# Patient Record
Sex: Male | Born: 1950
Health system: Southern US, Community
[De-identification: ages and names within clinical notes are randomized; demographics above are authoritative.]

## PROBLEM LIST (undated history)

## (undated) DIAGNOSIS — Z720 Tobacco use: Secondary | ICD-10-CM

## (undated) DIAGNOSIS — J439 Emphysema, unspecified: Secondary | ICD-10-CM

## (undated) DIAGNOSIS — I2510 Atherosclerotic heart disease of native coronary artery without angina pectoris: Secondary | ICD-10-CM

## (undated) DIAGNOSIS — F1020 Alcohol dependence, uncomplicated: Secondary | ICD-10-CM

## (undated) DIAGNOSIS — J449 Chronic obstructive pulmonary disease, unspecified: Secondary | ICD-10-CM

## (undated) DIAGNOSIS — I10 Essential (primary) hypertension: Secondary | ICD-10-CM

## (undated) DIAGNOSIS — E785 Hyperlipidemia, unspecified: Secondary | ICD-10-CM

## (undated) DIAGNOSIS — F329 Major depressive disorder, single episode, unspecified: Secondary | ICD-10-CM

## (undated) DIAGNOSIS — I219 Acute myocardial infarction, unspecified: Secondary | ICD-10-CM

## (undated) DIAGNOSIS — I639 Cerebral infarction, unspecified: Secondary | ICD-10-CM

## (undated) DIAGNOSIS — B019 Varicella without complication: Secondary | ICD-10-CM

## (undated) DIAGNOSIS — F1010 Alcohol abuse, uncomplicated: Secondary | ICD-10-CM

## (undated) DIAGNOSIS — Z9289 Personal history of other medical treatment: Secondary | ICD-10-CM

## (undated) DIAGNOSIS — I214 Non-ST elevation (NSTEMI) myocardial infarction: Secondary | ICD-10-CM

## (undated) DIAGNOSIS — K760 Fatty (change of) liver, not elsewhere classified: Secondary | ICD-10-CM

## (undated) DIAGNOSIS — F32A Depression, unspecified: Secondary | ICD-10-CM

## (undated) DIAGNOSIS — D696 Thrombocytopenia, unspecified: Secondary | ICD-10-CM

## (undated) DIAGNOSIS — A630 Anogenital (venereal) warts: Secondary | ICD-10-CM

## (undated) DIAGNOSIS — B1920 Unspecified viral hepatitis C without hepatic coma: Secondary | ICD-10-CM

## (undated) HISTORY — DX: Anogenital (venereal) warts: A63.0

## (undated) HISTORY — DX: Thrombocytopenia, unspecified: D69.6

## (undated) HISTORY — DX: Personal history of other medical treatment: Z92.89

## (undated) HISTORY — DX: Non-ST elevation (NSTEMI) myocardial infarction: I21.4

## (undated) HISTORY — DX: Fatty (change of) liver, not elsewhere classified: K76.0

## (undated) HISTORY — DX: Alcohol dependence, uncomplicated: F10.20

## (undated) HISTORY — DX: Essential (primary) hypertension: I10

## (undated) HISTORY — DX: Acute myocardial infarction, unspecified: I21.9

## (undated) HISTORY — DX: Hyperlipidemia, unspecified: E78.5

## (undated) HISTORY — DX: Cerebral infarction, unspecified: I63.9

## (undated) HISTORY — DX: Varicella without complication: B01.9

---
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
## 2010-05-15 ENCOUNTER — Inpatient Hospital Stay (INDEPENDENT_AMBULATORY_CARE_PROVIDER_SITE_OTHER)
Admission: RE | Admit: 2010-05-15 | Discharge: 2010-05-15 | Disposition: A | Discharge status: Home / Self Care | Payer: Commercial Managed Care - PPO | Source: Ambulatory Visit | Attending: Family Medicine | Admitting: Family Medicine

## 2010-05-15 ENCOUNTER — Observation Stay (HOSPITAL_COMMUNITY)
Admission: EM | Admit: 2010-05-15 | Discharge: 2010-05-16 | Disposition: A | Discharge status: Home / Self Care | Payer: Commercial Managed Care - PPO | Attending: Emergency Medicine | Admitting: Emergency Medicine

## 2010-05-15 ENCOUNTER — Emergency Department (HOSPITAL_COMMUNITY): Payer: Commercial Managed Care - PPO

## 2010-05-15 DIAGNOSIS — R0609 Other forms of dyspnea: Secondary | ICD-10-CM | POA: Insufficient documentation

## 2010-05-15 DIAGNOSIS — R5383 Other fatigue: Secondary | ICD-10-CM | POA: Insufficient documentation

## 2010-05-15 DIAGNOSIS — R5381 Other malaise: Secondary | ICD-10-CM | POA: Insufficient documentation

## 2010-05-15 DIAGNOSIS — J449 Chronic obstructive pulmonary disease, unspecified: Secondary | ICD-10-CM | POA: Insufficient documentation

## 2010-05-15 DIAGNOSIS — F329 Major depressive disorder, single episode, unspecified: Secondary | ICD-10-CM | POA: Insufficient documentation

## 2010-05-15 DIAGNOSIS — R079 Chest pain, unspecified: Principal | ICD-10-CM | POA: Insufficient documentation

## 2010-05-15 DIAGNOSIS — F3289 Other specified depressive episodes: Secondary | ICD-10-CM | POA: Insufficient documentation

## 2010-05-15 DIAGNOSIS — F17200 Nicotine dependence, unspecified, uncomplicated: Secondary | ICD-10-CM | POA: Insufficient documentation

## 2010-05-15 DIAGNOSIS — J4489 Other specified chronic obstructive pulmonary disease: Secondary | ICD-10-CM | POA: Insufficient documentation

## 2010-05-15 DIAGNOSIS — R0989 Other specified symptoms and signs involving the circulatory and respiratory systems: Secondary | ICD-10-CM | POA: Insufficient documentation

## 2010-05-15 DIAGNOSIS — N509 Disorder of male genital organs, unspecified: Secondary | ICD-10-CM

## 2010-05-15 LAB — POCT I-STAT 3, VENOUS BLOOD GAS (G3P V)
Acid-Base Excess: 2 mmol/L (ref 0.0–2.0)
Bicarbonate: 28.1 mEq/L — ABNORMAL HIGH (ref 20.0–24.0)
O2 Saturation: 76 %
TCO2: 30 mmol/L (ref 0–100)
pCO2, Ven: 49.5 mmHg (ref 45.0–50.0)
pH, Ven: 7.362 — ABNORMAL HIGH (ref 7.250–7.300)
pO2, Ven: 43 mmHg (ref 30.0–45.0)

## 2010-05-15 LAB — COMPREHENSIVE METABOLIC PANEL
ALT: 98 U/L — ABNORMAL HIGH (ref 0–53)
AST: 90 U/L — ABNORMAL HIGH (ref 0–37)
Albumin: 3.5 g/dL (ref 3.5–5.2)
Alkaline Phosphatase: 92 U/L (ref 39–117)
BUN: 16 mg/dL (ref 6–23)
CO2: 27 mEq/L (ref 19–32)
Calcium: 9.1 mg/dL (ref 8.4–10.5)
Chloride: 106 mEq/L (ref 96–112)
Creatinine, Ser: 0.89 mg/dL (ref 0.4–1.5)
GFR calc Af Amer: >60 mL/min (ref >60)
GFR calc non Af Amer: >60 mL/min (ref >60)
Glucose, Bld: 99 mg/dL (ref 70–99)
Potassium: 4.1 mEq/L (ref 3.5–5.1)
Sodium: 139 mEq/L (ref 135–145)
Total Bilirubin: 0.4 mg/dL (ref 0.3–1.2)
Total Protein: 6.9 g/dL (ref 6.0–8.3)

## 2010-05-15 LAB — TSH: TSH: 0.196 u[IU]/mL — ABNORMAL LOW (ref 0.350–4.500)

## 2010-05-15 LAB — POCT CARDIAC MARKERS
CKMB, poc: 1.9 ng/mL (ref 1.0–8.0)
CKMB, poc: 1.4 ng/mL (ref 1.0–8.0)
CKMB, poc: 1.4 ng/mL (ref 1.0–8.0)
Myoglobin, poc: 96.7 ng/mL (ref 12–200)
Myoglobin, poc: 94 ng/mL (ref 12–200)
Myoglobin, poc: 83.2 ng/mL (ref 12–200)
Troponin i, poc: <0.05 ng/mL (ref 0.00–0.09)
Troponin i, poc: <0.05 ng/mL (ref 0.00–0.09)
Troponin i, poc: <0.05 ng/mL (ref 0.00–0.09)

## 2010-05-15 LAB — CBC
HCT: 45 % (ref 39.0–52.0)
Hemoglobin: 16.2 g/dL (ref 13.0–17.0)
MCH: 35.4 pg — ABNORMAL HIGH (ref 26.0–34.0)
MCHC: 36 g/dL (ref 30.0–36.0)
MCV: 98.5 fL (ref 78.0–100.0)
Platelets: 115 10*3/uL — ABNORMAL LOW (ref 150–400)
RBC: 4.57 MIL/uL (ref 4.22–5.81)
RDW: 13.5 % (ref 11.5–15.5)
WBC: 6.2 10*3/uL (ref 4.0–10.5)

## 2010-05-16 ENCOUNTER — Encounter: Payer: Self-pay | Admitting: Internal Medicine

## 2010-05-16 DIAGNOSIS — R072 Precordial pain: Secondary | ICD-10-CM

## 2010-08-30 ENCOUNTER — Emergency Department (HOSPITAL_COMMUNITY)
Admission: EM | Admit: 2010-08-30 | Discharge: 2010-08-31 | Disposition: A | Discharge status: Home / Self Care | Payer: Commercial Managed Care - PPO | Attending: Emergency Medicine | Admitting: Emergency Medicine

## 2010-08-30 DIAGNOSIS — F329 Major depressive disorder, single episode, unspecified: Secondary | ICD-10-CM | POA: Insufficient documentation

## 2010-08-30 DIAGNOSIS — F1010 Alcohol abuse, uncomplicated: Secondary | ICD-10-CM | POA: Insufficient documentation

## 2010-08-30 DIAGNOSIS — R945 Abnormal results of liver function studies: Secondary | ICD-10-CM | POA: Insufficient documentation

## 2010-08-30 DIAGNOSIS — F3289 Other specified depressive episodes: Secondary | ICD-10-CM | POA: Insufficient documentation

## 2010-08-30 DIAGNOSIS — Z79899 Other long term (current) drug therapy: Secondary | ICD-10-CM | POA: Insufficient documentation

## 2010-08-30 LAB — URINALYSIS, ROUTINE W REFLEX MICROSCOPIC
Bilirubin Urine: NEGATIVE
Glucose, UA: NEGATIVE mg/dL
Hgb urine dipstick: NEGATIVE
Ketones, ur: NEGATIVE mg/dL
Leukocytes, UA: NEGATIVE
Nitrite: NEGATIVE
Protein, ur: NEGATIVE mg/dL
Specific Gravity, Urine: 1.017 (ref 1.005–1.030)
Urobilinogen, UA: 0.2 mg/dL (ref 0.0–1.0)
pH: 5.5 (ref 5.0–8.0)

## 2010-08-30 LAB — CBC
HCT: 43.1 % (ref 39.0–52.0)
Hemoglobin: 15.5 g/dL (ref 13.0–17.0)
MCH: 35.5 pg — ABNORMAL HIGH (ref 26.0–34.0)
MCHC: 36 g/dL (ref 30.0–36.0)
MCV: 98.6 fL (ref 78.0–100.0)
Platelets: 128 10*3/uL — ABNORMAL LOW (ref 150–400)
RBC: 4.37 MIL/uL (ref 4.22–5.81)
RDW: 13.8 % (ref 11.5–15.5)
WBC: 6.5 10*3/uL (ref 4.0–10.5)

## 2010-08-30 LAB — DIFFERENTIAL
Basophils Absolute: 0 10*3/uL (ref 0.0–0.1)
Basophils Relative: 1 % (ref 0–1)
Eosinophils Absolute: 0.2 10*3/uL (ref 0.0–0.7)
Eosinophils Relative: 3 % (ref 0–5)
Lymphocytes Relative: 43 % (ref 12–46)
Lymphs Abs: 2.8 10*3/uL (ref 0.7–4.0)
Monocytes Absolute: 0.4 10*3/uL (ref 0.1–1.0)
Monocytes Relative: 7 % (ref 3–12)
Neutro Abs: 3.1 10*3/uL (ref 1.7–7.7)
Neutrophils Relative %: 47 % (ref 43–77)

## 2010-08-30 LAB — RAPID URINE DRUG SCREEN, HOSP PERFORMED
Amphetamines: NOT DETECTED
Barbiturates: NOT DETECTED
Benzodiazepines: NOT DETECTED
Cocaine: NOT DETECTED
Opiates: NOT DETECTED
Tetrahydrocannabinol: NOT DETECTED

## 2010-08-31 LAB — COMPREHENSIVE METABOLIC PANEL
ALT: 77 U/L — ABNORMAL HIGH (ref 0–53)
AST: 91 U/L — ABNORMAL HIGH (ref 0–37)
Albumin: 3.3 g/dL — ABNORMAL LOW (ref 3.5–5.2)
Alkaline Phosphatase: 142 U/L — ABNORMAL HIGH (ref 39–117)
BUN: 10 mg/dL (ref 6–23)
CO2: 27 mEq/L (ref 19–32)
Calcium: 8.4 mg/dL (ref 8.4–10.5)
Chloride: 105 mEq/L (ref 96–112)
Creatinine, Ser: 0.85 mg/dL (ref 0.50–1.35)
GFR calc Af Amer: >60 mL/min (ref >60)
GFR calc non Af Amer: >60 mL/min (ref >60)
Glucose, Bld: 131 mg/dL — ABNORMAL HIGH (ref 70–99)
Potassium: 4.3 mEq/L (ref 3.5–5.1)
Sodium: 142 mEq/L (ref 135–145)
Total Bilirubin: 0.2 mg/dL — ABNORMAL LOW (ref 0.3–1.2)
Total Protein: 6.9 g/dL (ref 6.0–8.3)

## 2010-08-31 LAB — ETHANOL: Alcohol, Ethyl (B): 184 mg/dL — ABNORMAL HIGH (ref 0–11)

## 2011-07-16 ENCOUNTER — Emergency Department (HOSPITAL_COMMUNITY)
Admission: EM | Admit: 2011-07-16 | Discharge: 2011-07-17 | Disposition: A | Discharge status: Home / Self Care | Payer: Self-pay | Attending: Emergency Medicine | Admitting: Emergency Medicine

## 2011-07-16 ENCOUNTER — Emergency Department (HOSPITAL_COMMUNITY): Payer: Self-pay

## 2011-07-16 ENCOUNTER — Encounter (HOSPITAL_COMMUNITY): Payer: Self-pay | Admitting: Emergency Medicine

## 2011-07-16 DIAGNOSIS — F1910 Other psychoactive substance abuse, uncomplicated: Secondary | ICD-10-CM | POA: Insufficient documentation

## 2011-07-16 DIAGNOSIS — M79609 Pain in unspecified limb: Secondary | ICD-10-CM | POA: Insufficient documentation

## 2011-07-16 DIAGNOSIS — R748 Abnormal levels of other serum enzymes: Secondary | ICD-10-CM

## 2011-07-16 DIAGNOSIS — F329 Major depressive disorder, single episode, unspecified: Secondary | ICD-10-CM | POA: Insufficient documentation

## 2011-07-16 DIAGNOSIS — S42002A Fracture of unspecified part of left clavicle, initial encounter for closed fracture: Secondary | ICD-10-CM

## 2011-07-16 DIAGNOSIS — Z8619 Personal history of other infectious and parasitic diseases: Secondary | ICD-10-CM | POA: Insufficient documentation

## 2011-07-16 DIAGNOSIS — F3289 Other specified depressive episodes: Secondary | ICD-10-CM | POA: Insufficient documentation

## 2011-07-16 DIAGNOSIS — R7401 Elevation of levels of liver transaminase levels: Secondary | ICD-10-CM | POA: Insufficient documentation

## 2011-07-16 DIAGNOSIS — R7402 Elevation of levels of lactic acid dehydrogenase (LDH): Secondary | ICD-10-CM | POA: Insufficient documentation

## 2011-07-16 DIAGNOSIS — IMO0002 Reserved for concepts with insufficient information to code with codable children: Secondary | ICD-10-CM | POA: Insufficient documentation

## 2011-07-16 DIAGNOSIS — S066XAA Traumatic subarachnoid hemorrhage with loss of consciousness status unknown, initial encounter: Secondary | ICD-10-CM | POA: Insufficient documentation

## 2011-07-16 DIAGNOSIS — S42009A Fracture of unspecified part of unspecified clavicle, initial encounter for closed fracture: Secondary | ICD-10-CM | POA: Insufficient documentation

## 2011-07-16 DIAGNOSIS — S0990XA Unspecified injury of head, initial encounter: Secondary | ICD-10-CM

## 2011-07-16 DIAGNOSIS — F1911 Other psychoactive substance abuse, in remission: Secondary | ICD-10-CM

## 2011-07-16 DIAGNOSIS — S066X9A Traumatic subarachnoid hemorrhage with loss of consciousness of unspecified duration, initial encounter: Secondary | ICD-10-CM

## 2011-07-16 DIAGNOSIS — R040 Epistaxis: Secondary | ICD-10-CM | POA: Insufficient documentation

## 2011-07-16 DIAGNOSIS — R911 Solitary pulmonary nodule: Secondary | ICD-10-CM | POA: Insufficient documentation

## 2011-07-16 HISTORY — DX: Major depressive disorder, single episode, unspecified: F32.9

## 2011-07-16 HISTORY — DX: Depression, unspecified: F32.A

## 2011-07-16 HISTORY — DX: Unspecified viral hepatitis C without hepatic coma: B19.20

## 2011-07-16 LAB — URINALYSIS, ROUTINE W REFLEX MICROSCOPIC
Bilirubin Urine: NEGATIVE
Glucose, UA: NEGATIVE mg/dL
Hgb urine dipstick: NEGATIVE
Ketones, ur: NEGATIVE mg/dL
Leukocytes, UA: NEGATIVE
Nitrite: NEGATIVE
Protein, ur: NEGATIVE mg/dL
Specific Gravity, Urine: 1.03 (ref 1.005–1.030)
Urobilinogen, UA: 0.2 mg/dL (ref 0.0–1.0)
pH: 5 (ref 5.0–8.0)

## 2011-07-16 LAB — CBC
HCT: 44.7 % (ref 39.0–52.0)
Hemoglobin: 16.1 g/dL (ref 13.0–17.0)
MCH: 36.2 pg — ABNORMAL HIGH (ref 26.0–34.0)
MCHC: 36 g/dL (ref 30.0–36.0)
MCV: 100.4 fL — ABNORMAL HIGH (ref 78.0–100.0)
Platelets: 105 10*3/uL — ABNORMAL LOW (ref 150–400)
RBC: 4.45 MIL/uL (ref 4.22–5.81)
RDW: 13.4 % (ref 11.5–15.5)
WBC: 6.3 10*3/uL (ref 4.0–10.5)

## 2011-07-16 LAB — COMPREHENSIVE METABOLIC PANEL
ALT: 180 U/L — ABNORMAL HIGH (ref 0–53)
AST: 186 U/L — ABNORMAL HIGH (ref 0–37)
Albumin: 3.2 g/dL — ABNORMAL LOW (ref 3.5–5.2)
Alkaline Phosphatase: 130 U/L — ABNORMAL HIGH (ref 39–117)
BUN: 11 mg/dL (ref 6–23)
CO2: 20 mEq/L (ref 19–32)
Calcium: 8.3 mg/dL — ABNORMAL LOW (ref 8.4–10.5)
Chloride: 106 mEq/L (ref 96–112)
Creatinine, Ser: 0.86 mg/dL (ref 0.50–1.35)
GFR calc Af Amer: >90 mL/min (ref >90)
GFR calc non Af Amer: >90 mL/min (ref >90)
Glucose, Bld: 115 mg/dL — ABNORMAL HIGH (ref 70–99)
Potassium: 4.1 mEq/L (ref 3.5–5.1)
Sodium: 139 mEq/L (ref 135–145)
Total Bilirubin: 0.5 mg/dL (ref 0.3–1.2)
Total Protein: 7.1 g/dL (ref 6.0–8.3)

## 2011-07-16 LAB — DIFFERENTIAL
Basophils Absolute: 0 10*3/uL (ref 0.0–0.1)
Basophils Relative: 1 % (ref 0–1)
Eosinophils Absolute: 0.1 10*3/uL (ref 0.0–0.7)
Eosinophils Relative: 2 % (ref 0–5)
Lymphocytes Relative: 54 % — ABNORMAL HIGH (ref 12–46)
Lymphs Abs: 3.4 10*3/uL (ref 0.7–4.0)
Monocytes Absolute: 0.4 10*3/uL (ref 0.1–1.0)
Monocytes Relative: 6 % (ref 3–12)
Neutro Abs: 2.3 10*3/uL (ref 1.7–7.7)
Neutrophils Relative %: 37 % — ABNORMAL LOW (ref 43–77)

## 2011-07-16 LAB — GLUCOSE, CAPILLARY: Glucose-Capillary: 117 mg/dL — ABNORMAL HIGH (ref 70–99)

## 2011-07-16 MED ORDER — OXYCODONE-ACETAMINOPHEN 5-325 MG PO TABS
2.0000 | ORAL_TABLET | Freq: Once | ORAL | Status: AC
  Administered 2011-07-16: 2 via ORAL
  Filled 2011-07-16: qty 2

## 2011-07-16 MED ORDER — IOHEXOL 300 MG/ML  SOLN
100.0000 mL | Freq: Once | INTRAMUSCULAR | Status: AC | PRN
  Administered 2011-07-16: 100 mL via INTRAVENOUS

## 2011-07-16 MED ORDER — TRAMADOL HCL 50 MG PO TABS: 50.0000 mg | ORAL_TABLET | Freq: Four times a day (QID) | ORAL | Status: DC | PRN

## 2011-07-16 MED ORDER — TRAMADOL HCL 50 MG PO TABS: 50.0000 mg | ORAL_TABLET | Freq: Four times a day (QID) | ORAL | Status: AC | PRN

## 2011-07-16 MED ORDER — TETANUS-DIPHTH-ACELL PERTUSSIS 5-2.5-18.5 LF-MCG/0.5 IM SUSP
0.5000 mL | Freq: Once | INTRAMUSCULAR | Status: AC
  Administered 2011-07-16: 0.5 mL via INTRAMUSCULAR
  Filled 2011-07-16: qty 0.5

## 2011-07-16 NOTE — Progress Notes (Signed)
Orthopedic Tech Progress Note Patient Details:  Hunter Mcmillan 1950/04/14 [id number]  Patient ID: [name], male   DOB: 1951-02-02, 61 y.o.   MRN: [id number] Made trauma visit  Nikki Dom 07/16/2011, 7:10 PM

## 2011-07-16 NOTE — ED Notes (Signed)
PT returned from CT. [employer] at bedside; pt alert and oriented; no signs of distress.

## 2011-07-16 NOTE — ED Notes (Signed)
LAC noted to forehead
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
## 2011-07-16 NOTE — ED Notes (Signed)
Ortho paged. 

## 2011-07-16 NOTE — ED Notes (Signed)
Patient transported to CT 

## 2011-07-16 NOTE — Discharge Instructions (Signed)
You have had a head injury which does not appear to require admission at this time. A concussion is a state of changed mental ability from trauma. SEEK IMMEDIATE MEDICAL ATTENTION IF: There is confusion or drowsiness (although children frequently become drowsy after injury).  You cannot awaken the injured person.  There is nausea (feeling sick to your stomach) or continued, forceful vomiting.  You notice dizziness or unsteadiness which is getting worse, or inability to walk.  You have convulsions or unconsciousness.  (Do not take aspirin as this impairs clotting abilities). Take other pain medications only as directed.  You cannot use arms or legs normally.  There are changes in pupil sizes. (This is the black center in the colored part of the eye)  There is clear or bloody discharge from the nose or ears.  Change in speech, vision, swallowing, or understanding.  Localized weakness, numbness, tingling, or change in bowel or bladder control.

## 2011-07-16 NOTE — ED Notes (Signed)
PT attempted to provide urine sample; pt became verbally inappropriate with staff. Urinal left with pt.

## 2012-01-02 DIAGNOSIS — I2510 Atherosclerotic heart disease of native coronary artery without angina pectoris: Secondary | ICD-10-CM

## 2012-01-02 HISTORY — DX: Atherosclerotic heart disease of native coronary artery without angina pectoris: I25.10

## 2012-01-05 ENCOUNTER — Inpatient Hospital Stay (HOSPITAL_COMMUNITY)
Admission: EM | Admit: 2012-01-05 | Discharge: 2012-01-07 | DRG: 282 | Disposition: A | Discharge status: Home / Self Care | Payer: Commercial Managed Care - PPO | Attending: Cardiology | Admitting: Cardiology

## 2012-01-05 ENCOUNTER — Inpatient Hospital Stay (HOSPITAL_COMMUNITY): Payer: Self-pay

## 2012-01-05 ENCOUNTER — Encounter (HOSPITAL_COMMUNITY): Payer: Self-pay | Admitting: *Deleted

## 2012-01-05 ENCOUNTER — Emergency Department (HOSPITAL_COMMUNITY): Payer: Self-pay

## 2012-01-05 ENCOUNTER — Encounter (HOSPITAL_COMMUNITY)
Admission: EM | Disposition: A | Discharge status: Home / Self Care | Payer: Self-pay | Source: Home / Self Care | Attending: Cardiology

## 2012-01-05 DIAGNOSIS — I252 Old myocardial infarction: Secondary | ICD-10-CM

## 2012-01-05 DIAGNOSIS — F1010 Alcohol abuse, uncomplicated: Secondary | ICD-10-CM | POA: Diagnosis present

## 2012-01-05 DIAGNOSIS — F339 Major depressive disorder, recurrent, unspecified: Secondary | ICD-10-CM

## 2012-01-05 DIAGNOSIS — Z8249 Family history of ischemic heart disease and other diseases of the circulatory system: Secondary | ICD-10-CM

## 2012-01-05 DIAGNOSIS — R079 Chest pain, unspecified: Secondary | ICD-10-CM

## 2012-01-05 DIAGNOSIS — B182 Chronic viral hepatitis C: Secondary | ICD-10-CM

## 2012-01-05 DIAGNOSIS — I2510 Atherosclerotic heart disease of native coronary artery without angina pectoris: Secondary | ICD-10-CM

## 2012-01-05 DIAGNOSIS — F3289 Other specified depressive episodes: Secondary | ICD-10-CM | POA: Diagnosis present

## 2012-01-05 DIAGNOSIS — Z720 Tobacco use: Secondary | ICD-10-CM

## 2012-01-05 DIAGNOSIS — F329 Major depressive disorder, single episode, unspecified: Secondary | ICD-10-CM | POA: Diagnosis present

## 2012-01-05 DIAGNOSIS — D696 Thrombocytopenia, unspecified: Secondary | ICD-10-CM

## 2012-01-05 DIAGNOSIS — I214 Non-ST elevation (NSTEMI) myocardial infarction: Principal | ICD-10-CM

## 2012-01-05 DIAGNOSIS — B1920 Unspecified viral hepatitis C without hepatic coma: Secondary | ICD-10-CM | POA: Diagnosis present

## 2012-01-05 DIAGNOSIS — F17200 Nicotine dependence, unspecified, uncomplicated: Secondary | ICD-10-CM | POA: Diagnosis present

## 2012-01-05 HISTORY — PX: LEFT HEART CATHETERIZATION WITH CORONARY ANGIOGRAM: SHX5451

## 2012-01-05 HISTORY — DX: Alcohol abuse, uncomplicated: F10.10

## 2012-01-05 HISTORY — DX: Atherosclerotic heart disease of native coronary artery without angina pectoris: I25.10

## 2012-01-05 HISTORY — DX: Tobacco use: Z72.0

## 2012-01-05 LAB — URINALYSIS, ROUTINE W REFLEX MICROSCOPIC
Glucose, UA: NEGATIVE mg/dL
Hgb urine dipstick: NEGATIVE
Ketones, ur: 15 mg/dL — AB
Leukocytes, UA: NEGATIVE
Nitrite: NEGATIVE
Protein, ur: NEGATIVE mg/dL
Specific Gravity, Urine: >1.046 — ABNORMAL HIGH (ref 1.005–1.030)
Urobilinogen, UA: 1 mg/dL (ref 0.0–1.0)
pH: 5 (ref 5.0–8.0)

## 2012-01-05 LAB — HEPATIC FUNCTION PANEL
ALT: 157 U/L — ABNORMAL HIGH (ref 0–53)
AST: 157 U/L — ABNORMAL HIGH (ref 0–37)
Albumin: 3.3 g/dL — ABNORMAL LOW (ref 3.5–5.2)
Alkaline Phosphatase: 109 U/L (ref 39–117)
Bilirubin, Direct: 0.2 mg/dL (ref 0.0–0.3)
Indirect Bilirubin: 0.3 mg/dL (ref 0.3–0.9)
Total Bilirubin: 0.5 mg/dL (ref 0.3–1.2)
Total Protein: 7.8 g/dL (ref 6.0–8.3)

## 2012-01-05 LAB — POCT I-STAT TROPONIN I: Troponin i, poc: 0.13 ng/mL (ref 0.00–0.08)

## 2012-01-05 LAB — TROPONIN I
Troponin I: 0.36 ng/mL (ref <0.30)
Troponin I: 0.32 ng/mL (ref <0.30)

## 2012-01-05 LAB — CBC
HCT: 45.8 % (ref 39.0–52.0)
Hemoglobin: 16.8 g/dL (ref 13.0–17.0)
MCH: 36.6 pg — ABNORMAL HIGH (ref 26.0–34.0)
MCHC: 36.7 g/dL — ABNORMAL HIGH (ref 30.0–36.0)
MCV: 99.8 fL (ref 78.0–100.0)
Platelets: 100 10*3/uL — ABNORMAL LOW (ref 150–400)
RBC: 4.59 MIL/uL (ref 4.22–5.81)
RDW: 13.5 % (ref 11.5–15.5)
WBC: 5 10*3/uL (ref 4.0–10.5)

## 2012-01-05 LAB — BASIC METABOLIC PANEL
BUN: 7 mg/dL (ref 6–23)
CO2: 25 mEq/L (ref 19–32)
Calcium: 9.2 mg/dL (ref 8.4–10.5)
Chloride: 102 mEq/L (ref 96–112)
Creatinine, Ser: 0.78 mg/dL (ref 0.50–1.35)
GFR calc Af Amer: >90 mL/min (ref >90)
GFR calc non Af Amer: >90 mL/min (ref >90)
Glucose, Bld: 141 mg/dL — ABNORMAL HIGH (ref 70–99)
Potassium: 4.4 mEq/L (ref 3.5–5.1)
Sodium: 138 mEq/L (ref 135–145)

## 2012-01-05 LAB — RAPID URINE DRUG SCREEN, HOSP PERFORMED
Amphetamines: NOT DETECTED
Barbiturates: NOT DETECTED
Benzodiazepines: NOT DETECTED
Cocaine: NOT DETECTED
Opiates: POSITIVE — AB
Tetrahydrocannabinol: NOT DETECTED

## 2012-01-05 LAB — PROTIME-INR
INR: 1.06 (ref 0.00–1.49)
Prothrombin Time: 13.7 seconds (ref 11.6–15.2)

## 2012-01-05 LAB — ETHANOL: Alcohol, Ethyl (B): 90 mg/dL — ABNORMAL HIGH (ref 0–11)

## 2012-01-05 LAB — GLUCOSE, CAPILLARY: Glucose-Capillary: 123 mg/dL — ABNORMAL HIGH (ref 70–99)

## 2012-01-05 LAB — MRSA PCR SCREENING: MRSA by PCR: NEGATIVE

## 2012-01-05 SURGERY — LEFT HEART CATHETERIZATION WITH CORONARY ANGIOGRAM
Anesthesia: LOCAL

## 2012-01-05 MED ORDER — SODIUM CHLORIDE 0.9 % IJ SOLN
3.0000 mL | Freq: Two times a day (BID) | INTRAMUSCULAR | Status: DC
  Administered 2012-01-06 – 2012-01-07 (×3): 3 mL via INTRAVENOUS

## 2012-01-05 MED ORDER — LORAZEPAM 2 MG/ML IJ SOLN: 1.0000 mg | Freq: Four times a day (QID) | INTRAMUSCULAR | Status: DC | PRN

## 2012-01-05 MED ORDER — ATORVASTATIN CALCIUM 10 MG PO TABS
10.0000 mg | ORAL_TABLET | Freq: Every day | ORAL | Status: DC
  Administered 2012-01-05 – 2012-01-06 (×2): 10 mg via ORAL
  Filled 2012-01-05 (×3): qty 1

## 2012-01-05 MED ORDER — LORAZEPAM 0.5 MG PO TABS: 1.0000 mg | ORAL_TABLET | Freq: Four times a day (QID) | ORAL | Status: DC | PRN

## 2012-01-05 MED ORDER — LIDOCAINE HCL (PF) 1 % IJ SOLN
INTRAMUSCULAR | Status: AC
  Filled 2012-01-05: qty 30

## 2012-01-05 MED ORDER — MORPHINE SULFATE 4 MG/ML IJ SOLN
4.0000 mg | Freq: Once | INTRAMUSCULAR | Status: AC
  Administered 2012-01-05: 4 mg via INTRAVENOUS
  Filled 2012-01-05: qty 1

## 2012-01-05 MED ORDER — ACETAMINOPHEN 325 MG PO TABS: 650.0000 mg | ORAL_TABLET | ORAL | Status: DC | PRN

## 2012-01-05 MED ORDER — SODIUM CHLORIDE 0.9 % IV SOLN: INTRAVENOUS | Status: AC

## 2012-01-05 MED ORDER — SODIUM CHLORIDE 0.9 % IJ SOLN: 3.0000 mL | INTRAMUSCULAR | Status: DC | PRN

## 2012-01-05 MED ORDER — ONDANSETRON HCL 4 MG/2ML IJ SOLN: 4.0000 mg | Freq: Four times a day (QID) | INTRAMUSCULAR | Status: DC | PRN

## 2012-01-05 MED ORDER — ASPIRIN EC 81 MG PO TBEC
81.0000 mg | DELAYED_RELEASE_TABLET | Freq: Every day | ORAL | Status: DC
  Administered 2012-01-06 – 2012-01-07 (×2): 81 mg via ORAL
  Filled 2012-01-05 (×2): qty 1

## 2012-01-05 MED ORDER — VERAPAMIL HCL 2.5 MG/ML IV SOLN
INTRAVENOUS | Status: AC
  Filled 2012-01-05: qty 2

## 2012-01-05 MED ORDER — THIAMINE HCL 100 MG/ML IJ SOLN
100.0000 mg | Freq: Every day | INTRAMUSCULAR | Status: DC
  Filled 2012-01-05 (×3): qty 1

## 2012-01-05 MED ORDER — ONDANSETRON HCL 4 MG/2ML IJ SOLN
4.0000 mg | Freq: Once | INTRAMUSCULAR | Status: AC
  Administered 2012-01-05: 4 mg via INTRAVENOUS
  Filled 2012-01-05: qty 2

## 2012-01-05 MED ORDER — HEPARIN (PORCINE) IN NACL 100-0.45 UNIT/ML-% IJ SOLN
900.0000 [IU]/h | INTRAMUSCULAR | Status: DC
  Filled 2012-01-05: qty 250

## 2012-01-05 MED ORDER — SODIUM CHLORIDE 0.9 % IV SOLN: 250.0000 mL | INTRAVENOUS | Status: DC | PRN

## 2012-01-05 MED ORDER — NITROGLYCERIN 0.2 MG/ML ON CALL CATH LAB
INTRAVENOUS | Status: AC
  Filled 2012-01-05: qty 1

## 2012-01-05 MED ORDER — MIDAZOLAM HCL 2 MG/2ML IJ SOLN
INTRAMUSCULAR | Status: AC
  Filled 2012-01-05: qty 2

## 2012-01-05 MED ORDER — CLOPIDOGREL BISULFATE 75 MG PO TABS
75.0000 mg | ORAL_TABLET | Freq: Every day | ORAL | Status: DC
  Administered 2012-01-06 – 2012-01-07 (×2): 75 mg via ORAL
  Filled 2012-01-05 (×3): qty 1

## 2012-01-05 MED ORDER — SODIUM CHLORIDE 0.9 % IV BOLUS (SEPSIS)
500.0000 mL | Freq: Once | INTRAVENOUS | Status: AC
  Administered 2012-01-05: 500 mL via INTRAVENOUS

## 2012-01-05 MED ORDER — HEPARIN (PORCINE) IN NACL 100-0.45 UNIT/ML-% IJ SOLN
900.0000 [IU]/h | INTRAMUSCULAR | Status: DC
  Administered 2012-01-06: 900 [IU]/h via INTRAVENOUS
  Filled 2012-01-05: qty 250

## 2012-01-05 MED ORDER — FENTANYL CITRATE 0.05 MG/ML IJ SOLN
INTRAMUSCULAR | Status: AC
  Filled 2012-01-05: qty 2

## 2012-01-05 MED ORDER — NITROGLYCERIN 0.4 MG SL SUBL
0.4000 mg | SUBLINGUAL_TABLET | SUBLINGUAL | Status: AC | PRN
  Administered 2012-01-05 (×3): 0.4 mg via SUBLINGUAL
  Filled 2012-01-05 (×2): qty 25

## 2012-01-05 MED ORDER — VITAMIN B-1 100 MG PO TABS
100.0000 mg | ORAL_TABLET | Freq: Every day | ORAL | Status: DC
  Administered 2012-01-05 – 2012-01-07 (×3): 100 mg via ORAL
  Filled 2012-01-05 (×3): qty 1

## 2012-01-05 MED ORDER — ADULT MULTIVITAMIN W/MINERALS CH
1.0000 | ORAL_TABLET | Freq: Every day | ORAL | Status: DC
  Administered 2012-01-05 – 2012-01-07 (×3): 1 via ORAL
  Filled 2012-01-05 (×3): qty 1

## 2012-01-05 MED ORDER — SODIUM CHLORIDE 0.9 % IJ SOLN
3.0000 mL | Freq: Two times a day (BID) | INTRAMUSCULAR | Status: DC
  Administered 2012-01-06: 3 mL via INTRAVENOUS

## 2012-01-05 MED ORDER — CLOPIDOGREL BISULFATE 300 MG PO TABS
ORAL_TABLET | ORAL | Status: AC
  Filled 2012-01-05: qty 1

## 2012-01-05 MED ORDER — CLOPIDOGREL BISULFATE 300 MG PO TABS
300.0000 mg | ORAL_TABLET | Freq: Once | ORAL | Status: AC
  Administered 2012-01-05: 300 mg via ORAL
  Filled 2012-01-05: qty 1

## 2012-01-05 MED ORDER — FOLIC ACID 1 MG PO TABS
1.0000 mg | ORAL_TABLET | Freq: Every day | ORAL | Status: DC
Start: 2012-01-05 — End: 2012-01-07
  Administered 2012-01-05 – 2012-01-07 (×3): 1 mg via ORAL
  Filled 2012-01-05 (×3): qty 1

## 2012-01-05 MED ORDER — ASPIRIN 81 MG PO CHEW
324.0000 mg | CHEWABLE_TABLET | Freq: Once | ORAL | Status: AC
  Administered 2012-01-05: 324 mg via ORAL
  Filled 2012-01-05: qty 1

## 2012-01-05 MED ORDER — ACETAMINOPHEN 325 MG PO TABS
650.0000 mg | ORAL_TABLET | ORAL | Status: DC | PRN
  Administered 2012-01-06: 650 mg via ORAL
  Filled 2012-01-05: qty 2

## 2012-01-05 MED ORDER — HEPARIN SODIUM (PORCINE) 1000 UNIT/ML IJ SOLN
INTRAMUSCULAR | Status: AC
  Filled 2012-01-05: qty 1

## 2012-01-05 MED ORDER — NICOTINE 14 MG/24HR TD PT24
14.0000 mg | MEDICATED_PATCH | Freq: Every day | TRANSDERMAL | Status: DC
  Administered 2012-01-06 – 2012-01-07 (×2): 14 mg via TRANSDERMAL
  Filled 2012-01-05 (×2): qty 1

## 2012-01-05 MED ORDER — NITROGLYCERIN 0.4 MG SL SUBL: 0.4000 mg | SUBLINGUAL_TABLET | SUBLINGUAL | Status: DC | PRN

## 2012-01-05 NOTE — CV Procedure (Addendum)
   Cardiac Catheterization Procedure Note  Name: Hunter Mcmillan MRN: [id number] DOB: 1950-10-03  Procedure: Left Heart Cath, Selective Coronary Angiography, LV angiography  Indication: NSTEMI   Procedural Details: The right wrist was prepped, draped, and anesthetized with 1% lidocaine. Using the modified Seldinger technique, a 5 French sheath was introduced into the right radial artery. 3 mg of verapamil was administered through the sheath, weight-based unfractionated heparin was administered intravenously. Standard Judkins catheters were used for selective coronary angiography and left ventriculography. Catheter exchanges were performed over an exchange length guidewire. There were no immediate procedural complications. A TR band was used for radial hemostasis at the completion of the procedure.  The patient was transferred to the post catheterization recovery area for further monitoring.  Procedural Findings: Hemodynamics: AO 107/60 LV 120/15  Coronary angiography: Coronary dominance: right  Left mainstem: LM is same as LAD given anomalous LCx off the RCA.   Left anterior descending (LAD): 20% ostial stenosis, 30% proximal stenosis, 30% mid LAD stenosis.   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  Small caliber vessel with hazy 95% proximal stenosis.  TIMI 3 flow down vessel.   Right coronary artery (RCA): 30% proximal, 30% mid RCA stenosis.  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  I reviewed the films with Dr. [place].  The patient is no longer having chest pain and the vessel is quite small in caliber.  We planned for medical management.  Will start Plavix and will treat with heparin gtt for 24-48 hours.    [name] 01/05/2012, 4:42 PM

## 2012-01-05 NOTE — Progress Notes (Signed)
Attempted to release 3ccs of air from TR band at 1700 per protocol. Large amount of bleeding noted to site. Pt states he has an interferon issue and does not clot well. Attempted again at 1800 to release air but bleeding noted again. Will continue to monitor

## 2012-01-05 NOTE — ED Notes (Signed)
Pt is here with chest pain that started this 0300 this am reports right shoulder and arm pain.  Pt states pain is severe.  Pt states everything hurts

## 2012-01-05 NOTE — ED Notes (Signed)
Pt to Cath Lab with RN

## 2012-01-05 NOTE — ED Provider Notes (Signed)
History     CSN: [id number]  Arrival date & time 01/05/12  1057   First MD Initiated Contact with Patient 01/05/12 1144      Chief Complaint  Patient presents with  . Chest Pain    (Consider location/radiation/quality/duration/timing/severity/associated sxs/prior treatment) HPI  Hunter Mcmillan is a 61 y.o. male complaining of bilateral anterior chest pain that radiates to right shoulder,  that woke him from sleep at 3:00 AM today. Pain is 8/10 at worst and 3/10 now. Described as an elephant on his chest. Pt took 2 of mothers SL NTG with relief. Pain is associated with nausea and HA. Pain has been constant, non-exertional, non-pleuritic or positional. Denies SOB, vomitting, diaphoresis, cough, fever, cough, back pain, syncope, prior episodes. Denies recent travel, leg swelling, hemoptysis.  Pt has not received any ASA last 24 hours. Patient denies drinking any alcohol today.  RF: Mother had 1st MI at 53, current smoker ~30 pack years Cath: never Last Stress test: Stress Echo on 05/16/2010 -> no abnormalities Cardiologost: None  PCP: None  Past Medical History  Diagnosis Date  . Depression   . Hepatitis C     History reviewed. No pertinent past surgical history.  No family history on file.  History  Substance Use Topics  . Smoking status: Current Every Day Smoker  . Smokeless tobacco: Not on file  . Alcohol Use: Yes     Comment: former      Review of Systems  Constitutional: Negative for fever.  Respiratory: Negative for shortness of breath.   Cardiovascular: Positive for chest pain.  Gastrointestinal: Positive for nausea. Negative for vomiting, abdominal pain and diarrhea.  All other systems reviewed and are negative.    Allergies  Review of patient's allergies indicates no known allergies.  Home Medications   Current Outpatient Rx  Name  Route  Sig  Dispense  Refill  . CITALOPRAM HYDROBROMIDE 20 MG PO TABS   Oral   Take 20 mg by mouth daily at 12  noon.         . ADULT MULTIVITAMIN W/MINERALS CH   Oral   Take 1 tablet by mouth daily.           BP 106/85  Pulse 104  Resp 22  SpO2 99%  Physical Exam  Nursing note and vitals reviewed. Constitutional: He is oriented to person, place, and time. He appears well-developed and well-nourished. No distress.       Patient has slow speech suspect EtOH intoxication  HENT:  Head: Normocephalic and atraumatic.  Mouth/Throat: Oropharynx is clear and moist.  Eyes: Conjunctivae normal and EOM are normal. Pupils are equal, round, and reactive to light.  Cardiovascular: Normal rate, regular rhythm, normal heart sounds and intact distal pulses.   Pulmonary/Chest: Effort normal and breath sounds normal. No stridor. No respiratory distress. He has no wheezes. He has no rales. He exhibits no tenderness.  Abdominal: Soft. Bowel sounds are normal. He exhibits no distension and no mass. There is no tenderness. There is no rebound and no guarding.  Musculoskeletal: Normal range of motion.  Neurological: He is alert and oriented to person, place, and time.  Psychiatric: He has a normal mood and affect.    ED Course  Procedures (including critical care time)  Labs Reviewed  CBC - Abnormal; Notable for the following:    MCH 36.6 (*)     MCHC 36.7 (*)     Platelets 100 (*)  PLATELET COUNT CONFIRMED BY SMEAR   All  other components within normal limits  BASIC METABOLIC PANEL - Abnormal; Notable for the following:    Glucose, Bld 141 (*)     All other components within normal limits  GLUCOSE, CAPILLARY - Abnormal; Notable for the following:    Glucose-Capillary 123 (*)     All other components within normal limits  HEPATIC FUNCTION PANEL - Abnormal; Notable for the following:    Albumin 3.3 (*)     AST 157 (*)     ALT 157 (*)     All other components within normal limits  URINALYSIS, ROUTINE W REFLEX MICROSCOPIC - Abnormal; Notable for the following:    Color, Urine AMBER (*)  BIOCHEMICALS  MAY BE AFFECTED BY COLOR   Specific Gravity, Urine >1.046 (*)  REPEATED TO VERIFY   Bilirubin Urine SMALL (*)     Ketones, ur 15 (*)     All other components within normal limits  URINE RAPID DRUG SCREEN (HOSP PERFORMED) - Abnormal; Notable for the following:    Opiates POSITIVE (*)     All other components within normal limits  ETHANOL - Abnormal; Notable for the following:    Alcohol, Ethyl (B) 90 (*)     All other components within normal limits  POCT I-STAT TROPONIN I - Abnormal; Notable for the following:    Troponin i, poc 0.13 (*)     All other components within normal limits  TROPONIN I   Dg Chest 2 View  01/05/2012  *RADIOLOGY REPORT*  Clinical Data: Chest pain.  CHEST - 2 VIEW  Comparison: One-view chest 07/16/2011.  Findings: The heart size is normal.  Slight rightward curvature of the thoracic spine is noted.  Mild interstitial coarsening is stable.  No focal airspace disease is evident.  No edema or effusion is present.  Degenerative changes are present in the thoracic spine with exaggerated kyphosis.  No focal fracture is evident.  IMPRESSION:  1.  Mild chronic interstitial changes. 2.  No acute abnormality. 3.  Exaggerated thoracic kyphosis.   Original Report Authenticated By: Marin Roberts, M.D.     Date: 01/05/2012  Rate: 89  Rhythm: normal sinus rhythm  QRS Axis: normal  Intervals: normal  ST/T Wave abnormalities: nonspecific ST/T changes  Conduction Disutrbances:none  Narrative Interpretation: mild non-specific ST depression in lateral leads  Old EKG Reviewed: unchanged  1. Chest pain       MDM  EKG is nonischemic with nonspecific ST depression in the lateral leads. Chest x-ray shows no acute abnormalities. Troponin is mildly elevated at 0.13.  Blood work is significant for a thrombocytopenia of 100, this is not unusual based on his prior readings.   12:59 PM patient still having chest pain I will write him for morphine in addition to the  nitroglycerin  2:09 PM patient is chest pain-free at this time. Liver function tests are mildly elevated not out of range for his baseline. Patient has elevated EtOH at 90.   Internal medicine consult by Dr. Kizzie Bane appreciated: He pleases patient may be appropriate for catheterization and has asked me to consult cardiology. As per Dr. Corey Harold he spoken with cardiologist Dr. Shirlee Latch and he will be admitted to the cardiology service.    Wynetta Emery, PA-C 01/05/12 1502

## 2012-01-05 NOTE — H&P (Signed)
History and Physical   Patient ID: Hunter Mcmillan MRN: [id number], DOB/AGE: [birth year]   Admit date: 01/05/2012 Date of Consult: 01/05/2012   Primary Physician: Sheila Oats, MD Primary Cardiologist: New to cardiology   HPI: Hunter Mcmillan is a 62yo male with PMHx s/f depression, EtOH and tobacco abuse, family history of CAD and hep C (s/p interferon/rabdoviron stopped d/t neutropenia) who presents to [facility] ED w/ chest pain.   He reports experiencing constant chest discomfort ~ 3 AM this morning described as "an elephant sitting on my chest" which persisted throughout the morning. He took NTG SL x 2 w/o relief. He reports prior chest pressure last year, and underwent a stress test which was normal. No recurrent incidents.  Reports mother had an MI at 82. + tobacco abuse: 1/2 PPD. EtOH abuse- used to drink 6-8 beers daily, now 2-3 on weekends. Denies EtOH today.   There, EKG revealed subtle inferolateral changes. Initial trop-I mildly elevated. AST/ALT are mildly elevated. The pain persisted initially, but eventually dissipated with multiple NTG and MSO4. U/A unremarkable. UDS + for opiates (received morphine). Ethyl alcohol 90. CBC & BMET unremarkable. CXR reveals mild chronic interstitial changes, no acute abnormality and thoracic kyphosis.   Problem List: Past Medical History  Diagnosis Date  . Depression   . Hepatitis C   . ETOH abuse   . Tobacco abuse     History reviewed. No pertinent past surgical history.   Allergies: No Known Allergies  Home Medications: Prior to Admission medications   Medication Sig Start Date End Date Taking? Authorizing Provider  citalopram (CELEXA) 20 MG tablet Take 20 mg by mouth daily at 12 noon.   Yes Historical Provider, MD  Multiple Vitamin (MULTIVITAMIN WITH MINERALS) TABS Take 1 tablet by mouth daily.   Yes Historical Provider, MD    Inpatient Medications:     . [COMPLETED] aspirin  324 mg Oral Once  . [COMPLETED] lidocaine      .  [COMPLETED]  morphine injection  4 mg Intravenous Once  . [COMPLETED] nitroGLYCERIN      . [COMPLETED] ondansetron (ZOFRAN) IV  4 mg Intravenous Once    (Not in a hospital admission)  Family History  Problem Relation Age of Onset  . Heart attack Mother 57     History   Social History  . Marital Status: Single    Spouse Name: N/A    Number of Children: N/A  . Years of Education: N/A   Occupational History  . Curator     retired   Social History Main Topics  . Smoking status: Current Every Day Smoker -- 0.5 packs/day    Types: Cigarettes  . Smokeless tobacco: Not on file  . Alcohol Use: Yes     Comment: 2-3 beers on weekends, previous 6-8 beers daily  . Drug Use: No  . Sexually Active: Not on file   Other Topics Concern  . Not on file   Social History Narrative  . No narrative on file     Review of Systems: General:  negative for chills, fever, night sweats or weight changes.  Cardiovascular: positive for chest pain, negative for dyspnea on exertion, edema, orthopnea, palpitations, paroxysmal nocturnal dyspnea or shortness of breath Dermatological: negative for rash Respiratory:  negative for cough or wheezing Urologic: negative for hematuria Abdominal:  negative for nausea, vomiting, diarrhea, bright red blood per rectum, melena, or hematemesis Neurologic: negative for visual changes, syncope, or dizziness All other systems reviewed and are otherwise negative except as  noted above.  Physical Exam: Blood pressure 109/72, pulse 66, resp. rate 18, SpO2 96.00%.    General:  Well developed, well nourished, in no acute distress.   Head:  Normocephalic, atraumatic, sclera non-icteric, no xanthomas, nares are without discharge.  Neck:  Negative for carotid bruits. JVD not elevated. Lungs:  Clear bilaterally to auscultation without wheezes, rales, or rhonchi. Breathing is unlabored. Heart:  RRR with S1 S2. No murmurs, rubs, or gallops appreciated. Abdomen:  Soft,  non-tender, non-distended with normoactive bowel sounds. No hepatomegaly. No rebound/guarding. No obvious abdominal masses. Msk:   Strength and tone appears normal for age. Extremities:  No clubbing, cyanosis or edema.  Distal pedal pulses are 2+ and equal bilaterally. Neuro:  Alert and oriented X 3. Moves all extremities spontaneously. Speech is slurred.  Psych:   Responds to questions appropriately with a normal affect.  Labs: Recent Labs  Basename 01/05/12 1102   WBC 5.0   HGB 16.8   HCT 45.8   MCV 99.8   PLT 100*    Lab 01/05/12 1240 01/05/12 1102  NA -- 138  K -- 4.4  CL -- 102  CO2 -- 25  BUN -- 7  CREATININE -- 0.78  CALCIUM -- 9.2  PROT 7.8 --  BILITOT 0.5 --  ALKPHOS 109 --  ALT 157* --  AST 157* --  AMYLASE -- --  LIPASE -- --  GLUCOSE -- 141*   Radiology/Studies: Dg Chest 2 View  01/05/2012  *RADIOLOGY REPORT*  Clinical Data: Chest pain.  CHEST - 2 VIEW  Comparison: One-view chest 07/16/2011.  Findings: The heart size is normal.  Slight rightward curvature of the thoracic spine is noted.  Mild interstitial coarsening is stable.  No focal airspace disease is evident.  No edema or effusion is present.  Degenerative changes are present in the thoracic spine with exaggerated kyphosis.  No focal fracture is evident.  IMPRESSION:  1.  Mild chronic interstitial changes. 2.  No acute abnormality. 3.  Exaggerated thoracic kyphosis.   Original Report Authenticated By: Marin Roberts, M.D.     EKG: NSR, 89 bpm, subtle ST changes II, III, aVF, V4-V6. Inf Qs  ASSESSMENT:   1. NSTEMI/CAD 2. EtOH abuse 3. Tobacco abuse 4. Depression 5. Hepatitis C 6. Thrombocytopenia  DISCUSSION/PLAN:  Patient presents [facility] ED today after an episode of substernal chest pain persisting for several hours. POC trop-I returned positive. He has a family history of CAD and long history of EtOH abuse and tobacco abuse. Other underlying cardiac risk factors unknown currently. Will plan for  cardiac catheterization today. Place in observation. Further risk stratify with lipid panel, A1C. Will check abdominal u/s to r/o cirrhosis, hepatosplenomegaly. Add CIWA protocol and nicotine patch. Stressed EtOH and tobacco cessation. Will check INR given HCV and additional trop-I. Pre-cath orders placed. Start low-dose ASA and low-dose statin with LFT monitoring. Further recs per interventionalist's findings.   Signed, R. Hurman Horn, PA-C 01/05/2012, 3:39 PM   Patient seen with PA, agree with the above note.  61 yo smoker with family history of CAD as well as HCV and ETOH abuse presented with chest pain and found to have NSTEMI.  1. CAD: NSTEMI.  Concerning presentation ("elephant on my chest") with pain waking him from his sleep and lasting several hours.  Now pain-free.  Troponin elevated.  ECG showed some inferior changes: slight Qs, slight ST elevation. Will give ASA in ER.  I will put him on a low dose of statin given  liver disease.  Will need to follow LFTs closely.  He will go to the cath lab today.  Check lipids in am.  2. Hepatitis: Elevated transaminases.  He has a history of ETOH.  Given elevated ETOH level today, I suspect he drinks more than he will admit to.  He also has HCV.  Will get abdominal US to assess for cirrhosis, splenomegaly.  Plts low at 100K, likely reflect ETOH + HCV marrow suppression +/- splenomegaly.  3. ETOH abuse: High ETOH level today.  Only admits to 2-3 beers/day and not every day.  Suspect more.  Needs to be on CIWA protocol.   4. Smoking: Counseled him to quit.  We will place a nicotine patch.   [name] 01/05/2012 4:42 PM

## 2012-01-05 NOTE — Progress Notes (Signed)
ANTICOAGULATION CONSULT NOTE - Initial Consult  Pharmacy Consult for heparin Indication: chest pain/ACS/NSTEMI  No Known Allergies  Patient Measurements: Height: 6' (182.9 cm) Weight: 160 lb (72.576 kg) IBW/kg (Calculated) : 77.6  Heparin Dosing Weight: 72kg  Vital Signs: BP: 109/72 mmHg (11/04 1502) Pulse Rate: 48  (11/04 1544)  Labs:  Basename 01/05/12 1508 01/05/12 1200 01/05/12 1102  HGB -- -- 16.8  HCT -- -- 45.8  PLT -- -- 100*  APTT -- -- --  LABPROT -- 13.7 --  INR -- 1.06 --  HEPARINUNFRC -- -- --  CREATININE -- -- 0.78  CKTOTAL -- -- --  CKMB -- -- --  TROPONINI 0.36* -- --    Estimated Creatinine Clearance: 98.3 ml/min (by C-G formula based on Cr of 0.78).   Medical History: Past Medical History  Diagnosis Date  . Depression   . Hepatitis C   . ETOH abuse   . Tobacco abuse    Assessment: 61 year old male presents to [facility] with chest pain and low positive CE's. Patient was taken to cath and found to have 95% proximal occlusion to left circumflux. Medical management is planned and orders received to start IV heparin with expected length of therapy 24-48h.  Goal of Therapy:  Heparin level 0.3-0.7 units/ml Monitor platelets by anticoagulation protocol: Yes   Plan:  Start heparin infusion at 900 units/hr Check anti-Xa level in 6 hours and daily while on heparin Continue to monitor H&H and platelets  Severiano Gilbert 01/05/2012,5:12 PM

## 2012-01-06 DIAGNOSIS — I214 Non-ST elevation (NSTEMI) myocardial infarction: Secondary | ICD-10-CM

## 2012-01-06 LAB — BASIC METABOLIC PANEL
BUN: 10 mg/dL (ref 6–23)
CO2: 23 mEq/L (ref 19–32)
Calcium: 8.8 mg/dL (ref 8.4–10.5)
Chloride: 105 mEq/L (ref 96–112)
Creatinine, Ser: 0.85 mg/dL (ref 0.50–1.35)
GFR calc Af Amer: >90 mL/min (ref >90)
GFR calc non Af Amer: >90 mL/min (ref >90)
Glucose, Bld: 106 mg/dL — ABNORMAL HIGH (ref 70–99)
Potassium: 3.6 mEq/L (ref 3.5–5.1)
Sodium: 136 mEq/L (ref 135–145)

## 2012-01-06 LAB — CBC
HCT: 41.9 % (ref 39.0–52.0)
Hemoglobin: 14.8 g/dL (ref 13.0–17.0)
MCH: 35.6 pg — ABNORMAL HIGH (ref 26.0–34.0)
MCHC: 35.3 g/dL (ref 30.0–36.0)
MCV: 100.7 fL — ABNORMAL HIGH (ref 78.0–100.0)
Platelets: 85 10*3/uL — ABNORMAL LOW (ref 150–400)
RBC: 4.16 MIL/uL — ABNORMAL LOW (ref 4.22–5.81)
RDW: 14 % (ref 11.5–15.5)
WBC: 4.9 10*3/uL (ref 4.0–10.5)

## 2012-01-06 LAB — LIPID PANEL
Cholesterol: 138 mg/dL (ref 0–200)
HDL: 30 mg/dL — ABNORMAL LOW (ref >39)
LDL Cholesterol: 87 mg/dL (ref 0–99)
Total CHOL/HDL Ratio: 4.6 RATIO
Triglycerides: 104 mg/dL (ref <150)
VLDL: 21 mg/dL (ref 0–40)

## 2012-01-06 LAB — TSH: TSH: 0.217 u[IU]/mL — ABNORMAL LOW (ref 0.350–4.500)

## 2012-01-06 LAB — TROPONIN I
Troponin I: 0.38 ng/mL (ref <0.30)
Troponin I: 0.39 ng/mL (ref <0.30)

## 2012-01-06 LAB — HEMOGLOBIN A1C
Hgb A1c MFr Bld: 5.6 % (ref <5.7)
Mean Plasma Glucose: 114 mg/dL (ref <117)

## 2012-01-06 MED ORDER — HEPARIN SODIUM (PORCINE) 5000 UNIT/ML IJ SOLN
5000.0000 [IU] | Freq: Three times a day (TID) | INTRAMUSCULAR | Status: DC
  Administered 2012-01-06 – 2012-01-07 (×2): 5000 [IU] via SUBCUTANEOUS
  Filled 2012-01-06 (×8): qty 1

## 2012-01-06 MED ORDER — METOPROLOL TARTRATE 12.5 MG HALF TABLET
12.5000 mg | ORAL_TABLET | Freq: Two times a day (BID) | ORAL | Status: DC
  Administered 2012-01-06 – 2012-01-07 (×3): 12.5 mg via ORAL
  Filled 2012-01-06 (×5): qty 1

## 2012-01-06 MED ORDER — LISINOPRIL 5 MG PO TABS
5.0000 mg | ORAL_TABLET | Freq: Every day | ORAL | Status: DC
  Administered 2012-01-06 – 2012-01-07 (×2): 5 mg via ORAL
  Filled 2012-01-06 (×2): qty 1

## 2012-01-06 NOTE — Progress Notes (Signed)
Patient ID: Hunter Mcmillan, male   DOB: [birth year], 61 y.o.   MRN: [id number]    SUBJECTIVE: No further chest pain.  No problems overnight.  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      . [COMPLETED] aspirin  324 mg Oral Once  . aspirin EC  81 mg Oral Daily  . atorvastatin  10 mg Oral q1800  . [COMPLETED] clopidogrel      . [COMPLETED] clopidogrel  300 mg Oral Once  . clopidogrel  75 mg Oral Q breakfast  . [COMPLETED] fentaNYL      . folic acid  1 mg Oral Daily  . [COMPLETED] heparin      . [COMPLETED] lidocaine      . lisinopril  5 mg Oral Daily  . metoprolol tartrate  12.5 mg Oral BID  . [COMPLETED] midazolam      . [COMPLETED]  morphine injection  4 mg Intravenous Once  . multivitamin with minerals  1 tablet Oral Daily  . nicotine  14 mg Transdermal Daily  . [COMPLETED] nitroGLYCERIN      . [COMPLETED] ondansetron (ZOFRAN) IV  4 mg Intravenous Once  . [COMPLETED] sodium chloride  500 mL Intravenous Once  . sodium chloride  3 mL Intravenous Q12H  . sodium chloride  3 mL Intravenous Q12H  . thiamine  100 mg Oral Daily   Or  . thiamine  100 mg Intravenous Daily  . [COMPLETED] verapamil          Filed Vitals:   01/06/12 0355 01/06/12 0400 01/06/12 0500 01/06/12 0600  BP:  121/68 120/61 126/65  Pulse:  64 62 59  Temp: 97.6 F (36.4 C)     TempSrc: Oral     Resp:  15 14 14   Height:      Weight:   166 lb 10.7 oz (75.6 kg)   SpO2:  96% 95% 96%    Intake/Output Summary (Last 24 hours) at 01/06/12 0725 Last data filed at 01/06/12 4098  Gross per 24 hour  Intake    775 ml  Output    350 ml  Net    425 ml    LABS: Basic Metabolic Panel:  Basename 01/06/12 0510 01/05/12 1102  NA 136 138  K 3.6 4.4  CL 105 102  CO2 23 25  GLUCOSE 106* 141*  BUN 10 7  CREATININE 0.85 0.78  CALCIUM 8.8 9.2  MG -- --  PHOS -- --   Liver Function Tests:  Basename 01/05/12 1240  AST 157*  ALT 157*  ALKPHOS 109  BILITOT 0.5    PROT 7.8  ALBUMIN 3.3*   No results found for this basename: LIPASE:2,AMYLASE:2 in the last 72 hours CBC:  Basename 01/06/12 0510 01/05/12 1102  WBC 4.9 5.0  NEUTROABS -- --  HGB 14.8 16.8  HCT 41.9 45.8  MCV 100.7* 99.8  PLT 85* 100*   Cardiac Enzymes:  Basename 01/06/12 0510 01/05/12 2300 01/05/12 1909  CKTOTAL -- -- --  CKMB -- -- --  CKMBINDEX -- -- --  TROPONINI 0.39* 0.38* 0.32*   BNP: No components found with this basename: POCBNP:3 D-Dimer: No results found for this basename: DDIMER:2 in the last 72 hours Hemoglobin A1C:  Basename 01/05/12 1909  HGBA1C 5.6   Fasting Lipid Panel:  Basename 01/06/12 0510  CHOL 138  HDL 30*  LDLCALC 87  TRIG 119  CHOLHDL 4.6  LDLDIRECT --   Thyroid Function Tests:  [facility] 01/05/12 1909  TSH 0.217*  T4TOTAL --  T3FREE --  THYROIDAB --   Anemia Panel: No results found for this basename: VITAMINB12,FOLATE,FERRITIN,TIBC,IRON,RETICCTPCT in the last 72 hours  RADIOLOGY: Dg Chest 2 View  01/05/2012  *RADIOLOGY REPORT*  Clinical Data: Chest pain.  CHEST - 2 VIEW  Comparison: One-view chest 07/16/2011.  Findings: The heart size is normal.  Slight rightward curvature of the thoracic spine is noted.  Mild interstitial coarsening is stable.  No focal airspace disease is evident.  No edema or effusion is present.  Degenerative changes are present in the thoracic spine with exaggerated kyphosis.  No focal fracture is evident.  IMPRESSION:  1.  Mild chronic interstitial changes. 2.  No acute abnormality. 3.  Exaggerated thoracic kyphosis.   Original Report Authenticated By: Marin Roberts, M.D.    US Abdomen Complete  01/05/2012  *RADIOLOGY REPORT*  Clinical Data:  Abdominal pain.  Current history of hepatitis C.  COMPLETE ABDOMINAL ULTRASOUND  Comparison:  CT abdomen and pelvis 07/16/2011.  Findings:  Gallbladder:  No shadowing gallstones or echogenic sludge.  No gallbladder wall thickening or pericholecystic fluid.  Negative  sonographic Murphy's sign according to the ultrasound technologist.  Common bile duct:  Normal in caliber with maximum diameter approximating 4 mm.  Liver:  Diffusely increased and coarsened echotexture without focal parenchymal abnormality.  Patent portal vein with hepatopetal flow.  IVC:  Patent.  Pancreas:  Although the pancreas is difficult to visualize in its entirety, no focal pancreatic abnormality is identified.  Spleen:  Normal size and echotexture without focal parenchymal abnormality.  Right Kidney:  No hydronephrosis.  Well-preserved cortex.  No shadowing calculi.  Normal size and parenchymal echotexture without focal abnormalities.  Approximately 11.1 cm in length.  Left Kidney:  No hydronephrosis.  Well-preserved cortex.  No shadowing calculi.  Normal size and parenchymal echotexture without focal abnormalities.  Approximately 11.2 cm in length.  Abdominal aorta:  Normal in caliber throughout its visualized course in the abdomen with mild to moderate atherosclerosis.  IMPRESSION:  1.  Diffuse hepatic steatosis and/or hepatocellular disease.  No focal hepatic parenchymal abnormality. 2.  Abdominal aortic atherosclerosis without evidence of aneurysm. 3.  Otherwise normal examination.   Original Report Authenticated By: Hulan Saas, M.D.     PHYSICAL EXAM General: NAD Neck: No JVD, no thyromegaly or thyroid nodule.  Lungs: Clear to auscultation bilaterally with normal respiratory effort. CV: Nondisplaced PMI.  Heart regular S1/S2, no S3/S4, no murmur.  No peripheral edema.  No carotid bruit.  Normal pedal pulses.  Abdomen: Soft, nontender, no hepatosplenomegaly, no distention.  Neurologic: Alert and oriented x 3.  Psych: Normal affect. Extremities: No clubbing or cyanosis.   TELEMETRY: Reviewed telemetry pt in NSR  ASSESSMENT AND PLAN:  61 yo with history of HCV, smoking, and ETOH abuse presented with NSTEMI.  1. 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  Too small and diffusely  diseased for effective intervention, so plan medical management.  - Add low dose metoprolol and lisinopril. - Continue ASA 81 and Plavix. - Continue low dose statin (started at low dose due to baseline LFT abnormalities).  - Stop heparin today with platelets down to 85K.  - Cardiac rehab. 2. Smoking: Counseled to quit.  3. ETOH: Counseled to quit. Watch on CIWA protocol.  4. Hepatitis: Combination of HCV and ETOH.  Diffuse hepatic steatosis on abdominal US.  5. If stable and doing well, home tomorrow.  Will send to floor.   [name] 01/06/2012 7:29 AM

## 2012-01-06 NOTE — ED Provider Notes (Signed)
Medical screening examination/treatment/procedure(s) were performed by non-physician practitioner and as supervising physician I was immediately available for consultation/collaboration.  Tobin Chad, MD 01/06/12 [phone]

## 2012-01-06 NOTE — Progress Notes (Signed)
*  PRELIMINARY RESULTS* Echocardiogram 2D Echocardiogram has been performed.  Hunter Mcmillan 01/06/2012, 9:47 AM

## 2012-01-06 NOTE — Progress Notes (Signed)
Patient transferred to 4738 from ICU. Pt alert and oriented. Explained call bell system to patient. Right radial site - level zero. VSS Will continue to monitor as needed to end of shift.

## 2012-01-06 NOTE — Progress Notes (Signed)
CARDIAC REHAB PHASE I   PRE:  Rate/Rhythm: 58 SB    BP: sitting 132/68    SaO2:   MODE:  Ambulation: 400 ft   POST:  Rate/Rhythm: 71 SR    BP: sitting 143/77     SaO2:   Tolerated well with no CP. Slight imbalance, he sts due to being in bed. Began ed including smoking cessation. Sts he wants to try to quit. Gave resources. Will f/u in am for ex gl. [phone]  Harriet Masson CES, ACSM

## 2012-01-07 ENCOUNTER — Encounter (HOSPITAL_COMMUNITY): Payer: Self-pay | Admitting: Physician Assistant

## 2012-01-07 LAB — BASIC METABOLIC PANEL
BUN: 9 mg/dL (ref 6–23)
CO2: 24 mEq/L (ref 19–32)
Calcium: 9.1 mg/dL (ref 8.4–10.5)
Chloride: 101 mEq/L (ref 96–112)
Creatinine, Ser: 0.81 mg/dL (ref 0.50–1.35)
GFR calc Af Amer: >90 mL/min (ref >90)
GFR calc non Af Amer: >90 mL/min (ref >90)
Glucose, Bld: 80 mg/dL (ref 70–99)
Potassium: 4.4 mEq/L (ref 3.5–5.1)
Sodium: 134 mEq/L — ABNORMAL LOW (ref 135–145)

## 2012-01-07 LAB — CBC
HCT: 42.8 % (ref 39.0–52.0)
Hemoglobin: 15.2 g/dL (ref 13.0–17.0)
MCH: 35.3 pg — ABNORMAL HIGH (ref 26.0–34.0)
MCHC: 35.5 g/dL (ref 30.0–36.0)
MCV: 99.5 fL (ref 78.0–100.0)
Platelets: 61 10*3/uL — ABNORMAL LOW (ref 150–400)
RBC: 4.3 MIL/uL (ref 4.22–5.81)
RDW: 13.9 % (ref 11.5–15.5)
WBC: 4.2 10*3/uL (ref 4.0–10.5)

## 2012-01-07 MED ORDER — ATORVASTATIN CALCIUM 10 MG PO TABS: 10.0000 mg | ORAL_TABLET | Freq: Every day | ORAL | Status: DC

## 2012-01-07 MED ORDER — CLOPIDOGREL BISULFATE 75 MG PO TABS: 75.0000 mg | ORAL_TABLET | Freq: Every day | ORAL | Status: DC

## 2012-01-07 MED ORDER — NICOTINE 14 MG/24HR TD PT24: 1.0000 | MEDICATED_PATCH | Freq: Every day | TRANSDERMAL | Status: DC

## 2012-01-07 MED ORDER — NICOTINE 7 MG/24HR TD PT24: 1.0000 | MEDICATED_PATCH | TRANSDERMAL | Status: DC

## 2012-01-07 MED ORDER — ASPIRIN 81 MG PO TBEC: 81.0000 mg | DELAYED_RELEASE_TABLET | Freq: Every day | ORAL | Status: DC

## 2012-01-07 MED ORDER — NITROGLYCERIN 0.4 MG SL SUBL: 0.4000 mg | SUBLINGUAL_TABLET | SUBLINGUAL | Status: DC | PRN

## 2012-01-07 MED ORDER — METOPROLOL TARTRATE 12.5 MG HALF TABLET: 12.5000 mg | ORAL_TABLET | Freq: Two times a day (BID) | ORAL | Status: DC

## 2012-01-07 MED ORDER — LISINOPRIL 5 MG PO TABS: 5.0000 mg | ORAL_TABLET | Freq: Every day | ORAL | Status: DC

## 2012-01-07 NOTE — Progress Notes (Signed)
Ed completed. Interested in [facility] and requests his name be sent to [place]. Gave financial aid application. [phone] Ethelda Chick CES, ACSM

## 2012-01-07 NOTE — Discharge Summary (Signed)
Discharge Summary   Patient ID: Hunter Mcmillan,  MRN: [id number], DOB/AGE: 62-May-1952 62 y.o.  Admit date: 01/05/2012 Discharge date: 01/07/2012  Primary Physician: Sheila Oats, MD Primary Cardiologist: new- seen initially by [name], MD  Discharge Diagnoses Principal Problem:  *NSTEMI (non-ST elevated myocardial infarction) Active Problems:  Alcohol abuse  Tobacco abuse  Hepatitis C  Depression  Thrombocytopenia  Allergies No Known Allergies  Diagnostic Studies/Procedures  PA/LATERAL CHEST X-RAY - 01/05/12  Comparison: One-view chest 07/16/2011.  Findings: The heart size is normal. Slight rightward curvature of  the thoracic spine is noted. Mild interstitial coarsening is  stable. No focal airspace disease is evident. No edema or  effusion is present. Degenerative changes are present in the  thoracic spine with exaggerated kyphosis. No focal fracture is  evident.  IMPRESSION:  1. Mild chronic interstitial changes.  2. No acute abnormality.  3. Exaggerated thoracic kyphosis.  CARDIAC CATHETERIZATION - 01/05/12  Hemodynamics:  AO 107/60  LV 120/15  Coronary angiography:  Coronary dominance: right  Left mainstem: LM is same as LAD given anomalous LCx off the RCA.  Left anterior descending (LAD): 20% ostial stenosis, 30% proximal stenosis, 30% mid LAD stenosis.  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 Small caliber vessel with hazy 95% proximal stenosis. TIMI 3 flow down vessel.  Right coronary artery (RCA): 30% proximal, 30% mid RCA stenosis.  Left ventriculography: Left ventricular systolic function is normal, LVEF is estimated at 60-65%, there is mild mitral regurgitation   ABDOMINAL ULTRASOUND - 01/05/12  COMPLETE ABDOMINAL ULTRASOUND  Comparison: CT abdomen and pelvis 07/16/2011.  Findings:  Gallbladder: No shadowing gallstones or echogenic sludge. No  gallbladder wall thickening or pericholecystic fluid. Negative    sonographic Murphy's sign according to the ultrasound technologist.  Common bile duct: Normal in caliber with maximum diameter  approximating 4 mm.  Liver: Diffusely increased and coarsened echotexture without focal  parenchymal abnormality. Patent portal vein with hepatopetal flow.  IVC: Patent.  Pancreas: Although the pancreas is difficult to visualize in its  entirety, no focal pancreatic abnormality is identified.  Spleen: Normal size and echotexture without focal parenchymal  abnormality.  Right Kidney: No hydronephrosis. Well-preserved cortex. No  shadowing calculi. Normal size and parenchymal echotexture without  focal abnormalities. Approximately 11.1 cm in length.  Left Kidney: No hydronephrosis. Well-preserved cortex. No  shadowing calculi. Normal size and parenchymal echotexture without  focal abnormalities. Approximately 11.2 cm in length.  Abdominal aorta: Normal in caliber throughout its visualized  course in the abdomen with mild to moderate atherosclerosis.  IMPRESSION:  1. Diffuse hepatic steatosis and/or hepatocellular disease. No  focal hepatic parenchymal abnormality.  2. Abdominal aortic atherosclerosis without evidence of aneurysm.  3. Otherwise normal examination.  2D ECHOCARDIOGRAM - 01/06/12  Study Conclusions  - Left ventricle: The cavity size was normal. There was mild focal basal hypertrophy of the septum. Systolic function was vigorous. The estimated ejection fraction was in the range of 65% to 70%. Wall motion was normal; there were no regional wall motion abnormalities. Features are consistent with a pseudonormal left ventricular filling pattern, with concomitant abnormal relaxation and increased filling pressure (grade 2 diastolic dysfunction). - Aortic valve: Mildly calcified annulus. Probably trileaflet; mildly calcified leaflets. There was no stenosis. Trivial regurgitation. - Mitral valve: Calcified annulus. Trivial regurgitation. -  Tricuspid valve: Trivial regurgitation. - Pulmonary arteries: PA peak pressure: 36mm Hg (S). - Pericardium, extracardiac: There was no pericardial effusion.  History of Present Illness  Mr. Hunter Mcmillan is a 61yo  male who was admitted to [facility] on 01/05/12 with the above problem list.   He reported experiencing constant chest discomfort ~ 3 AM the morning of admission described as "an elephant sitting on my chest" which persisted throughout the morning. He took NTG SL x 2 w/o relief. He reports prior chest pressure last year, and underwent a stress test which was normal. No recurrent incidents. Reports mother had an MI at 40. + tobacco abuse: 1/2 PPD. EtOH abuse- used to drink 6-8 beers daily, now 2-3 on weekends. Denied EtOH on interview. He subsequently presented to the ED.   There, EKG revealed subtle inferolateral changes. Initial trop-I mildly elevated. AST/ALT are mildly elevated. The pain persisted initially, but eventually dissipated with multiple NTG and MSO4. U/A unremarkable. UDS + for opiates (received morphine). Ethyl alcohol 90. CBC revealed thrombocytopenia (PLT 100). CMET revealed elevated AST/ALT (180s). CXR reveals mild chronic interstitial changes, no acute abnormality and thoracic kyphosis. Given the quality of his chest pain, mild troponin elevation and likely significant cardiac RFs, the decision was made to pursue cardiac catheterization.   Hospital Course   He was heparinized, informed, consented and prepped for the procedure which is outlined in full above. This was notable for 20% soital, 30% prox and 30% mid LAD stenoses; anomalous small-caliber, hazy LCx w/ 95% prox stenosis (TIMI 3 flow), 30% prox and 30% mid RCA stenoses; LVEF 60-65%. The culprit lesion was determined to be the anomalous LCx, however this was not amenable to PCI. The recommendation was made to medically manage and start Plavix. He tolerated the procedure well w/o complications. He placed on CIWA protocol. He  remained stable the following day without acute events. Serial troponins returned milldy elevated and plateaued. TSH did returned mildly decreased. A1C WNL. Lipid panel revealed LDL 87, HDL 30, TC 138, TG 104. ACEi and BB were added. Low-dose statin was started given underlying chronic hepatitis C. Heparin had been continued for thrombocytopenia post-cath. 2D echo as above revealed LVEF 65-70%, grade 2 dd, PASP 36 mmHg. Abdominal u/s as above revealed diffuse hepatic steatosis +/- hepatocellular disease, no focal hepatic abnlity, abdominal atherosclerosis w/o aneurysm.   He ambulated well with cardiac rehab and remained stable overnight. PLTs returned lower post-cath and continued to decrease (85 initially-> 61 this AM). There was no evidence of EtOH withdrawal this admission. Dr. Shirlee Latch evaluated the patient this morning, and found him stable for discharge with close follow-up. He will be seen </= 7 days given NSTEMI status. He will have labwork done early next week with review and recommendations to be made on follow-up. He will follow-up with cardiac rehab as an outpatient. He will continue the medications below. TD nicotine replacement has been employed for tobacco cessation assistance. This information, including post-cath instructions, has been clearly outlined in the discharge AVS.   Discharge Vitals:  Blood pressure 118/60, pulse 62, temperature 98 F (36.7 C), temperature source Oral, resp. rate 18, height 6' (1.829 m), weight 74.435 kg (164 lb 1.6 oz), SpO2 96.00%.   Labs: Recent Labs  Basename 01/07/12 0720 01/06/12 0510   WBC 4.2 4.9   HGB 15.2 14.8   HCT 42.8 41.9   MCV 99.5 100.7*   PLT 61* 85*   Lab 01/07/12 0720 01/06/12 0510 01/05/12 1240 01/05/12 1102  NA 134* 136 -- 138  K 4.4 3.6 -- 4.4  CL 101 105 -- 102  CO2 24 23 -- 25  BUN 9 10 -- 7  CREATININE 0.81 0.85 -- 0.78  CALCIUM 9.1 8.8 -- 9.2  PROT -- -- 7.8 --  BILITOT -- -- 0.5 --  ALKPHOS -- -- 109 --  ALT -- -- 157*  --  AST -- -- 157* --  AMYLASE -- -- -- --  LIPASE -- -- -- --  GLUCOSE 80 106* -- 141*   Recent Labs  Basename 01/05/12 1909   HGBA1C 5.6   Recent Labs  Basename 01/06/12 0510 01/05/12 2300 01/05/12 1909   CKTOTAL -- -- --   CKMB -- -- --   CKMBINDEX -- -- --   TROPONINI 0.39* 0.38* 0.32*   Recent Labs  Basename 01/06/12 0510   CHOL 138   HDL 30*   LDLCALC 87   TRIG 104   CHOLHDL 4.6   LDLDIRECT --    Basename 01/05/12 1909  TSH 0.217*  T4TOTAL --  T3FREE --  THYROIDAB --    Disposition:  Discharge Orders    Future Appointments: Provider: Department: Dept Phone: Center:   01/12/2012 8:10 AM [facility] Main Office [place]) [phone] [facility]   01/14/2012 11:50 AM Lorin Picket Moishe Spice, PA [facility] Main Office [place]) [phone] [facility]     Future Orders Please Complete By Expires   Amb Referral to Cardiac Rehabilitation        Follow-up Information    Follow up with [facility]. On 01/12/2012. (For labwork. Please arrive between 8:30 AM and 5:00 PM. )    Contact information:   [address] [place] [address]       Follow up with Tereso Newcomer, PA. On 01/14/2012. (At 11:50 AM. You will see Tereso Newcomer, PA-C on a day Dr. Shirlee Latch is in the office. )    Contact information:   [address] [place] [address] [phone]         Discharge Medications:    Medication List     As of 01/07/2012 12:40 PM    START taking these medications         aspirin 81 MG EC tablet   Take 1 tablet (81 mg total) by mouth daily.      atorvastatin 10 MG tablet   Commonly known as: LIPITOR   Take 1 tablet (10 mg total) by mouth daily at 6 PM.      clopidogrel 75 MG tablet   Commonly known as: PLAVIX   Take 1 tablet (75 mg total) by mouth daily with breakfast.      lisinopril 5 MG tablet   Commonly known as: PRINIVIL,ZESTRIL   Take 1 tablet (5 mg total) by mouth daily.       metoprolol tartrate 12.5 mg Tabs   Commonly known as: LOPRESSOR   Take 0.5 tablets (12.5 mg total) by mouth 2 (two) times daily.      * nicotine 14 mg/24hr patch   Commonly known as: NICODERM CQ - dosed in mg/24 hours   Place 1 patch onto the skin daily.      * nicotine 7 mg/24hr patch   Commonly known as: NICODERM CQ - dosed in mg/24 hr   Place 1 patch onto the skin daily.   Start taking on: 01/29/2012      nitroGLYCERIN 0.4 MG SL tablet   Commonly known as: NITROSTAT   Place 1 tablet (0.4 mg total) under the tongue every 5 (five) minutes x 3 doses as needed for chest pain.     * Notice: This list has 2 medication(s) that are the  same as other medications prescribed for you. Read the directions carefully, and ask your doctor or other care provider to review them with you.    CONTINUE taking these medications         citalopram 20 MG tablet   Commonly known as: CELEXA      multivitamin with minerals Tabs          Where to get your medications    These are the prescriptions that you need to pick up. We sent them to a specific pharmacy, so you will need to go there to get them.   [id number] - [place], [place] - [address]    [address] [place] [address]    Phone: [phone]        atorvastatin 10 MG tablet   clopidogrel 75 MG tablet   lisinopril 5 MG tablet   metoprolol tartrate 12.5 mg Tabs   nicotine 14 mg/24hr patch   nicotine 7 mg/24hr patch   nitroGLYCERIN 0.4 MG SL tablet         Information on where to get these meds is not yet available. Ask your nurse or doctor.         aspirin 81 MG EC tablet           Outstanding Labs/Studies: CMET & CBC in 01/12/12  Duration of Discharge Encounter: Greater than 30 minutes including physician time.  Signed, R. Hurman Horn, PA-C 01/07/2012, 12:40 PM

## 2012-01-07 NOTE — Progress Notes (Signed)
Patient ID: Hunter Mcmillan, male   DOB: [birth year], 61 y.o.   MRN: [id number]     SUBJECTIVE: No further chest pain; walked with cardiac rehab.  No problems overnight.  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      Marland Kitchen aspirin EC  81 mg Oral Daily  . atorvastatin  10 mg Oral q1800  . clopidogrel  75 mg Oral Q breakfast  . folic acid  1 mg Oral Daily  . heparin subcutaneous  5,000 Units Subcutaneous Q8H  . lisinopril  5 mg Oral Daily  . metoprolol tartrate  12.5 mg Oral BID  . multivitamin with minerals  1 tablet Oral Daily  . nicotine  14 mg Transdermal Daily  . sodium chloride  3 mL Intravenous Q12H  . thiamine  100 mg Oral Daily   Or  . thiamine  100 mg Intravenous Daily  . [DISCONTINUED] sodium chloride  3 mL Intravenous Q12H      Filed Vitals:   01/06/12 1200 01/06/12 1839 01/06/12 2028 01/07/12 0442  BP: 136/76 128/71 137/78 113/66  Pulse: 55 72 76 65  Temp: 98.5 F (36.9 C) 98.5 F (36.9 C) 98.4 F (36.9 C) 98 F (36.7 C)  TempSrc:   Oral Oral  Resp: 18 16 17 18   Height:      Weight:    164 lb 1.6 oz (74.435 kg)  SpO2:   95% 96%    Intake/Output Summary (Last 24 hours) at 01/07/12 0754 Last data filed at 01/06/12 2238  Gross per 24 hour  Intake    123 ml  Output    300 ml  Net   -177 ml    LABS: Basic Metabolic Panel:  Basename 01/06/12 0510 01/05/12 1102  NA 136 138  K 3.6 4.4  CL 105 102  CO2 23 25  GLUCOSE 106* 141*  BUN 10 7  CREATININE 0.85 0.78  CALCIUM 8.8 9.2  MG -- --  PHOS -- --   Liver Function Tests:  Basename 01/05/12 1240  AST 157*  ALT 157*  ALKPHOS 109  BILITOT 0.5  PROT 7.8  ALBUMIN 3.3*   No results found for this basename: LIPASE:2,AMYLASE:2 in the last 72 hours CBC:  Basename 01/06/12 0510 01/05/12 1102  WBC 4.9 5.0  NEUTROABS -- --  HGB 14.8 16.8  HCT 41.9 45.8  MCV 100.7* 99.8  PLT 85* 100*   Cardiac Enzymes:  Basename 01/06/12 0510 01/05/12 2300 01/05/12  1909  CKTOTAL -- -- --  CKMB -- -- --  CKMBINDEX -- -- --  TROPONINI 0.39* 0.38* 0.32*   BNP: No components found with this basename: POCBNP:3 D-Dimer: No results found for this basename: DDIMER:2 in the last 72 hours Hemoglobin A1C:  Basename 01/05/12 1909  HGBA1C 5.6   Fasting Lipid Panel:  Basename 01/06/12 0510  CHOL 138  HDL 30*  LDLCALC 87  TRIG 562  CHOLHDL 4.6  LDLDIRECT --   Thyroid Function Tests:  Basename 01/05/12 1909  TSH 0.217*  T4TOTAL --  T3FREE --  THYROIDAB --   Anemia Panel: No results found for this basename: VITAMINB12,FOLATE,FERRITIN,TIBC,IRON,RETICCTPCT in the last 72 hours  RADIOLOGY: Dg Chest 2 View  01/05/2012  *RADIOLOGY REPORT*  Clinical Data: Chest pain.  CHEST - 2 VIEW  Comparison: One-view chest 07/16/2011.  Findings: The heart size is normal.  Slight rightward curvature of the thoracic spine is noted.  Mild interstitial coarsening is stable.  No focal airspace disease  is evident.  No edema or effusion is present.  Degenerative changes are present in the thoracic spine with exaggerated kyphosis.  No focal fracture is evident.  IMPRESSION:  1.  Mild chronic interstitial changes. 2.  No acute abnormality. 3.  Exaggerated thoracic kyphosis.   Original Report Authenticated By: Marin Roberts, M.D.    US Abdomen Complete  01/05/2012  *RADIOLOGY REPORT*  Clinical Data:  Abdominal pain.  Current history of hepatitis C.  COMPLETE ABDOMINAL ULTRASOUND  Comparison:  CT abdomen and pelvis 07/16/2011.  Findings:  Gallbladder:  No shadowing gallstones or echogenic sludge.  No gallbladder wall thickening or pericholecystic fluid.  Negative sonographic Murphy's sign according to the ultrasound technologist.  Common bile duct:  Normal in caliber with maximum diameter approximating 4 mm.  Liver:  Diffusely increased and coarsened echotexture without focal parenchymal abnormality.  Patent portal vein with hepatopetal flow.  IVC:  Patent.  Pancreas:   Although the pancreas is difficult to visualize in its entirety, no focal pancreatic abnormality is identified.  Spleen:  Normal size and echotexture without focal parenchymal abnormality.  Right Kidney:  No hydronephrosis.  Well-preserved cortex.  No shadowing calculi.  Normal size and parenchymal echotexture without focal abnormalities.  Approximately 11.1 cm in length.  Left Kidney:  No hydronephrosis.  Well-preserved cortex.  No shadowing calculi.  Normal size and parenchymal echotexture without focal abnormalities.  Approximately 11.2 cm in length.  Abdominal aorta:  Normal in caliber throughout its visualized course in the abdomen with mild to moderate atherosclerosis.  IMPRESSION:  1.  Diffuse hepatic steatosis and/or hepatocellular disease.  No focal hepatic parenchymal abnormality. 2.  Abdominal aortic atherosclerosis without evidence of aneurysm. 3.  Otherwise normal examination.   Original Report Authenticated By: Hulan Saas, M.D.     PHYSICAL EXAM General: NAD Neck: No JVD, no thyromegaly or thyroid nodule.  Lungs: Clear to auscultation bilaterally with normal respiratory effort. CV: Nondisplaced PMI.  Heart regular S1/S2, no S3/S4, no murmur.  No peripheral edema.  No carotid bruit.  Normal pedal pulses.  Abdomen: Soft, nontender, no hepatosplenomegaly, no distention.  Neurologic: Alert and oriented x 3.  Psych: Normal affect. Extremities: No clubbing or cyanosis.   TELEMETRY: Reviewed telemetry pt in NSR  ASSESSMENT AND PLAN:  61 yo with history of HCV, smoking, and ETOH abuse presented with NSTEMI.  1. 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  Too small and diffusely diseased for effective intervention, so plan medical management.  No further chest pain.  - ASA 81, Plavix, metoprolol, lisinopril. - Continue low dose statin (started at low dose due to baseline LFT abnormalities).  2. Smoking: Counseled to quit.  3. ETOH: Counseled to quit. No signs of withdrawal while in  hospital.  4. Hepatitis: Combination of HCV and ETOH.  Diffuse hepatic steatosis on abdominal US.  5. Disposition: Ok for home today.  Will need followup with me in 2 wks.  He will need CMET (for LFTs) and CBC prior to appointment.  Meds for discharge: ASA 81, clopidogrel 75 daily, atorvastatin 10 mg daily, lisinopril 5 mg daily, metoprolol 12.5 mg bid, nicotine patch.    [name] 01/07/2012 7:54 AM

## 2012-01-08 ENCOUNTER — Other Ambulatory Visit: Payer: Self-pay | Admitting: *Deleted

## 2012-01-08 DIAGNOSIS — F1010 Alcohol abuse, uncomplicated: Secondary | ICD-10-CM

## 2012-01-08 DIAGNOSIS — I214 Non-ST elevation (NSTEMI) myocardial infarction: Secondary | ICD-10-CM

## 2012-01-12 ENCOUNTER — Other Ambulatory Visit (INDEPENDENT_AMBULATORY_CARE_PROVIDER_SITE_OTHER): Payer: Self-pay

## 2012-01-12 DIAGNOSIS — F1010 Alcohol abuse, uncomplicated: Secondary | ICD-10-CM

## 2012-01-12 DIAGNOSIS — I214 Non-ST elevation (NSTEMI) myocardial infarction: Secondary | ICD-10-CM

## 2012-01-12 LAB — CBC WITH DIFFERENTIAL/PLATELET
Basophils Absolute: 0 10*3/uL (ref 0.0–0.1)
Basophils Relative: 0.7 % (ref 0.0–3.0)
Eosinophils Absolute: 0.1 10*3/uL (ref 0.0–0.7)
Eosinophils Relative: 2.4 % (ref 0.0–5.0)
HCT: 47.6 % (ref 39.0–52.0)
Hemoglobin: 16 g/dL (ref 13.0–17.0)
Lymphocytes Relative: 36.7 % (ref 12.0–46.0)
Lymphs Abs: 1.8 10*3/uL (ref 0.7–4.0)
MCHC: 33.5 g/dL (ref 30.0–36.0)
MCV: 106.5 fl — ABNORMAL HIGH (ref 78.0–100.0)
Monocytes Absolute: 0.6 10*3/uL (ref 0.1–1.0)
Monocytes Relative: 12.2 % — ABNORMAL HIGH (ref 3.0–12.0)
Neutro Abs: 2.4 10*3/uL (ref 1.4–7.7)
Neutrophils Relative %: 48 % (ref 43.0–77.0)
Platelets: 101 10*3/uL — ABNORMAL LOW (ref 150.0–400.0)
RBC: 4.47 Mil/uL (ref 4.22–5.81)
RDW: 15.4 % — ABNORMAL HIGH (ref 11.5–14.6)
WBC: 4.9 10*3/uL (ref 4.5–10.5)

## 2012-01-12 LAB — COMPREHENSIVE METABOLIC PANEL
ALT: 258 U/L — ABNORMAL HIGH (ref 0–53)
AST: 131 U/L — ABNORMAL HIGH (ref 0–37)
Albumin: 3.5 g/dL (ref 3.5–5.2)
Alkaline Phosphatase: 113 U/L (ref 39–117)
BUN: 12 mg/dL (ref 6–23)
CO2: 29 mEq/L (ref 19–32)
Calcium: 9.3 mg/dL (ref 8.4–10.5)
Chloride: 100 mEq/L (ref 96–112)
Creatinine, Ser: 0.9 mg/dL (ref 0.4–1.5)
GFR: 86.24 mL/min (ref >60.00)
Glucose, Bld: 86 mg/dL (ref 70–99)
Potassium: 3.9 mEq/L (ref 3.5–5.1)
Sodium: 135 mEq/L (ref 135–145)
Total Bilirubin: 1.4 mg/dL — ABNORMAL HIGH (ref 0.3–1.2)
Total Protein: 7.7 g/dL (ref 6.0–8.3)

## 2012-01-13 ENCOUNTER — Telehealth: Payer: Self-pay | Admitting: *Deleted

## 2012-01-13 DIAGNOSIS — B1920 Unspecified viral hepatitis C without hepatic coma: Secondary | ICD-10-CM

## 2012-01-13 NOTE — Telephone Encounter (Signed)
Message copied by Tarri Fuller on Tue Jan 13, 2012  2:27 PM ------      Message from: [place], [place] T      Created: Tue Jan 13, 2012  8:38 AM       Platelets stable.      High MCV likely related to chronic liver disease.      LFTs have increased since d/c from hospital.      STOP Lipitor.      Plan repeat LFTs tomorrow when he sees me in follow up.      Ask who he sees for Hepatitis C and cc labs to him/her.      Tereso Newcomer, PA-C  8:38 AM 01/13/2012

## 2012-01-13 NOTE — Telephone Encounter (Signed)
mptcb to go over lab results. pt does have f/u tomorrow with Bing Neighbors. PAC, if I do not s/w pt today we will d/w pt at vist for lab results

## 2012-01-14 ENCOUNTER — Encounter: Payer: Self-pay | Admitting: Physician Assistant

## 2012-01-14 ENCOUNTER — Ambulatory Visit (INDEPENDENT_AMBULATORY_CARE_PROVIDER_SITE_OTHER): Payer: Self-pay | Admitting: Physician Assistant

## 2012-01-14 VITALS — BP 106/62 | HR 49 | Ht 72.0 in | Wt 165.0 lb

## 2012-01-14 DIAGNOSIS — R001 Bradycardia, unspecified: Secondary | ICD-10-CM

## 2012-01-14 DIAGNOSIS — B1920 Unspecified viral hepatitis C without hepatic coma: Secondary | ICD-10-CM

## 2012-01-14 DIAGNOSIS — I25110 Atherosclerotic heart disease of native coronary artery with unstable angina pectoris: Secondary | ICD-10-CM | POA: Insufficient documentation

## 2012-01-14 DIAGNOSIS — F17200 Nicotine dependence, unspecified, uncomplicated: Secondary | ICD-10-CM

## 2012-01-14 DIAGNOSIS — Z720 Tobacco use: Secondary | ICD-10-CM

## 2012-01-14 DIAGNOSIS — I498 Other specified cardiac arrhythmias: Secondary | ICD-10-CM

## 2012-01-14 DIAGNOSIS — D696 Thrombocytopenia, unspecified: Secondary | ICD-10-CM

## 2012-01-14 DIAGNOSIS — I2510 Atherosclerotic heart disease of native coronary artery without angina pectoris: Secondary | ICD-10-CM

## 2012-01-14 LAB — HEPATIC FUNCTION PANEL
ALT: 171 U/L — ABNORMAL HIGH (ref 0–53)
AST: 95 U/L — ABNORMAL HIGH (ref 0–37)
Albumin: 3.4 g/dL — ABNORMAL LOW (ref 3.5–5.2)
Alkaline Phosphatase: 114 U/L (ref 39–117)
Bilirubin, Direct: 0.4 mg/dL — ABNORMAL HIGH (ref 0.0–0.3)
Total Bilirubin: 1 mg/dL (ref 0.3–1.2)
Total Protein: 7.6 g/dL (ref 6.0–8.3)

## 2012-01-14 NOTE — Patient Instructions (Addendum)
Your physician recommends that you return for lab work in:  LFT TODAY  STOP LIPITOR  FOLLOW UP WITH DR. Shirlee Latch ON 02/16/12 @ 11AM

## 2012-01-14 NOTE — Progress Notes (Signed)
[address], [address] [employer] [place], [place]  [address] Phone: [phone], Fax:  [phone]  Date:  01/14/2012   Name:  Hunter Mcmillan   DOB:  30-May-1950   MRN:  [id number]  PCP:  Sheila Oats, MD  Primary Cardiologist:  Dr. [name]  Primary Electrophysiologist:  None    History of Present Illness: Hunter Mcmillan is a 61 y.o. male who returns for follow up after recent admission to the hospital for NSTEMI.  He has a history of depression, alcohol and tobacco abuse and hepatitis C. He was admitted 11/4-11/6 with a non-STEMI. He presented with significant chest pressure. 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 The patient was pain-free at the time of his catheterization. The CFX was quite small in caliber and it was ultimately decided to plan for medical therapy. Echo demonstrated EF 65-70% with Gr 2 diast dysfn. Patient did have abdominal ultrasound that demonstrated diffuse hepatic steatosis and/or hepatocellular disease. He had elevated LFTs. He was placed on a low-dose statin only due to his history of hepatitis C. Patient had followup labs earlier this week. His LFTs were worse and he was to d/c his statin.  Patient never got the message and is still taking this medication.  He denies chest pain, dyspnea, syncope, orthopnea, PND, edema.  He had nausea for one day after d/c.  No further nausea.  No vomiting, abdominal pain, change in stools, melena, hematochezia.  No feelings of confusion.  He is not drinking ETOH.  He is down to 1-2 cigs per day.  He is trying to find a PCP.    Labs (11/13):    Hgb 16 (MCV 106.5), platelets 101K, K 3.9, creatinine 0.9, AST 131, ALT 157=>258, Tbili 1.4LDL 87, TSH 0.217  Wt Readings from Last 3 Encounters:  01/14/12 165 lb (74.844 kg)  01/07/12 164 lb 1.6 oz (74.435 kg)  01/07/12 164 lb 1.6 oz (74.435 kg)     Past Medical History  Diagnosis Date  .  Depression   . Hepatitis C     a. s/p [name] ~ 2003 in [place], [place] => stopped after 6 mos due to neutropenia - viral load noted to be low at that time;  no follow up since;   b. abdominal u/s 11/13:  diffuse hepatic steatosis and/or hepatocellular disease    . ETOH abuse   . Tobacco abuse   . CAD (coronary artery disease) 11/13    a. 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 Hx of echocardiogram     a. Echo 01/06/12: Mild focal basal hypertrophy the septum, vigorous LV, EF 65-70%, Gr 2 diast dysfn, trivial AI, MAC, trivial MR, trivial TR, PASP 36  . Thrombocytopenia     Current Outpatient Prescriptions  Medication Sig Dispense Refill  . aspirin EC 81 MG EC tablet Take 1 tablet (81 mg total) by mouth daily.      Marland Kitchen atorvastatin (LIPITOR) 10 MG tablet Take 1 tablet (10 mg total) by mouth daily at 6 PM.  30 tablet  3  . citalopram (CELEXA) 20 MG tablet Take 20 mg by mouth daily at 12 noon.      . clopidogrel (PLAVIX) 75 MG tablet Take 1 tablet (75 mg  total) by mouth daily with breakfast.  30 tablet  3  . lisinopril (PRINIVIL,ZESTRIL) 5 MG tablet Take 1 tablet (5 mg total) by mouth daily.  30 tablet  3  . metoprolol tartrate (LOPRESSOR) 12.5 mg TABS Take 0.5 tablets (12.5 mg total) by mouth 2 (two) times daily.  30 tablet  3  . Multiple Vitamin (MULTIVITAMIN WITH MINERALS) TABS Take 1 tablet by mouth daily.      . nicotine (NICODERM CQ - DOSED IN MG/24 HOURS) 14 mg/24hr patch Place 1 patch onto the skin daily.  28 patch  0  . nitroGLYCERIN (NITROSTAT) 0.4 MG SL tablet Place 1 tablet (0.4 mg total) under the tongue every 5 (five) minutes x 3 doses as needed for chest pain.  25 tablet  3    Allergies:   No Known Allergies  Social History:  The patient  reports that he has been smoking  Cigarettes.  He has been smoking about 1-2 cigs per day. He does not have any smokeless tobacco history on file. He reports that he quit drinking alcohol since d/c from the hospital. He has a prior significant hx of ETOH abuse.  He reports that he does not use illicit drugs.   ROS:  Please see the history of present illness.   All other systems reviewed and negative.   PHYSICAL EXAM: VS:  BP 106/62  Pulse 49  Ht 6' (1.829 m)  Wt 165 lb (74.844 kg)  BMI 22.38 kg/m2 Well nourished, well developed, in no acute distress HEENT: normal Neck: no JVD Cardiac:  normal S1, S2; RRR; no murmur Lungs:  clear to auscultation bilaterally, no wheezing, rhonchi or rales Abd: soft, nontender, no hepatomegaly Ext: no edema; right wrist without hematoma or bruit  Skin: warm and dry Neuro:  CNs 2-12 intact, no focal abnormalities noted  EKG:  Sinus bradycardia, HR 49, normal axis, no acute changes      ASSESSMENT AND PLAN:  1. Coronary Artery Disease:   He is doing well after recent non-STEMI. As noted, he had a small circumflex with high-grade lesion that was treated medically. He is not describing any symptoms of angina. His recent hemoglobin and platelet count were stable. Continue aspirin and Plavix. Statin therapy will be discontinued in the setting of transaminitis and chronic liver disease. Plan followup with Dr. Shirlee Latch in 4 weeks.  2. Hepatitis C:   The patient had diffuse hepatic steatosis on abdominal ultrasound. LFTs increased significantly with the addition of Lipitor. Of note, the patient tells me that he took approximately 5 g of acetaminophen on the day he presented to the hospital. He probably took this in a span of about 6-8 hours. He is greater than 7 days out from this episode. No other acute intervention is acquired at this point in time. For now, we will continue to follow his LFTs. He will have a followup hepatic panel obtained today. He is currently without a primary care provider. He  does not follow with infectious disease or gastroenterology. I will check with one of our gastroenterologists to see if they would be able to follow him for his hepatitis C versus referring him to the hepatitis C clinic.  3. Alcohol Abuse:   He is currently refraining from drinking Alcohol.  4. Tobacco Abuse:   He is trying to quit. I reminded him that he should not be smoking with nicotine patch in place.  5. Thrombocytopenia:   This is likely related to sequestration from his liver disease.  Recent platelet count stable. He will require periodic CBCs to follow up on hemoglobin and platelet count in the setting of dual antiplatelet therapy.  6. Bradycardia:   He is asymptomatic from this. Continue current dose of beta blocker.  Luna Glasgow, PA-C  12:40 PM 01/14/2012

## 2012-01-15 ENCOUNTER — Telehealth: Payer: Self-pay | Admitting: *Deleted

## 2012-01-15 ENCOUNTER — Encounter: Payer: Self-pay | Admitting: *Deleted

## 2012-01-15 DIAGNOSIS — K769 Liver disease, unspecified: Secondary | ICD-10-CM

## 2012-01-15 DIAGNOSIS — I2510 Atherosclerotic heart disease of native coronary artery without angina pectoris: Secondary | ICD-10-CM

## 2012-01-15 NOTE — Telephone Encounter (Signed)
Hunter Mcmillan, this man will need an appointment , no rush, OK in December. Chronic liver disease. Thanx ----- Message ----- From: Beatrice Lecher, PA Sent: 01/14/2012 1:29 PM To: Hart Carwin, MD Verlee Monte Will you review my note on this patient and let me know if you think it would be appropriate to refer him to GI for chronic follow up or have him referred to the Hep C clinic? Thank you, Scott   Scheduled patient on 02/13/12 at 3:00PM. Does he need labs prior to OV?

## 2012-01-15 NOTE — Telephone Encounter (Signed)
lmom lft better, repeat lft w/tsh and free t4 to be done on 01/22/12

## 2012-01-15 NOTE — Telephone Encounter (Signed)
Labs in EPIC. Left a message for patient to call me. 

## 2012-01-15 NOTE — Telephone Encounter (Signed)
Message copied by Tarri Fuller on Thu Jan 15, 2012 12:46 PM ------      Message from: [place], [place] T      Created: Wed Jan 14, 2012  5:35 PM       LFTs better.      Repeat Hepatic function panel in 5 days.      Of note TSH low in hospital - arrange TSH and Free T4 with labs next week.      Tereso Newcomer, PA-C  5:35 PM 01/14/2012

## 2012-01-15 NOTE — Telephone Encounter (Signed)
Please obtain Hep C RNA by PCR quantitative, ammonia, Protime, IgG,IgM, LFT's,

## 2012-01-15 NOTE — Telephone Encounter (Signed)
Message copied by Tarri Fuller on Thu Jan 15, 2012 12:06 PM ------      Message from: [place], [place] T      Created: Wed Jan 14, 2012  5:35 PM       LFTs better.      Repeat Hepatic function panel in 5 days.      Of note TSH low in hospital - arrange TSH and Free T4 with labs next week.      Tereso Newcomer, PA-C  5:35 PM 01/14/2012

## 2012-01-16 NOTE — Telephone Encounter (Signed)
Spoke with patient and gave him his appointment date and time. He will have labs done prior to OV.

## 2012-01-22 ENCOUNTER — Other Ambulatory Visit (INDEPENDENT_AMBULATORY_CARE_PROVIDER_SITE_OTHER): Payer: Self-pay

## 2012-01-22 DIAGNOSIS — I2510 Atherosclerotic heart disease of native coronary artery without angina pectoris: Secondary | ICD-10-CM

## 2012-01-22 DIAGNOSIS — B1920 Unspecified viral hepatitis C without hepatic coma: Secondary | ICD-10-CM

## 2012-01-22 DIAGNOSIS — I498 Other specified cardiac arrhythmias: Secondary | ICD-10-CM

## 2012-01-22 LAB — TSH: TSH: 0.14 u[IU]/mL — ABNORMAL LOW (ref 0.35–5.50)

## 2012-01-22 LAB — HEPATIC FUNCTION PANEL
ALT: 110 U/L — ABNORMAL HIGH (ref 0–53)
AST: 132 U/L — ABNORMAL HIGH (ref 0–37)
Albumin: 3.7 g/dL (ref 3.5–5.2)
Alkaline Phosphatase: 101 U/L (ref 39–117)
Bilirubin, Direct: 0.3 mg/dL (ref 0.0–0.3)
Total Bilirubin: 1 mg/dL (ref 0.3–1.2)
Total Protein: 8 g/dL (ref 6.0–8.3)

## 2012-01-22 LAB — T4, FREE: Free T4: 1.27 ng/dL (ref 0.60–1.60)

## 2012-01-23 ENCOUNTER — Telehealth: Payer: Self-pay | Admitting: *Deleted

## 2012-01-23 NOTE — Telephone Encounter (Signed)
lft, T4 ok, TSH low. need to f/u w/PCP. advised to stay off the atorvastatin until seen by Dr. Shirlee Latch or Dr. Juanda Chance.

## 2012-01-23 NOTE — Telephone Encounter (Signed)
Message copied by Tarri Fuller on Fri Jan 23, 2012 12:33 PM ------      Message from: [place], [place] T      Created: Thu Jan 22, 2012  5:13 PM       LFTs stable      Remain off of Atorvastatin until seen by Dr. [name] or Dr. Juanda Chance      TSH low and T4 ok.        -  Send labs to PCP and follow up there for management of thyroid        -  If he does not have a PCP check TSH, Free T4 and Free T3 in 4 weeks        -  If he does not have a PCP, please refer him      Tereso Newcomer, PA-C  5:13 PM 01/22/2012

## 2012-01-23 NOTE — Telephone Encounter (Signed)
no answer I will try back later

## 2012-01-26 NOTE — Telephone Encounter (Signed)
lmom x3 lft, T4 ok and TSH low and to f/u w/PCP, stay off lipitor until seen by DR, Shirlee Latch or Dr. Juanda Chance

## 2012-02-13 ENCOUNTER — Encounter: Payer: Self-pay | Admitting: Internal Medicine

## 2012-02-13 ENCOUNTER — Other Ambulatory Visit (INDEPENDENT_AMBULATORY_CARE_PROVIDER_SITE_OTHER): Payer: Self-pay

## 2012-02-13 ENCOUNTER — Ambulatory Visit (INDEPENDENT_AMBULATORY_CARE_PROVIDER_SITE_OTHER): Payer: Self-pay | Admitting: Internal Medicine

## 2012-02-13 ENCOUNTER — Telehealth: Payer: Self-pay | Admitting: *Deleted

## 2012-02-13 VITALS — BP 132/64 | HR 80 | Ht 71.0 in | Wt 164.0 lb

## 2012-02-13 DIAGNOSIS — R932 Abnormal findings on diagnostic imaging of liver and biliary tract: Secondary | ICD-10-CM

## 2012-02-13 LAB — HEPATIC FUNCTION PANEL
ALT: 130 U/L — ABNORMAL HIGH (ref 0–53)
AST: 120 U/L — ABNORMAL HIGH (ref 0–37)
Albumin: 3.6 g/dL (ref 3.5–5.2)
Alkaline Phosphatase: 133 U/L — ABNORMAL HIGH (ref 39–117)
Bilirubin, Direct: 0.2 mg/dL (ref 0.0–0.3)
Total Bilirubin: 0.7 mg/dL (ref 0.3–1.2)
Total Protein: 7.8 g/dL (ref 6.0–8.3)

## 2012-02-13 LAB — IGA: IgA: 392 mg/dL — ABNORMAL HIGH (ref 68–378)

## 2012-02-13 LAB — AMMONIA: Ammonia: 25 umol/L (ref 11–35)

## 2012-02-13 NOTE — Telephone Encounter (Signed)
02/13/2012 RE: Hunter Mcmillan DOB: 24-Oct-1950 MRN: [id number]  Dear Dr Shirlee Latch,   We have suggested the above patient have an endoscopy in February 2014. Our records show that he is on anticoagulation therapy.   Please give clearance for this patient to undergo endoscopy with propofol sedation following a non-ST elevated myocardial infarction in 01/2012. We plan to have him continue Plavix throughout the procedure.  Please route clearance to Vernia Buff, CMA.   Sincerely,  Vernia Buff

## 2012-02-13 NOTE — Patient Instructions (Addendum)
Your physician has requested that you go to the basement for the following lab work before leaving today: Hepatitis C RNA-PCR Quantitative, Ammonia, IgG, IgA, AFP, Hepatic Function Panel  We have sent the following medications to your pharmacy for you to pick up at your convenience: Prilosec 20 mg daily Folic Acid daily Celexa 20 mg   It has been recommended to you by your physician that you have a(n) endoscopy ON plavix (per Dr Juanda Chance) completed in February. Per your request, we did not schedule the procedure(s) today. Please contact our office at [phone] once you find out about insurance coverage. We will also get clearance from your cardiologist to make sure it is okay for you to go through with procedure.  CC: Dr [name]

## 2012-02-13 NOTE — Progress Notes (Signed)
Hunter Mcmillan 05/06/1950 MRN [id number]  History of Present Illness:  This is a 61 year old white male who referred by Lilian Coma in cardiology for evaluation of abnormal liver function tests. He has a history of ongoing alcohol abuse. He supposedly stopped 6 weeks ago but he admits to drinking  up to 12 beers per weekend. He also has hepatitis C which was treated at [place] in 2004 with ribavirin and interferon. He had only 6 months treatment because of leukopenialow, therefore he could not complete 12 months treatment. His viral load has not been checked since 2004. He has never had GI bleeding, pancreatitis, jaundice,or ascites. Since his MI 6 weeks ago, he has been on Plavix and has reported some easy bruising although his prothrombin time in the hospital was normal. He has thrombocytopenia but a normal sized spleen on upper abdominal ultrasound. Last month, his liver showed hepatocellular disease. He has never had a liver biopsy. He has occasional dysphagia to solids. He has also had heartburn and he has had several episodes of regurgitating food but no hematemesis. His license was suspended indefinitely because of DWI's and he is currently driving a moped. He has been to 2 alcohol rehabilitation centers, each were mandatory for him to obtain his driver's license. Marland Kitchen He lives by himself. Since his hospitalization, he has started a low-fat diet but has been unable to take statins because of his abnormal liver function tests. He has been on Celexa. He had a screening colonoscopy 1 year ago in [place] which was normal.    Past Medical History  Diagnosis Date  . Depression   . Hepatitis C     a. s/p [name] ~ 2003 in [place], [place] => stopped after 6 mos due to neutropenia - viral load noted to be low at that time;  no follow up since;   b. abdominal u/s 11/13:  diffuse hepatic steatosis and/or hepatocellular disease    . ETOH abuse   . Tobacco abuse   . CAD (coronary  artery disease) 11/13    a. 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 Hx of echocardiogram     a. Echo 01/06/12: Mild focal basal hypertrophy the septum, vigorous LV, EF 65-70%, Gr 2 diast dysfn, trivial AI, MAC, trivial MR, trivial TR, PASP 36  . Thrombocytopenia   . Non-STEMI (non-ST elevated myocardial infarction)   . Hepatic steatosis   . Heart attack    History reviewed. No pertinent past surgical history.  reports that he has been smoking Cigarettes.  He has been smoking about .1 packs per day. He has never used smokeless tobacco. He reports that he drinks alcohol. He reports that he does not use illicit drugs. family history includes Heart attack (age of onset:60) in his mother. No Known Allergies      Review of Systems:dysphagia, normal bowl habits  The remainder of the 10 point ROS is negative except as outlined in H&P   Physical Exam: General appearance  Well developed, in no distress.Very pleasant and cooperative  Eyes- non icteric. HEENT nontraumatic, normocephalic. Mouth no lesions, tongue papillated, no cheilosis. Neck supple without adenopathy, thyroid not enlarged, no carotid bruits, no JVD. Lungs Clear to auscultation bilaterally. Cor normal S1, normal S2, regular rhythm, no murmur,  quiet precordium. Abdomen: Soft nontender. Nondistended.  Liver edge at costal margin. No ascites. Spleen not palpable.  Rectal:Not done.  Extremities no pedal edema. Skin no lesions.Early Dupuytren's contractures no AVMs  Neurological alert and oriented x 3.No asterixis  Psychological normal mood and affect.  Assessment and Plan:  Problem #22 61 year old white male 6 weeks post non-ST elevation MI who is currently on Plavix and treated medically. He is unable to take statins because of abnormal  liver function tests. His ejection fraction is 65-70% by a recent echocardiogram.  Problem #2 Chronic liver disease from hepatitis C and likely alcohol. Patient is trying to abstain from alcohol but is still drinking on weekends. We will check his hepatitis C viral load, venous ammonia, alpha-fetoprotein, liver function tests, IgG and IgM. He will need an upper endoscopy when he is 3 months out of his MI. This will have to be done on Plavix. We need to check for esophageal varices to determine the risk of bleeding.He may also need dilation. We will start him on  Prilosec 20 mg daily..   02/13/2012 Lina Sar

## 2012-02-14 LAB — IGG: IgG (Immunoglobin G), Serum: 1660 mg/dL — ABNORMAL HIGH (ref 650–1600)

## 2012-02-14 LAB — AFP TUMOR MARKER: AFP-Tumor Marker: 20.4 ng/mL — ABNORMAL HIGH (ref 0.0–8.0)

## 2012-02-14 MED ORDER — OMEPRAZOLE 20 MG PO CPDR: 20.0000 mg | DELAYED_RELEASE_CAPSULE | Freq: Every day | ORAL | Status: DC

## 2012-02-14 MED ORDER — FOLIC ACID 1 MG PO TABS: 1.0000 mg | ORAL_TABLET | Freq: Every day | ORAL | Status: DC

## 2012-02-14 MED ORDER — CITALOPRAM HYDROBROMIDE 20 MG PO TABS: 20.0000 mg | ORAL_TABLET | Freq: Every day | ORAL | Status: DC

## 2012-02-15 ENCOUNTER — Encounter: Payer: Self-pay | Admitting: Internal Medicine

## 2012-02-15 NOTE — Telephone Encounter (Signed)
It does not appear to me that he is on coumadin.  If he can continue ASA and Plavix, should not be an issue.

## 2012-02-16 ENCOUNTER — Ambulatory Visit (INDEPENDENT_AMBULATORY_CARE_PROVIDER_SITE_OTHER): Payer: Self-pay | Admitting: Cardiology

## 2012-02-16 ENCOUNTER — Encounter: Payer: Self-pay | Admitting: Cardiology

## 2012-02-16 VITALS — BP 126/66 | HR 72 | Ht 71.0 in | Wt 170.0 lb

## 2012-02-16 DIAGNOSIS — I2510 Atherosclerotic heart disease of native coronary artery without angina pectoris: Secondary | ICD-10-CM

## 2012-02-16 DIAGNOSIS — F17200 Nicotine dependence, unspecified, uncomplicated: Secondary | ICD-10-CM

## 2012-02-16 DIAGNOSIS — Z720 Tobacco use: Secondary | ICD-10-CM

## 2012-02-16 DIAGNOSIS — B1920 Unspecified viral hepatitis C without hepatic coma: Secondary | ICD-10-CM

## 2012-02-16 LAB — CBC WITH DIFFERENTIAL/PLATELET
Basophils Absolute: 0 10*3/uL (ref 0.0–0.1)
Basophils Relative: 0.4 % (ref 0.0–3.0)
Eosinophils Absolute: 0.1 10*3/uL (ref 0.0–0.7)
Eosinophils Relative: 1.8 % (ref 0.0–5.0)
HCT: 44.9 % (ref 39.0–52.0)
Hemoglobin: 15.7 g/dL (ref 13.0–17.0)
Lymphocytes Relative: 24.4 % (ref 12.0–46.0)
Lymphs Abs: 1.5 10*3/uL (ref 0.7–4.0)
MCHC: 35 g/dL (ref 30.0–36.0)
MCV: 104.3 fl — ABNORMAL HIGH (ref 78.0–100.0)
Monocytes Absolute: 0.6 10*3/uL (ref 0.1–1.0)
Monocytes Relative: 9.6 % (ref 3.0–12.0)
Neutro Abs: 4 10*3/uL (ref 1.4–7.7)
Neutrophils Relative %: 63.8 % (ref 43.0–77.0)
Platelets: 114 10*3/uL — ABNORMAL LOW (ref 150.0–400.0)
RBC: 4.31 Mil/uL (ref 4.22–5.81)
RDW: 14.7 % — ABNORMAL HIGH (ref 11.5–14.6)
WBC: 6.2 10*3/uL (ref 4.5–10.5)

## 2012-02-16 LAB — T3, FREE: T3, Free: 2.4 pg/mL (ref 2.3–4.2)

## 2012-02-16 MED ORDER — PANTOPRAZOLE SODIUM 40 MG PO TBEC: 40.0000 mg | DELAYED_RELEASE_TABLET | Freq: Every day | ORAL | Status: DC

## 2012-02-16 NOTE — Telephone Encounter (Signed)
Dr Juanda Chance, Dr Shirlee Latch states that an endoscopy in February 2014 should be fine as long as patient can continue ASA and Plavix. Is continuing Plavix and ASA for procedure okay with you?

## 2012-02-16 NOTE — Telephone Encounter (Signed)
Yes, it is fine

## 2012-02-16 NOTE — Progress Notes (Signed)
Patient ID: Hunter Mcmillan, male   DOB: May 27, 1950, 61 y.o.   MRN: [id number] 61 yo with history of ETOH abuse, HVC/ETOH hepatitis, and CAD s/p recent NSTEMI presents for cardiology followup.  Patient was admitted in 11/13 with chest pain and was found to have NSTEMI.  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  This was a small caliber vessel with 95% stenosis that was the likely culprit for NSTEMI. Chest pain had resolved and there was TIMI 3 flow in the vessel.  Therefore, given the small caliber, it was decided to manage him medically.  EF was preserved on echo.   He has done reasonably well since discharge.  He has stopped drinking ETOH.  He is still smoking but has cut back to about 1-2 cigarettes/day.  His LFTs rose after starting atorvastatin so this has been held.  He has been seeing Dr. Juanda Chance for hepatic evaluation and will need an EGD eventually.  He has had no chest pain since discharge. No exertional dyspnea.  He is tolerating Plavix without problems.  He has mild chronic thrombocytopenia.  No evidence for GI bleeding.    Labs (11/13): K 3.9, creatinine 0.9 Labs (12/13): AST 120, ALT 130, TSH 0.14, free T4 normal.   PMH: 1. Depression 2. ETOH and HCV hepatitis: Hepatic steatosis on 11/13 abdominal US.  Elevated LFTs.  3. Smoker 4. HCV: Treated in 2004 with interferon/ribavirin, stopped due to neutropenia.  5. Thrombocytopenia: Chronic, likely due to HCV and ETOH marrow suppression.  6. CAD: NSTEMI 11/13.  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 Medically managed.  Echo (11/13) with EF 65-70%, grade II diastolic dysfunction, PA systolic pressure 36 mmHg.   SH: Smokes 1/2 ppd.  History of ETOH abuse, has quit since discharged from hospital in 11/13.  Single.  Lives in [place].   FH: Mother with MI at 67.   ROS: All systems reviewed and negative except as per HPI.   Current Outpatient Prescriptions  Medication Sig  Dispense Refill  . aspirin EC 81 MG EC tablet Take 1 tablet (81 mg total) by mouth daily.      . citalopram (CELEXA) 20 MG tablet Take 1 tablet (20 mg total) by mouth daily at 12 noon.  30 tablet  1  . clopidogrel (PLAVIX) 75 MG tablet Take 1 tablet (75 mg total) by mouth daily with breakfast.  30 tablet  3  . folic acid (FOLVITE) 1 MG tablet Take 1 tablet (1 mg total) by mouth daily.  100 tablet  0  . lisinopril (PRINIVIL,ZESTRIL) 5 MG tablet Take 1 tablet (5 mg total) by mouth daily.  30 tablet  3  . metoprolol tartrate (LOPRESSOR) 12.5 mg TABS Take 0.5 tablets (12.5 mg total) by mouth 2 (two) times daily.  30 tablet  3  . Multiple Vitamin (MULTIVITAMIN WITH MINERALS) TABS Take 1 tablet by mouth daily.      . nitroGLYCERIN (NITROSTAT) 0.4 MG SL tablet Place 1 tablet (0.4 mg total) under the tongue every 5 (five) minutes x 3 doses as needed for chest pain.  25 tablet  3  . pantoprazole (PROTONIX) 40 MG tablet Take 1 tablet (40 mg total) by mouth daily.  30 tablet  11   BP 126/66  Pulse 72  Ht 5\' 11"  (1.803 m)  Wt 170 lb (77.111 kg)  BMI 23.71 kg/m2 General: NAD Neck: No JVD, no thyromegaly or thyroid nodule.  Lungs:  Clear to auscultation bilaterally with normal respiratory effort. CV: Nondisplaced PMI.  Heart regular S1/S2, no S3/S4, no murmur.  No peripheral edema.  No carotid bruit.  Normal pedal pulses.  Abdomen: Soft, nontender, no hepatosplenomegaly, no distention.  Skin: Intact without lesions or rashes.  Neurologic: Alert and oriented x 3.  Psych: Normal affect. Extremities: No clubbing or cyanosis.  HEENT: Normal.   Assessment/Plan: 1. CAD: s/p NSTEMI, medically managed due to small caliber culprit vessel (anomalous LCx).  EF preserved on echo.  Continue ASA 81, Plavix x 1 year post NSTEMI, lisinopril, metoprolol.  He is off statin for now with rise in transaminases.  I will leave him off statin for now but will re-address with Dr. Juanda Chance starting a low dose statin in the future.  I will also have him stop omeprazole given possible interaction with Plavix.  I will give him a prescription for Protonix instead.  2. Smoking: I strongly encouraged him to quit.  He has cut back.  3. ETOH abuse: He has stopped drinking.  4. Hepatitis: ETOH and HCV related.  Did not have evidence for cirrhosis on abdominal US.  Plan for EGD per Dr. Juanda Chance.  Would wait until 3 months post-MI, would be ok to hold Plavix prior to procedure and restart afterwards (did not have PCI).   Hunter Mcmillan 02/17/2012

## 2012-02-16 NOTE — Patient Instructions (Addendum)
Stop omeprazole.   Start protonix 40mg  daily.  Your physician recommends that you have  lab work today--CBC-d/Free T3.  You have been referred to the [place] office to establish with a doctor there for your primary care.   Your physician wants you to follow-up in: 4 months with Dr Shirlee Latch. (April 2014). You will receive a reminder letter in the mail two months in advance. If you don't receive a letter, please call our office to schedule the follow-up appointment.

## 2012-02-19 ENCOUNTER — Other Ambulatory Visit: Payer: Self-pay | Admitting: *Deleted

## 2012-02-19 DIAGNOSIS — B1920 Unspecified viral hepatitis C without hepatic coma: Secondary | ICD-10-CM

## 2012-02-19 LAB — HCV REAL-TIME, PCR, QUANT
Hepatitis C Quantitation: 7404460 IU/mL
log 10: 6.869 log10 IU/mL

## 2012-02-19 LAB — HEPATITIS C RNA, QUANTITATIVE, PCR: Hepatitis C RNA-PCR: POSITIVE — AB

## 2012-04-09 ENCOUNTER — Encounter: Payer: Self-pay | Admitting: Family Medicine

## 2012-04-09 ENCOUNTER — Ambulatory Visit (INDEPENDENT_AMBULATORY_CARE_PROVIDER_SITE_OTHER): Payer: Self-pay | Admitting: Family Medicine

## 2012-04-09 VITALS — BP 118/68 | HR 66 | Temp 98.4°F | Ht 70.0 in | Wt 167.4 lb

## 2012-04-09 DIAGNOSIS — R7989 Other specified abnormal findings of blood chemistry: Secondary | ICD-10-CM | POA: Insufficient documentation

## 2012-04-09 DIAGNOSIS — F1010 Alcohol abuse, uncomplicated: Secondary | ICD-10-CM

## 2012-04-09 DIAGNOSIS — F329 Major depressive disorder, single episode, unspecified: Secondary | ICD-10-CM

## 2012-04-09 DIAGNOSIS — Z720 Tobacco use: Secondary | ICD-10-CM

## 2012-04-09 DIAGNOSIS — F17200 Nicotine dependence, unspecified, uncomplicated: Secondary | ICD-10-CM

## 2012-04-09 DIAGNOSIS — B1920 Unspecified viral hepatitis C without hepatic coma: Secondary | ICD-10-CM

## 2012-04-09 DIAGNOSIS — I2510 Atherosclerotic heart disease of native coronary artery without angina pectoris: Secondary | ICD-10-CM

## 2012-04-09 DIAGNOSIS — R6889 Other general symptoms and signs: Secondary | ICD-10-CM

## 2012-04-09 NOTE — Patient Instructions (Signed)
Schedule your complete physical in 3-4 months We'll call you with your Hepatitis appt Congrats on quitting drinking!!! You're almost smoke free, you can do it! Call and schedule an appt w/ cardiology Call with any questions or concerns Happy early birthday!!!

## 2012-04-09 NOTE — Assessment & Plan Note (Signed)
New to provider.  Following w/ Cards since MI in November.  Goal is risk reduction w/ BP, lipids, smoking cessation.  Pt reports during OV that he had TIA/CVA in March but this is very confusing.  1st he indicated November 2013, then realized that was wrong and said March 2013.  Pt states he was admitted at [facility].  No admit for March 2013.  There is admission from 2012 but this was also for CP.  No records for stroke.  Goal would still be risk reduction.  Will follow along and assist as able.

## 2012-04-09 NOTE — Assessment & Plan Note (Signed)
New to provider.  Pt had recent free T3/T4 checked- normal.  Pt reports he has hx of fluctuating levels.  Will follow at future visit but no need to repeat labs today.

## 2012-04-09 NOTE — Assessment & Plan Note (Signed)
New to provider, chronic for pt.  Pt has dramatically decreased his smoking.  Applauded his efforts and encouraged him to be smoke free.

## 2012-04-09 NOTE — Progress Notes (Signed)
  Subjective:    Patient ID: Hunter Mcmillan, male    DOB: 08-Jun-1950, 62 y.o.   MRN: [id number]  HPI New to establish.  Previous MD- in [place], last seen 3+ yrs ago.  Cards- Shirlee Latch, GIDickie La  CAD- following w/ Dr Shirlee Latch, currently on ACE, beta blocker, ASA, Plavix.  Was on Lipitor but was told this was 'running the #s in the wrong direction'.  No CP, SOB, HAs, visual changes, edeam.  Depression- chronic problem, currently on Celexa.  Not in therapy.  Feels sxs are currently well controlled.  Lives alone.  Tobacco Use- 'i've almost completely quit'.  Smoking <3 cigs/day.  Alcohol abuse- pt reports this was compounding depression, stopped drinking ~40 days ago.  Currently in AA.  Pt's sponsor currently has dementia so he is looking into a new sponsor.  Hep C- previously had interferon tx but had to stop due to neutropenia.  Has had recent lab work done w/ Dr Dickie La.  Not established w/ Hep C.  Viral load was 'medium high' according to Dr Marian Sorrow result note.  TIA- occurred in March 2013.  No residuals.  Not following w/ neuro.    Review of Systems For ROS see HPI     Objective:   Physical Exam  Vitals reviewed. Constitutional: He is oriented to person, place, and time. He appears well-developed and well-nourished. No distress.       Has a strange, strong odor  HENT:  Head: Normocephalic and atraumatic.  Eyes: Conjunctivae normal and EOM are normal. Pupils are equal, round, and reactive to light.  Neck: Normal range of motion. Neck supple. No thyromegaly present.  Cardiovascular: Normal rate, regular rhythm, normal heart sounds and intact distal pulses.   No murmur heard. Pulmonary/Chest: Effort normal and breath sounds normal. No respiratory distress. He has no wheezes. He has no rales.  Abdominal: Soft. Bowel sounds are normal. He exhibits no distension. There is no tenderness. There is no rebound.       No hepatomegaly  Musculoskeletal: He exhibits no edema.  Lymphadenopathy:   He has no cervical adenopathy.  Neurological: He is alert and oriented to person, place, and time. No cranial nerve deficit.  Skin: Skin is warm and dry.  Psychiatric: His behavior is normal.       Flat affect          Assessment & Plan:

## 2012-04-09 NOTE — Assessment & Plan Note (Signed)
New.  Pt had large viral load when he had labs done at GI.  Will need referral to Hep C clinic for ongoing tx.

## 2012-04-09 NOTE — Assessment & Plan Note (Signed)
New to provider, ongoing for pt.  Pt feels sxs are currently well controlled on Celexa.  Will follow.

## 2012-04-09 NOTE — Assessment & Plan Note (Signed)
New.  Pt reports he has been sober for ~40 days.  In AA.  Currently searching for new sponsor.  Will follow.

## 2012-06-17 ENCOUNTER — Encounter: Payer: Self-pay | Admitting: Family Medicine

## 2012-06-23 ENCOUNTER — Telehealth: Payer: Self-pay | Admitting: Family Medicine

## 2012-06-23 NOTE — Telephone Encounter (Signed)
In reference to [facility] entered on 04/09/12, patient has never scheduled.  Per call from Lareka from [facility], they have attempted to reach patient multiple times, left 3 messages, and patient is not responding to them.  I have also attempted to reach patient by phone & mailed him a letter.  As of today, 06/23/12, patient will not respond.

## 2012-06-23 NOTE — Telephone Encounter (Signed)
Noted  

## 2012-07-19 ENCOUNTER — Encounter: Payer: Self-pay | Admitting: Family Medicine

## 2012-07-19 ENCOUNTER — Ambulatory Visit: Payer: BC Managed Care – PPO

## 2012-07-19 ENCOUNTER — Ambulatory Visit (INDEPENDENT_AMBULATORY_CARE_PROVIDER_SITE_OTHER): Payer: BC Managed Care – PPO | Admitting: Family Medicine

## 2012-07-19 VITALS — BP 110/70 | HR 73 | Temp 97.6°F | Ht 70.0 in | Wt 162.0 lb

## 2012-07-19 DIAGNOSIS — B1920 Unspecified viral hepatitis C without hepatic coma: Secondary | ICD-10-CM

## 2012-07-19 DIAGNOSIS — Z0000 Encounter for general adult medical examination without abnormal findings: Secondary | ICD-10-CM

## 2012-07-19 LAB — LIPID PANEL
Cholesterol: 164 mg/dL (ref 0–200)
HDL: 35.3 mg/dL — ABNORMAL LOW (ref >39.00)
LDL Cholesterol: 102 mg/dL — ABNORMAL HIGH (ref 0–99)
Total CHOL/HDL Ratio: 5
Triglycerides: 135 mg/dL (ref 0.0–149.0)
VLDL: 27 mg/dL (ref 0.0–40.0)

## 2012-07-19 LAB — CBC WITH DIFFERENTIAL/PLATELET
Basophils Absolute: 0 10*3/uL (ref 0.0–0.1)
Basophils Relative: 0.3 % (ref 0.0–3.0)
Eosinophils Absolute: 0.1 10*3/uL (ref 0.0–0.7)
Eosinophils Relative: 2.1 % (ref 0.0–5.0)
HCT: 49.8 % (ref 39.0–52.0)
Hemoglobin: 17.3 g/dL — ABNORMAL HIGH (ref 13.0–17.0)
Lymphocytes Relative: 39 % (ref 12.0–46.0)
Lymphs Abs: 2.2 10*3/uL (ref 0.7–4.0)
MCHC: 34.7 g/dL (ref 30.0–36.0)
MCV: 102 fl — ABNORMAL HIGH (ref 78.0–100.0)
Monocytes Absolute: 0.5 10*3/uL (ref 0.1–1.0)
Monocytes Relative: 8.8 % (ref 3.0–12.0)
Neutro Abs: 2.8 10*3/uL (ref 1.4–7.7)
Neutrophils Relative %: 49.8 % (ref 43.0–77.0)
Platelets: 109 10*3/uL — ABNORMAL LOW (ref 150.0–400.0)
RBC: 4.88 Mil/uL (ref 4.22–5.81)
RDW: 14.7 % — ABNORMAL HIGH (ref 11.5–14.6)
WBC: 5.7 10*3/uL (ref 4.5–10.5)

## 2012-07-19 LAB — HEPATIC FUNCTION PANEL
ALT: 119 U/L — ABNORMAL HIGH (ref 0–53)
AST: 126 U/L — ABNORMAL HIGH (ref 0–37)
Albumin: 3.5 g/dL (ref 3.5–5.2)
Alkaline Phosphatase: 145 U/L — ABNORMAL HIGH (ref 39–117)
Bilirubin, Direct: 0.2 mg/dL (ref 0.0–0.3)
Total Bilirubin: 0.5 mg/dL (ref 0.3–1.2)
Total Protein: 7.6 g/dL (ref 6.0–8.3)

## 2012-07-19 LAB — BASIC METABOLIC PANEL
BUN: 11 mg/dL (ref 6–23)
CO2: 27 mEq/L (ref 19–32)
Calcium: 9.1 mg/dL (ref 8.4–10.5)
Chloride: 103 mEq/L (ref 96–112)
Creatinine, Ser: 0.9 mg/dL (ref 0.4–1.5)
GFR: 88.26 mL/min (ref >60.00)
Glucose, Bld: 79 mg/dL (ref 70–99)
Potassium: 4.2 mEq/L (ref 3.5–5.1)
Sodium: 138 mEq/L (ref 135–145)

## 2012-07-19 LAB — TSH: TSH: 0.26 u[IU]/mL — ABNORMAL LOW (ref 0.35–5.50)

## 2012-07-19 LAB — PSA: PSA: 1.17 ng/mL (ref 0.10–4.00)

## 2012-07-19 NOTE — Progress Notes (Signed)
  Subjective:    Patient ID: Hunter Mcmillan, male    DOB: December 06, 1950, 62 y.o.   MRN: [id number]  HPI CPE- pt has not established w/ Hep C clinic.  Following w/ GI and cards regularly.  Reports feeling well.  No concerns today.   Review of Systems Patient reports no vision/hearing changes, anorexia, fever ,adenopathy, persistant/recurrent hoarseness, swallowing issues, chest pain, palpitations, edema, persistant/recurrent cough, hemoptysis, dyspnea (rest,exertional, paroxysmal nocturnal), gastrointestinal  bleeding (melena, rectal bleeding), abdominal pain, excessive heart burn, GU symptoms (dysuria, hematuria, voiding/incontinence issues) syncope, focal weakness, memory loss, numbness & tingling, skin/hair/nail changes, depression, anxiety, abnormal bruising/bleeding, musculoskeletal symptoms/signs.     Objective:   Physical Exam BP 110/70  Pulse 73  Temp(Src) 97.6 F (36.4 C) (Oral)  Ht 5\' 10"  (1.778 m)  Wt 162 lb (73.483 kg)  BMI 23.24 kg/m2  SpO2 95%  General Appearance:    Alert, cooperative, no distress, appears stated age  Head:    Normocephalic, without obvious abnormality, atraumatic  Eyes:    PERRL, conjunctiva/corneas clear, EOM's intact, fundi    benign, both eyes       Ears:    Normal TM's and external ear canals, both ears  Nose:   Nares normal, septum midline, mucosa normal, no drainage   or sinus tenderness  Throat:   Lips, mucosa, and tongue normal; teeth and gums normal  Neck:   Supple, symmetrical, trachea midline, no adenopathy;       thyroid:  No enlargement/tenderness/nodules  Back:     Symmetric, no curvature, ROM normal, no CVA tenderness  Lungs:     Clear to auscultation bilaterally, respirations unlabored  Chest wall:    No tenderness or deformity  Heart:    Regular rate and rhythm, S1 and S2 normal, no murmur, rub   or gallop  Abdomen:     Soft, non-tender, bowel sounds active all four quadrants,    no masses, no organomegaly  Genitalia:    Normal male  without lesion, discharge or tenderness  Rectal:    Normal tone, normal prostate, no masses or tenderness  Extremities:   Extremities normal, atraumatic, no cyanosis or edema  Pulses:   2+ and symmetric all extremities  Skin:   Skin color, texture, turgor normal, no rashes or lesions  Lymph nodes:   Cervical, supraclavicular, and axillary nodes normal  Neurologic:   CNII-XII intact. Normal strength, sensation and reflexes      throughout          Assessment & Plan:

## 2012-07-19 NOTE — Patient Instructions (Addendum)
We'll notify you of your lab results and make any changes if needed We'll re-refer to the Hep C clinic Call with any questions or concerns Have a great summer!

## 2012-07-20 LAB — T4, FREE: Free T4: 1.01 ng/dL (ref 0.60–1.60)

## 2012-07-20 LAB — T3, FREE: T3, Free: 2.7 pg/mL (ref 2.3–4.2)

## 2012-07-20 NOTE — Assessment & Plan Note (Signed)
Pt's PE WNL.  UTD on health maintenance.  Will re-refer to Hep C clinic now that pt has insurance.  Check labs.  Anticipatory guidance provided.

## 2012-07-22 ENCOUNTER — Encounter: Payer: Self-pay | Admitting: General Practice

## 2012-07-27 ENCOUNTER — Telehealth: Payer: Self-pay | Admitting: *Deleted

## 2012-07-27 DIAGNOSIS — D582 Other hemoglobinopathies: Secondary | ICD-10-CM

## 2012-07-27 NOTE — Telephone Encounter (Signed)
Message copied by Verdie Shire on Tue Jul 27, 2012 10:01 AM ------      Message from: Sheliah Hatch      Created: Mon Jul 19, 2012  4:59 PM       TSH is low- please add free T3/T4 (dx abnormal TSH)      Hemoglobin is elevated- needs to increase his water intake (this can appear high when dehydrated)      Liver functions remain elevated but appear stable            Will need to repeat CBC in 1 month (dx elevated hemoglobin) ------

## 2012-07-27 NOTE — Telephone Encounter (Signed)
Spoke with the pt and informed him of recent lab results and note.  Pt understood and agreed.  Also informed the pt that he will be receiving a letter regarding his T3/T4 results.  Pt was scheduled to have a repeat CBC in 1 mos, and lab ordered and sent.//AB/CMA

## 2012-08-09 ENCOUNTER — Encounter: Payer: Self-pay | Admitting: Family Medicine

## 2012-08-17 ENCOUNTER — Telehealth: Payer: Self-pay | Admitting: Family Medicine

## 2012-08-17 NOTE — Telephone Encounter (Signed)
In reference to 2nd [facility] C [facility] referral entered on 07/20/12, patient will not schedule. Per Hep C Clinic, they have attempted to reach patient by phone multiple times, left 3 messages, and mailed patient a letter.  I have also attempted to reach patient by phone & mailed him a letter. As of today, 08/17/12, patient will not respond.

## 2012-08-23 ENCOUNTER — Telehealth: Payer: Self-pay | Admitting: *Deleted

## 2012-08-23 NOTE — Telephone Encounter (Signed)
Left a message for patient to call me. 

## 2012-08-23 NOTE — Telephone Encounter (Signed)
Message copied by Daphine Deutscher on Mon Aug 23, 2012 11:04 AM ------      Message from: Daphine Deutscher      Created: Thu Feb 19, 2012  9:51 AM       Call and remind patient AFP due on 08/23/12 DB ------

## 2012-08-24 NOTE — Telephone Encounter (Signed)
Left a message for patient to call me. 

## 2012-08-26 ENCOUNTER — Encounter: Payer: Self-pay | Admitting: *Deleted

## 2012-08-26 NOTE — Telephone Encounter (Signed)
Letter mailed to patient.

## 2012-08-27 ENCOUNTER — Other Ambulatory Visit (INDEPENDENT_AMBULATORY_CARE_PROVIDER_SITE_OTHER): Payer: BC Managed Care – PPO

## 2012-08-27 ENCOUNTER — Encounter: Payer: Self-pay | Admitting: *Deleted

## 2012-08-27 DIAGNOSIS — D582 Other hemoglobinopathies: Secondary | ICD-10-CM

## 2012-08-27 DIAGNOSIS — K769 Liver disease, unspecified: Secondary | ICD-10-CM

## 2012-08-27 LAB — CBC WITH DIFFERENTIAL/PLATELET
Basophils Absolute: 0.1 10*3/uL (ref 0.0–0.1)
Basophils Relative: 1.3 % (ref 0.0–3.0)
Eosinophils Absolute: 0.2 10*3/uL (ref 0.0–0.7)
Eosinophils Relative: 3.4 % (ref 0.0–5.0)
HCT: 49.8 % (ref 39.0–52.0)
Hemoglobin: 17.3 g/dL — ABNORMAL HIGH (ref 13.0–17.0)
Lymphocytes Relative: 51.9 % — ABNORMAL HIGH (ref 12.0–46.0)
Lymphs Abs: 2.8 10*3/uL (ref 0.7–4.0)
MCHC: 34.8 g/dL (ref 30.0–36.0)
MCV: 103.5 fl — ABNORMAL HIGH (ref 78.0–100.0)
Monocytes Absolute: 0.5 10*3/uL (ref 0.1–1.0)
Monocytes Relative: 9.3 % (ref 3.0–12.0)
Neutro Abs: 1.9 10*3/uL (ref 1.4–7.7)
Neutrophils Relative %: 34.1 % — ABNORMAL LOW (ref 43.0–77.0)
Platelets: 117 10*3/uL — ABNORMAL LOW (ref 150.0–400.0)
RBC: 4.82 Mil/uL (ref 4.22–5.81)
RDW: 14.6 % (ref 11.5–14.6)
WBC: 5.5 10*3/uL (ref 4.5–10.5)

## 2012-09-13 ENCOUNTER — Telehealth: Payer: Self-pay | Admitting: *Deleted

## 2012-09-13 NOTE — Telephone Encounter (Signed)
Patient is due for AFP to be drawn. Called patient and mailed patient a letter requesting he have labs done. Patient has not had lab work done.

## 2012-09-13 NOTE — Telephone Encounter (Signed)
Message copied by Daphine Deutscher on Mon Sep 13, 2012  8:29 AM ------      Message from: Daphine Deutscher      Created: Thu Aug 26, 2012  9:38 AM       Did patient have labs for DB?Letter mailed. ------

## 2012-09-17 ENCOUNTER — Other Ambulatory Visit (INDEPENDENT_AMBULATORY_CARE_PROVIDER_SITE_OTHER): Payer: BC Managed Care – PPO

## 2012-09-17 DIAGNOSIS — B1920 Unspecified viral hepatitis C without hepatic coma: Secondary | ICD-10-CM

## 2012-09-17 DIAGNOSIS — K769 Liver disease, unspecified: Secondary | ICD-10-CM

## 2012-09-17 LAB — AMMONIA: Ammonia: 17 umol/L (ref 11–35)

## 2012-09-18 LAB — AFP TUMOR MARKER: AFP-Tumor Marker: 17 ng/mL — ABNORMAL HIGH (ref 0.0–8.0)

## 2012-09-18 LAB — IGM: IgM, Serum: 258 mg/dL — ABNORMAL HIGH (ref 41–251)

## 2012-09-18 LAB — IGG: IgG (Immunoglobin G), Serum: 1820 mg/dL — ABNORMAL HIGH (ref 650–1600)

## 2012-09-21 LAB — HEPATITIS C RNA QUANTITATIVE
HCV Quantitative Log: 6.24 {Log} — ABNORMAL HIGH (ref <1.18)
HCV Quantitative: 1724128 IU/mL — ABNORMAL HIGH (ref <15)

## 2012-10-27 ENCOUNTER — Emergency Department (HOSPITAL_COMMUNITY)
Admission: EM | Admit: 2012-10-27 | Discharge: 2012-10-28 | Disposition: A | Discharge status: Home / Self Care | Payer: BC Managed Care – PPO | Attending: Emergency Medicine | Admitting: Emergency Medicine

## 2012-10-27 DIAGNOSIS — I252 Old myocardial infarction: Secondary | ICD-10-CM | POA: Insufficient documentation

## 2012-10-27 DIAGNOSIS — Z8619 Personal history of other infectious and parasitic diseases: Secondary | ICD-10-CM | POA: Insufficient documentation

## 2012-10-27 DIAGNOSIS — Z8659 Personal history of other mental and behavioral disorders: Secondary | ICD-10-CM | POA: Insufficient documentation

## 2012-10-27 DIAGNOSIS — I2510 Atherosclerotic heart disease of native coronary artery without angina pectoris: Secondary | ICD-10-CM | POA: Insufficient documentation

## 2012-10-27 DIAGNOSIS — F17200 Nicotine dependence, unspecified, uncomplicated: Secondary | ICD-10-CM | POA: Insufficient documentation

## 2012-10-27 DIAGNOSIS — Z8719 Personal history of other diseases of the digestive system: Secondary | ICD-10-CM | POA: Insufficient documentation

## 2012-10-27 DIAGNOSIS — Z8674 Personal history of sudden cardiac arrest: Secondary | ICD-10-CM | POA: Insufficient documentation

## 2012-10-27 DIAGNOSIS — Z862 Personal history of diseases of the blood and blood-forming organs and certain disorders involving the immune mechanism: Secondary | ICD-10-CM | POA: Insufficient documentation

## 2012-10-27 DIAGNOSIS — G8929 Other chronic pain: Secondary | ICD-10-CM | POA: Insufficient documentation

## 2012-10-27 DIAGNOSIS — Z79899 Other long term (current) drug therapy: Secondary | ICD-10-CM | POA: Insufficient documentation

## 2012-10-27 DIAGNOSIS — F10929 Alcohol use, unspecified with intoxication, unspecified: Secondary | ICD-10-CM

## 2012-10-27 DIAGNOSIS — Z7982 Long term (current) use of aspirin: Secondary | ICD-10-CM | POA: Insufficient documentation

## 2012-10-27 DIAGNOSIS — F1010 Alcohol abuse, uncomplicated: Secondary | ICD-10-CM | POA: Insufficient documentation

## 2012-10-28 ENCOUNTER — Encounter (HOSPITAL_COMMUNITY): Payer: Self-pay | Admitting: Emergency Medicine

## 2012-10-28 LAB — COMPREHENSIVE METABOLIC PANEL
ALT: 117 U/L — ABNORMAL HIGH (ref 0–53)
AST: 140 U/L — ABNORMAL HIGH (ref 0–37)
Albumin: 3.5 g/dL (ref 3.5–5.2)
Alkaline Phosphatase: 107 U/L (ref 39–117)
BUN: 7 mg/dL (ref 6–23)
CO2: 23 mEq/L (ref 19–32)
Calcium: 9.1 mg/dL (ref 8.4–10.5)
Chloride: 100 mEq/L (ref 96–112)
Creatinine, Ser: 0.93 mg/dL (ref 0.50–1.35)
GFR calc Af Amer: >90 mL/min (ref >90)
GFR calc non Af Amer: 88 mL/min — ABNORMAL LOW (ref >90)
Glucose, Bld: 105 mg/dL — ABNORMAL HIGH (ref 70–99)
Potassium: 3.7 mEq/L (ref 3.5–5.1)
Sodium: 135 mEq/L (ref 135–145)
Total Bilirubin: 0.7 mg/dL (ref 0.3–1.2)
Total Protein: 7.6 g/dL (ref 6.0–8.3)

## 2012-10-28 LAB — CBC
HCT: 43.8 % (ref 39.0–52.0)
Hemoglobin: 15.9 g/dL (ref 13.0–17.0)
MCH: 35.7 pg — ABNORMAL HIGH (ref 26.0–34.0)
MCHC: 36.3 g/dL — ABNORMAL HIGH (ref 30.0–36.0)
MCV: 98.2 fL (ref 78.0–100.0)
Platelets: DECREASED 10*3/uL (ref 150–400)
RBC: 4.46 MIL/uL (ref 4.22–5.81)
RDW: 13.3 % (ref 11.5–15.5)
WBC: 6.5 10*3/uL (ref 4.0–10.5)

## 2012-10-28 LAB — RAPID URINE DRUG SCREEN, HOSP PERFORMED
Amphetamines: NOT DETECTED
Barbiturates: NOT DETECTED
Benzodiazepines: NOT DETECTED
Cocaine: NOT DETECTED
Opiates: NOT DETECTED
Tetrahydrocannabinol: NOT DETECTED

## 2012-10-28 LAB — ETHANOL: Alcohol, Ethyl (B): 242 mg/dL — ABNORMAL HIGH (ref 0–11)

## 2012-10-28 NOTE — ED Notes (Signed)
Pt brought to ED by [employer] after calling them to get help with etoh and crack. Pt states she wants the government to pay for his pain, pt states he is no physical pain at this time. Pt states his brain does not process physical pain. Pt denies SI/HI. Denies hallucinations.

## 2012-10-28 NOTE — ED Notes (Signed)
Post triage pt states he wishes God would strike him with lightning so he could die and would no longer be in pain. Pt states he does want to die but has no desire to kill himself. Pt states he has suffered depression since birth, pt states he was told by the nurses at birth he was bad.

## 2012-10-28 NOTE — ED Provider Notes (Signed)
CSN: [id number]     Arrival date & time 10/27/12  2355 History   First MD Initiated Contact with Patient 10/28/12 0030     Chief Complaint  Patient presents with  . Medical Clearance   (Consider location/radiation/quality/duration/timing/severity/associated sxs/prior Treatment) HPI  Past Medical History  Diagnosis Date  . Depression   . Hepatitis C     a. s/p [name] ~ 2003 in [place], [place] => stopped after 6 mos due to neutropenia - viral load noted to be low at that time;  no follow up since;   b. abdominal u/s 11/13:  diffuse hepatic steatosis and/or hepatocellular disease    . ETOH abuse   . Tobacco abuse   . CAD (coronary artery disease) 11/13    a. 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 Hx of echocardiogram     a. Echo 01/06/12: Mild focal basal hypertrophy the septum, vigorous LV, EF 65-70%, Gr 2 diast dysfn, trivial AI, MAC, trivial MR, trivial TR, PASP 36  . Thrombocytopenia   . Non-STEMI (non-ST elevated myocardial infarction)   . Hepatic steatosis   . Heart attack    Past Surgical History  Procedure Laterality Date  . Eye surgery     Family History  Problem Relation Age of Onset  . Heart attack Mother 6   History  Substance Use Topics  . Smoking status: Light Tobacco Smoker -- 0.10 packs/day    Types: Cigarettes  . Smokeless tobacco: Never Used     Comment: almost quit, very rare, 1-2 daily  . Alcohol Use: Yes     Comment: 2-3 beers on weekends, previous 6-8 beers daily    Review of Systems  Allergies  Review of patient's allergies indicates no known allergies.  Home Medications  No current outpatient prescriptions on file. BP 107/66  Pulse 68  Temp(Src) 98.4 F (36.9 C) (Oral)  Resp 18  Ht 5\' 11"  (1.803 m)  Wt 160 lb (72.576 kg)  BMI 22.33  kg/m2  SpO2 99% Physical Exam  ED Course  Procedures (including critical care time) Labs Review Labs Reviewed  CBC - Abnormal; Notable for the following:    MCH 35.7 (*)    MCHC 36.3 (*)    All other components within normal limits  COMPREHENSIVE METABOLIC PANEL - Abnormal; Notable for the following:    Glucose, Bld 105 (*)    AST 140 (*)    ALT 117 (*)    GFR calc non Af Amer 88 (*)    All other components within normal limits  ETHANOL - Abnormal; Notable for the following:    Alcohol, Ethyl (B) 242 (*)    All other components within normal limits  URINE RAPID DRUG SCREEN (HOSP PERFORMED)   Imaging Review No results found.  MDM   1. Alcohol intoxication    7:41 AM Patient is clinically sober at this time.  He reports no suicidal or homicidal thoughts.  He requests outpatient resources for alcohol abuse. To be discharged home at this time.  No indication for involuntary commitment.  Vital signs are normal.     Lyanne Co, MD 10/28/12 [phone]

## 2012-10-28 NOTE — ED Provider Notes (Signed)
CSN: [id number]     Arrival date & time 10/27/12  2355 History   First MD Initiated Contact with Patient 10/28/12 0030     Chief Complaint  Patient presents with  . Medical Clearance   (Consider location/radiation/quality/duration/timing/severity/associated sxs/prior Treatment) The history is provided by the patient.   patient reports he continues to drink alcohol and smoke crack cocaine due to chronic pain.  He states he uses to control his pain.  He is not overtly requesting help with detox.  Unclear exactly why the patient in the emergency department today.  He has no suicidal or homicidal thoughts.  He denies hallucinations.  It is obvious that he is intoxicated this time and he'll need to sober in the emergency department so that we can obtain more history.  Medical records demonstrate no history of hepatitis C, alcohol abuse by his primary care physician.  He does have a history of depression.  He has a prior history of MI.  No chest pain or shortness of breath at this time.  Past Medical History  Diagnosis Date  . Depression   . Hepatitis C     a. s/p [name] ~ 2003 in [place], [place] => stopped after 6 mos due to neutropenia - viral load noted to be low at that time;  no follow up since;   b. abdominal u/s 11/13:  diffuse hepatic steatosis and/or hepatocellular disease    . ETOH abuse   . Tobacco abuse   . CAD (coronary artery disease) 11/13    a. 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 Hx of echocardiogram     a. Echo 01/06/12: Mild focal basal hypertrophy the septum, vigorous LV, EF 65-70%, Gr 2 diast dysfn, trivial AI, MAC, trivial MR, trivial TR, PASP 36  . Thrombocytopenia   . Non-STEMI (non-ST elevated myocardial infarction)   . Hepatic steatosis   . Heart attack    Past  Surgical History  Procedure Laterality Date  . Eye surgery     Family History  Problem Relation Age of Onset  . Heart attack Mother 80   History  Substance Use Topics  . Smoking status: Light Tobacco Smoker -- 0.10 packs/day    Types: Cigarettes  . Smokeless tobacco: Never Used     Comment: almost quit, very rare, 1-2 daily  . Alcohol Use: Yes     Comment: 2-3 beers on weekends, previous 6-8 beers daily    Review of Systems  All other systems reviewed and are negative.    Allergies  Review of patient's allergies indicates no known allergies.  Home Medications   Current Outpatient Rx  Name  Route  Sig  Dispense  Refill  . aspirin EC 81 MG EC tablet   Oral   Take 1 tablet (81 mg total) by mouth daily.         . nitroGLYCERIN (NITROSTAT) 0.4 MG SL tablet   Sublingual   Place 1 tablet (0.4 mg total) under the tongue every 5 (five) minutes x 3 doses as needed for chest pain.   25 tablet   3   . pantoprazole (PROTONIX) 40 MG tablet   Oral   Take 1 tablet (40 mg total) by mouth daily.   30 tablet   11    BP  132/87  Pulse 83  Temp(Src) 98 F (36.7 C) (Oral)  Resp 18  Ht 5\' 11"  (1.803 m)  Wt 160 lb (72.576 kg)  BMI 22.33 kg/m2  SpO2 98% Physical Exam  Nursing note and vitals reviewed. Constitutional: He is oriented to person, place, and time. He appears well-developed and well-nourished.  HENT:  Head: Normocephalic and atraumatic.  Eyes: EOM are normal.  Neck: Normal range of motion.  Cardiovascular: Normal rate, regular rhythm, normal heart sounds and intact distal pulses.   Pulmonary/Chest: Effort normal and breath sounds normal. No respiratory distress.  Abdominal: Soft. He exhibits no distension. There is no tenderness.  Genitourinary: Rectum normal.  Musculoskeletal: Normal range of motion.  Neurological: He is alert and oriented to person, place, and time.  Skin: Skin is warm and dry.  Psychiatric: He has a normal mood and affect. Judgment normal.     ED Course  Procedures (including critical care time) Labs Review Labs Reviewed  CBC - Abnormal; Notable for the following:    MCH 35.7 (*)    MCHC 36.3 (*)    All other components within normal limits  COMPREHENSIVE METABOLIC PANEL - Abnormal; Notable for the following:    Glucose, Bld 105 (*)    AST 140 (*)    ALT 117 (*)    GFR calc non Af Amer 88 (*)    All other components within normal limits  ETHANOL - Abnormal; Notable for the following:    Alcohol, Ethyl (B) 242 (*)    All other components within normal limits  URINE RAPID DRUG SCREEN (HOSP PERFORMED)   Imaging Review No results found.  MDM   1. Alcohol intoxication    Patient will be a loud to sober in the emergency department.  8:12 AM Discharge home in good condition.  Patient is clinically sober at this time.  Patient states he would like outpatient resources for his alcohol abuse.  He is now on and patient.  Discharge home in good condition.    Lyanne Co, MD 10/28/12 [phone]

## 2012-10-28 NOTE — ED Notes (Signed)
Bed: WA09 Expected date:  Expected time:  Means of arrival:  Comments: Hold for triage 3 

## 2012-11-02 ENCOUNTER — Encounter: Payer: Self-pay | Admitting: Family Medicine

## 2012-11-02 ENCOUNTER — Ambulatory Visit (INDEPENDENT_AMBULATORY_CARE_PROVIDER_SITE_OTHER): Payer: BC Managed Care – PPO | Admitting: Family Medicine

## 2012-11-02 VITALS — BP 140/88 | HR 72 | Temp 98.6°F | Ht 70.0 in | Wt 158.6 lb

## 2012-11-02 DIAGNOSIS — F329 Major depressive disorder, single episode, unspecified: Secondary | ICD-10-CM

## 2012-11-02 DIAGNOSIS — F17200 Nicotine dependence, unspecified, uncomplicated: Secondary | ICD-10-CM

## 2012-11-02 DIAGNOSIS — Z720 Tobacco use: Secondary | ICD-10-CM

## 2012-11-02 DIAGNOSIS — F3289 Other specified depressive episodes: Secondary | ICD-10-CM

## 2012-11-02 DIAGNOSIS — F1010 Alcohol abuse, uncomplicated: Secondary | ICD-10-CM

## 2012-11-02 MED ORDER — CITALOPRAM HYDROBROMIDE 40 MG PO TABS: 40.0000 mg | ORAL_TABLET | Freq: Every day | ORAL | Status: DC

## 2012-11-02 MED ORDER — NICOTINE 7 MG/24HR TD PT24: 1.0000 | MEDICATED_PATCH | TRANSDERMAL | Status: DC

## 2012-11-02 NOTE — Assessment & Plan Note (Signed)
Pt is interested in smoking cessation.  Due to the fact he is down to 5 cigs/day, he needs to start at the lowest dose.  Script given for nicotine patches which is what pt is interested in so he can compare price to OTC.  Applauded his decision to quit.

## 2012-11-02 NOTE — Assessment & Plan Note (Signed)
Deteriorated.  Increase celexa to 40mg  daily as pt doesn't feel sxs are well controlled.  Pt also given names and #s of therapists to work on his depression which is feeding his ETOH abuse.  Will follow closely.

## 2012-11-02 NOTE — Patient Instructions (Addendum)
Follow up in 1 month to recheck mood Start the 40mg  Celexa daily- 2 of what you have at home and 1 of the new script Ask the pharmacy whether it's cheaper to buy the nicotine patch w/ prescription or OTC Call and schedule an appt w/ a therapist to discuss the depression and the drinking Continue going to your meetings You can do this!!

## 2012-11-02 NOTE — Progress Notes (Signed)
  Subjective:    Patient ID: Hunter Mcmillan, male    DOB: 1950/12/07, 62 y.o.   MRN: [id number]  HPI ETOH abuse- pt was in the ER 5 days ago, visibly intoxicated and admittedly smoking crack (although he denies this in office today).  Pt reports he is 'severely depressed'.  Currently taking Celexa 20mg  daily.  Has started going to the occasional AA meeting.  Last drank 5 days ago.  Pt reports 'old friends' are 'bothering him'.  Pt reports his typical drinking habits are more binge drinking than daily.  Tobacco use- pt would like to stop smoking, asking about nicotine patches.  Has 'cut down quite a bit'.  Currently smoking 5/day.   Review of Systems For ROS see HPI     Objective:   Physical Exam  Vitals reviewed. Constitutional: He is oriented to person, place, and time. He appears well-developed and well-nourished. No distress.  Neurological: He is alert and oriented to person, place, and time.  Skin: Skin is warm and dry.  Psychiatric: He has a normal mood and affect. His behavior is normal.  No evidence of intoxication or withdrawal          Assessment & Plan:

## 2012-12-02 ENCOUNTER — Encounter: Payer: Self-pay | Admitting: Family Medicine

## 2012-12-02 ENCOUNTER — Ambulatory Visit (INDEPENDENT_AMBULATORY_CARE_PROVIDER_SITE_OTHER): Payer: BC Managed Care – PPO | Admitting: Family Medicine

## 2012-12-02 VITALS — BP 120/68 | HR 77 | Temp 98.0°F | Resp 16 | Wt 155.4 lb

## 2012-12-02 DIAGNOSIS — F329 Major depressive disorder, single episode, unspecified: Secondary | ICD-10-CM

## 2012-12-02 DIAGNOSIS — R7989 Other specified abnormal findings of blood chemistry: Secondary | ICD-10-CM

## 2012-12-02 DIAGNOSIS — F3289 Other specified depressive episodes: Secondary | ICD-10-CM

## 2012-12-02 DIAGNOSIS — R6889 Other general symptoms and signs: Secondary | ICD-10-CM

## 2012-12-02 DIAGNOSIS — Z23 Encounter for immunization: Secondary | ICD-10-CM

## 2012-12-02 NOTE — Progress Notes (Signed)
  Subjective:    Patient ID: Hunter Mcmillan, male    DOB: Nov 07, 1950, 62 y.o.   MRN: [id number]  HPI Depression- pt reports this has improved since starting Celexa.  'sleeping better and this means I feel better during the the daytime'.  Taking 40mg  daily at lunch.  Alcohol- 'almost none'.  1 beer weekly.  Abnormal TSH- TSH was low in May but T3 and T4 were normal.   Review of Systems For ROS see HPI     Objective:   Physical Exam  Vitals reviewed. Constitutional: He appears well-developed and well-nourished. No distress.  Skin: Skin is warm and dry.  Psychiatric: He has a normal mood and affect. His behavior is normal. Thought content normal.  Mood much improved          Assessment & Plan:

## 2012-12-02 NOTE — Patient Instructions (Addendum)
Schedule your complete physical in May We'll notify you of your thyroid results Keep up the good work!  You look great! Call with any questions or concerns Happy Fall!!!

## 2012-12-02 NOTE — Assessment & Plan Note (Signed)
Improved since changing to Celexa 40mg .  Pt much happier.  Will continue to follow.

## 2012-12-02 NOTE — Assessment & Plan Note (Signed)
Noted on last labs.  Recheck today.  tx prn.

## 2012-12-03 LAB — T3, FREE: T3, Free: 2.7 pg/mL (ref 2.3–4.2)

## 2012-12-03 LAB — TSH: TSH: 0.87 u[IU]/mL (ref 0.35–5.50)

## 2012-12-03 LAB — T4, FREE: Free T4: 1.05 ng/dL (ref 0.60–1.60)

## 2013-01-22 ENCOUNTER — Other Ambulatory Visit (HOSPITAL_COMMUNITY): Payer: Self-pay | Admitting: Physician Assistant

## 2013-03-29 ENCOUNTER — Telehealth: Payer: Self-pay | Admitting: *Deleted

## 2013-03-29 NOTE — Telephone Encounter (Signed)
On Monday, March 28, 2013, Esmond Harps, M.D. [email or web address]> wrote: Tawni Carnes - Happy New Year. I saw this guy from [place] - 95 days sober from [facility] but cirrhosis on Fibroscan. Could you do an EGD on someone like this? I think his PCP is with [facility].  Thanks,  Greig Castilla

## 2013-03-31 NOTE — Telephone Encounter (Signed)
lmom for [facility] with Dr Corky Sing, [phone] to call back. Need records on pt.

## 2013-03-31 NOTE — Telephone Encounter (Signed)
Called Dr Radford Pax ofc and Lurena Joiner will fax ofc notes.

## 2013-03-31 NOTE — Telephone Encounter (Signed)
Message copied by Florene Glen on Thu Mar 31, 2013  8:20 AM ------      Message from: Beverley Fiedler      Created: Tue Mar 29, 2013  3:37 PM       Please call [facility] and request the office note.      He is also on Plavix and thus he needs to be seen, okay for extender, in the office before EGD      Thanks      JMP            ----- Message -----         From: Linna Hoff, RN         Sent: 03/29/2013   3:22 PM           To: Beverley Fiedler, MD            There are no notes in Care Everywhere; did you get any EMAILS with ofc notes?       ------

## 2013-04-14 NOTE — Telephone Encounter (Signed)
lmom for Lurena Joiner to call back.

## 2013-04-15 NOTE — Telephone Encounter (Signed)
Faxed a note requesting notes on pt again. Stacy, ave them coming to you. Thanks.

## 2013-05-11 ENCOUNTER — Encounter: Payer: Self-pay | Admitting: Internal Medicine

## 2013-05-27 ENCOUNTER — Encounter: Payer: Self-pay | Admitting: Gastroenterology

## 2013-05-27 ENCOUNTER — Ambulatory Visit (INDEPENDENT_AMBULATORY_CARE_PROVIDER_SITE_OTHER): Payer: BC Managed Care – PPO | Admitting: Gastroenterology

## 2013-05-27 VITALS — BP 110/70 | HR 76 | Ht 70.0 in | Wt 159.0 lb

## 2013-05-27 DIAGNOSIS — B1920 Unspecified viral hepatitis C without hepatic coma: Secondary | ICD-10-CM

## 2013-05-27 DIAGNOSIS — K7460 Unspecified cirrhosis of liver: Secondary | ICD-10-CM

## 2013-05-27 NOTE — Patient Instructions (Addendum)
You have been scheduled for an endoscopy at [facility] with Dr. Juanda Chance. Please follow instructions given today.  You have been scheduled for an abdominal ultrasound at [facility] (1st floor of hospital) on 05-30-2013 at 730 am. Please arrive 15 minutes prior to your appointment for registration. Make certain not to have anything to eat or drink 6 hours prior to your appointment. Should you need to reschedule your appointment, please contact radiology at [phone]. This test typically takes about 30 minutes to perform.  Please call the office once you find your records from your previous Gastroenterologist or if you can remember the doctor.  We will contact [facility] to try to obtain your pneumonia and flu vaccination record.

## 2013-05-28 ENCOUNTER — Encounter: Payer: Self-pay | Admitting: Gastroenterology

## 2013-05-28 DIAGNOSIS — K7460 Unspecified cirrhosis of liver: Secondary | ICD-10-CM | POA: Insufficient documentation

## 2013-05-28 NOTE — Progress Notes (Signed)
05/28/2013 Hunter Mcmillan [id number] 04-10-1950   History of Present Illness:  Patient is a 63 year old male who was previously seen by Dr. Juanda Chance for evaluation regarding abnormal liver function tests.  He does have Hepatitis C and cirrhosis secondary to that and ETOH.  He is now 5 months sober after being admitted to [facility] and attending AA meetings.  He's had Hepatitis C for quite some time and was diagnosed in 1995.  Underwent treatment in [place], [place] at some point, but apparently treatment had to be discontinued due to neutropenia.  He recently saw Dr. Corky Sing at [facility] to discuss treatment for his Hepatitis C.  Dr. Corky Sing has referred him back here for him to undergo EGD for variceal bleeding.  Patient is unsure how long he will follow with Dr, Corky Sing and would like to try to receive as much of his care locally here in [place] if possible.  He does have immunity to Hep A and Hep B according to Dr. Radford Pax notes and recent labs.  Patient thinks that he was vaccinated with pneumococcal vaccine while he was at [facility] so we will try to obtain that documentation.  Recent labs show a mild thrombocytopenia of 107, AST 99, ALT 79, ALP 118, and total bili 1.0.  INR 1.1.  He had labs performed by Dr. Corky Sing for his Hepatitis C genotype.  He states that he had a colonoscopy in 2013 in [place], but he cannot the name of the physician, etc.  Says that he will look for records at home.    Patient denies any complaints except for some intermittent dysphagia to solids.  Never has dysphagia to liquids.  According to Dr. Regino Schultze note in 2013 he was reporting some issue with swallowing at that point as well.    Current Medications, Allergies, Past Medical History, Past Surgical History, Family History and Social History were reviewed in [employer] record.   Physical Exam: BP 110/70  Pulse 76  Ht 5\' 10"  (1.778 m)  Wt 159 lb (72.122 kg)  BMI 22.81 kg/m2 General: Well  developed male in no acute distress; appears older than stated age Head: Normocephalic and atraumatic Eyes:  Sclerae anicteric, conjunctiva pink  Ears: Normal auditory acuity Lungs: Clear throughout to auscultation Heart: Regular rate and rhythm Abdomen: Soft, non-distended.  Normal bowel sounds.  Non-tender. Musculoskeletal: Symmetrical with no gross deformities  Extremities: No edema  Neurological: Alert oriented x 4, grossly non-focal Psychological:  Alert and cooperative. Normal mood and affect  Assessment and Recommendations: -Cirrhosis secondary to ETOH abuse and Hepatitis C:  Was recently seen by Dr. Corky Sing at [facility] and he requested that patient have EGD for variceal screening.  Will schedule with Dr. Juanda Chance at [facility].  The risks, benefits, and alternatives were discussed with the patient and he consents to proceed.  We will contact [facility] to obtain documentation of his pneumococcal vaccination.  Will schedule for ultrasound for HCC screening.   -Hepatitis C:  Patient will need eventual treatment for this.  Can arrange treatment locally if he does not undergo treatment with Dr. Corky Sing. -ETOH abuse:  5 months sober after being admitted to [facility].  *Patient reports that he had colonoscopy in 2013 in [place].  He will look for records at home so we can at least have the name of the physician to try and obtain his records.   **After the patient left we were able to contact [facility].  They said that  the patient declined the pneumococcal vaccination while he was there.  Patient will need to receive this at his next visit.

## 2013-05-29 NOTE — Progress Notes (Signed)
Reviewed and agree.

## 2013-05-30 ENCOUNTER — Telehealth: Payer: Self-pay | Admitting: Gastroenterology

## 2013-05-30 ENCOUNTER — Ambulatory Visit (HOSPITAL_COMMUNITY)
Admission: RE | Admit: 2013-05-30 | Discharge: 2013-05-30 | Disposition: A | Discharge status: Home / Self Care | Payer: BC Managed Care – PPO | Source: Ambulatory Visit | Attending: Gastroenterology | Admitting: Gastroenterology

## 2013-05-30 DIAGNOSIS — B182 Chronic viral hepatitis C: Secondary | ICD-10-CM | POA: Insufficient documentation

## 2013-05-30 DIAGNOSIS — K7460 Unspecified cirrhosis of liver: Secondary | ICD-10-CM | POA: Insufficient documentation

## 2013-05-31 NOTE — Telephone Encounter (Signed)
I called the patient and asked him to call me back.  I left a question on the message asking him if he could come to our office and ask for me. I need him to sign a release of information so I can fax it to Dr. Janalyn Shy office in [place], [facility].

## 2013-06-01 ENCOUNTER — Encounter: Payer: Self-pay | Admitting: Internal Medicine

## 2013-07-01 ENCOUNTER — Telehealth: Payer: Self-pay | Admitting: *Deleted

## 2013-07-21 ENCOUNTER — Ambulatory Visit: Payer: BC Managed Care – PPO

## 2013-07-21 ENCOUNTER — Ambulatory Visit (INDEPENDENT_AMBULATORY_CARE_PROVIDER_SITE_OTHER): Payer: BC Managed Care – PPO | Admitting: Family Medicine

## 2013-07-21 ENCOUNTER — Encounter: Payer: Self-pay | Admitting: Family Medicine

## 2013-07-21 ENCOUNTER — Other Ambulatory Visit: Payer: Self-pay | Admitting: General Practice

## 2013-07-21 VITALS — BP 120/78 | HR 70 | Temp 98.0°F | Resp 16 | Ht 70.0 in | Wt 144.1 lb

## 2013-07-21 DIAGNOSIS — B1920 Unspecified viral hepatitis C without hepatic coma: Secondary | ICD-10-CM

## 2013-07-21 DIAGNOSIS — R946 Abnormal results of thyroid function studies: Secondary | ICD-10-CM

## 2013-07-21 DIAGNOSIS — N508 Other specified disorders of male genital organs: Secondary | ICD-10-CM

## 2013-07-21 DIAGNOSIS — N5089 Other specified disorders of the male genital organs: Secondary | ICD-10-CM | POA: Insufficient documentation

## 2013-07-21 DIAGNOSIS — Z0000 Encounter for general adult medical examination without abnormal findings: Secondary | ICD-10-CM

## 2013-07-21 LAB — LIPID PANEL
Cholesterol: 129 mg/dL (ref 0–200)
HDL: 44.2 mg/dL (ref >39.00)
LDL Cholesterol: 72 mg/dL (ref 0–99)
Total CHOL/HDL Ratio: 3
Triglycerides: 63 mg/dL (ref 0.0–149.0)
VLDL: 12.6 mg/dL (ref 0.0–40.0)

## 2013-07-21 LAB — BASIC METABOLIC PANEL
BUN: 16 mg/dL (ref 6–23)
CO2: 30 mEq/L (ref 19–32)
Calcium: 9.1 mg/dL (ref 8.4–10.5)
Chloride: 102 mEq/L (ref 96–112)
Creatinine, Ser: 1 mg/dL (ref 0.4–1.5)
GFR: 76.37 mL/min (ref >60.00)
Glucose, Bld: 86 mg/dL (ref 70–99)
Potassium: 3.9 mEq/L (ref 3.5–5.1)
Sodium: 138 mEq/L (ref 135–145)

## 2013-07-21 LAB — TSH: TSH: 0.26 u[IU]/mL — ABNORMAL LOW (ref 0.35–4.50)

## 2013-07-21 LAB — CBC WITH DIFFERENTIAL/PLATELET
Basophils Absolute: 0 10*3/uL (ref 0.0–0.1)
Basophils Relative: 0.7 % (ref 0.0–3.0)
Eosinophils Absolute: 0.1 10*3/uL (ref 0.0–0.7)
Eosinophils Relative: 1.2 % (ref 0.0–5.0)
HCT: 43.3 % (ref 39.0–52.0)
Hemoglobin: 14.8 g/dL (ref 13.0–17.0)
Lymphocytes Relative: 29.7 % (ref 12.0–46.0)
Lymphs Abs: 1.8 10*3/uL (ref 0.7–4.0)
MCHC: 34.2 g/dL (ref 30.0–36.0)
MCV: 102.7 fl — ABNORMAL HIGH (ref 78.0–100.0)
Monocytes Absolute: 0.4 10*3/uL (ref 0.1–1.0)
Monocytes Relative: 6.2 % (ref 3.0–12.0)
Neutro Abs: 3.8 10*3/uL (ref 1.4–7.7)
Neutrophils Relative %: 62.2 % (ref 43.0–77.0)
Platelets: 97 10*3/uL — ABNORMAL LOW (ref 150.0–400.0)
RBC: 4.21 Mil/uL — ABNORMAL LOW (ref 4.22–5.81)
RDW: 15 % (ref 11.5–15.5)
WBC: 6.1 10*3/uL (ref 4.0–10.5)

## 2013-07-21 LAB — HEPATIC FUNCTION PANEL
ALT: 87 U/L — ABNORMAL HIGH (ref 0–53)
AST: 84 U/L — ABNORMAL HIGH (ref 0–37)
Albumin: 3.2 g/dL — ABNORMAL LOW (ref 3.5–5.2)
Alkaline Phosphatase: 114 U/L (ref 39–117)
Bilirubin, Direct: 0.4 mg/dL — ABNORMAL HIGH (ref 0.0–0.3)
Total Bilirubin: 1 mg/dL (ref 0.2–1.2)
Total Protein: 6.6 g/dL (ref 6.0–8.3)

## 2013-07-21 LAB — PSA: PSA: 0.9 ng/mL (ref 0.10–4.00)

## 2013-07-21 MED ORDER — CITALOPRAM HYDROBROMIDE 40 MG PO TABS: 40.0000 mg | ORAL_TABLET | Freq: Every day | ORAL | Status: DC

## 2013-07-21 NOTE — Progress Notes (Signed)
   Subjective:    Patient ID: Hunter Mcmillan, male    DOB: June 05, 1950, 63 y.o.   MRN: [id number]  HPI CPE- UTD on colonoscopy.  Has endoscopy upcoming w/ Dr Juanda Chance.  Has not responded to Hep C referral x2.  Was seeing Dr Corky Sing previously ([facility])- reports he will be seeing someone new in the [facility] system for his 'internal medicine needs' and will only be seeing me for physicals.  Pt reports no concerns today.   Review of Systems Patient reports no vision/hearing changes, anorexia, fever ,adenopathy, persistant/recurrent hoarseness, swallowing issues, chest pain, palpitations, edema, persistant/recurrent cough, hemoptysis, dyspnea (rest,exertional, paroxysmal nocturnal), gastrointestinal  bleeding (melena, rectal bleeding), abdominal pain, excessive heart burn, GU symptoms (dysuria, hematuria, voiding/incontinence issues) syncope, focal weakness, memory loss, numbness & tingling, skin/hair/nail changes, depression, anxiety, abnormal bruising/bleeding, musculoskeletal symptoms/signs.      Objective:   Physical Exam BP 120/78  Pulse 70  Temp(Src) 98 F (36.7 C) (Oral)  Resp 16  Ht 5\' 10"  (1.778 m)  Wt 144 lb 2 oz (65.375 kg)  BMI 20.68 kg/m2  SpO2 97%  General Appearance:    Alert, cooperative, no distress, appears stated age  Head:    Normocephalic, without obvious abnormality, atraumatic  Eyes:    PERRL, conjunctiva/corneas clear, EOM's intact, fundi    benign, both eyes       Ears:    Normal TM's and external ear canals, both ears  Nose:   Nares normal, septum midline, mucosa normal, no drainage   or sinus tenderness  Throat:   Lips, mucosa, and tongue normal; gums normal; poor dentition  Neck:   Supple, symmetrical, trachea midline, no adenopathy;       thyroid:  No enlargement/tenderness/nodules  Back:     Symmetric, no curvature, ROM normal, no CVA tenderness  Lungs:     Clear to auscultation bilaterally, respirations unlabored  Chest wall:    No tenderness or deformity    Heart:    Regular rate and rhythm, S1 and S2 normal, no murmur, rub   or gallop  Abdomen:     Soft, non-tender, bowel sounds active all four quadrants,    no masses, no organomegaly  Genitalia:    Normal male without lesion, discharge or tenderness, R scrotal mass superior and posterior to R testicle  Rectal:    Normal tone, normal prostate, no masses or tenderness  Extremities:   Extremities normal, atraumatic, no cyanosis or edema  Pulses:   2+ and symmetric all extremities  Skin:   Skin color, texture, turgor normal, no rashes or lesions  Lymph nodes:   Cervical, supraclavicular, and axillary nodes normal  Neurologic:   CNII-XII intact. Normal strength, sensation and reflexes      throughout          Assessment & Plan:

## 2013-07-21 NOTE — Progress Notes (Signed)
Pre visit review using our clinic review tool, if applicable. No additional management support is needed unless otherwise documented below in the visit note. 

## 2013-07-21 NOTE — Assessment & Plan Note (Signed)
New.  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  Will get [place] to assess and if needed, refer to urology.

## 2013-07-21 NOTE — Assessment & Plan Note (Signed)
Pt was following w/ Dr Corky Sing at [facility].  Pt has refused to local referrals to Hep C clininc w/ [place] [facility].  Pt reports he will be seeing Dr Juanda Chance and 'another [facility] doctor in that building for my internal medicine needs'.  Unclear as to who that is or what that means for me as his PCP- he reports he will still come for CPEs- but is unable to give additional information and there is no appt scheduled in the [facility] system.

## 2013-07-21 NOTE — Patient Instructions (Signed)
Follow up in 1 year or as needed Please make sure that you have treatment for your hepatitis (either Dr Corky Sing or here in [place]) [facility] notify you of your lab results and make any changes if needed Call with any questions or concerns Happy Memorial Day!

## 2013-07-21 NOTE — Assessment & Plan Note (Signed)
Pt's PE WNL w/ exception of poor dentition.  Check labs.  Again encouraged him to seek tx for Hep C.  Will follow.

## 2013-07-22 ENCOUNTER — Ambulatory Visit (HOSPITAL_BASED_OUTPATIENT_CLINIC_OR_DEPARTMENT_OTHER)
Admission: RE | Admit: 2013-07-22 | Discharge: 2013-07-22 | Disposition: A | Discharge status: Home / Self Care | Payer: BC Managed Care – PPO | Source: Ambulatory Visit | Attending: Family Medicine | Admitting: Family Medicine

## 2013-07-22 ENCOUNTER — Telehealth: Payer: Self-pay | Admitting: Family Medicine

## 2013-07-22 DIAGNOSIS — N508 Other specified disorders of male genital organs: Secondary | ICD-10-CM | POA: Insufficient documentation

## 2013-07-22 DIAGNOSIS — N5089 Other specified disorders of the male genital organs: Secondary | ICD-10-CM

## 2013-07-22 LAB — T4, FREE: Free T4: 0.93 ng/dL (ref 0.60–1.60)

## 2013-07-22 LAB — T3, FREE: T3, Free: 3 pg/mL (ref 2.3–4.2)

## 2013-07-22 NOTE — Telephone Encounter (Signed)
Relevant patient education mailed to patient.  

## 2013-07-26 ENCOUNTER — Telehealth: Payer: Self-pay | Admitting: Internal Medicine

## 2013-07-26 NOTE — Telephone Encounter (Signed)
Please notify Dr Corky Sing, [facility], [place] of pt canceling the EGD which was scheduled for August 03, 2013 for monitoring of esophageal varices. Pt did not reschedule, cites financial issues.

## 2013-07-26 NOTE — Telephone Encounter (Signed)
Spoke with patient and he states he has "run into financial problems" and cannot afford the procedure on 08/03/13. Discussed with patient that [facility] has payment plans but he declines this. Cancelled appointment with Noreene Larsson at [facility] endo.

## 2013-07-27 NOTE — Telephone Encounter (Signed)
Left a message for Leatha Gilding with Dr. Corky Sing at [phone] to call me back.

## 2013-07-27 NOTE — Telephone Encounter (Signed)
Spoke with Leatha Gilding and she will let Dr. Corky Sing know that patient cancelled EGD and would not reschedule due to financial concerns.

## 2013-08-02 ENCOUNTER — Telehealth: Payer: Self-pay | Admitting: *Deleted

## 2013-08-02 NOTE — Telephone Encounter (Signed)
Patient dropped off paperwork for [facility]. Form forwarded to Dr. Beverely Low. JG//CMA

## 2013-08-03 ENCOUNTER — Ambulatory Visit (HOSPITAL_COMMUNITY)
Admission: RE | Admit: 2013-08-03 | Payer: BC Managed Care – PPO | Source: Ambulatory Visit | Admitting: Internal Medicine

## 2013-08-03 ENCOUNTER — Encounter (HOSPITAL_COMMUNITY): Admission: RE | Payer: Self-pay | Source: Ambulatory Visit

## 2013-08-03 SURGERY — EGD (ESOPHAGOGASTRODUODENOSCOPY)
Anesthesia: Moderate Sedation

## 2013-08-08 NOTE — Telephone Encounter (Signed)
Form mailed to [facility] on August 05, 2013. Patient aware. JG//CMA

## 2013-11-17 ENCOUNTER — Encounter (HOSPITAL_COMMUNITY): Payer: Self-pay | Admitting: Emergency Medicine

## 2013-11-17 ENCOUNTER — Emergency Department (HOSPITAL_COMMUNITY)
Admission: EM | Admit: 2013-11-17 | Discharge: 2013-11-18 | Disposition: A | Discharge status: Home / Self Care | Payer: BC Managed Care – PPO | Attending: Emergency Medicine | Admitting: Emergency Medicine

## 2013-11-17 DIAGNOSIS — F329 Major depressive disorder, single episode, unspecified: Secondary | ICD-10-CM | POA: Diagnosis not present

## 2013-11-17 DIAGNOSIS — D696 Thrombocytopenia, unspecified: Secondary | ICD-10-CM | POA: Insufficient documentation

## 2013-11-17 DIAGNOSIS — Z8619 Personal history of other infectious and parasitic diseases: Secondary | ICD-10-CM | POA: Insufficient documentation

## 2013-11-17 DIAGNOSIS — F1010 Alcohol abuse, uncomplicated: Secondary | ICD-10-CM | POA: Diagnosis not present

## 2013-11-17 DIAGNOSIS — Z7982 Long term (current) use of aspirin: Secondary | ICD-10-CM | POA: Diagnosis not present

## 2013-11-17 DIAGNOSIS — I252 Old myocardial infarction: Secondary | ICD-10-CM | POA: Diagnosis not present

## 2013-11-17 DIAGNOSIS — R079 Chest pain, unspecified: Secondary | ICD-10-CM | POA: Diagnosis not present

## 2013-11-17 DIAGNOSIS — F3289 Other specified depressive episodes: Secondary | ICD-10-CM | POA: Insufficient documentation

## 2013-11-17 DIAGNOSIS — Z79899 Other long term (current) drug therapy: Secondary | ICD-10-CM | POA: Insufficient documentation

## 2013-11-17 DIAGNOSIS — F1910 Other psychoactive substance abuse, uncomplicated: Secondary | ICD-10-CM

## 2013-11-17 DIAGNOSIS — F17200 Nicotine dependence, unspecified, uncomplicated: Secondary | ICD-10-CM | POA: Diagnosis not present

## 2013-11-17 DIAGNOSIS — I2510 Atherosclerotic heart disease of native coronary artery without angina pectoris: Secondary | ICD-10-CM | POA: Diagnosis not present

## 2013-11-17 DIAGNOSIS — R002 Palpitations: Secondary | ICD-10-CM | POA: Insufficient documentation

## 2013-11-17 DIAGNOSIS — R638 Other symptoms and signs concerning food and fluid intake: Secondary | ICD-10-CM | POA: Diagnosis not present

## 2013-11-17 DIAGNOSIS — F1410 Cocaine abuse, uncomplicated: Secondary | ICD-10-CM | POA: Insufficient documentation

## 2013-11-17 LAB — COMPREHENSIVE METABOLIC PANEL
ALT: 95 U/L — ABNORMAL HIGH (ref 0–53)
AST: 116 U/L — ABNORMAL HIGH (ref 0–37)
Albumin: 3.4 g/dL — ABNORMAL LOW (ref 3.5–5.2)
Alkaline Phosphatase: 105 U/L (ref 39–117)
Anion gap: 10 (ref 5–15)
BUN: 12 mg/dL (ref 6–23)
CO2: 27 mEq/L (ref 19–32)
Calcium: 9.5 mg/dL (ref 8.4–10.5)
Chloride: 102 mEq/L (ref 96–112)
Creatinine, Ser: 0.75 mg/dL (ref 0.50–1.35)
GFR calc Af Amer: >90 mL/min (ref >90)
GFR calc non Af Amer: >90 mL/min (ref >90)
Glucose, Bld: 91 mg/dL (ref 70–99)
Potassium: 4.5 mEq/L (ref 3.7–5.3)
Sodium: 139 mEq/L (ref 137–147)
Total Bilirubin: 1.3 mg/dL — ABNORMAL HIGH (ref 0.3–1.2)
Total Protein: 7.5 g/dL (ref 6.0–8.3)

## 2013-11-17 LAB — ETHANOL: Alcohol, Ethyl (B): <11 mg/dL (ref 0–11)

## 2013-11-17 LAB — CBC
HCT: 39.9 % (ref 39.0–52.0)
Hemoglobin: 14.2 g/dL (ref 13.0–17.0)
MCH: 34.7 pg — ABNORMAL HIGH (ref 26.0–34.0)
MCHC: 35.6 g/dL (ref 30.0–36.0)
MCV: 97.6 fL (ref 78.0–100.0)
Platelets: 104 10*3/uL — ABNORMAL LOW (ref 150–400)
RBC: 4.09 MIL/uL — ABNORMAL LOW (ref 4.22–5.81)
RDW: 15 % (ref 11.5–15.5)
WBC: 6.3 10*3/uL (ref 4.0–10.5)

## 2013-11-17 LAB — RAPID URINE DRUG SCREEN, HOSP PERFORMED
Amphetamines: NOT DETECTED
Barbiturates: NOT DETECTED
Benzodiazepines: NOT DETECTED
Cocaine: POSITIVE — AB
Opiates: NOT DETECTED
Tetrahydrocannabinol: NOT DETECTED

## 2013-11-17 LAB — I-STAT TROPONIN, ED: Troponin i, poc: 0 ng/mL (ref 0.00–0.08)

## 2013-11-17 MED ORDER — IBUPROFEN 200 MG PO TABS: 600.0000 mg | ORAL_TABLET | Freq: Three times a day (TID) | ORAL | Status: DC | PRN

## 2013-11-17 MED ORDER — ACETAMINOPHEN 325 MG PO TABS: 650.0000 mg | ORAL_TABLET | ORAL | Status: DC | PRN

## 2013-11-17 MED ORDER — LORAZEPAM 1 MG PO TABS
0.0000 mg | ORAL_TABLET | Freq: Four times a day (QID) | ORAL | Status: DC
  Administered 2013-11-18: 2 mg via ORAL
  Filled 2013-11-17: qty 2

## 2013-11-17 MED ORDER — LORAZEPAM 1 MG PO TABS: 0.0000 mg | ORAL_TABLET | Freq: Two times a day (BID) | ORAL | Status: DC

## 2013-11-17 MED ORDER — VITAMIN B-1 100 MG PO TABS
100.0000 mg | ORAL_TABLET | Freq: Every day | ORAL | Status: DC
  Administered 2013-11-18: 100 mg via ORAL
  Filled 2013-11-17: qty 1

## 2013-11-17 MED ORDER — ONDANSETRON HCL 4 MG PO TABS: 4.0000 mg | ORAL_TABLET | Freq: Three times a day (TID) | ORAL | Status: DC | PRN

## 2013-11-17 MED ORDER — THIAMINE HCL 100 MG/ML IJ SOLN: 100.0000 mg | Freq: Every day | INTRAMUSCULAR | Status: DC

## 2013-11-17 MED ORDER — LORAZEPAM 1 MG PO TABS
1.0000 mg | ORAL_TABLET | Freq: Three times a day (TID) | ORAL | Status: DC | PRN
Start: 2013-11-17 — End: 2013-11-18

## 2013-11-17 MED ORDER — ALUM & MAG HYDROXIDE-SIMETH 200-200-20 MG/5ML PO SUSP: 30.0000 mL | ORAL | Status: DC | PRN

## 2013-11-17 NOTE — ED Notes (Signed)
Pt presents with c/o cocaine binge for the last several days as well as some alcohol. Pt reports that his last use of both was around 3pm today. Pt denies any SI/HI. Pt reports that he is feeling some chest tightness and feels like his heart is racing, HR stable, EKG performed in triage. Pt reports he is here for detox.

## 2013-11-17 NOTE — ED Provider Notes (Signed)
CSN: [id number]     Arrival date & time 11/17/13  1704 History   First MD Initiated Contact with Patient 11/17/13 1930     Chief Complaint  Patient presents with  . Detox      (Consider location/radiation/quality/duration/timing/severity/associated sxs/prior Treatment) The history is provided by the patient.   patient presents requesting help with his cocaine abuse. States that he has been on a binge over the last few days. States he thinks he's used around 6 g cocaine. States that he thinks of it may have been cut with Meth. States he felt his heart racing very has some dull chest pain. States he also drinks, but not older. States he has had depression. No suicidal thoughts but he states his pressures been getting worse. He states he had been clean for several months but then relapsed a few months ago. States he does not have a good place to stay right now. States he is hoping to spend the night here. States he may go to another psych hospital later to try and help with his diseases. No fevers. Occasional cough. No chest pain or palpitations at this time.  Past Medical History  Diagnosis Date  . Depression   . Hepatitis C     a. s/p [name] ~ 2003 in [place], [place] => stopped after 6 mos due to neutropenia - viral load noted to be low at that time;  no follow up since;   b. abdominal u/s 11/13:  diffuse hepatic steatosis and/or hepatocellular disease    . ETOH abuse   . Tobacco abuse   . CAD (coronary artery disease) 11/13    a. 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 Hx of echocardiogram     a. Echo 01/06/12: Mild focal basal hypertrophy the septum, vigorous LV, EF 65-70%, Gr 2 diast dysfn, trivial AI, MAC, trivial MR, trivial TR, PASP 36  . Thrombocytopenia   . Non-STEMI (non-ST  elevated myocardial infarction)   . Hepatic steatosis   . Heart attack    Past Surgical History  Procedure Laterality Date  . Eye surgery     Family History  Problem Relation Age of Onset  . Heart attack Mother 63  . Diabetes Mother   . Diabetes Maternal Grandfather   . Heart disease Maternal Grandfather    History  Substance Use Topics  . Smoking status: Light Tobacco Smoker -- 0.10 packs/day    Types: Cigarettes  . Smokeless tobacco: Never Used     Comment: almost quit, very rare, 1-2 daily  . Alcohol Use: No     Comment: 2-3 beers on weekends, previous 6-8 beers daily. Patient stopped drinking 5 months prior 05/27/13    Review of Systems  Constitutional: Positive for appetite change. Negative for activity change.  Eyes: Negative for pain.  Respiratory: Negative for chest tightness and shortness of breath.   Cardiovascular: Positive for chest pain and palpitations. Negative for leg swelling.  Gastrointestinal: Negative for nausea, vomiting, abdominal pain and diarrhea.  Genitourinary: Negative for flank pain.  Musculoskeletal: Negative for back pain and neck stiffness.  Skin: Negative for rash.  Neurological: Negative for weakness, numbness and headaches.  Psychiatric/Behavioral: Positive for dysphoric mood. Negative for suicidal ideas and behavioral problems.      Allergies  Review of  patient's allergies indicates no known allergies.  Home Medications   Prior to Admission medications   Medication Sig Start Date End Date Taking? Authorizing Provider  aspirin EC 81 MG tablet Take 81 mg by mouth daily.    Historical Provider, MD  citalopram (CELEXA) 40 MG tablet Take 1 tablet (40 mg total) by mouth daily. 07/21/13   Sheliah Hatch, MD  FOLIC ACID PO Take 40 mg by mouth daily.    Historical Provider, MD  Multiple Vitamin (MULTIVITAMIN) tablet Take 1 tablet by mouth daily.    Historical Provider, MD  nitroGLYCERIN (NITROSTAT) 0.4 MG SL tablet Place 0.4 mg under the  tongue every 5 (five) minutes as needed for chest pain.    Historical Provider, MD   BP 144/70  Pulse 69  Temp(Src) 98.5 F (36.9 C) (Oral)  Resp 20  SpO2 98% Physical Exam  Nursing note and vitals reviewed. Constitutional: He is oriented to person, place, and time. He appears well-developed and well-nourished.  HENT:  Head: Normocephalic and atraumatic.  Eyes: EOM are normal. Pupils are equal, round, and reactive to light.  Neck: Normal range of motion. Neck supple.  Cardiovascular: Normal rate, regular rhythm and normal heart sounds.   No murmur heard. Pulmonary/Chest: Effort normal.   Mildly harsh breath sounds. No wheezes.  Abdominal: Soft. Bowel sounds are normal. He exhibits no distension and no mass. There is no tenderness. There is no rebound and no guarding.  Musculoskeletal: Normal range of motion. He exhibits no edema.  Neurological: He is alert and oriented to person, place, and time. No cranial nerve deficit.  Skin: Skin is warm and dry.  Psychiatric: He has a normal mood and affect.    ED Course  Procedures (including critical care time) Labs Review Labs Reviewed  CBC  COMPREHENSIVE METABOLIC PANEL  ETHANOL  URINE RAPID DRUG SCREEN (HOSP PERFORMED)  I-STAT TROPOININ, ED    Imaging Review No results found.   EKG Interpretation   Date/Time:  Thursday November 17 2013 17:19:06 EDT Ventricular Rate:  80 PR Interval:  128 QRS Duration: 92 QT Interval:  398 QTC Calculation: 459 R Axis:   72 Text Interpretation:  Normal sinus rhythm Normal ECG No significant change  since last tracing Confirmed by [facility]  MD, Jonny Ruiz ([id number]) on 11/17/2013  5:29:28 PM Also confirmed by Rubin Payor  MD, Harrold Donath [phone])  on 11/17/2013  7:31:22 PM      MDM   Final diagnoses:  None    Patient requesting help substance abuse. Using cocaine primarily but also some alcohol. Also has some depression 2. Also has some social issues appears to medically cleared. To be seen by  TTS.    Juliet Rude. Rubin Payor, MD 11/17/13 [phone]

## 2013-11-18 ENCOUNTER — Encounter (HOSPITAL_COMMUNITY): Payer: Self-pay | Admitting: Psychiatry

## 2013-11-18 DIAGNOSIS — F1410 Cocaine abuse, uncomplicated: Secondary | ICD-10-CM | POA: Diagnosis present

## 2013-11-18 DIAGNOSIS — F1910 Other psychoactive substance abuse, uncomplicated: Secondary | ICD-10-CM

## 2013-11-18 NOTE — ED Notes (Signed)
Patient arrived to unit; no s/s of distress noted. Pt in shower after arrival.

## 2013-11-18 NOTE — [facility] Suicide Risk Assessment (Signed)
Suicide Risk Assessment  Discharge Assessment     Demographic Factors:  Male and Caucasian  Total Time spent with patient: 20 minutes  Psychiatric Specialty Exam:     Blood pressure 130/71, pulse 84, temperature 98.1 F (36.7 C), temperature source Oral, resp. rate 16, SpO2 98.00%.There is no weight on file to calculate BMI.  General Appearance: Casual  Eye Contact::  Good  Speech:  Normal Rate  Volume:  Normal  Mood:  Euthymic  Affect:  Congruent  Thought Process:  Coherent  Orientation:  Full (Time, Place, and Person)  Thought Content:  WDL  Suicidal Thoughts:  No  Homicidal Thoughts:  No  Memory:  Immediate;   Good Recent;   Good Remote;   Good  Judgement:  Fair  Insight:  Fair  Psychomotor Activity:  Normal  Concentration:  Fair  Recall:  Good  Fund of Knowledge:Good  Language: Good  Akathisia:  No  Handed:  Right  AIMS (if indicated):     Assets:  Health and safety inspector Housing Leisure Time Resilience Social Support  Sleep:      Musculoskeletal: Strength & Muscle Tone: within normal limits Gait & Station: normal Patient leans: N/A  Mental Status Per Nursing Assessment::   On Admission:   cocaine abuse  Current Mental Status by Physician: NA  Loss Factors: NA  Historical Factors: NA  Risk Reduction Factors:   Sense of responsibility to family, Living with another person, especially a relative and Positive social support  Continued Clinical Symptoms:  None  Cognitive Features That Contribute To Risk:  None  Suicide Risk:  Minimal: No identifiable suicidal ideation.  Patients presenting with no risk factors but with morbid ruminations; may be classified as minimal risk based on the severity of the depressive symptoms  Discharge Diagnoses:   AXIS I:  Substance Abuse AXIS II:  Deferred AXIS III:   Past Medical History  Diagnosis Date  . Depression   . Hepatitis C     a. s/p [name] ~ 2003 in [place], [place] =>  stopped after 6 mos due to neutropenia - viral load noted to be low at that time;  no follow up since;   b. abdominal u/s 11/13:  diffuse hepatic steatosis and/or hepatocellular disease    . ETOH abuse   . Tobacco abuse   . CAD (coronary artery disease) 11/13    a. 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 Hx of echocardiogram     a. Echo 01/06/12: Mild focal basal hypertrophy the septum, vigorous LV, EF 65-70%, Gr 2 diast dysfn, trivial AI, MAC, trivial MR, trivial TR, PASP 36  . Thrombocytopenia   . Non-STEMI (non-ST elevated myocardial infarction)   . Hepatic steatosis   . Heart attack    AXIS IV:  other psychosocial or environmental problems and problems related to social environment AXIS V:  61-70 mild symptoms  Plan Of Care/Follow-up recommendations:  Activity:  as tolerated Diet:  low-sodium heart healthy diet  Is patient on multiple antipsychotic therapies at discharge:  No   Has Patient had three or more failed trials of antipsychotic monotherapy by history:  No  Recommended Plan for Multiple Antipsychotic Therapies: NA    Cleone Hulick, PMH-NP 11/18/2013, 12:44 PM

## 2013-11-18 NOTE — BH Assessment (Signed)
Per, Rocky Link at [facility] the patient has been declined due to having [employer].  The patient is aware that he will be discharged with outpatient resources.

## 2013-11-18 NOTE — BH Assessment (Addendum)
Writer informed the patient that the ED does not detox from opiates.  Writer consulted with the ER MD Dr. Rhunette Croft and the consult has been cancelled.   Patient denies SI/HI/Psychosis.  Per Rocky Link, [facility] reports has bed opens.  [facility] intake department is not open and a referral cannot be sent.

## 2013-11-18 NOTE — Consult Note (Signed)
[facility] Face-to-Face Psychiatry Consult   Reason for Consult:  Cocaine abuse Referring Physician:  EDP  Thaer Miyoshi is an 63 y.o. male. Total Time spent with patient: 20 minutes  Assessment: AXIS I:  Substance Abuse AXIS II:  Deferred AXIS III:   Past Medical History  Diagnosis Date  . Depression   . Hepatitis C     a. s/p [name] ~ 2003 in [place], [place] => stopped after 6 mos due to neutropenia - viral load noted to be low at that time;  no follow up since;   b. abdominal u/s 11/13:  diffuse hepatic steatosis and/or hepatocellular disease    . ETOH abuse   . Tobacco abuse   . CAD (coronary artery disease) 11/13    a. 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 Hx of echocardiogram     a. Echo 01/06/12: Mild focal basal hypertrophy the septum, vigorous LV, EF 65-70%, Gr 2 diast dysfn, trivial AI, MAC, trivial MR, trivial TR, PASP 36  . Thrombocytopenia   . Non-STEMI (non-ST elevated myocardial infarction)   . Hepatic steatosis   . Heart attack    AXIS IV:  other psychosocial or environmental problems AXIS V:  61-70 mild symptoms  Plan:  No evidence of imminent risk to self or others at present.  Dr. Darleene Cleaver assessed the patient and concurs with the plan.  Subjective:   Augusten Lipkin is a 63 y.o. male patient does not warrant admission.  HPI:  The patient went on a cocaine binge but feels better today because he can walk and "make better decisions."  Denies suicidal/homicidal ideations, hallucinations, and alcohol abuse.  He is stable for discharge. HPI Elements:   Location:  generalized. Quality:  acute. Severity:  mild. Timing:  intermittent. Duration:  evening. Context:  stressors.  Past Psychiatric History: Past Medical History  Diagnosis Date  . Depression    . Hepatitis C     a. s/p [name] ~ 2003 in [place], [place] => stopped after 6 mos due to neutropenia - viral load noted to be low at that time;  no follow up since;   b. abdominal u/s 11/13:  diffuse hepatic steatosis and/or hepatocellular disease    . ETOH abuse   . Tobacco abuse   . CAD (coronary artery disease) 11/13    a. 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 Hx of echocardiogram     a. Echo 01/06/12: Mild focal basal hypertrophy the septum, vigorous LV, EF 65-70%, Gr 2 diast dysfn, trivial AI, MAC, trivial MR, trivial TR, PASP 36  . Thrombocytopenia   . Non-STEMI (non-ST elevated myocardial infarction)   . Hepatic steatosis   . Heart attack     reports that he has been smoking Cigarettes.  He has been smoking about 0.10 packs per day. He has never used smokeless tobacco. He reports that he uses illicit drugs (Cocaine). He reports that he does not drink alcohol. Family History  Problem Relation Age of Onset  . Heart attack Mother 36  . Diabetes Mother   . Diabetes Maternal Grandfather   .  Heart disease Maternal Grandfather            Allergies:  No Known Allergies  ACT Assessment Complete:  Yes:    Educational Status    Risk to Self: Risk to self with the past 6 months Is patient at risk for suicide?: No Substance abuse history and/or treatment for substance abuse?: Yes  Risk to Others:    Abuse:    Prior Inpatient Therapy:    Prior Outpatient Therapy:    Additional Information:                    Objective: Blood pressure 130/71, pulse 84, temperature 98.1 F (36.7 C), temperature source Oral, resp. rate 16, SpO2 98.00%.There is no weight on file to calculate BMI. Results for orders placed during the hospital encounter of 11/17/13 (from the past 72  hour(s))  CBC     Status: Abnormal   Collection Time    11/17/13  8:34 PM      Result Value Ref Range   WBC 6.3  4.0 - 10.5 K/uL   RBC 4.09 (*) 4.22 - 5.81 MIL/uL   Hemoglobin 14.2  13.0 - 17.0 g/dL   HCT 39.9  39.0 - 52.0 %   MCV 97.6  78.0 - 100.0 fL   MCH 34.7 (*) 26.0 - 34.0 pg   MCHC 35.6  30.0 - 36.0 g/dL   RDW 15.0  11.5 - 15.5 %   Platelets 104 (*) 150 - 400 K/uL   Comment: SPECIMEN CHECKED FOR CLOTS     REPEATED TO VERIFY     PLATELET COUNT CONFIRMED BY SMEAR  COMPREHENSIVE METABOLIC PANEL     Status: Abnormal   Collection Time    11/17/13  8:34 PM      Result Value Ref Range   Sodium 139  137 - 147 mEq/L   Potassium 4.5  3.7 - 5.3 mEq/L   Chloride 102  96 - 112 mEq/L   CO2 27  19 - 32 mEq/L   Glucose, Bld 91  70 - 99 mg/dL   BUN 12  6 - 23 mg/dL   Creatinine, Ser 0.75  0.50 - 1.35 mg/dL   Calcium 9.5  8.4 - 10.5 mg/dL   Total Protein 7.5  6.0 - 8.3 g/dL   Albumin 3.4 (*) 3.5 - 5.2 g/dL   AST 116 (*) 0 - 37 U/L   ALT 95 (*) 0 - 53 U/L   Alkaline Phosphatase 105  39 - 117 U/L   Total Bilirubin 1.3 (*) 0.3 - 1.2 mg/dL   GFR calc non Af Amer >90  >90 mL/min   GFR calc Af Amer >90  >90 mL/min   Comment: (NOTE)     The eGFR has been calculated using the CKD EPI equation.     This calculation has not been validated in all clinical situations.     eGFR's persistently <90 mL/min signify possible Chronic Kidney     Disease.   Anion gap 10  5 - 15  ETHANOL     Status: None   Collection Time    11/17/13  8:34 PM      Result Value Ref Range   Alcohol, Ethyl (B) <11  0 - 11 mg/dL   Comment:            LOWEST DETECTABLE LIMIT FOR     SERUM ALCOHOL IS 11 mg/dL     FOR MEDICAL PURPOSES ONLY  URINE RAPID DRUG SCREEN (HOSP  PERFORMED)     Status: Abnormal   Collection Time    11/17/13  8:43 PM      Result Value Ref Range   Opiates NONE DETECTED  NONE DETECTED   Cocaine POSITIVE (*) NONE DETECTED   Benzodiazepines NONE DETECTED  NONE DETECTED   Amphetamines NONE  DETECTED  NONE DETECTED   Tetrahydrocannabinol NONE DETECTED  NONE DETECTED   Barbiturates NONE DETECTED  NONE DETECTED   Comment:            DRUG SCREEN FOR MEDICAL PURPOSES     ONLY.  IF CONFIRMATION IS NEEDED     FOR ANY PURPOSE, NOTIFY LAB     WITHIN 5 DAYS.                LOWEST DETECTABLE LIMITS     FOR URINE DRUG SCREEN     Drug Class       Cutoff (ng/mL)     Amphetamine      1000     Barbiturate      200     Benzodiazepine   035     Tricyclics       465     Opiates          300     Cocaine          300     THC              29  I-STAT TROPOININ, ED     Status: None   Collection Time    11/17/13  8:43 PM      Result Value Ref Range   Troponin i, poc 0.00  0.00 - 0.08 ng/mL   Comment 3            Comment: Due to the release kinetics of cTnI,     a negative result within the first hours     of the onset of symptoms does not rule out     myocardial infarction with certainty.     If myocardial infarction is still suspected,     repeat the test at appropriate intervals.   Labs are reviewed and are pertinent for no medical issues noted.  Current Facility-Administered Medications  Medication Dose Route Frequency Provider Last Rate Last Dose  . acetaminophen (TYLENOL) tablet 650 mg  650 mg Oral Q4H PRN Jasper Riling. Pickering, MD      . alum & mag hydroxide-simeth (MAALOX/MYLANTA) 200-200-20 MG/5ML suspension 30 mL  30 mL Oral PRN Jasper Riling. Pickering, MD      . ibuprofen (ADVIL,MOTRIN) tablet 600 mg  600 mg Oral Q8H PRN Jasper Riling. Pickering, MD      . LORazepam (ATIVAN) tablet 0-4 mg  0-4 mg Oral 4 times per day Jasper Riling. Pickering, MD   2 mg at 11/18/13 0046   Followed by  . [START ON 11/20/2013] LORazepam (ATIVAN) tablet 0-4 mg  0-4 mg Oral Q12H Nathan R. Pickering, MD      . LORazepam (ATIVAN) tablet 1 mg  1 mg Oral Q8H PRN Jasper Riling. Pickering, MD      . ondansetron [facility]) tablet 4 mg  4 mg Oral Q8H PRN Jasper Riling. Pickering, MD      . thiamine (VITAMIN B-1) tablet 100 mg  100  mg Oral Daily Jasper Riling. Pickering, MD   100 mg at 11/18/13 6812   Or  . thiamine (B-1) injection 100 mg  100 mg Intravenous Daily Jasper Riling. Alvino Chapel, MD  Current Outpatient Prescriptions  Medication Sig Dispense Refill  . aspirin EC 81 MG tablet Take 81 mg by mouth daily.      . cetirizine (ZYRTEC) 10 MG tablet Take 10 mg by mouth daily.      . citalopram (CELEXA) 40 MG tablet Take 1 tablet (40 mg total) by mouth daily.  30 tablet  6  . Multiple Vitamin (MULTIVITAMIN) tablet Take 1 tablet by mouth daily.      . nitroGLYCERIN (NITROSTAT) 0.4 MG SL tablet Place 0.4 mg under the tongue every 5 (five) minutes as needed for chest pain.        Psychiatric Specialty Exam:     Blood pressure 130/71, pulse 84, temperature 98.1 F (36.7 C), temperature source Oral, resp. rate 16, SpO2 98.00%.There is no weight on file to calculate BMI.  General Appearance: Casual  Eye Contact::  Good  Speech:  Normal Rate  Volume:  Normal  Mood:  Euthymic  Affect:  Congruent  Thought Process:  Coherent  Orientation:  Full (Time, Place, and Person)  Thought Content:  WDL  Suicidal Thoughts:  No  Homicidal Thoughts:  No  Memory:  Immediate;   Good Recent;   Good Remote;   Good  Judgement:  Fair  Insight:  Fair  Psychomotor Activity:  Normal  Concentration:  Fair  Recall:  Good  Fund of Knowledge:Good  Language: Good  Akathisia:  No  Handed:  Right  AIMS (if indicated):     Assets:  Catering manager Housing Leisure Time Resilience Social Support  Sleep:      Musculoskeletal: Strength & Muscle Tone: within normal limits Gait & Station: normal Patient leans: N/A  Treatment Plan Summary: Discharge home with follow-up resources.  Waylan Boga, [place] 11/18/2013 12:38 PM  Patient seen, evaluated and I agree with notes by Nurse Practitioner. Corena Pilgrim, MD

## 2013-11-18 NOTE — ED Notes (Signed)
Pt reports that he is feeling better this morning "just tired." He denies detox symptoms at this time. Pt denies si and hi.

## 2013-12-07 ENCOUNTER — Telehealth: Payer: Self-pay | Admitting: Family Medicine

## 2013-12-07 MED ORDER — NICOTINE 14 MG/24HR TD PT24: 14.0000 mg | MEDICATED_PATCH | Freq: Every day | TRANSDERMAL | Status: AC

## 2013-12-07 NOTE — Telephone Encounter (Signed)
Ok for refill on 14 mg patches

## 2013-12-07 NOTE — Telephone Encounter (Signed)
Please advise this med was last filled 01/2012. Pt was last seen on 07/21/13

## 2013-12-07 NOTE — Telephone Encounter (Signed)
Caller name: Atharv Relation to pt: self Call back number: [phone] Pharmacy: [facility] [address]  Reason for call:   Patient states that he has talked to Dr. Beverely Low regarding nicotene patches and is requesting a new rx sent to [facility]

## 2013-12-07 NOTE — Telephone Encounter (Signed)
Med filled, spoke with pt per his request sent to [facility] on [address].

## 2013-12-13 ENCOUNTER — Encounter (HOSPITAL_COMMUNITY): Payer: Self-pay | Admitting: Emergency Medicine

## 2013-12-13 ENCOUNTER — Emergency Department (HOSPITAL_COMMUNITY): Payer: BC Managed Care – PPO

## 2013-12-13 ENCOUNTER — Emergency Department (HOSPITAL_COMMUNITY)
Admission: EM | Admit: 2013-12-13 | Discharge: 2013-12-13 | Disposition: A | Discharge status: Home / Self Care | Payer: BC Managed Care – PPO | Attending: Emergency Medicine | Admitting: Emergency Medicine

## 2013-12-13 DIAGNOSIS — Z862 Personal history of diseases of the blood and blood-forming organs and certain disorders involving the immune mechanism: Secondary | ICD-10-CM | POA: Insufficient documentation

## 2013-12-13 DIAGNOSIS — Y9289 Other specified places as the place of occurrence of the external cause: Secondary | ICD-10-CM | POA: Diagnosis not present

## 2013-12-13 DIAGNOSIS — Z79899 Other long term (current) drug therapy: Secondary | ICD-10-CM | POA: Insufficient documentation

## 2013-12-13 DIAGNOSIS — Y9389 Activity, other specified: Secondary | ICD-10-CM | POA: Insufficient documentation

## 2013-12-13 DIAGNOSIS — X58XXXA Exposure to other specified factors, initial encounter: Secondary | ICD-10-CM | POA: Diagnosis not present

## 2013-12-13 DIAGNOSIS — F329 Major depressive disorder, single episode, unspecified: Secondary | ICD-10-CM | POA: Diagnosis not present

## 2013-12-13 DIAGNOSIS — Z8719 Personal history of other diseases of the digestive system: Secondary | ICD-10-CM | POA: Diagnosis not present

## 2013-12-13 DIAGNOSIS — S99911A Unspecified injury of right ankle, initial encounter: Secondary | ICD-10-CM | POA: Diagnosis present

## 2013-12-13 DIAGNOSIS — Z8619 Personal history of other infectious and parasitic diseases: Secondary | ICD-10-CM | POA: Diagnosis not present

## 2013-12-13 DIAGNOSIS — Z7982 Long term (current) use of aspirin: Secondary | ICD-10-CM | POA: Diagnosis not present

## 2013-12-13 DIAGNOSIS — I2510 Atherosclerotic heart disease of native coronary artery without angina pectoris: Secondary | ICD-10-CM | POA: Diagnosis not present

## 2013-12-13 DIAGNOSIS — Z720 Tobacco use: Secondary | ICD-10-CM | POA: Diagnosis not present

## 2013-12-13 DIAGNOSIS — S93401A Sprain of unspecified ligament of right ankle, initial encounter: Secondary | ICD-10-CM | POA: Diagnosis not present

## 2013-12-13 DIAGNOSIS — I252 Old myocardial infarction: Secondary | ICD-10-CM | POA: Diagnosis not present

## 2013-12-13 NOTE — ED Provider Notes (Addendum)
CSN: [id number]     Arrival date & time 12/13/13  1853 History   First MD Initiated Contact with Patient 12/13/13 1917     Chief Complaint  Patient presents with  . Ankle Injury     (Consider location/radiation/quality/duration/timing/severity/associated sxs/prior Treatment) Patient is a 63 y.o. male presenting with lower extremity injury. The history is provided by the patient.  Ankle Injury This is a new problem. The current episode started 3 to 5 hours ago. The problem occurs constantly. The problem has not changed since onset.Associated symptoms comments: Jumping a fence and landed on concrete.  Unable to put weight on the ankle due to pain.  . The symptoms are aggravated by walking and standing. The symptoms are relieved by rest and ice. He has tried rest for the symptoms.    Past Medical History  Diagnosis Date  . Depression   . Hepatitis C     a. s/p [name] ~ 2003 in [place], [place] => stopped after 6 mos due to neutropenia - viral load noted to be low at that time;  no follow up since;   b. abdominal u/s 11/13:  diffuse hepatic steatosis and/or hepatocellular disease    . ETOH abuse   . Tobacco abuse   . CAD (coronary artery disease) 11/13    a. 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 Hx of echocardiogram     a. Echo 01/06/12: Mild focal basal hypertrophy the septum, vigorous LV, EF 65-70%, Gr 2 diast dysfn, trivial AI, MAC, trivial MR, trivial TR, PASP 36  . Thrombocytopenia   . Non-STEMI (non-ST elevated myocardial infarction)   . Hepatic steatosis   . Heart attack    Past Surgical History  Procedure Laterality Date  . Eye surgery     Family History  Problem Relation Age of Onset  . Heart attack Mother 27  . Diabetes Mother   . Diabetes Maternal Grandfather   .  Heart disease Maternal Grandfather    History  Substance Use Topics  . Smoking status: Light Tobacco Smoker -- 0.10 packs/day    Types: Cigarettes  . Smokeless tobacco: Never Used     Comment: almost quit, very rare, 1-2 daily  . Alcohol Use: No     Comment: 2-3 beers on weekends, previous 6-8 beers daily. Patient stopped drinking 5 months prior 05/27/13    Review of Systems  All other systems reviewed and are negative.     Allergies  Review of patient's allergies indicates no known allergies.  Home Medications   Prior to Admission medications   Medication Sig Start Date End Date Taking? Authorizing Provider  aspirin EC 81 MG tablet Take 81 mg by mouth daily.    Historical Provider, MD  cetirizine (ZYRTEC) 10 MG tablet Take 10 mg by mouth daily.    Historical Provider, MD  citalopram (CELEXA) 40 MG tablet Take 1 tablet (40 mg total) by mouth daily. 07/21/13   Sheliah Hatch, MD  Multiple Vitamin (MULTIVITAMIN) tablet Take 1 tablet by mouth daily.    Historical Provider, MD  nicotine (NICODERM CQ - DOSED IN MG/24 HOURS) 14 mg/24hr patch Place 1 patch (14 mg total) onto the skin daily. 12/07/13 12/28/13  Sheliah Hatch, MD  nitroGLYCERIN (NITROSTAT) 0.4 MG SL tablet Place 0.4 mg  under the tongue every 5 (five) minutes as needed for chest pain.    Historical Provider, MD   BP 115/62  Pulse 80  Temp(Src) 98.3 F (36.8 C) (Oral)  SpO2 98% Physical Exam  Nursing note and vitals reviewed. Constitutional: He is oriented to person, place, and time. He appears well-developed and well-nourished. No distress.  HENT:  Head: Normocephalic and atraumatic.  Cardiovascular: Normal rate.   Pulmonary/Chest: Effort normal.  Musculoskeletal:       Right ankle: He exhibits swelling and ecchymosis. Tenderness. Lateral malleolus tenderness found. No medial malleolus, no head of 5th metatarsal and no proximal fibula tenderness found.  Neurological: He is alert and oriented to person, place,  and time.  Skin: Skin is warm and dry.  Psychiatric: He has a normal mood and affect. His behavior is normal.    ED Course  Procedures (including critical care time) Labs Review Labs Reviewed - No data to display  Imaging Review Dg Ankle Complete Right  12/13/2013   CLINICAL DATA:  Right ankle pain/swelling  EXAM: RIGHT ANKLE - COMPLETE 3+ VIEW  COMPARISON:  None.  FINDINGS: No fracture or dislocation is seen.  Well corticated osseous densities inferior to the medial and lateral malleolus are not acute.  The ankle mortise is intact.  The base of the fifth metatarsal is unremarkable.  Lateral soft tissue swelling.  IMPRESSION: No fracture or dislocation is seen.  Lateral soft tissue swelling.   Electronically Signed   By: Charline BillsSriyesh  Krishnan M.D.   On: 12/13/2013 19:35     EKG Interpretation None      MDM   Final diagnoses:  Ankle sprain, right, initial encounter    Patient with an ankle injury after trying to jump a fence. His only complaint is of right ankle pain with swelling and ecchymosis. Plain imaging is negative patient placed in an ankle brace and given crutches and orthopedic followup. There is a report of alcohol use however patient is a chronic alcoholic and currently is alert and oriented    Gwyneth SproutWhitney Maikol Grassia, MD 12/13/13 [id number]  Gwyneth SproutWhitney Christian Treadway, MD 12/13/13 2055

## 2013-12-13 NOTE — Discharge Instructions (Signed)

## 2013-12-13 NOTE — ED Notes (Addendum)
Pt states that he jumped a fence at storage facility and now has swelling and pain in R ankle. Alert and oriented. ETOH.

## 2013-12-13 NOTE — ED Notes (Addendum)
Pt being very rude with staff reporting that nobody has seen him. Explained to pt that ortho tech has been called and they are coming to put on his splint and give him some crutches.

## 2013-12-13 NOTE — ED Notes (Signed)
Bed: WTR7 Expected date:  Expected time:  Means of arrival:  Comments: EMS 

## 2013-12-13 NOTE — ED Provider Notes (Signed)
MSE was initiated and I personally evaluated the patient and placed orders (if any) at  7:42 PM on December 13, 2013.  Hunter Mcmillan is a 63 y.o. male who presents to the Emergency Department complaining of a right ankle injury 1 hour PTA. Patient was jumping over a 6-foot fence and landed on concrete. He denies head injury or LOC. He explains that his pain is only present when putting weight on his ankle; his pain is localized on the outside of his ankle, rated 4/10 at present and 10/10 with weight-bearing. He has not taken any medications to treat his pain; he had two tall beers prior to his injury and is currently intoxicated. Due to the fact that he's had EtOH, and has a distracting injury, and a fall of approx 21ft, I feel further evaluation would be necessary to rule out any head or neck/back injury has occurred, given that according to [place] head CT rules he would warrant a trauma work up. Pt moved at this time.   The patient appears stable so that the remainder of the MSE may be completed by another provider.  Donnita [place], [place] 12/13/13 1944

## 2013-12-20 NOTE — ED Provider Notes (Signed)
Medical screening examination/treatment/procedure(s) were performed by non-physician practitioner and as supervising physician I was immediately available for consultation/collaboration.   EKG Interpretation None        Gwyneth Sprout, MD 12/20/13 2007

## 2014-02-09 ENCOUNTER — Encounter (HOSPITAL_COMMUNITY): Payer: Self-pay | Admitting: Cardiology

## 2014-05-09 ENCOUNTER — Ambulatory Visit (INDEPENDENT_AMBULATORY_CARE_PROVIDER_SITE_OTHER): Payer: 59 | Admitting: Family Medicine

## 2014-05-09 ENCOUNTER — Ambulatory Visit (INDEPENDENT_AMBULATORY_CARE_PROVIDER_SITE_OTHER): Payer: 59

## 2014-05-09 VITALS — BP 150/80 | HR 84 | Temp 98.3°F | Resp 18

## 2014-05-09 DIAGNOSIS — J441 Chronic obstructive pulmonary disease with (acute) exacerbation: Secondary | ICD-10-CM

## 2014-05-09 DIAGNOSIS — R0602 Shortness of breath: Secondary | ICD-10-CM

## 2014-05-09 DIAGNOSIS — F17200 Nicotine dependence, unspecified, uncomplicated: Secondary | ICD-10-CM

## 2014-05-09 DIAGNOSIS — Z720 Tobacco use: Secondary | ICD-10-CM

## 2014-05-09 LAB — POCT SEDIMENTATION RATE: POCT SED RATE: 73 mm/hr — AB (ref 0–22)

## 2014-05-09 LAB — POCT CBC
Granulocyte percent: 57.2 %G (ref 37–80)
HCT, POC: 46.1 % (ref 43.5–53.7)
Hemoglobin: 15.7 g/dL (ref 14.1–18.1)
Lymph, poc: 2.3 (ref 0.6–3.4)
MCH, POC: 35 pg — AB (ref 27–31.2)
MCHC: 34.1 g/dL (ref 31.8–35.4)
MCV: 102.6 fL — AB (ref 80–97)
MID (cbc): 0.6 (ref 0–0.9)
MPV: 7.6 fL (ref 0–99.8)
POC Granulocyte: 3.8 (ref 2–6.9)
POC LYMPH PERCENT: 34.1 %L (ref 10–50)
POC MID %: 8.7 %M (ref 0–12)
Platelet Count, POC: 121 10*3/uL — AB (ref 142–424)
RBC: 4.49 M/uL — AB (ref 4.69–6.13)
RDW, POC: 14.4 %
WBC: 6.6 10*3/uL (ref 4.6–10.2)

## 2014-05-09 MED ORDER — IPRATROPIUM-ALBUTEROL 20-100 MCG/ACT IN AERS: 1.0000 | INHALATION_SPRAY | Freq: Four times a day (QID) | RESPIRATORY_TRACT | Status: DC

## 2014-05-09 MED ORDER — PREDNISONE 20 MG PO TABS: 40.0000 mg | ORAL_TABLET | Freq: Every day | ORAL | Status: DC

## 2014-05-09 MED ORDER — AZITHROMYCIN 250 MG PO TABS: ORAL_TABLET | ORAL | Status: DC

## 2014-05-09 MED ORDER — BENZONATATE 200 MG PO CAPS: 200.0000 mg | ORAL_CAPSULE | Freq: Three times a day (TID) | ORAL | Status: DC | PRN

## 2014-05-09 MED ORDER — ALBUTEROL SULFATE (2.5 MG/3ML) 0.083% IN NEBU: 2.5000 mg | INHALATION_SOLUTION | Freq: Once | RESPIRATORY_TRACT | Status: DC

## 2014-05-09 MED ORDER — IPRATROPIUM BROMIDE 0.02 % IN SOLN: 0.5000 mg | Freq: Once | RESPIRATORY_TRACT | Status: DC

## 2014-05-09 NOTE — Progress Notes (Addendum)
This chart was scribed for Norberto Sorenson, MD by Luisa Dago, ED Scribe. This patient was seen in room 6 and the patient's care was started at 3:50 PM.  Subjective:    Patient ID: Hunter Mcmillan, male    DOB: [birth year], 64 y.o.   MRN: [id number] Chief Complaint  Patient presents with  . Cough    x1 week; has coughed so much he has lost his voice; yellow sputum  . Shortness of Breath    constant; difficult to take a deep breath; denies having any chest pain  . fluid in lungs    states he has fluid in his lungs  . Sore Throat    started off as an bad sore throat; he states now that his throat is numb    HPI Hunter Mcmillan is a 64 y.o. male with PMhx of a stoke that occurred approximately 5 years ago who presents to the office today with multiple complaints. He is complaining of 1 week productive cough with yellow sputum. Pt states that he has been coughing so much that it has caused him to lose his voice. He also endorses associated SOB and sore throat that started prior to the onset of his cough. Pt states that currently his throat is numb and he cant really feel the pain. He reports taking several cold OTC medication and applying Vic's vapor rub.   Pt has a hx of substance abuse, tobacco use, and CAD.   Patient Active Problem List   Diagnosis Date Noted  . Cocaine abuse 11/18/2013  . Scrotal mass 07/21/2013  . Cirrhosis 05/28/2013  . Routine general medical examination at a health care facility 07/19/2012  . Abnormal TSH 04/09/2012  . Coronary atherosclerosis of native coronary artery 01/14/2012  . NSTEMI (non-ST elevated myocardial infarction) 01/05/2012  . Alcohol abuse 01/05/2012  . Tobacco abuse 01/05/2012  . Hepatitis C 01/05/2012  . Depression 01/05/2012  . Thrombocytopenia 01/05/2012   Past Medical History  Diagnosis Date  . Depression   . Hepatitis C     a. s/p [name] ~ 2003 in [place], [place] => stopped after 6 mos due to neutropenia - viral load  noted to be low at that time;  no follow up since;   b. abdominal u/s 11/13:  diffuse hepatic steatosis and/or hepatocellular disease    . ETOH abuse   . Tobacco abuse   . CAD (coronary artery disease) 11/13    a. 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 Hx of echocardiogram     a. Echo 01/06/12: Mild focal basal hypertrophy the septum, vigorous LV, EF 65-70%, Gr 2 diast dysfn, trivial AI, MAC, trivial MR, trivial TR, PASP 36  . Thrombocytopenia   . Non-STEMI (non-ST elevated myocardial infarction)   . Hepatic steatosis   . Heart attack    Past Surgical History  Procedure Laterality Date  . Eye surgery    . Left heart catheterization with coronary angiogram N/A 01/05/2012    Procedure: LEFT HEART CATHETERIZATION WITH CORONARY ANGIOGRAM;  Surgeon: Laurey Morale, MD;  Location: [facility] CATH LAB;  Service: Cardiovascular;  Laterality: N/A;   No Known Allergies Prior to Admission medications   Medication Sig Start Date End Date Taking? Authorizing Provider  aspirin EC 81 MG tablet Take 81 mg  by mouth daily.   Yes Historical Provider, MD  cetirizine (ZYRTEC) 10 MG tablet Take 10 mg by mouth daily.   Yes Historical Provider, MD  citalopram (CELEXA) 40 MG tablet Take 1 tablet (40 mg total) by mouth daily. 07/21/13  Yes Sheliah Hatch, MD  Multiple Vitamin (MULTIVITAMIN) tablet Take 1 tablet by mouth daily.   Yes Historical Provider, MD  nitroGLYCERIN (NITROSTAT) 0.4 MG SL tablet Place 0.4 mg under the tongue every 5 (five) minutes as needed for chest pain.   Yes Historical Provider, MD   History   Social History  . Marital Status: Single    Spouse Name: N/A  . Number of Children: 1  . Years of Education: N/A   Occupational History  . Curator     retired   Social History Main Topics  . Smoking  status: Light Tobacco Smoker -- 0.10 packs/day    Types: Cigarettes  . Smokeless tobacco: Never Used     Comment: almost quit, very rare, 1-2 daily  . Alcohol Use: No     Comment: 2-3 beers on weekends, previous 6-8 beers daily. Patient stopped drinking 5 months prior 05/27/13  . Drug Use: Yes    Special: Cocaine  . Sexual Activity: Not on file   Other Topics Concern  . Not on file   Social History Narrative     Review of Systems  Constitutional: Negative for fever and chills.  HENT: Positive for congestion and sore throat. Negative for ear pain, rhinorrhea, sinus pressure, trouble swallowing and voice change.   Eyes: Negative for discharge and visual disturbance.  Respiratory: Positive for cough and shortness of breath. Negative for wheezing and stridor.   Cardiovascular: Negative for chest pain and leg swelling.  Gastrointestinal: Negative for nausea, vomiting, abdominal pain and diarrhea.  Genitourinary: Negative.  Negative for dysuria.  Musculoskeletal: Negative for back pain and neck pain.  Skin: Negative for rash.  Neurological: Negative for headaches.   BP 150/80 mmHg  Pulse 84  Temp(Src) 98.3 F (36.8 C) (Oral)  Resp 18  SpO2 96%  Objective:   Physical Exam  Constitutional: He appears well-developed and well-nourished. No distress.  HENT:  Head: Normocephalic and atraumatic.  Right Ear: Tympanic membrane, external ear and ear canal normal.  Left Ear: Tympanic membrane, external ear and ear canal normal.  Nose: Rhinorrhea and septal deviation present. No mucosal edema.  Mouth/Throat: Mucous membranes are not pale and dry. Abnormal dentition. Dental caries present. Posterior oropharyngeal erythema present. No oropharyngeal exudate or posterior oropharyngeal edema.  Nasal mucosa dry with erythema and purulent rhniorrhea - abnormality of right nasal septum noted  Eyes: Conjunctivae are normal. Right eye exhibits no discharge. Left eye exhibits no discharge.  Neck:  Neck supple. No thyromegaly present.  Cardiovascular: Normal rate, regular rhythm, S1 normal, S2 normal and normal heart sounds.  Exam reveals no gallop and no friction rub.   No murmur heard. Pulmonary/Chest: Effort normal and breath sounds normal. No respiratory distress.  Lungs clear to auscultation with good breath sounds. Except prolonged expiratory phase with exhalation .  Significant improvement in exam and air movement after duoneb.  Abdominal: Soft. He exhibits no distension. There is no tenderness.  Musculoskeletal: He exhibits no edema or tenderness.  Neurological: He is alert.  Skin: Skin is warm and dry.  Psychiatric: He has a normal mood and affect. His behavior is normal. Thought content normal.  Nursing note and vitals reviewed.  Peak flow reading is 410, and his ideal  peak flow reading is 575.  He had significant improvement after duoneb in office.  Results for orders placed or performed in visit on 05/09/14  POCT CBC  Result Value Ref Range   WBC 6.6 4.6 - 10.2 K/uL   Lymph, poc 2.3 0.6 - 3.4   POC LYMPH PERCENT 34.1 10 - 50 %L   MID (cbc) 0.6 0 - 0.9   POC MID % 8.7 0 - 12 %M   POC Granulocyte 3.8 2 - 6.9   Granulocyte percent 57.2 37 - 80 %G   RBC 4.49 (A) 4.69 - 6.13 M/uL   Hemoglobin 15.7 14.1 - 18.1 g/dL   HCT, POC 37.9 02.4 - 53.7 %   MCV 102.6 (A) 80 - 97 fL   MCH, POC 35.0 (A) 27 - 31.2 pg   MCHC 34.1 31.8 - 35.4 g/dL   RDW, POC 09.7 %   Platelet Count, POC 121 (A) 142 - 424 K/uL   MPV 7.6 0 - 99.8 fL  . [facility] reading (PRIMARY) by  Dr. Clelia Croft. CXR: COPD changes with thickening of interstitium  Dg Chest 2 View  05/09/2014   CLINICAL DATA:  Shortness of breath  EXAM: CHEST  2 VIEW  COMPARISON:  None.  FINDINGS: Cardiac shadow is within normal limits. The lungs are mildly hyperinflated consistent with COPD. No focal infiltrate or sizable effusion is seen. Degenerative changes of the thoracic spine are noted  IMPRESSION: COPD without acute abnormality.    Electronically Signed   By: Alcide Clever M.D.   On: 05/09/2014 16:48    Assessment & Plan:   SOB (shortness of breath) - Plan: POCT CBC, POCT SEDIMENTATION RATE, DG Chest 2 View, albuterol (PROVENTIL) (2.5 MG/3ML) 0.083% nebulizer solution 2.5 mg, ipratropium (ATROVENT) nebulizer solution 0.5 mg  COPD exacerbation  Tobacco use disorder  Meds ordered this encounter  Medications  . albuterol (PROVENTIL) (2.5 MG/3ML) 0.083% nebulizer solution 2.5 mg    Sig:   . ipratropium (ATROVENT) nebulizer solution 0.5 mg    Sig:   . Ipratropium-Albuterol (COMBIVENT) 20-100 MCG/ACT AERS respimat    Sig: Inhale 1 puff into the lungs every 6 (six) hours.    Dispense:  4 g    Refill:  1  . azithromycin (ZITHROMAX) 250 MG tablet    Sig: Take 2 tabs PO x 1 dose, then 1 tab PO QD x 4 days    Dispense:  6 tablet    Refill:  0  . predniSONE (DELTASONE) 20 MG tablet    Sig: Take 2 tablets (40 mg total) by mouth daily with breakfast.    Dispense:  10 tablet    Refill:  0  . benzonatate (TESSALON) 200 MG capsule    Sig: Take 1 capsule (200 mg total) by mouth 3 (three) times daily as needed for cough.    Dispense:  30 capsule    Refill:  0    I personally performed the services described in this documentation, which was scribed in my presence. The recorded information has been reviewed and considered, and addended by me as needed.  Norberto Sorenson, MD MPH

## 2014-05-09 NOTE — Patient Instructions (Signed)

## 2014-05-25 ENCOUNTER — Encounter: Payer: Self-pay | Admitting: Family Medicine

## 2014-06-15 ENCOUNTER — Emergency Department (HOSPITAL_COMMUNITY): Payer: Medicare Other

## 2014-06-15 ENCOUNTER — Encounter (HOSPITAL_COMMUNITY): Payer: Self-pay

## 2014-06-15 ENCOUNTER — Emergency Department (HOSPITAL_COMMUNITY)
Admission: EM | Admit: 2014-06-15 | Discharge: 2014-06-17 | Disposition: A | Discharge status: Home / Self Care | Payer: Medicare Other | Attending: Emergency Medicine | Admitting: Emergency Medicine

## 2014-06-15 DIAGNOSIS — Z79899 Other long term (current) drug therapy: Secondary | ICD-10-CM | POA: Insufficient documentation

## 2014-06-15 DIAGNOSIS — Z862 Personal history of diseases of the blood and blood-forming organs and certain disorders involving the immune mechanism: Secondary | ICD-10-CM | POA: Insufficient documentation

## 2014-06-15 DIAGNOSIS — I252 Old myocardial infarction: Secondary | ICD-10-CM | POA: Diagnosis not present

## 2014-06-15 DIAGNOSIS — F329 Major depressive disorder, single episode, unspecified: Secondary | ICD-10-CM | POA: Diagnosis not present

## 2014-06-15 DIAGNOSIS — F1020 Alcohol dependence, uncomplicated: Secondary | ICD-10-CM | POA: Diagnosis present

## 2014-06-15 DIAGNOSIS — Z7952 Long term (current) use of systemic steroids: Secondary | ICD-10-CM | POA: Diagnosis not present

## 2014-06-15 DIAGNOSIS — Z720 Tobacco use: Secondary | ICD-10-CM | POA: Insufficient documentation

## 2014-06-15 DIAGNOSIS — Z7982 Long term (current) use of aspirin: Secondary | ICD-10-CM | POA: Insufficient documentation

## 2014-06-15 DIAGNOSIS — I2510 Atherosclerotic heart disease of native coronary artery without angina pectoris: Secondary | ICD-10-CM | POA: Insufficient documentation

## 2014-06-15 DIAGNOSIS — Z9889 Other specified postprocedural states: Secondary | ICD-10-CM | POA: Insufficient documentation

## 2014-06-15 DIAGNOSIS — F1010 Alcohol abuse, uncomplicated: Secondary | ICD-10-CM | POA: Diagnosis present

## 2014-06-15 DIAGNOSIS — Z8619 Personal history of other infectious and parasitic diseases: Secondary | ICD-10-CM | POA: Diagnosis not present

## 2014-06-15 DIAGNOSIS — Z8659 Personal history of other mental and behavioral disorders: Secondary | ICD-10-CM

## 2014-06-15 DIAGNOSIS — Z8719 Personal history of other diseases of the digestive system: Secondary | ICD-10-CM | POA: Insufficient documentation

## 2014-06-15 DIAGNOSIS — R9431 Abnormal electrocardiogram [ECG] [EKG]: Secondary | ICD-10-CM | POA: Diagnosis not present

## 2014-06-15 LAB — CBC WITH DIFFERENTIAL/PLATELET
Basophils Absolute: 0 10*3/uL (ref 0.0–0.1)
Basophils Relative: 0 % (ref 0–1)
Eosinophils Absolute: 0.1 10*3/uL (ref 0.0–0.7)
Eosinophils Relative: 2 % (ref 0–5)
HCT: 44.7 % (ref 39.0–52.0)
Hemoglobin: 15.7 g/dL (ref 13.0–17.0)
Lymphocytes Relative: 56 % — ABNORMAL HIGH (ref 12–46)
Lymphs Abs: 3.8 10*3/uL (ref 0.7–4.0)
MCH: 35.4 pg — ABNORMAL HIGH (ref 26.0–34.0)
MCHC: 35.1 g/dL (ref 30.0–36.0)
MCV: 100.7 fL — ABNORMAL HIGH (ref 78.0–100.0)
Monocytes Absolute: 0.6 10*3/uL (ref 0.1–1.0)
Monocytes Relative: 9 % (ref 3–12)
Neutro Abs: 2.1 10*3/uL (ref 1.7–7.7)
Neutrophils Relative %: 32 % — ABNORMAL LOW (ref 43–77)
Platelets: 100 10*3/uL — ABNORMAL LOW (ref 150–400)
RBC: 4.44 MIL/uL (ref 4.22–5.81)
RDW: 14.2 % (ref 11.5–15.5)
WBC: 6.7 10*3/uL (ref 4.0–10.5)

## 2014-06-15 LAB — COMPREHENSIVE METABOLIC PANEL
ALT: 124 U/L — ABNORMAL HIGH (ref 0–53)
AST: 130 U/L — ABNORMAL HIGH (ref 0–37)
Albumin: 3.7 g/dL (ref 3.5–5.2)
Alkaline Phosphatase: 155 U/L — ABNORMAL HIGH (ref 39–117)
Anion gap: 9 (ref 5–15)
BUN: 16 mg/dL (ref 6–23)
CO2: 25 mmol/L (ref 19–32)
Calcium: 8.7 mg/dL (ref 8.4–10.5)
Chloride: 108 mmol/L (ref 96–112)
Creatinine, Ser: 1.43 mg/dL — ABNORMAL HIGH (ref 0.50–1.35)
GFR calc Af Amer: 58 mL/min — ABNORMAL LOW (ref >90)
GFR calc non Af Amer: 50 mL/min — ABNORMAL LOW (ref >90)
Glucose, Bld: 125 mg/dL — ABNORMAL HIGH (ref 70–99)
Potassium: 4 mmol/L (ref 3.5–5.1)
Sodium: 142 mmol/L (ref 135–145)
Total Bilirubin: 0.7 mg/dL (ref 0.3–1.2)
Total Protein: 7.7 g/dL (ref 6.0–8.3)

## 2014-06-15 LAB — ETHANOL: Alcohol, Ethyl (B): 235 mg/dL — ABNORMAL HIGH (ref 0–9)

## 2014-06-15 LAB — RAPID URINE DRUG SCREEN, HOSP PERFORMED
Amphetamines: NOT DETECTED
Barbiturates: NOT DETECTED
Benzodiazepines: NOT DETECTED
Cocaine: NOT DETECTED
Opiates: NOT DETECTED
Tetrahydrocannabinol: NOT DETECTED

## 2014-06-15 MED ORDER — IBUPROFEN 200 MG PO TABS: 600.0000 mg | ORAL_TABLET | Freq: Three times a day (TID) | ORAL | Status: DC | PRN

## 2014-06-15 MED ORDER — LORAZEPAM 2 MG/ML IJ SOLN
2.0000 mg | Freq: Once | INTRAMUSCULAR | Status: AC
  Administered 2014-06-15: 2 mg via INTRAMUSCULAR
  Filled 2014-06-15: qty 1

## 2014-06-15 MED ORDER — LORAZEPAM 1 MG PO TABS
0.0000 mg | ORAL_TABLET | Freq: Four times a day (QID) | ORAL | Status: DC
  Administered 2014-06-17: 1 mg via ORAL
  Filled 2014-06-15: qty 1

## 2014-06-15 MED ORDER — THIAMINE HCL 100 MG/ML IJ SOLN: 100.0000 mg | Freq: Every day | INTRAMUSCULAR | Status: DC

## 2014-06-15 MED ORDER — VITAMIN B-1 100 MG PO TABS
100.0000 mg | ORAL_TABLET | Freq: Every day | ORAL | Status: DC
  Administered 2014-06-16 – 2014-06-17 (×2): 100 mg via ORAL
  Filled 2014-06-15 (×2): qty 1

## 2014-06-15 MED ORDER — ONDANSETRON HCL 4 MG PO TABS: 4.0000 mg | ORAL_TABLET | Freq: Three times a day (TID) | ORAL | Status: DC | PRN

## 2014-06-15 MED ORDER — NICOTINE 21 MG/24HR TD PT24
21.0000 mg | MEDICATED_PATCH | Freq: Every day | TRANSDERMAL | Status: DC
Start: 2014-06-16 — End: 2014-06-17

## 2014-06-15 MED ORDER — LORAZEPAM 1 MG PO TABS: 0.0000 mg | ORAL_TABLET | Freq: Two times a day (BID) | ORAL | Status: DC

## 2014-06-15 MED ORDER — ALUM & MAG HYDROXIDE-SIMETH 200-200-20 MG/5ML PO SUSP: 30.0000 mL | ORAL | Status: DC | PRN

## 2014-06-15 MED ORDER — ZOLPIDEM TARTRATE 5 MG PO TABS: 5.0000 mg | ORAL_TABLET | Freq: Every evening | ORAL | Status: DC | PRN

## 2014-06-15 MED ORDER — ACETAMINOPHEN 325 MG PO TABS: 650.0000 mg | ORAL_TABLET | ORAL | Status: DC | PRN

## 2014-06-15 NOTE — ED Notes (Signed)
Pt states that he's severely depressed and is an alcoholic, he states that he's been this way most of his life and most doctors just want him to take medications. Pt states that he doesn't want to kill himself but doesn't really care if if wakes up or not, he's tired of living in pain.

## 2014-06-15 NOTE — ED Notes (Signed)
Upon arrival to psych ed, pt argumentative  And talking loud to staff.  While TTS attempting assessment with pt, Pt cursing at TTS Terri, calling her a nigger woman.  Unable to complete assessment on pt.  Medication order given by Christen Bame , NP for agitation.  Pt tolerated well.  Will attempt to reassess pt in am, when he is calm and cooperative.  Monitoring pt for safety, Q 15 min checks in effect.

## 2014-06-15 NOTE — ED Provider Notes (Signed)
CSN: [id number]     Arrival date & time 06/15/14  2036 History   First MD Initiated Contact with Patient 06/15/14 2101     Chief Complaint  Patient presents with  . Alcohol Problem     (Consider location/radiation/quality/duration/timing/severity/associated sxs/prior Treatment) HPI Hunter Mcmillan is a 64 year old male with past medical history depression, hepatitis C, EtOH abuse who presents to the ER complaining of worsening of depression, and alcoholism. Patient states his depression has been ongoing, and worsening over the past several years, denies any specific events that have changed his condition recently, he states that his family members and friends have all been stating that he needs to get help. Patient states that he has been having thoughts of suicide for the past 25 years, does not have any specific plan. Patient states these suicidal ideations are that he "just doesn't care anymore".  Past Medical History  Diagnosis Date  . Depression   . Hepatitis C     a. s/p [name] ~ 2003 in [place], [place] => stopped after 6 mos due to neutropenia - viral load noted to be low at that time;  no follow up since;   b. abdominal u/s 11/13:  diffuse hepatic steatosis and/or hepatocellular disease    . ETOH abuse   . Tobacco abuse   . CAD (coronary artery disease) 11/13    a. 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 Hx of echocardiogram     a. Echo 01/06/12: Mild focal basal hypertrophy the septum, vigorous LV, EF 65-70%, Gr 2 diast dysfn, trivial AI, MAC, trivial MR, trivial TR, PASP 36  . Thrombocytopenia   . Non-STEMI (non-ST elevated myocardial infarction)   . Hepatic steatosis   . Heart attack    Past Surgical History  Procedure Laterality Date  . Eye surgery    . Left heart  catheterization with coronary angiogram N/A 01/05/2012    Procedure: LEFT HEART CATHETERIZATION WITH CORONARY ANGIOGRAM;  Surgeon: Laurey Morale, MD;  Location: [facility] CATH LAB;  Service: Cardiovascular;  Laterality: N/A;   Family History  Problem Relation Age of Onset  . Heart attack Mother 30  . Diabetes Mother   . Diabetes Maternal Grandfather   . Heart disease Maternal Grandfather    History  Substance Use Topics  . Smoking status: Light Tobacco Smoker -- 0.10 packs/day    Types: Cigarettes  . Smokeless tobacco: Never Used     Comment: almost quit, very rare, 1-2 daily  . Alcohol Use: Yes     Comment: 2-3 beers on weekends, previous 6-8 beers daily. Patient stopped drinking 5 months prior 05/27/13    Review of Systems  Constitutional: Negative for fever.  HENT: Negative for trouble swallowing.   Eyes: Negative for visual disturbance.  Respiratory: Negative for shortness of breath.   Cardiovascular: Negative for chest pain.  Gastrointestinal: Negative for nausea, vomiting and abdominal pain.  Genitourinary: Negative for dysuria.  Musculoskeletal: Negative for neck pain.  Skin: Negative for rash.  Neurological: Negative for dizziness, weakness and numbness.  Psychiatric/Behavioral: Positive for dysphoric mood.      Allergies  Review of patient's allergies indicates no known allergies.  Home Medications   Prior to Admission medications   Medication Sig Start Date End Date Taking? Authorizing Provider  aspirin EC  81 MG tablet Take 81-162 mg by mouth daily.    Yes Historical Provider, MD  Ipratropium-Albuterol (COMBIVENT) 20-100 MCG/ACT AERS respimat Inhale 1 puff into the lungs every 6 (six) hours. 05/09/14  Yes Sherren Mocha, MD  Multiple Vitamin (MULTIVITAMIN) tablet Take 1 tablet by mouth daily.   Yes Historical Provider, MD  nitroGLYCERIN (NITROSTAT) 0.4 MG SL tablet Place 0.4 mg under the tongue every 5 (five) minutes as needed for chest pain.   Yes Historical Provider, MD   azithromycin (ZITHROMAX) 250 MG tablet Take 2 tabs PO x 1 dose, then 1 tab PO QD x 4 days Patient not taking: Reported on 06/15/2014 05/09/14   Sherren Mocha, MD  benzonatate (TESSALON) 200 MG capsule Take 1 capsule (200 mg total) by mouth 3 (three) times daily as needed for cough. Patient not taking: Reported on 06/15/2014 05/09/14   Sherren Mocha, MD  citalopram (CELEXA) 40 MG tablet Take 1 tablet (40 mg total) by mouth daily. Patient not taking: Reported on 06/15/2014 07/21/13   Sheliah Hatch, MD  predniSONE (DELTASONE) 20 MG tablet Take 2 tablets (40 mg total) by mouth daily with breakfast. Patient not taking: Reported on 06/15/2014 05/09/14   Sherren Mocha, MD   BP 133/88 mmHg  Pulse 107  Temp(Src) 97.8 F (36.6 C) (Oral)  Resp 18  SpO2 96% Physical Exam  Constitutional: He is oriented to person, place, and time. He appears well-developed and well-nourished. No distress.  HENT:  Head: Normocephalic and atraumatic.  Mouth/Throat: Oropharynx is clear and moist. No oropharyngeal exudate.  Eyes: Right eye exhibits no discharge. Left eye exhibits no discharge. No scleral icterus.  Neck: Normal range of motion.  Cardiovascular: Normal rate, regular rhythm and normal heart sounds.   No murmur heard. Pulmonary/Chest: Effort normal and breath sounds normal. No respiratory distress.  Abdominal: Soft. There is no tenderness.  Musculoskeletal: Normal range of motion. He exhibits no edema or tenderness.  Neurological: He is alert and oriented to person, place, and time. He has normal strength. No cranial nerve deficit or sensory deficit. Coordination and gait normal. GCS eye subscore is 4. GCS verbal subscore is 5. GCS motor subscore is 6.  Patient fully alert, answering questions appropriately in full, clear sentences. Cranial nerves II through XII grossly intact. Motor strength 5 out of 5 in all major muscle groups of upper and lower extremities. Distal sensation intact.   Skin: Skin is warm and dry. No  rash noted. He is not diaphoretic.  Psychiatric: He has a normal mood and affect.  Nursing note and vitals reviewed.   ED Course  Procedures (including critical care time) Labs Review Labs Reviewed  CBC WITH DIFFERENTIAL/PLATELET - Abnormal; Notable for the following:    MCV 100.7 (*)    MCH 35.4 (*)    Platelets 100 (*)    Neutrophils Relative % 32 (*)    Lymphocytes Relative 56 (*)    All other components within normal limits  COMPREHENSIVE METABOLIC PANEL - Abnormal; Notable for the following:    Glucose, Bld 125 (*)    Creatinine, Ser 1.43 (*)    AST 130 (*)    ALT 124 (*)    Alkaline Phosphatase 155 (*)    GFR calc non Af Amer 50 (*)    GFR calc Af Amer 58 (*)    All other components within normal limits  ETHANOL - Abnormal; Notable for the following:    Alcohol, Ethyl (B) 235 (*)  All other components within normal limits  URINE RAPID DRUG SCREEN (HOSP PERFORMED)    Imaging Review Dg Chest 2 View  06/15/2014   CLINICAL DATA:  ETOH abuse  EXAM: CHEST  2 VIEW  COMPARISON:  05/09/2014  FINDINGS: Lungs are clear.  No pleural effusion or pneumothorax.  The heart is normal in size.  Degenerative changes of the visualized thoracolumbar spine.  IMPRESSION: No evidence of acute cardiopulmonary disease.   Electronically Signed   By: Charline Bills M.D.   On: 06/15/2014 22:24     EKG Interpretation   Date/Time:  Thursday June 15 2014 22:56:06 EDT Ventricular Rate:  84 PR Interval:  138 QRS Duration: 95 QT Interval:  367 QTC Calculation: 434 R Axis:   53 Text Interpretation:  Sinus rhythm Confirmed by OTTER  MD, OLGA ([id number]) on  06/15/2014 11:15:35 PM      MDM   Final diagnoses:  Alcoholism  History of depression    Pt here expressing thoughts and feelings of depression, stating he doesn't care anymore, however denying any specific suicidal ideation or attempt or plan. Patient states his medication for his depression is not working anymore, and he is seeking  help. Patient noted to have alcohol level 235, however during our conversation patient was lucid, and appropriate with me, indicating that he is clinically sober. Patient was placed on psych hold and consult to TTS. When in TCU, patient began yelling at staff with obscenities, becoming irate. It is clear the patient is in fact not clinically sober, patient will have to be held to sober up and then be assessed by psychiatry for lethality.   Signed,  Ladona Mow, PA-C 1:44 AM   Ladona Mow, PA-C 06/16/14 0144  Tilden Fossa, MD 06/16/14 [phone]

## 2014-06-15 NOTE — BH Assessment (Signed)
Tele Assessment Note   Hunter Mcmillan is a 64 y.o. male who presents to [facility] for psych eval.  Pt is intoxicated and stated to this writer that the police gave him a choice to go to jail or go to the hospital for an evaluation.  Pt told medical staff that he was severely depressed and is an alcoholic.  Pt says he's been that way most of his life and the doctors just want him to take his medications.  Pt denies SI/HI/AVH but repeatedly says "I don't care".  This Clinical research associate attempted interview, but pt was loud, incoherent and belligerent, called this Public relations account executive, nigger, nigger, nigger woman and told this Clinical research associate that to "get the fuck out of my room".  He called this bitch several times and was very loud disturbing the other patients in the hospital.  This has no recommendation at this time.  This Clinical research associate discussed interview with Dr. Madilyn Hook, PA and Zorita Pang, NP who decided to allow pt to remain in the hospital until he is sober and re-assess for lethality.         Axis I: Alcohol Abuse and Substance Induced Mood Disorder Axis II: Deferred Axis III:  Past Medical History  Diagnosis Date  . Depression   . Hepatitis C     a. s/p [name] ~ 2003 in [place], [place] => stopped after 6 mos due to neutropenia - viral load noted to be low at that time;  no follow up since;   b. abdominal u/s 11/13:  diffuse hepatic steatosis and/or hepatocellular disease    . ETOH abuse   . Tobacco abuse   . CAD (coronary artery disease) 11/13    a. 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 Hx of echocardiogram     a. Echo 01/06/12: Mild focal basal hypertrophy the septum, vigorous LV, EF 65-70%, Gr 2 diast dysfn, trivial AI, MAC, trivial MR, trivial TR, PASP 36  . Thrombocytopenia   . Non-STEMI (non-ST elevated  myocardial infarction)   . Hepatic steatosis   . Heart attack    Axis IV: other psychosocial or environmental problems, problems related to social environment and problems with primary support group Axis V: 51-60 moderate symptoms  Past Medical History:  Past Medical History  Diagnosis Date  . Depression   . Hepatitis C     a. s/p [name] ~ 2003 in [place], [place] => stopped after 6 mos due to neutropenia - viral load noted to be low at that time;  no follow up since;   b. abdominal u/s 11/13:  diffuse hepatic steatosis and/or hepatocellular disease    . ETOH abuse   . Tobacco abuse   . CAD (coronary artery disease) 11/13    a. 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 Hx of echocardiogram     a. Echo 01/06/12: Mild focal basal hypertrophy the septum, vigorous LV, EF 65-70%, Gr 2 diast dysfn, trivial AI, MAC, trivial MR, trivial TR, PASP 36  . Thrombocytopenia   . Non-STEMI (non-ST elevated myocardial infarction)   . Hepatic steatosis   .  Heart attack     Past Surgical History  Procedure Laterality Date  . Eye surgery    . Left heart catheterization with coronary angiogram N/A 01/05/2012    Procedure: LEFT HEART CATHETERIZATION WITH CORONARY ANGIOGRAM;  Surgeon: Laurey Morale, MD;  Location: [facility] CATH LAB;  Service: Cardiovascular;  Laterality: N/A;    Family History:  Family History  Problem Relation Age of Onset  . Heart attack Mother 28  . Diabetes Mother   . Diabetes Maternal Grandfather   . Heart disease Maternal Grandfather     Social History:  reports that he has been smoking Cigarettes.  He has been smoking about 0.10 packs per day. He has never used smokeless tobacco. He reports that he drinks alcohol. He reports that he uses illicit drugs (Cocaine).  Additional  Social History:  Alcohol / Drug Use Pain Medications: See MAR  Prescriptions: See MAR  Over the Counter: See MAR  History of alcohol / drug use?: Yes Negative Consequences of Use: Work / Programmer, multimedia, Copywriter, advertising relationships, Surveyor, quantity Withdrawal Symptoms: Other (Comment) (No current w/d sxs ) Substance #1 Name of Substance 1: Alcohol  1 - Age of First Use: Teens  1 - Amount (size/oz): Unk  1 - Frequency: Daily  1 - Duration: On-going  1 - Last Use / Amount: 06/15/14  CIWA: CIWA-Ar BP: 133/88 mmHg Pulse Rate: 107 Nausea and Vomiting: no nausea and no vomiting Tactile Disturbances: mild itching, pins and needles, burning or numbness Tremor: two Auditory Disturbances: not present Paroxysmal Sweats: no sweat visible Visual Disturbances: not present Anxiety: moderately anxious, or guarded, so anxiety is inferred Headache, Fullness in Head: none present Agitation: moderately fidgety and restless Orientation and Clouding of Sensorium: oriented and can do serial additions CIWA-Ar Total: 12 COWS:    PATIENT STRENGTHS: (choose at least two) NA  Allergies: No Known Allergies  Home Medications:  (Not in a hospital admission)  OB/GYN Status:  No LMP for male patient.  General Assessment Data Location of Assessment: [facility] ED Is this a Tele or Face-to-Face Assessment?: Tele Assessment Is this an Initial Assessment or a Re-assessment for this encounter?: Initial Assessment Living Arrangements: Alone Can pt return to current living arrangement?: Yes Admission Status: Voluntary Is patient capable of signing voluntary admission?: Yes Transfer from: Home Referral Source: Self/Family/Friend  Medical Screening Exam [facility] Walk-in ONLY) Medical Exam completed: No Reason for MSE not completed: Other: (None )  [facility] Crisis Care Plan Living Arrangements: Alone Name of Psychiatrist: None  Name of Therapist: None   Education Status Is patient currently in school?: No Current Grade: None   Highest grade of school patient has completed: None  Name of school: None  Contact person: None   Risk to self with the past 6 months Suicidal Ideation: No Suicidal Intent: No Is patient at risk for suicide?: No Suicidal Plan?: No Access to Means: No What has been your use of drugs/alcohol within the last 12 months?: Abusing: alcohol  Previous Attempts/Gestures: No How many times?: 0 Other Self Harm Risks: None  Triggers for Past Attempts: None known Intentional Self Injurious Behavior: None Family Suicide History: No Recent stressful life event(s): Other (Comment) (Chronic SA ) Persecutory voices/beliefs?: No Depression: No Depression Symptoms:  (None reported ) Substance abuse history and/or treatment for substance abuse?: Yes Suicide prevention information given to non-admitted patients: Not applicable  Risk to Others within the past 6 months Homicidal Ideation: No Thoughts of Harm to Others: No Current Homicidal Intent: No Current Homicidal Plan:  No Access to Homicidal Means: No Identified Victim: None  History of harm to others?: No Assessment of Violence: None Noted Violent Behavior Description: None  Does patient have access to weapons?: No Criminal Charges Pending?: No Does patient have a court date: No  Psychosis Hallucinations: None noted Delusions: None noted  Mental Status Report Appearance/Hygiene: Disheveled, In scrubs Eye Contact: Poor Motor Activity: Unremarkable Speech: Incoherent, Loud Level of Consciousness: Alert Mood: Other (Comment) (Appropriate ) Affect: Appropriate to circumstance Anxiety Level: None Thought Processes: Irrelevant Judgement: Impaired Orientation: Person, Place, Time, Situation Obsessive Compulsive Thoughts/Behaviors: None  Cognitive Functioning Concentration: Decreased Memory: Recent Intact, Remote Intact IQ: Average Insight: Poor Impulse Control: Poor Appetite: Good Weight Loss: 0 Weight Gain: 0 Sleep: No  Change Total Hours of Sleep: 7 Vegetative Symptoms: None  ADLScreening [facility] Assessment Services) Patient's cognitive ability adequate to safely complete daily activities?: Yes Patient able to express need for assistance with ADLs?: No Independently performs ADLs?: Yes (appropriate for developmental age)  Prior Inpatient Therapy Prior Inpatient Therapy: No Prior Therapy Dates: None  Prior Therapy Facilty/Provider(s): None  Reason for Treatment: None   Prior Outpatient Therapy Prior Outpatient Therapy: No Prior Therapy Dates: None  Prior Therapy Facilty/Provider(s): None  Reason for Treatment: None   ADL Screening (condition at time of admission) Patient's cognitive ability adequate to safely complete daily activities?: Yes Is the patient deaf or have difficulty hearing?: No Does the patient have difficulty seeing, even when wearing glasses/contacts?: No Does the patient have difficulty concentrating, remembering, or making decisions?: Yes Patient able to express need for assistance with ADLs?: No Does the patient have difficulty dressing or bathing?: No Independently performs ADLs?: Yes (appropriate for developmental age) Does the patient have difficulty walking or climbing stairs?: No Weakness of Legs: None Weakness of Arms/Hands: None  Home Assistive Devices/Equipment Home Assistive Devices/Equipment: None  Therapy Consults (therapy consults require a physician order) PT Evaluation Needed: No OT Evalulation Needed: No SLP Evaluation Needed: No Abuse/Neglect Assessment (Assessment to be complete while patient is alone) Physical Abuse: Denies Verbal Abuse: Denies Sexual Abuse: Denies Exploitation of patient/patient's resources: Denies Self-Neglect: Denies Values / Beliefs Cultural Requests During Hospitalization: None Spiritual Requests During Hospitalization: None Consults Spiritual Care Consult Needed: No Social Work Consult Needed: No Merchant navy officer (For  Healthcare) Does patient have an advance directive?: No Would patient like information on creating an advanced directive?: No - patient declined information    Additional Information 1:1 In Past 12 Months?: No CIRT Risk: No Elopement Risk: No Does patient have medical clearance?: Yes     Disposition:  Disposition Initial Assessment Completed for this Encounter: Yes Disposition of Patient: Inpatient treatment program, Referred to (Per Hulan Fess, NP, re-eval after sober for lethality ) Type of inpatient treatment program: Adult Patient referred to: Other (Comment) (Per Hulan Fess, NP re-eval after sober for lethality )  Murrell Redden 06/15/2014 11:39 PM

## 2014-06-15 NOTE — ED Notes (Signed)
TTS assessment with Terri being attempted at present.

## 2014-06-15 NOTE — Progress Notes (Signed)
CSW met with pt at bedside. There was no family present. CSW was notified by Nurse CM that pt may be homeless.  Patient was tearful at bedside. Patient stated " Im sick; I cant help myself. I need somebody to help me." CSW asked pt if he could elaborate. However, pt informed CSW that he needed a moment alone and that he did not want to talk at this time.  CSW asked pt if she could leave homless and food pantry resources at bedside. The pt accepted. CSW left the resources with pt.  Willette Brace [phone] ED CSW 06/15/2014 10:51 PM

## 2014-06-15 NOTE — Discharge Instructions (Signed)
Alcohol Use Disorder Alcohol use disorder is a mental disorder. It is not a one-time incident of heavy drinking. Alcohol use disorder is the excessive and uncontrollable use of alcohol over time that leads to problems with functioning in one or more areas of daily living. People with this disorder risk harming themselves and others when they drink to excess. Alcohol use disorder also can cause other mental disorders, such as mood and anxiety disorders, and serious physical problems. People with alcohol use disorder often misuse other drugs.  Alcohol use disorder is common and widespread. Some people with this disorder drink alcohol to cope with or escape from negative life events. Others drink to relieve chronic pain or symptoms of mental illness. People with a family history of alcohol use disorder are at higher risk of losing control and using alcohol to excess.  SYMPTOMS  Signs and symptoms of alcohol use disorder may include the following:  1. Consumption ofalcohol inlarger amounts or over a longer period of time than intended. 2. Multiple unsuccessful attempts to cutdown or control alcohol use.  3. A great deal of time spent obtaining alcohol, using alcohol, or recovering from the effects of alcohol (hangover). 4. A strong desire or urge to use alcohol (cravings).  5. Continued use of alcohol despite problems at work, school, or home because of alcohol use.  6. Continued use of alcohol despite problems in relationships because of alcohol use. 7. Continued use of alcohol in situations when it is physically hazardous, such as driving a car. 8. Continued use of alcohol despite awareness of a physical or psychological problem that is likely related to alcohol use. Physical problems related to alcohol use can involve the brain, heart, liver, stomach, and intestines. Psychological problems related to alcohol use include intoxication, depression, anxiety, psychosis, delirium, and dementia.  9. The  need for increased amounts of alcohol to achieve the same desired effect, or a decreased effect from the consumption of the same amount of alcohol (tolerance). 10. Withdrawal symptoms upon reducing or stopping alcohol use, or alcohol use to reduce or avoid withdrawal symptoms. Withdrawal symptoms include: 1. Racing heart. 2. Hand tremor. 3. Difficulty sleeping. 4. Nausea. 5. Vomiting. 6. Hallucinations. 7. Restlessness. 8. Seizures. DIAGNOSIS Alcohol use disorder is diagnosed through an assessment by your health care provider. Your health care provider may start by asking three or four questions to screen for excessive or problematic alcohol use. To confirm a diagnosis of alcohol use disorder, at least two symptoms must be present within a 26-month period. The severity of alcohol use disorder depends on the number of symptoms:  Mild--two or three.  Moderate--four or five.  Severe--six or more. Your health care provider may perform a physical exam or use results from lab tests to see if you have physical problems resulting from alcohol use. Your health care provider may refer you to a mental health professional for evaluation. TREATMENT  Some people with alcohol use disorder are able to reduce their alcohol use to low-risk levels. Some people with alcohol use disorder need to quit drinking alcohol. When necessary, mental health professionals with specialized training in substance use treatment can help. Your health care provider can help you decide how severe your alcohol use disorder is and what type of treatment you need. The following forms of treatment are available:   Detoxification. Detoxification involves the use of prescription medicines to prevent alcohol withdrawal symptoms in the first week after quitting. This is important for people with a history of symptoms  of withdrawal and for heavy drinkers who are likely to have withdrawal symptoms. Alcohol withdrawal can be dangerous and, in  severe cases, cause death. Detoxification is usually provided in a hospital or in-patient substance use treatment facility.  Counseling or talk therapy. Talk therapy is provided by substance use treatment counselors. It addresses the reasons people use alcohol and ways to keep them from drinking again. The goals of talk therapy are to help people with alcohol use disorder find healthy activities and ways to cope with life stress, to identify and avoid triggers for alcohol use, and to handle cravings, which can cause relapse.  Medicines.Different medicines can help treat alcohol use disorder through the following actions:  Decrease alcohol cravings.  Decrease the positive reward response felt from alcohol use.  Produce an uncomfortable physical reaction when alcohol is used (aversion therapy).  Support groups. Support groups are run by people who have quit drinking. They provide emotional support, advice, and guidance. These forms of treatment are often combined. Some people with alcohol use disorder benefit from intensive combination treatment provided by specialized substance use treatment centers. Both inpatient and outpatient treatment programs are available. Document Released: 03/27/2004 Document Revised: 07/04/2013 Document Reviewed: 05/27/2012 [facility] Patient Information 2015 [place], [place]. This information is not intended to replace advice given to you by your health care provider. Make sure you discuss any questions you have with your health care provider.  Depression Depression refers to feeling sad, low, down in the dumps, blue, gloomy, or empty. In general, there are two kinds of depression: 11. Normal sadness or normal grief. This kind of depression is one that we all feel from time to time after upsetting life experiences, such as the loss of a job or the ending of a relationship. This kind of depression is considered normal, is short lived, and resolves within a few days to 2  weeks. Depression experienced after the loss of a loved one (bereavement) often lasts longer than 2 weeks but normally gets better with time. 12. Clinical depression. This kind of depression lasts longer than normal sadness or normal grief or interferes with your ability to function at home, at work, and in school. It also interferes with your personal relationships. It affects almost every aspect of your life. Clinical depression is an illness. Symptoms of depression can also be caused by conditions other than those mentioned above, such as:  Physical illness. Some physical illnesses, including underactive thyroid gland (hypothyroidism), severe anemia, specific types of cancer, diabetes, uncontrolled seizures, heart and lung problems, strokes, and chronic pain are commonly associated with symptoms of depression.  Side effects of some prescription medicine. In some people, certain types of medicine can cause symptoms of depression.  Substance abuse. Abuse of alcohol and illicit drugs can cause symptoms of depression. SYMPTOMS Symptoms of normal sadness and normal grief include the following:  Feeling sad or crying for short periods of time.  Not caring about anything (apathy).  Difficulty sleeping or sleeping too much.  No longer able to enjoy the things you used to enjoy.  Desire to be by oneself all the time (social isolation).  Lack of energy or motivation.  Difficulty concentrating or remembering.  Change in appetite or weight.  Restlessness or agitation. Symptoms of clinical depression include the same symptoms of normal sadness or normal grief and also the following symptoms:  Feeling sad or crying all the time.  Feelings of guilt or worthlessness.  Feelings of hopelessness or helplessness.  Thoughts of suicide or  the desire to harm yourself (suicidal ideation).  Loss of touch with reality (psychotic symptoms). Seeing or hearing things that are not real (hallucinations)  or having false beliefs about your life or the people around you (delusions and paranoia). DIAGNOSIS  The diagnosis of clinical depression is usually based on how bad the symptoms are and how long they have lasted. Your health care provider will also ask you questions about your medical history and substance use to find out if physical illness, use of prescription medicine, or substance abuse is causing your depression. Your health care provider may also order blood tests. TREATMENT  Often, normal sadness and normal grief do not require treatment. However, sometimes antidepressant medicine is given for bereavement to ease the depressive symptoms until they resolve. The treatment for clinical depression depends on how bad the symptoms are but often includes antidepressant medicine, counseling with a mental health professional, or both. Your health care provider will help to determine what treatment is best for you. Depression caused by physical illness usually goes away with appropriate medical treatment of the illness. If prescription medicine is causing depression, talk with your health care provider about stopping the medicine, decreasing the dose, or changing to another medicine. Depression caused by the abuse of alcohol or illicit drugs goes away when you stop using these substances. Some adults need professional help in order to stop drinking or using drugs. SEEK IMMEDIATE MEDICAL CARE IF:  You have thoughts about hurting yourself or others.  You lose touch with reality (have psychotic symptoms).  You are taking medicine for depression and have a serious side effect. FOR MORE INFORMATION  National Alliance on Mental Illness: www.nami.[employer] of Mental Health: http://www.maynard.net/ Document Released: 02/15/2000 Document Revised: 07/04/2013 Document Reviewed: 05/19/2011 [facility] Patient Information 2015 [place], [place]. This information is not intended to replace advice given to  you by your health care provider. Make sure you discuss any questions you have with your health care provider.   Emergency Department Resource Guide 1) Find a Doctor and Pay Out of Pocket Although you won't have to find out who is covered by your insurance plan, it is a good idea to ask around and get recommendations. You will then need to call the office and see if the doctor you have chosen will accept you as a new patient and what types of options they offer for patients who are self-pay. Some doctors offer discounts or will set up payment plans for their patients who do not have insurance, but you will need to ask so you aren't surprised when you get to your appointment.  2) Contact Your Local Health Department Not all health departments have doctors that can see patients for sick visits, but many do, so it is worth a call to see if yours does. If you don't know where your local health department is, you can check in your phone book. The CDC also has a tool to help you locate your state's health department, and many state websites also have listings of all of their local health departments.  3) Find a Walk-in Clinic If your illness is not likely to be very severe or complicated, you may want to try a walk in clinic. These are popping up all over the country in pharmacies, drugstores, and shopping centers. They're usually staffed by nurse practitioners or physician assistants that have been trained to treat common illnesses and complaints. They're usually fairly quick and inexpensive. However, if you have serious medical issues  or chronic medical problems, these are probably not your best option.  No Primary Care Doctor: - Call Health Connect at  [phone] - they can help you locate a primary care doctor that  accepts your insurance, provides certain services, etc. - Physician Referral Service- [phone]  Chronic Pain Problems: Organization         Address  Phone   Notes  [facility]  [phone] Patients need to be referred by their primary care doctor.   Medication Assistance: Organization         Address  Phone   Notes  [facility] Medication [facility] [address] [place], [place] [address] [phone] --Must be a resident of [place] [facility] -- Must have NO insurance coverage whatsoever (no Medicaid/ Medicare, etc.) -- The pt. MUST have a primary care doctor that directs their care regularly and follows them in the community   MedAssist  [phone]   [employer]  [phone]    Agencies that provide inexpensive medical care: Organization         Address  Phone   Notes  [facility]  [phone]   [facility]    [phone]   [facility] [address] [place], [place] [address] [phone]   [facility] [address], [place] [phone]) [phone]   Planned Parenthood    [phone]   [facility]    [phone]   [facility]  [address], [place] Phone:  [phone], Fax:  [phone] Hours of Operation:  9 am - 6 pm, M-F.  Also accepts Medicaid/Medicare and self-pay.  [facility]  [address], [place] Phone: [phone]) [phone], Fax: [phone]) [phone]. Hours of Operation:  8:30 am - 5:30 pm, M-F.  Also accepts Medicaid and self-pay.  [facility] [place] [address], [place] Phone: [phone]   [facility] [address], [place], [place] [phone], Ext. [phone] Mondays & Thursdays: 7-9 AM.  First 15 patients are seen on a first come, first serve basis.    Medicaid-accepting [facility] Providers:  Organization         Address  Phone   Notes  [facility] [address], [place] [phone] Also accepts self-pay patients.  [facility] [address], [place]  [phone]   [facility] [address], [place] [phone]   [facility] [address] [place] [address], [place] [phone]   Renaye RakersVeita Bland [address], [place]   [phone] Only accepts [place] Access [place] patients after they have their name applied to their card.   Self-Pay (no insurance) in [facility] [place]:  Organization         Address  Phone   Notes  Sickle Cell Patients, [facility] [address], [place] [phone]) [phone]   [facility] [address], [place] [phone]   [facility] [place]  [address], [place] [phone]   [facility]/Dr. Osei-Bonsu  [address] [place] [address], [place] or [phone], [place] [phone] Phone number for both [place] and [place] locations is the same.  [facility] [address], [place] [phone]   [facility] [address] [place] or [address] [phone]   [facility] [address], [place] [phone], phone; [phone], fax Sees patients 1st and 3rd Saturday of every month.  Must not qualify for public or private insurance (i.e. Medicaid, Medicare, [place] Health Choice, Veterans' Benefits)  Household income should be no more than 200% of the poverty level The clinic cannot treat you if you are pregnant or think you are pregnant  Sexually transmitted diseases are not treated at the clinic.    Dental Care: Organization         Address  Phone  Notes  [facility] Department of [facility] [address] [place], [place] [phone] Accepts children up to age 69 who are enrolled in [place] or [place] Health Choice; pregnant women with a Medicaid card; and children who have applied for Medicaid  or [place] Health Choice, but were declined, whose parents can pay a reduced fee at time of service.  [facility]  [address], [place] [phone] Accepts children up to age 51 who are enrolled in [place] or [place] Health Choice; pregnant women with a Medicaid card; and children who have applied for Medicaid or [place] Health Choice, but were declined, whose parents can pay a reduced fee at time of service.  [facility]  [address] [place], [place] [phone] Patients are seen by appointment only. Walk-ins are not accepted. [facility] will see patients 60 years of age and older. Monday - Tuesday (8am-5pm) Most Wednesdays (8:30-5pm) $30 per visit, cash only  [facility] Adult [facility] Access [facility]  [address], [facility] [phone] Patients are seen by appointment only. Walk-ins are not accepted. [facility] will see patients 36 years of age and older. One Wednesday Evening (Monthly: Volunteer Based).  $30 per visit, cash only  [employer]  [phone] for adults; Children under age 9, call Graduate Pediatric Dentistry at [phone]. Children aged 19-14, please call [phone] to request a pediatric application.  Dental services are provided in all areas of dental care including fillings, crowns and bridges, complete and partial dentures, implants, gum treatment, root canals, and extractions. Preventive care is also provided. Treatment is provided to both adults and children. Patients are selected via a lottery and there is often a waiting list.   [facility] [address], [place]  [phone] [email or web address]   [facility] [address] [place], [place] [phone], Ext. [phone] Second and Fourth Thursday of each month, opens at 6:30 AM; Clinic ends at 9 AM.  Patients are seen on a first-come first-served basis, and a limited number are seen  during each clinic.   [facility]  [address] [place], [place] [phone]   Eligibility Requirements You must have lived in [place], [place], or [place] counties for at least the last three months.   You cannot be eligible for state or federal sponsored [place], including [employer], [place], or [employer].   You generally cannot be eligible for healthcare insurance through your employer.    How to apply: Eligibility screenings are held every Tuesday and Wednesday afternoon from 1:00 pm until 4:00 pm. You do not need an appointment for the interview!  [facility] [address], [place], [place] [phone]   [facility]  [phone]   [facility]  [phone]   [facility]  [phone]    Behavioral Health Resources in the Community: Intensive Outpatient Programs Organization         Address  Phone  Notes  [facility] [address], [place], [place] [phone]   [facility] [address], [place], [place] [phone]   [facility] [address], [place], [place]  [phone]   [facility] [address],  [place], [place] [phone] or [phone]   Substance Abuse Resources Organization         Address  Phone  Notes  [facility] Drug [facility]  [phone]   [facility]  [phone]   [facility]  [phone]   [facility]  [phone]   Residential & Outpatient Substance Abuse Program  [phone]   Psychological Services Organization         Address  Phone  Notes  [facility]  [phone]- [phone]   [facility]  [phone]- [phone]   [facility] [address], [place] [phone] or [phone]    Mobile Crisis Teams Organization         Address  Phone  Notes  [facility],  Mobile Crisis Care Unit  [phone]   [facility]  [address] [place], [place] [phone]   Doristine LocksSharon DeEsch [address] [place] [phone]    Self-Help/Support Groups Organization         Address  Phone             Notes  [facility] [place] - variety of support groups  [phone] Call for more information  Narcotics Anonymous (NA), [facility] [address], [place]  2 meetings at this location   Statisticianesidential Treatment Programs Organization         Address  Phone  Notes  [facility] [address],    [place]  [phone]   [facility]  [address], [place] [phone], [place], [place] [phone]   [facility] [address], [place] [phone] Admissions: 8am-3pm M-F  [facility] [address]    [place], [place] [phone]   [facility] [address], [place], [place] [phone]   [facility] [address]  [place], [place] [phone]   [facility] - Intensive Outpatient [address], [place], [place] [phone]   [facility] ([facility] [address]  [place], [place] [phone] or [phone]   [facility] ([facility]) [address] [place], [place] [phone] Accepts Medicaid  [facility] [address]  [place] [phone] Substance Abuse/Addiction Treatment   [facility]         Address  Phone  Notes  [facility]  [phone]) [phone]   Angie FavaJulie Brannon, PhD [address], [address] [place], [place]   [phone] or [phone]   [facility]   [address] [place], [place] [phone]) [phone]   [facility] [address], [place], [place] [phone] Insurance/Medicaid/sponsorship through [employer] [facility] [address]  [address]  [place], [place] [phone] [place], [place] [phone]    Dr. Adele Schilder  [phone]   [facility] Dept. 1) [address] [place] 2) [place] 3)  [address], [place] [phone]  [phone]   [place] [phone] or [phone] (After Hours)

## 2014-06-16 DIAGNOSIS — F1020 Alcohol dependence, uncomplicated: Secondary | ICD-10-CM | POA: Insufficient documentation

## 2014-06-16 DIAGNOSIS — Z8659 Personal history of other mental and behavioral disorders: Secondary | ICD-10-CM | POA: Insufficient documentation

## 2014-06-16 MED ORDER — CITALOPRAM HYDROBROMIDE 40 MG PO TABS
40.0000 mg | ORAL_TABLET | Freq: Every day | ORAL | Status: DC
  Administered 2014-06-16 – 2014-06-17 (×2): 40 mg via ORAL
  Filled 2014-06-16 (×2): qty 1

## 2014-06-16 NOTE — Consult Note (Signed)
[facility] Consult   Reason for Consult:  Alcohol use disorder, severe Referring Physician:  EDP Patient Identification: Hunter Mcmillan MRN:  [id number] Principal Diagnosis: Alcohol use disorder, severe, dependence Diagnosis:   Patient Active Problem List   Diagnosis Date Noted  . Alcohol use disorder, severe, dependence [F10.20] 06/16/2014    Priority: High  . Cocaine abuse [F14.10] 11/18/2013  . Scrotal mass [N50.8] 07/21/2013  . Cirrhosis [K74.60] 05/28/2013  . Routine general medical examination at a health care facility [Z00.00] 07/19/2012  . Abnormal TSH [R79.89] 04/09/2012  . Coronary atherosclerosis of native coronary artery [I25.10] 01/14/2012  . NSTEMI (non-ST elevated myocardial infarction) [I21.4] 01/05/2012  . Alcohol abuse [F10.10] 01/05/2012  . Tobacco abuse [Z72.0] 01/05/2012  . Hepatitis C [B19.20] 01/05/2012  . Depression [F32.9] 01/05/2012  . Thrombocytopenia [D69.6] 01/05/2012    Total Time spent with patient: 45 minutes  Subjective:   Hunter Mcmillan is a 64 y.o. male patient admitted with Alcohol use disorder, severe.  HPI: Caucasian male, 64 years old was evaluated for Alcoholism.  Patient reported that he is taking antidepressant but drinks Alcohol daily.  He reported that he has been drinking Alcohol for most of his life.  He reported drinking 2-3 12 OZ beer daily.  His Alcohol level on arrival last night was 235. Patient was intoxicated on arrival and was agitated.  Today patient reported that he drinks a lot of beer to feel good.  Patient stated he has nothing left  And that nobody cares about him.  Patient reports that he uses Alcohol for sleep and to treat his depression because his Celexa 20 mg is not enough.  He reports good sleep and appetite after drinking.  He denies Alcohol withdrawal seizures..  He denies SI/HI/AVH.   We will observe overnight and re-evaluate in am for appropriate disposition.  We have increased his Celexa to 40 mg  po daily.  HPI Elements:   Location:  Alcohol use disorder, severe. Quality:  severe. Severity:  severe. Timing:  Acute. Duration:  Chronic Alcohol use. Context:  Seeking treatment for Alcoholism.  Past Medical History:  Past Medical History  Diagnosis Date  . Depression   . Hepatitis C     a. s/p [name] ~ 2003 in [place], [place] => stopped after 6 mos due to neutropenia - viral load noted to be low at that time;  no follow up since;   b. abdominal u/s 11/13:  diffuse hepatic steatosis and/or hepatocellular disease    . ETOH abuse   . Tobacco abuse   . CAD (coronary artery disease) 11/13    a. 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 Hx of echocardiogram     a. Echo 01/06/12: Mild focal basal hypertrophy the septum, vigorous LV, EF 65-70%, Gr 2 diast dysfn, trivial AI, MAC, trivial MR, trivial TR, PASP 36  . Thrombocytopenia   . Non-STEMI (non-ST elevated myocardial infarction)   . Hepatic steatosis   . Heart attack     Past Surgical History  Procedure Laterality Date  . Eye surgery    . Left heart catheterization with coronary angiogram N/A 01/05/2012    Procedure: LEFT HEART CATHETERIZATION WITH CORONARY ANGIOGRAM;  Surgeon: Larey Dresser, MD;  Location: [facility] CATH LAB;  Service: Cardiovascular;  Laterality: N/A;  Family History:  Family History  Problem Relation Age of Onset  . Heart attack Mother 38  . Diabetes Mother   . Diabetes Maternal Grandfather   . Heart disease Maternal Grandfather    Social History:  History  Alcohol Use  . Yes    Comment: 2-3 beers on weekends, previous 6-8 beers daily. Patient stopped drinking 5 months prior 05/27/13     History  Drug Use  . Yes  . Special: Cocaine    History   Social History  . Marital Status: Single    Spouse  Name: N/A  . Number of Children: 1  . Years of Education: N/A   Occupational History  . Dealer     retired   Social History Main Topics  . Smoking status: Light Tobacco Smoker -- 0.10 packs/day    Types: Cigarettes  . Smokeless tobacco: Never Used     Comment: almost quit, very rare, 1-2 daily  . Alcohol Use: Yes     Comment: 2-3 beers on weekends, previous 6-8 beers daily. Patient stopped drinking 5 months prior 05/27/13  . Drug Use: Yes    Special: Cocaine  . Sexual Activity: Not on file   Other Topics Concern  . None   Social History Narrative   Additional Social History:    Pain Medications: See MAR  Prescriptions: See MAR  Over the Counter: See MAR  History of alcohol / drug use?: Yes Negative Consequences of Use: Work / Youth worker, Charity fundraiser relationships, Museum/gallery curator Withdrawal Symptoms: Other (Comment) (No current w/d sxs ) Name of Substance 1: Alcohol  1 - Age of First Use: Teens  1 - Amount (size/oz): Unk  1 - Frequency: Daily  1 - Duration: On-going  1 - Last Use / Amount: 06/15/14                   Allergies:  No Known Allergies  Labs:  Results for orders placed or performed during the hospital encounter of 06/15/14 (from the past 48 hour(s))  Drug screen panel, emergency     Status: None   Collection Time: 06/15/14 10:10 PM  Result Value Ref Range   Opiates NONE DETECTED NONE DETECTED   Cocaine NONE DETECTED NONE DETECTED   Benzodiazepines NONE DETECTED NONE DETECTED   Amphetamines NONE DETECTED NONE DETECTED   Tetrahydrocannabinol NONE DETECTED NONE DETECTED   Barbiturates NONE DETECTED NONE DETECTED    Comment:        DRUG SCREEN FOR MEDICAL PURPOSES ONLY.  IF CONFIRMATION IS NEEDED FOR ANY PURPOSE, NOTIFY LAB WITHIN 5 DAYS.        LOWEST DETECTABLE LIMITS FOR URINE DRUG SCREEN Drug Class       Cutoff (ng/mL) Amphetamine      1000 Barbiturate      200 Benzodiazepine   026 Tricyclics       378 Opiates          300 Cocaine           300 THC              50   CBC WITH DIFFERENTIAL     Status: Abnormal   Collection Time: 06/15/14 10:45 PM  Result Value Ref Range   WBC 6.7 4.0 - 10.5 K/uL   RBC 4.44 4.22 - 5.81 MIL/uL   Hemoglobin 15.7 13.0 - 17.0 g/dL   HCT 44.7 39.0 - 52.0 %   MCV 100.7 (H) 78.0 - 100.0 fL   MCH 35.4 (H) 26.0 -  34.0 pg   MCHC 35.1 30.0 - 36.0 g/dL   RDW 14.2 11.5 - 15.5 %   Platelets 100 (L) 150 - 400 K/uL    Comment: REPEATED TO VERIFY SPECIMEN CHECKED FOR CLOTS PLATELET COUNT CONFIRMED BY SMEAR    Neutrophils Relative % 32 (L) 43 - 77 %   Neutro Abs 2.1 1.7 - 7.7 K/uL   Lymphocytes Relative 56 (H) 12 - 46 %   Lymphs Abs 3.8 0.7 - 4.0 K/uL   Monocytes Relative 9 3 - 12 %   Monocytes Absolute 0.6 0.1 - 1.0 K/uL   Eosinophils Relative 2 0 - 5 %   Eosinophils Absolute 0.1 0.0 - 0.7 K/uL   Basophils Relative 0 0 - 1 %   Basophils Absolute 0.0 0.0 - 0.1 K/uL  Comprehensive metabolic panel     Status: Abnormal   Collection Time: 06/15/14 10:45 PM  Result Value Ref Range   Sodium 142 135 - 145 mmol/L   Potassium 4.0 3.5 - 5.1 mmol/L   Chloride 108 96 - 112 mmol/L   CO2 25 19 - 32 mmol/L   Glucose, Bld 125 (H) 70 - 99 mg/dL   BUN 16 6 - 23 mg/dL   Creatinine, Ser 1.43 (H) 0.50 - 1.35 mg/dL   Calcium 8.7 8.4 - 10.5 mg/dL   Total Protein 7.7 6.0 - 8.3 g/dL   Albumin 3.7 3.5 - 5.2 g/dL   AST 130 (H) 0 - 37 U/L   ALT 124 (H) 0 - 53 U/L   Alkaline Phosphatase 155 (H) 39 - 117 U/L   Total Bilirubin 0.7 0.3 - 1.2 mg/dL   GFR calc non Af Amer 50 (L) >90 mL/min   GFR calc Af Amer 58 (L) >90 mL/min    Comment: (NOTE) The eGFR has been calculated using the CKD EPI equation. This calculation has not been validated in all clinical situations. eGFR's persistently <90 mL/min signify possible Chronic Kidney Disease.    Anion gap 9 5 - 15  Ethanol     Status: Abnormal   Collection Time: 06/15/14 10:45 PM  Result Value Ref Range   Alcohol, Ethyl (B) 235 (H) 0 - 9 mg/dL    Comment:        LOWEST  DETECTABLE LIMIT FOR SERUM ALCOHOL IS 11 mg/dL FOR MEDICAL PURPOSES ONLY     Vitals: Blood pressure 151/80, pulse 64, temperature 98.1 F (36.7 C), temperature source Oral, resp. rate 16, SpO2 95 %.  Risk to Self: Suicidal Ideation: No Suicidal Intent: No Is patient at risk for suicide?: No Suicidal Plan?: No Access to Means: No What has been your use of drugs/alcohol within the last 12 months?: Abusing: alcohol  How many times?: 0 Other Self Harm Risks: None  Triggers for Past Attempts: None known Intentional Self Injurious Behavior: None Risk to Others: Homicidal Ideation: No Thoughts of Harm to Others: No Current Homicidal Intent: No Current Homicidal Plan: No Access to Homicidal Means: No Identified Victim: None  History of harm to others?: No Assessment of Violence: None Noted Violent Behavior Description: None  Does patient have access to weapons?: No Criminal Charges Pending?: No Does patient have a court date: No Prior Inpatient Therapy: Prior Inpatient Therapy: No Prior Therapy Dates: None  Prior Therapy Facilty/Provider(s): None  Reason for Treatment: None  Prior Outpatient Therapy: Prior Outpatient Therapy: No Prior Therapy Dates: None  Prior Therapy Facilty/Provider(s): None  Reason for Treatment: None   Current Facility-Administered Medications  Medication Dose Route  Frequency Provider Last Rate Last Dose  . acetaminophen (TYLENOL) tablet 650 mg  650 mg Oral Q4H PRN Dahlia Bailiff, PA-C      . albuterol (PROVENTIL) (2.5 MG/3ML) 0.083% nebulizer solution 2.5 mg  2.5 mg Nebulization Once Shawnee Knapp, MD      . alum & mag hydroxide-simeth (MAALOX/MYLANTA) 200-200-20 MG/5ML suspension 30 mL  30 mL Oral PRN Dahlia Bailiff, PA-C      . citalopram (CELEXA) tablet 40 mg  40 mg Oral Daily Danyah Guastella   40 mg at 06/16/14 1424  . ibuprofen (ADVIL,MOTRIN) tablet 600 mg  600 mg Oral Q8H PRN Dahlia Bailiff, PA-C      . ipratropium (ATROVENT) nebulizer solution 0.5 mg  0.5 mg  Nebulization Once Shawnee Knapp, MD      . LORazepam (ATIVAN) tablet 0-4 mg  0-4 mg Oral 4 times per day Dahlia Bailiff, PA-C   0 mg at 06/15/14 2339   Followed by  . [START ON 06/18/2014] LORazepam (ATIVAN) tablet 0-4 mg  0-4 mg Oral Q12H Joe Mintz, PA-C      . nicotine (NICODERM CQ - dosed in mg/24 hours) patch 21 mg  21 mg Transdermal Daily Dahlia Bailiff, PA-C   21 mg at 06/16/14 1009  . ondansetron (ZOFRAN) tablet 4 mg  4 mg Oral Q8H PRN Dahlia Bailiff, PA-C      . thiamine (VITAMIN B-1) tablet 100 mg  100 mg Oral Daily Dahlia Bailiff, PA-C   100 mg at 06/16/14 1132   Or  . thiamine (B-1) injection 100 mg  100 mg Intravenous Daily Dahlia Bailiff, PA-C      . zolpidem (AMBIEN) tablet 5 mg  5 mg Oral QHS PRN Dahlia Bailiff, PA-C       Current Outpatient Prescriptions  Medication Sig Dispense Refill  . aspirin EC 81 MG tablet Take 81-162 mg by mouth daily.     . Ipratropium-Albuterol (COMBIVENT) 20-100 MCG/ACT AERS respimat Inhale 1 puff into the lungs every 6 (six) hours. 4 g 1  . Multiple Vitamin (MULTIVITAMIN) tablet Take 1 tablet by mouth daily.    . nitroGLYCERIN (NITROSTAT) 0.4 MG SL tablet Place 0.4 mg under the tongue every 5 (five) minutes as needed for chest pain.    Marland Kitchen azithromycin (ZITHROMAX) 250 MG tablet Take 2 tabs PO x 1 dose, then 1 tab PO QD x 4 days (Patient not taking: Reported on 06/15/2014) 6 tablet 0  . benzonatate (TESSALON) 200 MG capsule Take 1 capsule (200 mg total) by mouth 3 (three) times daily as needed for cough. (Patient not taking: Reported on 06/15/2014) 30 capsule 0  . citalopram (CELEXA) 40 MG tablet Take 1 tablet (40 mg total) by mouth daily. (Patient not taking: Reported on 06/15/2014) 30 tablet 6  . predniSONE (DELTASONE) 20 MG tablet Take 2 tablets (40 mg total) by mouth daily with breakfast. (Patient not taking: Reported on 06/15/2014) 10 tablet 0    Musculoskeletal: Strength & Muscle Tone: within normal limits Gait & Station: normal Patient leans: N/A  Psychiatric Specialty Exam:      Blood pressure 151/80, pulse 64, temperature 98.1 F (36.7 C), temperature source Oral, resp. rate 16, SpO2 95 %.There is no weight on file to calculate BMI.  General Appearance: Casual and Disheveled  Eye Contact::  Minimal  Speech:  Clear and Coherent and Normal Rate  Volume:  Normal  Mood:  Anxious and Depressed  Affect:  Congruent and Depressed  Thought Process:  Coherent, Goal Directed and  Intact  Orientation:  Full (Time, Place, and Person)  Thought Content:  WDL  Suicidal Thoughts:  No  Homicidal Thoughts:  No  Memory:  Immediate;   Good Recent;   Good Remote;   Good  Judgement:  Impaired  Insight:  Shallow  Psychomotor Activity:  Tremor  Concentration:  Fair  Recall:  Good  Fund of Knowledge:Fair  Language: Good  Akathisia:  NA  Handed:  Right  AIMS (if indicated):     Assets:  Desire for Improvement  ADL's:  Impaired  Cognition: WNL  Sleep:      Medical Decision Making: Established Problem, Worsening (2)  Treatment Plan Summary: Daily contact with patient to assess and evaluate symptoms and progress in treatment, Medication management and Plan Observe overnight  Plan:  Will re-evaluate in am Disposition: Re-evaluate  in am  Delfin Gant   PMHNP-BC 06/16/2014 6:26 PM Patient seen face-to-face for psychiatric evaluation, chart reviewed and case discussed with the physician extender and developed treatment plan. Reviewed the information documented and agree with the treatment plan. Corena Pilgrim, MD

## 2014-06-16 NOTE — ED Notes (Signed)
Pt AAO x 3, no distress noted, watching TV at present, calm & cooperative at present,  Monitoring for safety, Q 15 min checks in effect.

## 2014-06-17 DIAGNOSIS — F1020 Alcohol dependence, uncomplicated: Secondary | ICD-10-CM | POA: Diagnosis not present

## 2014-06-17 MED ORDER — CITALOPRAM HYDROBROMIDE 40 MG PO TABS: 40.0000 mg | ORAL_TABLET | Freq: Every day | ORAL | Status: DC

## 2014-06-17 NOTE — Consult Note (Signed)
[place] Consult   Reason for Consult:  Alcohol use disorder, severe Referring Physician:  EDP Patient Identification: Hunter Mcmillan MRN:  [id number] Principal Diagnosis: Alcohol use disorder, severe, dependence Diagnosis:   Patient Active Problem List   Diagnosis Date Noted  . Alcohol use disorder, severe, dependence [F10.20] 06/16/2014    Priority: High  . Alcoholism [F10.20]   . History of depression [Z86.59]   . Cocaine abuse [F14.10] 11/18/2013  . Scrotal mass [N50.8] 07/21/2013  . Cirrhosis [K74.60] 05/28/2013  . Routine general medical examination at a health care facility [Z00.00] 07/19/2012  . Abnormal TSH [R79.89] 04/09/2012  . Coronary atherosclerosis of native coronary artery [I25.10] 01/14/2012  . NSTEMI (non-ST elevated myocardial infarction) [I21.4] 01/05/2012  . Alcohol abuse [F10.10] 01/05/2012  . Tobacco abuse [Z72.0] 01/05/2012  . Hepatitis C [B19.20] 01/05/2012  . Depression [F32.9] 01/05/2012  . Thrombocytopenia [D69.6] 01/05/2012    Total Time spent with patient: 30 minutes  Subjective:   Hunter Mcmillan is a 64 y.o. male patient admitted with Alcohol use disorder, severe.  HPI: Caucasian male, 64 years old was evaluated for Alcoholism.  Patient reported that he is taking antidepressant but drinks Alcohol daily.  He reported that he has been drinking Alcohol for most of his life.  He reported drinking 2-3 12 OZ beer daily.  His Alcohol level on arrival last night was 235. Patient was intoxicated on arrival and was agitated.  Today patient reported that he drinks a lot of beer to feel good.  Patient stated he has nothing left  And that nobody cares about him.  Patient reports that he uses Alcohol for sleep and to treat his depression because his Celexa 20 mg is not enough.  He reports good sleep and appetite after drinking.  He denies Alcohol withdrawal seizures..  He denies SI/HI/AVH.   We will observe overnight and re-evaluate in am for  appropriate disposition.  We have increased his Celexa to 40 mg po daily.   06/17/14:   Patient was seen in his room today awake, alert and oriented x3.  He is not interested in rehabilitation facility.  He will be referred back to [facility] for his [place] care.   He denies SI/HI/AVH.  Patient stated that he knows that he must stop drinking Alcohol but stated that the environment he is in makes it difficult for him to stop drinking because everybody drinks daily there.  Patient denies withdrawal symptoms.  He is discharaged home.  HPI Elements:   Location:  Alcohol use disorder, severe. Quality:  severe. Severity:  severe. Timing:  Acute. Duration:  Chronic Alcohol use. Context:  Seeking treatment for Alcoholism.  Past Medical History:  Past Medical History  Diagnosis Date  . Depression   . Hepatitis C     a. s/p [name] ~ 2003 in [place], [place] => stopped after 6 mos due to neutropenia - viral load noted to be low at that time;  no follow up since;   b. abdominal u/s 11/13:  diffuse hepatic steatosis and/or hepatocellular disease    . ETOH abuse   . Tobacco abuse   . CAD (coronary artery disease) 11/13    a. 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 Hx of echocardiogram     a. Echo 01/06/12: Mild  focal basal hypertrophy the septum, vigorous LV, EF 65-70%, Gr 2 diast dysfn, trivial AI, MAC, trivial MR, trivial TR, PASP 36  . Thrombocytopenia   . Non-STEMI (non-ST elevated myocardial infarction)   . Hepatic steatosis   . Heart attack     Past Surgical History  Procedure Laterality Date  . Eye surgery    . Left heart catheterization with coronary angiogram N/A 01/05/2012    Procedure: LEFT HEART CATHETERIZATION WITH CORONARY ANGIOGRAM;  Surgeon: Larey Dresser, MD;  Location[facility] CATH LAB;   Service: Cardiovascular;  Laterality: N/A;   Family History:  Family History  Problem Relation Age of Onset  . Heart attack Mother 38  . Diabetes Mother   . Diabetes Maternal Grandfather   . Heart disease Maternal Grandfather    Social History:  History  Alcohol Use  . Yes    Comment: 2-3 beers on weekends, previous 6-8 beers daily. Patient stopped drinking 5 months prior 05/27/13     History  Drug Use  . Yes  . Special: Cocaine    History   Social History  . Marital Status: Single    Spouse Name: N/A  . Number of Children: 1  . Years of Education: N/A   Occupational History  . Dealer     retired   Social History Main Topics  . Smoking status: Light Tobacco Smoker -- 0.10 packs/day    Types: Cigarettes  . Smokeless tobacco: Never Used     Comment: almost quit, very rare, 1-2 daily  . Alcohol Use: Yes     Comment: 2-3 beers on weekends, previous 6-8 beers daily. Patient stopped drinking 5 months prior 05/27/13  . Drug Use: Yes    Special: Cocaine  . Sexual Activity: Not on file   Other Topics Concern  . None   Social History Narrative   Additional Social History:    Pain Medications: See MAR  Prescriptions: See MAR  Over the Counter: See MAR  History of alcohol / drug use?: Yes Negative Consequences of Use: Work / Youth worker, Charity fundraiser relationships, Museum/gallery curator Withdrawal Symptoms: Other (Comment) (No current w/d sxs ) Name of Substance 1: Alcohol  1 - Age of First Use: Teens  1 - Amount (size/oz): Unk  1 - Frequency: Daily  1 - Duration: On-going  1 - Last Use / Amount: 06/15/14                   Allergies:  No Known Allergies  Labs:  Results for orders placed or performed during the hospital encounter of 06/15/14 (from the past 48 hour(s))  Drug screen panel, emergency     Status: None   Collection Time: 06/15/14 10:10 PM  Result Value Ref Range   Opiates NONE DETECTED NONE DETECTED   Cocaine NONE DETECTED NONE DETECTED   Benzodiazepines  NONE DETECTED NONE DETECTED   Amphetamines NONE DETECTED NONE DETECTED   Tetrahydrocannabinol NONE DETECTED NONE DETECTED   Barbiturates NONE DETECTED NONE DETECTED    Comment:        DRUG SCREEN FOR MEDICAL PURPOSES ONLY.  IF CONFIRMATION IS NEEDED FOR ANY PURPOSE, NOTIFY LAB WITHIN 5 DAYS.        LOWEST DETECTABLE LIMITS FOR URINE DRUG SCREEN Drug Class       Cutoff (ng/mL) Amphetamine      1000 Barbiturate      200 Benzodiazepine   329 Tricyclics       924 Opiates  300 Cocaine          300 THC              50   CBC WITH DIFFERENTIAL     Status: Abnormal   Collection Time: 06/15/14 10:45 PM  Result Value Ref Range   WBC 6.7 4.0 - 10.5 K/uL   RBC 4.44 4.22 - 5.81 MIL/uL   Hemoglobin 15.7 13.0 - 17.0 g/dL   HCT 44.7 39.0 - 52.0 %   MCV 100.7 (H) 78.0 - 100.0 fL   MCH 35.4 (H) 26.0 - 34.0 pg   MCHC 35.1 30.0 - 36.0 g/dL   RDW 14.2 11.5 - 15.5 %   Platelets 100 (L) 150 - 400 K/uL    Comment: REPEATED TO VERIFY SPECIMEN CHECKED FOR CLOTS PLATELET COUNT CONFIRMED BY SMEAR    Neutrophils Relative % 32 (L) 43 - 77 %   Neutro Abs 2.1 1.7 - 7.7 K/uL   Lymphocytes Relative 56 (H) 12 - 46 %   Lymphs Abs 3.8 0.7 - 4.0 K/uL   Monocytes Relative 9 3 - 12 %   Monocytes Absolute 0.6 0.1 - 1.0 K/uL   Eosinophils Relative 2 0 - 5 %   Eosinophils Absolute 0.1 0.0 - 0.7 K/uL   Basophils Relative 0 0 - 1 %   Basophils Absolute 0.0 0.0 - 0.1 K/uL  Comprehensive metabolic panel     Status: Abnormal   Collection Time: 06/15/14 10:45 PM  Result Value Ref Range   Sodium 142 135 - 145 mmol/L   Potassium 4.0 3.5 - 5.1 mmol/L   Chloride 108 96 - 112 mmol/L   CO2 25 19 - 32 mmol/L   Glucose, Bld 125 (H) 70 - 99 mg/dL   BUN 16 6 - 23 mg/dL   Creatinine, Ser 1.43 (H) 0.50 - 1.35 mg/dL   Calcium 8.7 8.4 - 10.5 mg/dL   Total Protein 7.7 6.0 - 8.3 g/dL   Albumin 3.7 3.5 - 5.2 g/dL   AST 130 (H) 0 - 37 U/L   ALT 124 (H) 0 - 53 U/L   Alkaline Phosphatase 155 (H) 39 - 117 U/L    Total Bilirubin 0.7 0.3 - 1.2 mg/dL   GFR calc non Af Amer 50 (L) >90 mL/min   GFR calc Af Amer 58 (L) >90 mL/min    Comment: (NOTE) The eGFR has been calculated using the CKD EPI equation. This calculation has not been validated in all clinical situations. eGFR's persistently <90 mL/min signify possible Chronic Kidney Disease.    Anion gap 9 5 - 15  Ethanol     Status: Abnormal   Collection Time: 06/15/14 10:45 PM  Result Value Ref Range   Alcohol, Ethyl (B) 235 (H) 0 - 9 mg/dL    Comment:        LOWEST DETECTABLE LIMIT FOR SERUM ALCOHOL IS 11 mg/dL FOR MEDICAL PURPOSES ONLY     Vitals: Blood pressure 138/68, pulse 59, temperature 98 F (36.7 C), temperature source Oral, resp. rate 16, SpO2 97 %.  Risk to Self: Suicidal Ideation: No Suicidal Intent: No Is patient at risk for suicide?: No Suicidal Plan?: No Access to Means: No What has been your use of drugs/alcohol within the last 12 months?: Abusing: alcohol  How many times?: 0 Other Self Harm Risks: None  Triggers for Past Attempts: None known Intentional Self Injurious Behavior: None Risk to Others: Homicidal Ideation: No Thoughts of Harm to Others: No Current Homicidal Intent: No Current  Homicidal Plan: No Access to Homicidal Means: No Identified Victim: None  History of harm to others?: No Assessment of Violence: None Noted Violent Behavior Description: None  Does patient have access to weapons?: No Criminal Charges Pending?: No Does patient have a court date: No Prior Inpatient Therapy: Prior Inpatient Therapy: No Prior Therapy Dates: None  Prior Therapy Facilty/Provider(s): None  Reason for Treatment: None  Prior Outpatient Therapy: Prior Outpatient Therapy: No Prior Therapy Dates: None  Prior Therapy Facilty/Provider(s): None  Reason for Treatment: None   Current Facility-Administered Medications  Medication Dose Route Frequency Provider Last Rate Last Dose  . acetaminophen (TYLENOL) tablet 650 mg   650 mg Oral Q4H PRN Dahlia Bailiff, PA-C      . albuterol (PROVENTIL) (2.5 MG/3ML) 0.083% nebulizer solution 2.5 mg  2.5 mg Nebulization Once Shawnee Knapp, MD      . alum & mag hydroxide-simeth (MAALOX/MYLANTA) 200-200-20 MG/5ML suspension 30 mL  30 mL Oral PRN Dahlia Bailiff, PA-C      . citalopram (CELEXA) tablet 40 mg  40 mg Oral Daily Mojeed Akintayo   40 mg at 06/17/14 1114  . ibuprofen (ADVIL,MOTRIN) tablet 600 mg  600 mg Oral Q8H PRN Dahlia Bailiff, PA-C      . ipratropium (ATROVENT) nebulizer solution 0.5 mg  0.5 mg Nebulization Once Shawnee Knapp, MD      . LORazepam (ATIVAN) tablet 0-4 mg  0-4 mg Oral 4 times per day Dahlia Bailiff, PA-C   1 mg at 06/17/14 1113   Followed by  . [START ON 06/18/2014] LORazepam (ATIVAN) tablet 0-4 mg  0-4 mg Oral Q12H Joe Mintz, PA-C      . nicotine (NICODERM CQ - dosed in mg/24 hours) patch 21 mg  21 mg Transdermal Daily Dahlia Bailiff, PA-C   Stopped at 06/17/14 1109  . ondansetron (ZOFRAN) tablet 4 mg  4 mg Oral Q8H PRN Dahlia Bailiff, PA-C      . thiamine (VITAMIN B-1) tablet 100 mg  100 mg Oral Daily Dahlia Bailiff, PA-C   100 mg at 06/17/14 1113   Or  . thiamine (B-1) injection 100 mg  100 mg Intravenous Daily Dahlia Bailiff, PA-C      . zolpidem (AMBIEN) tablet 5 mg  5 mg Oral QHS PRN Dahlia Bailiff, PA-C       Current Outpatient Prescriptions  Medication Sig Dispense Refill  . aspirin EC 81 MG tablet Take 81-162 mg by mouth daily.     . Ipratropium-Albuterol (COMBIVENT) 20-100 MCG/ACT AERS respimat Inhale 1 puff into the lungs every 6 (six) hours. 4 g 1  . Multiple Vitamin (MULTIVITAMIN) tablet Take 1 tablet by mouth daily.    . nitroGLYCERIN (NITROSTAT) 0.4 MG SL tablet Place 0.4 mg under the tongue every 5 (five) minutes as needed for chest pain.    Marland Kitchen azithromycin (ZITHROMAX) 250 MG tablet Take 2 tabs PO x 1 dose, then 1 tab PO QD x 4 days (Patient not taking: Reported on 06/15/2014) 6 tablet 0  . benzonatate (TESSALON) 200 MG capsule Take 1 capsule (200 mg total) by mouth 3 (three) times  daily as needed for cough. (Patient not taking: Reported on 06/15/2014) 30 capsule 0  . citalopram (CELEXA) 40 MG tablet Take 1 tablet (40 mg total) by mouth daily. (Patient not taking: Reported on 06/15/2014) 30 tablet 6  . predniSONE (DELTASONE) 20 MG tablet Take 2 tablets (40 mg total) by mouth daily with breakfast. (Patient not taking: Reported on 06/15/2014) 10 tablet 0  Musculoskeletal: Strength & Muscle Tone: within normal limits Gait & Station: normal Patient leans: N/A  Psychiatric Specialty Exam:     Blood pressure 138/68, pulse 59, temperature 98 F (36.7 C), temperature source Oral, resp. rate 16, SpO2 97 %.There is no weight on file to calculate BMI.  General Appearance: Casual and Disheveled  Eye Contact::  Minimal  Speech:  Clear and Coherent and Normal Rate  Volume:  Normal  Mood:  Anxious and Depressed  Affect:  Congruent and Depressed  Thought Process:  Coherent, Goal Directed and Intact  Orientation:  Full (Time, Place, and Person)  Thought Content:  WDL  Suicidal Thoughts:  No  Homicidal Thoughts:  No  Memory:  Immediate;   Good Recent;   Good Remote;   Good  Judgement:  Good  Insight:  Shallow  Psychomotor Activity:  Normal  Concentration:  Fair  Recall:  Good  Fund of Knowledge:Good  Language: Good  Akathisia:  NA  Handed:  Right  AIMS (if indicated):     Assets:  Desire for Improvement  ADL's:  Impaired  Cognition: WNL  Sleep:      Medical Decision Making: Established Problem, Stable/Improving (1)  Treatment Plan Summary: discharge home  Plan:  Discharge home Disposition: dischrge home  Delfin Gant   PMHNP-BC 06/17/2014 2:01 PM  Patient seen and I agree with assessment and plan  Levonne Spiller M.D.

## 2014-06-17 NOTE — [facility] Counselor (Signed)
Gave pt follow up information for [facility] and number to [employer] in case of emergency.   Kateri Plummer, M.S., LPCA, Portal, [facility] Licensed Professional Counselor Associate  Triage Specialist  [facility]  Therapeutic Triage Services Phone: [phone] Fax: [phone]

## 2014-06-19 ENCOUNTER — Telehealth: Payer: Self-pay | Admitting: *Deleted

## 2014-06-19 NOTE — Telephone Encounter (Signed)
Patient in ED and went to Transitional Care Unit for alcoholism and depression.  No follow-up specified, would you like for him to follow-up here?

## 2014-06-19 NOTE — Telephone Encounter (Signed)
Pt is still in transitional care at behavioral health.  No discharge date advised.

## 2014-06-26 ENCOUNTER — Encounter: Payer: Self-pay | Admitting: Family Medicine

## 2014-06-26 ENCOUNTER — Ambulatory Visit (INDEPENDENT_AMBULATORY_CARE_PROVIDER_SITE_OTHER): Payer: Medicare Other | Admitting: Family Medicine

## 2014-06-26 VITALS — BP 140/86 | HR 71 | Temp 98.1°F | Resp 16 | Wt 166.0 lb

## 2014-06-26 DIAGNOSIS — F329 Major depressive disorder, single episode, unspecified: Secondary | ICD-10-CM

## 2014-06-26 DIAGNOSIS — J449 Chronic obstructive pulmonary disease, unspecified: Secondary | ICD-10-CM | POA: Diagnosis not present

## 2014-06-26 DIAGNOSIS — I2510 Atherosclerotic heart disease of native coronary artery without angina pectoris: Secondary | ICD-10-CM | POA: Diagnosis not present

## 2014-06-26 DIAGNOSIS — F32A Depression, unspecified: Secondary | ICD-10-CM

## 2014-06-26 DIAGNOSIS — M533 Sacrococcygeal disorders, not elsewhere classified: Secondary | ICD-10-CM | POA: Diagnosis not present

## 2014-06-26 DIAGNOSIS — F1010 Alcohol abuse, uncomplicated: Secondary | ICD-10-CM | POA: Diagnosis not present

## 2014-06-26 LAB — LIPID PANEL
Cholesterol: 178 mg/dL (ref 0–200)
HDL: 36.4 mg/dL — ABNORMAL LOW (ref >39.00)
LDL Cholesterol: 110 mg/dL — ABNORMAL HIGH (ref 0–99)
NonHDL: 141.6
Total CHOL/HDL Ratio: 5
Triglycerides: 159 mg/dL — ABNORMAL HIGH (ref 0.0–149.0)
VLDL: 31.8 mg/dL (ref 0.0–40.0)

## 2014-06-26 LAB — BASIC METABOLIC PANEL
BUN: 15 mg/dL (ref 6–23)
CO2: 28 mEq/L (ref 19–32)
Calcium: 9.4 mg/dL (ref 8.4–10.5)
Chloride: 101 mEq/L (ref 96–112)
Creatinine, Ser: 0.89 mg/dL (ref 0.40–1.50)
GFR: 91.15 mL/min (ref >60.00)
Glucose, Bld: 121 mg/dL — ABNORMAL HIGH (ref 70–99)
Potassium: 4.1 mEq/L (ref 3.5–5.1)
Sodium: 133 mEq/L — ABNORMAL LOW (ref 135–145)

## 2014-06-26 LAB — CBC WITH DIFFERENTIAL/PLATELET
Basophils Absolute: 0 10*3/uL (ref 0.0–0.1)
Basophils Relative: 0.8 % (ref 0.0–3.0)
Eosinophils Absolute: 0.1 10*3/uL (ref 0.0–0.7)
Eosinophils Relative: 2.7 % (ref 0.0–5.0)
HCT: 46.4 % (ref 39.0–52.0)
Hemoglobin: 16.3 g/dL (ref 13.0–17.0)
Lymphocytes Relative: 36.7 % (ref 12.0–46.0)
Lymphs Abs: 1.8 10*3/uL (ref 0.7–4.0)
MCHC: 35.2 g/dL (ref 30.0–36.0)
MCV: 100.2 fl — ABNORMAL HIGH (ref 78.0–100.0)
Monocytes Absolute: 0.5 10*3/uL (ref 0.1–1.0)
Monocytes Relative: 9.6 % (ref 3.0–12.0)
Neutro Abs: 2.5 10*3/uL (ref 1.4–7.7)
Neutrophils Relative %: 50.2 % (ref 43.0–77.0)
Platelets: 102 10*3/uL — ABNORMAL LOW (ref 150.0–400.0)
RBC: 4.63 Mil/uL (ref 4.22–5.81)
RDW: 15.8 % — ABNORMAL HIGH (ref 11.5–15.5)
WBC: 5 10*3/uL (ref 4.0–10.5)

## 2014-06-26 LAB — HEPATIC FUNCTION PANEL
ALT: 112 U/L — ABNORMAL HIGH (ref 0–53)
AST: 116 U/L — ABNORMAL HIGH (ref 0–37)
Albumin: 3.6 g/dL (ref 3.5–5.2)
Alkaline Phosphatase: 124 U/L — ABNORMAL HIGH (ref 39–117)
Bilirubin, Direct: 0.4 mg/dL — ABNORMAL HIGH (ref 0.0–0.3)
Total Bilirubin: 1.2 mg/dL (ref 0.2–1.2)
Total Protein: 7.7 g/dL (ref 6.0–8.3)

## 2014-06-26 MED ORDER — IPRATROPIUM-ALBUTEROL 20-100 MCG/ACT IN AERS: 1.0000 | INHALATION_SPRAY | Freq: Four times a day (QID) | RESPIRATORY_TRACT | Status: DC

## 2014-06-26 MED ORDER — LISINOPRIL 5 MG PO TABS: 5.0000 mg | ORAL_TABLET | Freq: Every day | ORAL | Status: DC

## 2014-06-26 MED ORDER — BECLOMETHASONE DIPROPIONATE 40 MCG/ACT IN AERS: 1.0000 | INHALATION_SPRAY | Freq: Two times a day (BID) | RESPIRATORY_TRACT | Status: DC

## 2014-06-26 MED ORDER — METOPROLOL TARTRATE 25 MG PO TABS: 25.0000 mg | ORAL_TABLET | Freq: Two times a day (BID) | ORAL | Status: DC

## 2014-06-26 NOTE — Progress Notes (Signed)
   Subjective:    Patient ID: Erby Canseco, male    DOB: [birth year], 64 y.o.   MRN: [id number]  HPI Alcohol abuse- pt went to hospital on 4/14 while heavily intoxicated.  Pt reports that he is still drinking but 'very little'.    Depression- pt went to hospital on 4/14 w/ vague suicidal thoughts.  Has decreased his drinking which tends to worsen his mood.  Has restarted his Citalopram 40mg .  Not currently in counseling, 'but i do have one in mind'.    CAD- pt was previously seeing Dr Shirlee Latch.  Was on Lisinopril, Metoprolol.  Unable to take statin due to abnormal LFTs.  Denies CP.  LBP- R sided, only occurs w/ sitting.  No pain when lying or standing.  Pt reports drinking 'eases the pain'.  Pt reports pain has been present x2 weeks.  Pt denies injury.  Has increased his walking which has improved his pain.  COPD- pt reports that early March he was having 'terrible coughing spells'.  Went to UC on 3/8 and was dx'd w/ COPD exacerbation and started on Combivent Inhaler 1 puff q6.  Pt is only using inhaler as needed- 1 or 2 puffs/day.  Is now down to 1-2 cigarettes daily.  SOB is improving.   Review of Systems For ROS see HPI     Objective:   Physical Exam  Constitutional: He is oriented to person, place, and time. He appears well-developed and well-nourished. No distress.  HENT:  Head: Normocephalic and atraumatic.  Eyes: Conjunctivae and EOM are normal. Pupils are equal, round, and reactive to light.  Neck: Normal range of motion. Neck supple. No thyromegaly present.  Cardiovascular: Normal rate, regular rhythm, normal heart sounds and intact distal pulses.   No murmur heard. Pulmonary/Chest: Effort normal and breath sounds normal. No respiratory distress.  Abdominal: Soft. Bowel sounds are normal. He exhibits no distension.  Musculoskeletal: He exhibits tenderness (TTP over R SI joint.  good back extension and flexion). He exhibits no edema.  Lymphadenopathy:    He has no cervical  adenopathy.  Neurological: He is alert and oriented to person, place, and time. He has normal reflexes. No cranial nerve deficit. Coordination normal.  (-) SLR bilaterally  Skin: Skin is warm and dry.  Psychiatric: He has a normal mood and affect. His behavior is normal.  Vitals reviewed.         Assessment & Plan:

## 2014-06-26 NOTE — Progress Notes (Signed)
Pre visit review using our clinic review tool, if applicable. No additional management support is needed unless otherwise documented below in the visit note. 

## 2014-06-26 NOTE — Patient Instructions (Signed)
Follow up in 1 month to recheck BP We'll notify you of your lab results and make any changes if needed Once I see your kidney function, I can prescribe a medication to help w/ your pain and inflammation (if able) Restart the Lisinopril, Metoprolol daily due to your history of heart attack Start the Qvar- 1 puff twice daily regardless of how you feel Use the Combivent as needed for chest tightness/wheezing Please call and schedule an appt w/ your counselor Call with any questions or concerns Sherri Rad in there!!!

## 2014-06-27 ENCOUNTER — Other Ambulatory Visit: Payer: Self-pay | Admitting: General Practice

## 2014-06-27 ENCOUNTER — Other Ambulatory Visit (INDEPENDENT_AMBULATORY_CARE_PROVIDER_SITE_OTHER): Payer: Medicare Other

## 2014-06-27 DIAGNOSIS — R7309 Other abnormal glucose: Secondary | ICD-10-CM

## 2014-06-27 DIAGNOSIS — R739 Hyperglycemia, unspecified: Secondary | ICD-10-CM

## 2014-06-27 LAB — HEMOGLOBIN A1C: Hgb A1c MFr Bld: 5.8 % (ref 4.6–6.5)

## 2014-06-27 MED ORDER — MELOXICAM 15 MG PO TABS: 15.0000 mg | ORAL_TABLET | Freq: Every day | ORAL | Status: DC

## 2014-06-27 NOTE — Assessment & Plan Note (Signed)
Chronic problem.  Pt continues to drink despite recent behavorial health admission.  Again strongly encouraged him/cautioned him to quit drinking entirely.  Pt doesn't seem willing to do this at this time.  Will continue to follow.

## 2014-06-27 NOTE — Assessment & Plan Note (Signed)
New.  Pt has been using Combivent inhaler multiple times daily and continues to smoke.  Strongly encouraged him to stop smoking.  Due to excessive combivent use, will start daily controller ICS.  Pt instructed on proper use and red flags that should prompt medical attention.

## 2014-06-27 NOTE — Assessment & Plan Note (Signed)
Chronic problem.  Pt has been noncompliant w/ treatment and f/u.  Was previously on ACE and beta blocker- will restart these today.  Stressed need for compliance and routine follow up.  If pt is not able to do this, will need to dismiss due to his ongoing risky behavior.

## 2014-06-27 NOTE — Assessment & Plan Note (Signed)
New.  No narcotics as pt has hx of polysubstance abuse.  Will check renal fxn and if normal will start Mobic.  Pt expressed understanding and is in agreement w/ plan.

## 2014-06-27 NOTE — Assessment & Plan Note (Signed)
Chronic problem.  Pt reports sxs have improved since restarting his celexa.  Is drinking less than previously but continues to drink.  Not currently in counseling but plans to schedule.  Encouraged him to do so.  Will follow.

## 2014-07-26 ENCOUNTER — Ambulatory Visit (INDEPENDENT_AMBULATORY_CARE_PROVIDER_SITE_OTHER): Payer: Medicare Other | Admitting: Family Medicine

## 2014-07-26 ENCOUNTER — Encounter: Payer: Self-pay | Admitting: Family Medicine

## 2014-07-26 VITALS — BP 140/80 | HR 64 | Temp 98.1°F | Resp 16 | Wt 162.4 lb

## 2014-07-26 DIAGNOSIS — F1010 Alcohol abuse, uncomplicated: Secondary | ICD-10-CM | POA: Diagnosis not present

## 2014-07-26 DIAGNOSIS — J438 Other emphysema: Secondary | ICD-10-CM | POA: Diagnosis not present

## 2014-07-26 DIAGNOSIS — I2510 Atherosclerotic heart disease of native coronary artery without angina pectoris: Secondary | ICD-10-CM

## 2014-07-26 LAB — BASIC METABOLIC PANEL
BUN: 16 mg/dL (ref 6–23)
CO2: 30 mEq/L (ref 19–32)
Calcium: 9.4 mg/dL (ref 8.4–10.5)
Chloride: 99 mEq/L (ref 96–112)
Creatinine, Ser: 1.05 mg/dL (ref 0.40–1.50)
GFR: 75.3 mL/min (ref >60.00)
Glucose, Bld: 92 mg/dL (ref 70–99)
Potassium: 4.3 mEq/L (ref 3.5–5.1)
Sodium: 135 mEq/L (ref 135–145)

## 2014-07-26 NOTE — Patient Instructions (Signed)
Follow up as scheduled for your physical exam We'll notify you of your lab results and make any changes if needed Keep up the good work!  I'm so proud of you! You have almost quit smoking- you can do this! Congrats on cutting back on the drinking- this is great! Call with any questions or concerns Have a great summer!

## 2014-07-26 NOTE — Assessment & Plan Note (Signed)
Pt has resumed Metoprolol and Lisinopril w/o difficulty.  Check BMP today to assess renal fxn in setting of ACE.  Will continue to follow.

## 2014-07-26 NOTE — Assessment & Plan Note (Signed)
Much improved since starting the Qvar daily.  Pt is not requiring his Combivent at this time.  Has dramatically decreased his smoking which he notes has also helped.  Applauded his efforts.  Will continue to follow.

## 2014-07-26 NOTE — Progress Notes (Signed)
Pre visit review using our clinic review tool, if applicable. No additional management support is needed unless otherwise documented below in the visit note. 

## 2014-07-26 NOTE — Assessment & Plan Note (Signed)
Pt is almost a completely different person than at last visit.  He is clean, well kept, pleasant, well spoken.  He attributes this to nearly completely stopping drinking.  I applauded his efforts and told him how impressed I was with this change.  Will continue to follow.

## 2014-07-26 NOTE — Progress Notes (Signed)
   Subjective:    Patient ID: Hunter Mcmillan, male    DOB: 06/20/1950, 64 y.o.   MRN: [id number]  HPI CAD- chronic problem, pt is taking both Metoprolol and Lisinopril.  Pt takes both meds at noon.  Home BPs in the afternoon are usually 120s/70s.  No CP, SOB, HAs, visual changes, edema.  COPD- chronic problem, last visit pt was started on Qvar.  Pt has been exercising more regularly.  Pt has not required Combivent.  Pt has cut back to 2 cigarettes daily.  Denies SOB, wheezing.  ETOH abuse- chronic problem, pt reports very little drinking at this point.  'i feel better'.  Review of Systems For ROS see HPI     Objective:   Physical Exam  Constitutional: He is oriented to person, place, and time. He appears well-developed and well-nourished. No distress.  HENT:  Head: Normocephalic and atraumatic.  Eyes: Conjunctivae and EOM are normal. Pupils are equal, round, and reactive to light.  Neck: Normal range of motion. Neck supple. No thyromegaly present.  Cardiovascular: Normal rate, regular rhythm, normal heart sounds and intact distal pulses.   No murmur heard. Pulmonary/Chest: Effort normal and breath sounds normal. No respiratory distress.  Abdominal: Soft. Bowel sounds are normal. He exhibits no distension.  Musculoskeletal: He exhibits no edema.  Lymphadenopathy:    He has no cervical adenopathy.  Neurological: He is alert and oriented to person, place, and time. No cranial nerve deficit.  Skin: Skin is warm and dry.  Psychiatric: He has a normal mood and affect. His behavior is normal.  Vitals reviewed.         Assessment & Plan:

## 2014-09-18 ENCOUNTER — Telehealth: Payer: Self-pay | Admitting: Family Medicine

## 2014-09-18 NOTE — Telephone Encounter (Signed)
pre visit letter mailed 09/14/14 °

## 2014-10-04 ENCOUNTER — Telehealth: Payer: Self-pay

## 2014-10-04 NOTE — Telephone Encounter (Signed)
Pre visit call completed 

## 2014-10-05 ENCOUNTER — Encounter: Payer: Self-pay | Admitting: Family Medicine

## 2014-10-05 ENCOUNTER — Ambulatory Visit (INDEPENDENT_AMBULATORY_CARE_PROVIDER_SITE_OTHER): Payer: Medicare Other | Admitting: Family Medicine

## 2014-10-05 VITALS — BP 128/80 | HR 85 | Temp 98.0°F | Resp 16 | Ht 70.0 in | Wt 158.2 lb

## 2014-10-05 DIAGNOSIS — Z23 Encounter for immunization: Secondary | ICD-10-CM

## 2014-10-05 DIAGNOSIS — F329 Major depressive disorder, single episode, unspecified: Secondary | ICD-10-CM | POA: Diagnosis not present

## 2014-10-05 DIAGNOSIS — I2510 Atherosclerotic heart disease of native coronary artery without angina pectoris: Secondary | ICD-10-CM

## 2014-10-05 DIAGNOSIS — F32A Depression, unspecified: Secondary | ICD-10-CM

## 2014-10-05 DIAGNOSIS — J449 Chronic obstructive pulmonary disease, unspecified: Secondary | ICD-10-CM

## 2014-10-05 DIAGNOSIS — F1010 Alcohol abuse, uncomplicated: Secondary | ICD-10-CM | POA: Diagnosis not present

## 2014-10-05 DIAGNOSIS — Z125 Encounter for screening for malignant neoplasm of prostate: Secondary | ICD-10-CM

## 2014-10-05 DIAGNOSIS — Z0000 Encounter for general adult medical examination without abnormal findings: Secondary | ICD-10-CM | POA: Diagnosis not present

## 2014-10-05 DIAGNOSIS — B1920 Unspecified viral hepatitis C without hepatic coma: Secondary | ICD-10-CM

## 2014-10-05 DIAGNOSIS — L819 Disorder of pigmentation, unspecified: Secondary | ICD-10-CM

## 2014-10-05 LAB — CBC WITH DIFFERENTIAL/PLATELET
Basophils Absolute: 0 10*3/uL (ref 0.0–0.1)
Basophils Relative: 0.5 % (ref 0.0–3.0)
Eosinophils Absolute: 0.1 10*3/uL (ref 0.0–0.7)
Eosinophils Relative: 2.4 % (ref 0.0–5.0)
HCT: 46.4 % (ref 39.0–52.0)
Hemoglobin: 16.1 g/dL (ref 13.0–17.0)
Lymphocytes Relative: 38.7 % (ref 12.0–46.0)
Lymphs Abs: 2 10*3/uL (ref 0.7–4.0)
MCHC: 34.8 g/dL (ref 30.0–36.0)
MCV: 103.8 fl — ABNORMAL HIGH (ref 78.0–100.0)
Monocytes Absolute: 0.5 10*3/uL (ref 0.1–1.0)
Monocytes Relative: 10.6 % (ref 3.0–12.0)
Neutro Abs: 2.4 10*3/uL (ref 1.4–7.7)
Neutrophils Relative %: 47.8 % (ref 43.0–77.0)
Platelets: 93 10*3/uL — ABNORMAL LOW (ref 150.0–400.0)
RBC: 4.47 Mil/uL (ref 4.22–5.81)
RDW: 14.8 % (ref 11.5–15.5)
WBC: 5.1 10*3/uL (ref 4.0–10.5)

## 2014-10-05 LAB — LIPID PANEL
Cholesterol: 162 mg/dL (ref 0–200)
HDL: 30 mg/dL — ABNORMAL LOW (ref >39.00)
LDL Cholesterol: 107 mg/dL — ABNORMAL HIGH (ref 0–99)
NonHDL: 131.64
Total CHOL/HDL Ratio: 5
Triglycerides: 125 mg/dL (ref 0.0–149.0)
VLDL: 25 mg/dL (ref 0.0–40.0)

## 2014-10-05 LAB — BASIC METABOLIC PANEL
BUN: 11 mg/dL (ref 6–23)
CO2: 26 mEq/L (ref 19–32)
Calcium: 9.4 mg/dL (ref 8.4–10.5)
Chloride: 102 mEq/L (ref 96–112)
Creatinine, Ser: 1.1 mg/dL (ref 0.40–1.50)
GFR: 71.32 mL/min (ref >60.00)
Glucose, Bld: 93 mg/dL (ref 70–99)
Potassium: 4 mEq/L (ref 3.5–5.1)
Sodium: 138 mEq/L (ref 135–145)

## 2014-10-05 LAB — HEPATIC FUNCTION PANEL
ALT: 199 U/L — ABNORMAL HIGH (ref 0–53)
AST: 195 U/L — ABNORMAL HIGH (ref 0–37)
Albumin: 4 g/dL (ref 3.5–5.2)
Alkaline Phosphatase: 110 U/L (ref 39–117)
Bilirubin, Direct: 0.7 mg/dL — ABNORMAL HIGH (ref 0.0–0.3)
Total Bilirubin: 1.4 mg/dL — ABNORMAL HIGH (ref 0.2–1.2)
Total Protein: 8.1 g/dL (ref 6.0–8.3)

## 2014-10-05 LAB — PSA: PSA: 1.18 ng/mL (ref 0.10–4.00)

## 2014-10-05 LAB — TSH: TSH: 0.67 u[IU]/mL (ref 0.35–4.50)

## 2014-10-05 NOTE — Progress Notes (Signed)
   Subjective:    Patient ID: Hunter Mcmillan, male    DOB: [birth year], 64 y.o.   MRN: [id number]  HPI Here today for CPE.  Risk Factors: CAD- chronic problem, on beta blocker and ACE. Denies CP, SOB. COPD- chronic problem, on Qvar w/ good control.  Has decreased smoking to 1-2 cigarettes daily.  Pt's goal is to quit. ETOH abuse- chronic problem, pt has started drinking again.  Drank 2 beers on 2 separate occasions in the last week. Physical Activity: walking regularly, goal is 1 mile daily Fall Risk: low risk Depression: chronic problem, on Celexa Hearing: normal to conversational tones and whispered voice at 6 ft ADL's: independent Cognitive: normal linear thought process, memory and attention intact Home Safety: safe at home, lives alone Height, Weight, BMI, Visual Acuity: see vitals, vision corrected to 20/20 w/ glasses Counseling: UTD on colonoscopy (2022).  Due for Prevnar. Care team reviewed and updated w/ pt. Advanced directive/Health Care POA: pt doesn't have either, willing to review information Labs Ordered: See A&P Care Plan: See A&P    Review of Systems Patient reports no vision/hearing changes, anorexia, fever ,adenopathy, persistant/recurrent hoarseness, swallowing issues, chest pain, palpitations, edema, persistant/recurrent cough, hemoptysis, dyspnea (rest,exertional, paroxysmal nocturnal), gastrointestinal  bleeding (melena, rectal bleeding), abdominal pain, excessive heart burn, GU symptoms (dysuria, hematuria, voiding/incontinence issues) syncope, focal weakness, memory loss, numbness & tingling, skin/hair/nail changes, depression, anxiety, abnormal bruising/bleeding, musculoskeletal symptoms/signs.     Objective:   Physical Exam General Appearance:    Alert, cooperative, no distress, appears stated age  Head:    Normocephalic, without obvious abnormality, atraumatic  Eyes:    PERRL, conjunctiva/corneas clear, EOM's intact, fundi    benign, both eyes       Ears:     Normal TM's and external ear canals, both ears  Nose:   Nares normal, septum midline, mucosa normal, no drainage   or sinus tenderness  Throat:   Lips, mucosa, and tongue normal; teeth and gums normal  Neck:   Supple, symmetrical, trachea midline, no adenopathy;       thyroid:  No enlargement/tenderness/nodules  Back:     Symmetric, no curvature, ROM normal, no CVA tenderness  Lungs:     Clear to auscultation bilaterally, respirations unlabored  Chest wall:    No tenderness or deformity  Heart:    Regular rate and rhythm, S1 and S2 normal, no murmur, rub   or gallop  Abdomen:     Soft, non-tender, bowel sounds active all four quadrants,    no masses, no organomegaly  Genitalia:    Pt declined  Rectal:    Extremities:   Extremities normal, atraumatic, no cyanosis or edema  Pulses:   2+ and symmetric all extremities  Skin:   Skin color, texture, turgor normal, no rashes or lesions  Lymph nodes:   Cervical, supraclavicular, and axillary nodes normal  Neurologic:   CNII-XII intact. Normal strength, sensation and reflexes      throughout          Assessment & Plan:

## 2014-10-05 NOTE — Patient Instructions (Signed)
Follow up in 6 months to recheck blood pressure and mood We'll notify you of your lab results and make any changes if needed Review the Advanced Directive forms and complete at your convenience You are up to date on your colonoscopy until 2022 You can quit smoking!!!  You're almost there! Please try and avoid drinking- this can be a slippery slope Call with any questions or concerns Enjoy the rest of your summer!!!

## 2014-10-05 NOTE — Progress Notes (Signed)
Pre visit review using our clinic review tool, if applicable. No additional management support is needed unless otherwise documented below in the visit note. 

## 2014-10-06 ENCOUNTER — Telehealth: Payer: Self-pay | Admitting: Family Medicine

## 2014-10-06 ENCOUNTER — Other Ambulatory Visit: Payer: Self-pay | Admitting: Family Medicine

## 2014-10-06 DIAGNOSIS — B1920 Unspecified viral hepatitis C without hepatic coma: Secondary | ICD-10-CM

## 2014-10-06 NOTE — Telephone Encounter (Signed)
Called patient at [phone] [facility]) *Preferred* and left message to return call.

## 2014-10-06 NOTE — Telephone Encounter (Signed)
Caller name:Lewellyn Robertt Relation to GG:EZMO Call back number:[phone] Pharmacy:  Reason for call: pt returning your call regarding lab results

## 2014-10-06 NOTE — Telephone Encounter (Signed)
Pt notified of results

## 2014-10-06 NOTE — Telephone Encounter (Signed)
Caller name: Declen Homrich  Relationship to patient: Self  Can be reached: [phone] Pharmacy:  Reason for call: Pt returning your call.

## 2014-10-09 NOTE — Telephone Encounter (Signed)
Patient was returning call regarding lab results.  Already notified by Lennox Pippins, CMA.

## 2014-10-19 ENCOUNTER — Telehealth: Payer: Self-pay | Admitting: Lab

## 2014-10-19 NOTE — Telephone Encounter (Signed)
See telephone notes

## 2014-10-23 ENCOUNTER — Other Ambulatory Visit: Payer: Medicare Other

## 2014-10-23 DIAGNOSIS — B182 Chronic viral hepatitis C: Secondary | ICD-10-CM

## 2014-10-23 LAB — IRON: Iron: 180 ug/dL — ABNORMAL HIGH (ref 42–165)

## 2014-10-24 LAB — ANA: Anti Nuclear Antibody(ANA): NEGATIVE

## 2014-10-24 LAB — HEPATITIS C RNA QUANTITATIVE
HCV Quantitative Log: 6.31 {Log} — ABNORMAL HIGH (ref <1.18)
HCV Quantitative: 2031944 IU/mL — ABNORMAL HIGH (ref <15)

## 2014-10-24 LAB — HIV ANTIBODY (ROUTINE TESTING W REFLEX): HIV 1&2 Ab, 4th Generation: NONREACTIVE

## 2014-10-24 LAB — HEPATITIS B SURFACE ANTIBODY,QUALITATIVE: Hep B S Ab: POSITIVE — AB

## 2014-10-24 LAB — PROTIME-INR
INR: 1.19 (ref <1.50)
Prothrombin Time: 15.1 seconds (ref 11.6–15.2)

## 2014-10-24 LAB — HEPATITIS A ANTIBODY, TOTAL: Hep A Total Ab: REACTIVE — AB

## 2014-10-24 LAB — HEPATITIS B CORE ANTIBODY, TOTAL: Hep B Core Total Ab: REACTIVE — AB

## 2014-10-24 LAB — HEPATITIS B SURFACE ANTIGEN: Hepatitis B Surface Ag: NEGATIVE

## 2014-10-25 ENCOUNTER — Encounter: Payer: Self-pay | Admitting: Lab

## 2014-10-25 NOTE — Telephone Encounter (Signed)
Error

## 2014-10-25 NOTE — Telephone Encounter (Signed)
Patient is scheduled to see Dr. Luciana Axe on 11/15/2014 @ 1020am

## 2014-10-27 LAB — HEPATITIS C GENOTYPE

## 2014-10-31 NOTE — Assessment & Plan Note (Signed)
Chronic problem for pt.  He continues to relapse and drink despite knowing what a big problem this is for him.  Again discussed that no ETOH was best for him.  Will continue to follow.

## 2014-10-31 NOTE — Assessment & Plan Note (Addendum)
Chronic problem.  Doing well on Qvar- now walking regularly w/o SOB.  Using rescue inhalers sparingly.  Will continue to follow.

## 2014-10-31 NOTE — Assessment & Plan Note (Signed)
Ongoing issue.  Worsens w/ drinking.  Doing much better on Celexa.  Will continue to follow.

## 2014-10-31 NOTE — Assessment & Plan Note (Signed)
Chronic problem.  On beta blocker, ACE, ASA.  Asymptomatic.  Check lipids to risk stratify.  Goal is BP and lipid control to minimize risk factors.

## 2014-10-31 NOTE — Assessment & Plan Note (Signed)
Pt is not currently in treatment.  Will again refer to Hep C clinic.

## 2014-10-31 NOTE — Assessment & Plan Note (Signed)
Pt's PE WNL.  UTD on colonoscopy.  Pt declined GU exam today.  prevnar given.  Check labs.  Anticipatory guidance provided.

## 2014-10-31 NOTE — Assessment & Plan Note (Signed)
Discussed that this is most consistent w/ venous stasis changes on his lower legs but pt is asking to see derm.  Referral placed

## 2014-11-15 ENCOUNTER — Encounter: Payer: Self-pay | Admitting: Internal Medicine

## 2014-11-15 ENCOUNTER — Ambulatory Visit (INDEPENDENT_AMBULATORY_CARE_PROVIDER_SITE_OTHER): Payer: Medicare Other | Admitting: Internal Medicine

## 2014-11-15 VITALS — BP 155/85 | HR 96 | Temp 97.9°F | Ht 70.0 in | Wt 158.0 lb

## 2014-11-15 DIAGNOSIS — B182 Chronic viral hepatitis C: Secondary | ICD-10-CM | POA: Diagnosis not present

## 2014-11-15 DIAGNOSIS — Z23 Encounter for immunization: Secondary | ICD-10-CM

## 2014-11-15 NOTE — Patient Instructions (Signed)
Date 11/15/2014  Dear Mr. Douse, As discussed in the ID Clinic, your hepatitis C therapy will include the following medications:          Harvoni 90mg /400mg  tablet:           Take 1 tablet by mouth once daily   Please note that ALL MEDICATIONS WILL START ON THE SAME DATE for a total of 12 weeks. ---------------------------------------------------------------- Your HCV Treatment Start Date: TBA   Your HCV genotype:  1a    Liver Fibrosis: TBD    ---------------------------------------------------------------- YOUR PHARMACY CONTACT:   [facility] [address] Phone: [phone] Hours: Monday to Friday 7:30 am to 6:00 pm   Please always contact your pharmacy at least 3-4 business days before you run out of medications to ensure your next month's medication is ready or 1 week prior to running out if you receive it by mail.  Remember, each prescription is for 28 days. ---------------------------------------------------------------- GENERAL NOTES REGARDING YOUR HEPATITIS C MEDICATION:  SOFOSBUVIR/LEDIPASVIR (HARVONI): - Harvoni tablet is taken daily with OR without food. - The tablets are orange. - The tablets should be stored at room temperature.  - Acid reducing agents such as H2 blockers (ie. Pepcid (famotidine), Zantac (ranitidine), Tagamet (cimetidine), Axid (nizatidine) and proton pump inhibitors (ie. Prilosec (omeprazole), Protonix (pantoprazole), Nexium (esomeprazole), or Aciphex (rabeprazole)) can decrease effectiveness of Harvoni. Do not take until you have discussed with a health care provider.    -Antacids that contain magnesium and/or aluminum hydroxide (ie. Milk of Magensia, Rolaids, Gaviscon, Maalox, Mylanta, an dArthritis Pain Formula)can reduce absorption of Harvoni, so take them at least 4 hours before or after Harvoni.  -Calcium carbonate (calcium supplements or antacids such as Tums, Caltrate,  Os-Cal)needs to be taken at least 4 hours hours before or after Harvoni.  -St. John's wort or any products that contain St. John's wort like some herbal supplements  Please inform the office prior to starting any of these medications.  - The common side effects associated with Harvoni include:      1. Fatigue      2. Headache      3. Nausea      4. Diarrhea      5. Insomnia   [facility] is a suite of resources designed to help patients start with HARVONI and move toward treatment completion GETTING STARTED [facility] helps patients access therapy and get off to an efficient start  Benefits investigation and prior authorization support Co-pay and other financial assistance A specialty pharmacy finder CO-PAY COUPON The HARVONI co-pay coupon may help eligible patients lower their out-of-pocket costs. With a co-pay coupon, most eligible patients may pay no more than $5 per co-pay (restrictions apply) www.harvoni.com call [phone])[phone] Not valid for patients enrolled in government healthcare prescription drug programs, such as Medicare Part D and Medicaid. Patients in the coverage gap known as the "donut hole" also are not eligible The HARVONI co-pay coupon program will cover the out-of-pocket costs for HARVONI prescriptions up to a maximum of 25% of the catalog price of a 12-week regimen of HARVONI  Please note that this only lists the most common side effects and is NOT a comprehensive list of the potential side effects of these medications. For more information, please review the drug information sheets that come with your medication package from the pharmacy.  ---------------------------------------------------------------- GENERAL HELPFUL HINTS ON HCV THERAPY: 1. No alcohol. 2. Protect against sun-sensitivity/sunburns (wear sunglasses, hat, long  sleeves, pants and sunscreen). 3. Stay well-hydrated/well-moisturized. 4. Notify the ID Clinic of any changes in your other  over-the-counter/herbal or prescription medications. 5. If you miss a dose of your medication, take the missed dose as soon as you remember. Return to your regular time/dose schedule the next day.  6.  Do not stop taking your medications without first talking with your healthcare provider. 7.  You may take Tylenol (acetaminophen), as long as the dose is less than 2000 mg (OR no more than 4 tablets of the Tylenol Extra Strengths 500mg  tablet) in 24 hours. 8.  You will need to obtain routine labs and/or office visits at [facility] at weeks 4 and 12 as well as 12 and 24 weeks after completion of treatment.   Scharlene Gloss, [place] [facility] [place], [place]  [id number] [phone]
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
## 2014-12-14 ENCOUNTER — Ambulatory Visit (HOSPITAL_COMMUNITY)
Admission: RE | Admit: 2014-12-14 | Discharge: 2014-12-14 | Disposition: A | Discharge status: Home / Self Care | Payer: Medicare Other | Source: Ambulatory Visit | Attending: Internal Medicine | Admitting: Internal Medicine

## 2014-12-14 DIAGNOSIS — B182 Chronic viral hepatitis C: Secondary | ICD-10-CM | POA: Insufficient documentation

## 2014-12-15 ENCOUNTER — Other Ambulatory Visit: Payer: Self-pay | Admitting: Internal Medicine

## 2014-12-15 MED ORDER — SOFOSBUVIR-VELPATASVIR 400-100 MG PO TABS: 1.0000 | ORAL_TABLET | Freq: Every day | ORAL | Status: DC

## 2014-12-27 ENCOUNTER — Telehealth: Payer: Self-pay | Admitting: Pharmacy Technician

## 2015-01-10 ENCOUNTER — Other Ambulatory Visit: Payer: Self-pay | Admitting: *Deleted

## 2015-01-10 DIAGNOSIS — B182 Chronic viral hepatitis C: Secondary | ICD-10-CM

## 2015-01-11 ENCOUNTER — Encounter: Payer: Self-pay | Admitting: Internal Medicine

## 2015-01-11 ENCOUNTER — Ambulatory Visit (INDEPENDENT_AMBULATORY_CARE_PROVIDER_SITE_OTHER): Payer: Medicare Other | Admitting: Internal Medicine

## 2015-01-11 ENCOUNTER — Other Ambulatory Visit: Payer: Self-pay

## 2015-01-11 VITALS — BP 149/90 | HR 80 | Temp 97.4°F | Ht 71.0 in | Wt 148.0 lb

## 2015-01-11 DIAGNOSIS — K7460 Unspecified cirrhosis of liver: Secondary | ICD-10-CM | POA: Diagnosis not present

## 2015-01-11 DIAGNOSIS — B182 Chronic viral hepatitis C: Secondary | ICD-10-CM

## 2015-01-11 DIAGNOSIS — I2510 Atherosclerotic heart disease of native coronary artery without angina pectoris: Secondary | ICD-10-CM | POA: Diagnosis not present

## 2015-01-12 NOTE — Assessment & Plan Note (Signed)
Waiting for approval.  NS5A testing today for possibility of Zepatier.  Will return once he starts.

## 2015-01-12 NOTE — Assessment & Plan Note (Addendum)
Will need screening ultrasound every 6 months.  He will need to see GI for EGD if not already done.  Will check with pt next visit.

## 2015-01-12 NOTE — Progress Notes (Signed)
   Subjective:    Patient ID: Hunter Mcmillan, male    DOB: 09-Jan-1951, 64 y.o.   MRN: [id number]  HPI Here for follow up of HCV.    Has genotype 1a, viral load of 2 million, hepatitis A and B immune.  Elastography with F3/4.  Has had pneumovax.  Due to insurance issues, trying to get Zepatier via [facility].     Review of Systems  Constitutional: Negative for fatigue.  Gastrointestinal: Negative for nausea and diarrhea.  Neurological: Negative for headaches.       Objective:   Physical Exam  Constitutional: He appears well-developed and well-nourished. No distress.  Eyes: No scleral icterus.  Skin: No rash noted.          Assessment & Plan:

## 2015-01-15 LAB — HCV RNA NS5A DRUG RESISTANCE

## 2015-02-21 ENCOUNTER — Emergency Department (HOSPITAL_COMMUNITY)
Admission: EM | Admit: 2015-02-21 | Discharge: 2015-02-21 | Disposition: A | Discharge status: Home / Self Care | Payer: Medicare Other | Attending: Emergency Medicine | Admitting: Emergency Medicine

## 2015-02-21 ENCOUNTER — Emergency Department (HOSPITAL_COMMUNITY): Payer: Medicare Other

## 2015-02-21 ENCOUNTER — Encounter (HOSPITAL_COMMUNITY): Payer: Self-pay | Admitting: Emergency Medicine

## 2015-02-21 DIAGNOSIS — I2510 Atherosclerotic heart disease of native coronary artery without angina pectoris: Secondary | ICD-10-CM | POA: Diagnosis not present

## 2015-02-21 DIAGNOSIS — Z79899 Other long term (current) drug therapy: Secondary | ICD-10-CM | POA: Diagnosis not present

## 2015-02-21 DIAGNOSIS — I252 Old myocardial infarction: Secondary | ICD-10-CM | POA: Insufficient documentation

## 2015-02-21 DIAGNOSIS — Z7982 Long term (current) use of aspirin: Secondary | ICD-10-CM | POA: Insufficient documentation

## 2015-02-21 DIAGNOSIS — Z9889 Other specified postprocedural states: Secondary | ICD-10-CM | POA: Diagnosis not present

## 2015-02-21 DIAGNOSIS — Z7951 Long term (current) use of inhaled steroids: Secondary | ICD-10-CM | POA: Insufficient documentation

## 2015-02-21 DIAGNOSIS — F17210 Nicotine dependence, cigarettes, uncomplicated: Secondary | ICD-10-CM | POA: Diagnosis not present

## 2015-02-21 DIAGNOSIS — Z8719 Personal history of other diseases of the digestive system: Secondary | ICD-10-CM | POA: Insufficient documentation

## 2015-02-21 DIAGNOSIS — R1010 Upper abdominal pain, unspecified: Secondary | ICD-10-CM

## 2015-02-21 DIAGNOSIS — K8020 Calculus of gallbladder without cholecystitis without obstruction: Secondary | ICD-10-CM | POA: Insufficient documentation

## 2015-02-21 DIAGNOSIS — R110 Nausea: Secondary | ICD-10-CM | POA: Insufficient documentation

## 2015-02-21 DIAGNOSIS — R1111 Vomiting without nausea: Secondary | ICD-10-CM | POA: Diagnosis not present

## 2015-02-21 DIAGNOSIS — R1011 Right upper quadrant pain: Secondary | ICD-10-CM | POA: Insufficient documentation

## 2015-02-21 DIAGNOSIS — R160 Hepatomegaly, not elsewhere classified: Secondary | ICD-10-CM | POA: Insufficient documentation

## 2015-02-21 DIAGNOSIS — Z862 Personal history of diseases of the blood and blood-forming organs and certain disorders involving the immune mechanism: Secondary | ICD-10-CM | POA: Diagnosis not present

## 2015-02-21 DIAGNOSIS — Z8619 Personal history of other infectious and parasitic diseases: Secondary | ICD-10-CM | POA: Insufficient documentation

## 2015-02-21 DIAGNOSIS — R1013 Epigastric pain: Secondary | ICD-10-CM | POA: Insufficient documentation

## 2015-02-21 DIAGNOSIS — R109 Unspecified abdominal pain: Secondary | ICD-10-CM | POA: Diagnosis not present

## 2015-02-21 DIAGNOSIS — F329 Major depressive disorder, single episode, unspecified: Secondary | ICD-10-CM | POA: Insufficient documentation

## 2015-02-21 DIAGNOSIS — K8050 Calculus of bile duct without cholangitis or cholecystitis without obstruction: Secondary | ICD-10-CM | POA: Diagnosis not present

## 2015-02-21 DIAGNOSIS — R140 Abdominal distension (gaseous): Secondary | ICD-10-CM | POA: Diagnosis not present

## 2015-02-21 LAB — URINALYSIS, ROUTINE W REFLEX MICROSCOPIC
Glucose, UA: NEGATIVE mg/dL
Hgb urine dipstick: NEGATIVE
Ketones, ur: 15 mg/dL — AB
Nitrite: NEGATIVE
Protein, ur: NEGATIVE mg/dL
Specific Gravity, Urine: 1.024 (ref 1.005–1.030)
pH: 6 (ref 5.0–8.0)

## 2015-02-21 LAB — CBC WITH DIFFERENTIAL/PLATELET
Basophils Absolute: 0 10*3/uL (ref 0.0–0.1)
Basophils Relative: 0 %
Eosinophils Absolute: 0 10*3/uL (ref 0.0–0.7)
Eosinophils Relative: 1 %
HCT: 41.7 % (ref 39.0–52.0)
Hemoglobin: 14.8 g/dL (ref 13.0–17.0)
Lymphocytes Relative: 22 %
Lymphs Abs: 1.2 10*3/uL (ref 0.7–4.0)
MCH: 35.7 pg — ABNORMAL HIGH (ref 26.0–34.0)
MCHC: 35.5 g/dL (ref 30.0–36.0)
MCV: 100.5 fL — ABNORMAL HIGH (ref 78.0–100.0)
Monocytes Absolute: 0.6 10*3/uL (ref 0.1–1.0)
Monocytes Relative: 10 %
Neutro Abs: 3.6 10*3/uL (ref 1.7–7.7)
Neutrophils Relative %: 67 %
Platelets: 85 10*3/uL — ABNORMAL LOW (ref 150–400)
RBC: 4.15 MIL/uL — ABNORMAL LOW (ref 4.22–5.81)
RDW: 14 % (ref 11.5–15.5)
WBC: 5.4 10*3/uL (ref 4.0–10.5)

## 2015-02-21 LAB — COMPREHENSIVE METABOLIC PANEL
ALT: 134 U/L — ABNORMAL HIGH (ref 17–63)
AST: 141 U/L — ABNORMAL HIGH (ref 15–41)
Albumin: 3.5 g/dL (ref 3.5–5.0)
Alkaline Phosphatase: 118 U/L (ref 38–126)
Anion gap: 10 (ref 5–15)
BUN: 18 mg/dL (ref 6–20)
CO2: 28 mmol/L (ref 22–32)
Calcium: 9.8 mg/dL (ref 8.9–10.3)
Chloride: 99 mmol/L — ABNORMAL LOW (ref 101–111)
Creatinine, Ser: 1.07 mg/dL (ref 0.61–1.24)
GFR calc Af Amer: >60 mL/min (ref >60)
GFR calc non Af Amer: >60 mL/min (ref >60)
Glucose, Bld: 137 mg/dL — ABNORMAL HIGH (ref 65–99)
Potassium: 3.6 mmol/L (ref 3.5–5.1)
Sodium: 137 mmol/L (ref 135–145)
Total Bilirubin: 1.5 mg/dL — ABNORMAL HIGH (ref 0.3–1.2)
Total Protein: 7.5 g/dL (ref 6.5–8.1)

## 2015-02-21 LAB — URINE MICROSCOPIC-ADD ON

## 2015-02-21 LAB — LIPASE, BLOOD: Lipase: 36 U/L (ref 11–51)

## 2015-02-21 MED ORDER — SODIUM CHLORIDE 0.9 % IV BOLUS (SEPSIS)
1000.0000 mL | Freq: Once | INTRAVENOUS | Status: AC
  Administered 2015-02-21: 1000 mL via INTRAVENOUS

## 2015-02-21 MED ORDER — OXYCODONE HCL 5 MG PO TABS: 5.0000 mg | ORAL_TABLET | Freq: Four times a day (QID) | ORAL | Status: DC | PRN

## 2015-02-21 MED ORDER — PROMETHAZINE HCL 25 MG PO TABS: 25.0000 mg | ORAL_TABLET | Freq: Three times a day (TID) | ORAL | Status: DC | PRN

## 2015-02-21 MED ORDER — MOXIFLOXACIN HCL 400 MG PO TABS
400.0000 mg | ORAL_TABLET | Freq: Every day | ORAL | Status: DC
Start: 2015-02-21 — End: 2015-10-08

## 2015-02-21 MED ORDER — FENTANYL CITRATE (PF) 100 MCG/2ML IJ SOLN
100.0000 ug | Freq: Once | INTRAMUSCULAR | Status: AC
  Administered 2015-02-21: 100 ug via INTRAVENOUS
  Filled 2015-02-21: qty 2

## 2015-02-21 MED ORDER — IOHEXOL 300 MG/ML  SOLN
25.0000 mL | Freq: Once | INTRAMUSCULAR | Status: AC | PRN
  Administered 2015-02-21: 25 mL via ORAL

## 2015-02-21 MED ORDER — ONDANSETRON HCL 4 MG/2ML IJ SOLN
4.0000 mg | Freq: Once | INTRAMUSCULAR | Status: AC
  Administered 2015-02-21: 4 mg via INTRAVENOUS
  Filled 2015-02-21: qty 2

## 2015-02-21 MED ORDER — IOHEXOL 300 MG/ML  SOLN
100.0000 mL | Freq: Once | INTRAMUSCULAR | Status: AC | PRN
  Administered 2015-02-21: 100 mL via INTRAVENOUS

## 2015-02-21 NOTE — ED Notes (Signed)
Bed: AS50 Expected date:  Expected time:  Means of arrival:  Comments: EMS 64 yo nausea and vomiting after eating pizza

## 2015-02-21 NOTE — ED Notes (Signed)
Patient is aware we need urine 

## 2015-02-21 NOTE — ED Provider Notes (Signed)
CSN: [id number]     Arrival date & time 02/21/15  0047 History   First MD Initiated Contact with Patient 02/21/15 0132     Chief Complaint  Patient presents with  . Abdominal Pain  . Nausea     (Consider location/radiation/quality/duration/timing/severity/associated sxs/prior Treatment) HPI Patient presents to the emergency department with upper abdominal pain that started after eating pizza and having 2 beers.  The patient states that he has nausea but no vomiting.  Patient states he attempted to make himself vomit was unsuccessful.  Patient states he did have a bowel movement which did not seem to relieve his symptoms.  Patient states he has hepatitis C and has chronic liver disease as fever, vomiting, diarrhea, weakness, dizziness, headache, blurred vision, back pain, dysuria, incontinence, chest pain, shortness of breath, rash, lightheadedness, near syncope or syncope.  The patient states that palpation makes the pain worse.  Nothing seems make the pain better.  Patient denies taking any medications prior to arrival Past Medical History  Diagnosis Date  . Depression   . Hepatitis C     a. s/p [name] ~ 2003 in [place], [place] => stopped after 6 mos due to neutropenia - viral load noted to be low at that time;  no follow up since;   b. abdominal u/s 11/13:  diffuse hepatic steatosis and/or hepatocellular disease    . ETOH abuse   . Tobacco abuse   . CAD (coronary artery disease) 11/13    a. 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 Hx of echocardiogram     a. Echo 01/06/12: Mild focal basal hypertrophy the septum, vigorous LV, EF 65-70%, Gr 2 diast dysfn, trivial AI, MAC, trivial MR, trivial TR, PASP 36  . Thrombocytopenia (HCC)   . Non-STEMI (non-ST elevated myocardial infarction)  (HCC)   . Hepatic steatosis   . Heart attack [facility])    Past Surgical History  Procedure Laterality Date  . Eye surgery    . Left heart catheterization with coronary angiogram N/A 01/05/2012    Procedure: LEFT HEART CATHETERIZATION WITH CORONARY ANGIOGRAM;  Surgeon: Laurey Morale, MD;  Location: [facility] C[facility] LAB;  Service: Cardiovascular;  Laterality: N/A;   Family History  Problem Relation Age of Onset  . Heart attack Mother 33  . Diabetes Mother   . Diabetes Maternal Grandfather   . Heart disease Maternal Grandfather    Social History  Substance Use Topics  . Smoking status: Light Tobacco Smoker -- 0.10 packs/day    Types: Cigarettes  . Smokeless tobacco: Never Used     Comment: almost quit, very rare, 1-2 daily  . Alcohol Use: 0.0 oz/week    0 Standard drinks or equivalent per week     Comment: 2-3 beers on weekends, previous 6-8 beers daily. Patient stopped drinking 5 months prior 05/27/13    Review of Systems  All other systems negative except as documented in the HPI. All pertinent positives and negatives as reviewed in the HPI.  Allergies  Review of patient's allergies indicates no known allergies.  Home Medications   Prior to Admission medications   Medication Sig Start Date End Date Taking? Authorizing Provider  aspirin EC 81 MG tablet Take 81-162 mg by mouth daily.     Historical Provider, MD  beclomethasone (QVAR)  40 MCG/ACT inhaler Inhale 1 puff into the lungs 2 (two) times daily. 06/26/14   Sheliah Hatch, MD  citalopram (CELEXA) 40 MG tablet Take 1 tablet (40 mg total) by mouth daily. 06/17/14   Earney Navy, NP  Ipratropium-Albuterol (COMBIVENT) 20-100 MCG/ACT AERS respimat Inhale 1 puff into the lungs every 6 (six) hours. 06/26/14   Sheliah Hatch, MD  lisinopril (PRINIVIL,ZESTRIL) 5 MG tablet Take 1 tablet (5 mg total) by mouth daily. 06/26/14   Sheliah Hatch, MD  metoprolol tartrate (LOPRESSOR) 25 MG tablet Take 1 tablet (25 mg total) by mouth 2  (two) times daily. 06/26/14   Sheliah Hatch, MD  Multiple Vitamin (MULTIVITAMIN) tablet Take 1 tablet by mouth daily.    Historical Provider, MD  nitroGLYCERIN (NITROSTAT) 0.4 MG SL tablet Place 0.4 mg under the tongue every 5 (five) minutes as needed for chest pain.    Historical Provider, MD  Sofosbuvir-Velpatasvir (EPCLUSA) 400-100 MG TABS Take 1 tablet by mouth daily. Patient not taking: Reported on 01/11/2015 12/15/14   Gardiner Barefoot, MD   BP 160/81 mmHg  Pulse 59  Temp(Src) 97.3 F (36.3 C) (Oral)  Resp 16  SpO2 98% Physical Exam  Constitutional: He is oriented to person, place, and time. He appears well-developed and well-nourished. No distress.  HENT:  Head: Normocephalic and atraumatic.  Mouth/Throat: Oropharynx is clear and moist.  Eyes: Pupils are equal, round, and reactive to light.  Neck: Normal range of motion. Neck supple.  Cardiovascular: Normal rate, regular rhythm and normal heart sounds.  Exam reveals no gallop and no friction rub.   No murmur heard. Pulmonary/Chest: Effort normal and breath sounds normal. No respiratory distress. He has no wheezes.  Abdominal: Soft. Normal appearance and bowel sounds are normal. He exhibits no distension. There is hepatomegaly. There is tenderness in the right upper quadrant and epigastric area. There is no rigidity, no rebound, no guarding, no tenderness at McBurney's point and negative Murphy's sign.  Neurological: He is alert and oriented to person, place, and time. He exhibits normal muscle tone. Coordination normal.  Skin: Skin is warm and dry. No rash noted. No erythema.  Psychiatric: He has a normal mood and affect. His behavior is normal.  Nursing note and vitals reviewed.   ED Course  Procedures (including critical care time) Labs Review Labs Reviewed  COMPREHENSIVE METABOLIC PANEL - Abnormal; Notable for the following:    Chloride 99 (*)    Glucose, Bld 137 (*)    AST 141 (*)    ALT 134 (*)    Total Bilirubin  1.5 (*)    All other components within normal limits  CBC WITH DIFFERENTIAL/PLATELET - Abnormal; Notable for the following:    RBC 4.15 (*)    MCV 100.5 (*)    MCH 35.7 (*)    Platelets 85 (*)    All other components within normal limits  URINALYSIS, ROUTINE W REFLEX MICROSCOPIC (NOT AT [facility]) - Abnormal; Notable for the following:    Color, Urine AMBER (*)    APPearance CLOUDY (*)    Bilirubin Urine SMALL (*)    Ketones, ur 15 (*)    Leukocytes, UA SMALL (*)    All other components within normal limits  URINE MICROSCOPIC-ADD ON - Abnormal; Notable for the following:    Squamous Epithelial / LPF 0-5 (*)    Bacteria, UA FEW (*)    Crystals CA OXALATE CRYSTALS (*)    All other components within normal limits  LIPASE, BLOOD    Imaging Review Ct Abdomen Pelvis W Contrast  02/21/2015  CLINICAL DATA:  64 year old male with abdominal pain, nausea and vomiting. History of hepatitis Celsius and hepatic steatosis. EXAM: CT ABDOMEN AND PELVIS WITH CONTRAST TECHNIQUE: Multidetector CT imaging of the abdomen and pelvis was performed using the standard protocol following bolus administration of intravenous contrast. CONTRAST:  OMNIPAQUE IOHEXOL 300 MG/ML  SOLN COMPARISON:  CT dated 07/16/2011 and ultrasound dated 12/14/2014 FINDINGS: The visualized lung bases are clear. Minimal bibasilar dependent atelectatic changes noted. There is coronary vascular calcification. No intra-abdominal free air or free fluid. There is hepatic contour nodularity compatible with underlying cirrhosis. A focal ill-defined area of slight enhancement in the arterial phase in the medial segment of the left lobe of liver adjacent to the gallbladder fossa likely represents vascular shunting. The gallbladder is distended. There is apparent thickening of the proximal wall of the gallbladder with trace pericholecystic fluid. No calcified stone identified. Ultrasound is recommended for better evaluation of the gallbladder. The  pancreas, spleen, adrenal glands, kidneys, visualized ureters, and urinary bladder appear unremarkable. The prostate and seminal vesicles are grossly unremarkable. There is extensive sigmoid diverticulosis with muscular hypertrophy. No definite active inflammation. There is no evidence of bowel obstruction. Normal appendix. There is aortoiliac atherosclerotic disease. No portal venous gas identified. There is no adenopathy. The abdominal wall soft tissues appear unremarkable. There is degenerative changes of the spine. No acute fracture. IMPRESSION: Mildly distended gallbladder with small pericholecystic fluid. Ultrasound is recommended for better evaluation of the gallbladder. Cirrhosis. Focal ill-defined enhancement along the gallbladder fossa in the left lobe of the liver likely represents vascular shunting. MRI may provide better evaluation. Sigmoid diverticulosis. No evidence of bowel obstruction or active inflammation. Normal appendix. Electronically Signed   By: Elgie Collard M.D.   On: 02/21/2015 04:09   I have personally reviewed and evaluated these images and lab results as part of my medical decision-making.   EKG Interpretation None      Patient's CT scan shows mildly distended gallbladder and small amount of pericholecystic fluid.  Ultrasound will be performed to further delineate this issue.  Patient has a distended gallbladder and that could be removed related to his pain.  Patient will be advised follow-up with his GI doctor.  We will order an outpatient HIDA scan.  Also, placed on antibiotics   Charlestine Night, PA-C 02/21/15 1610  Devoria Albe, MD 02/21/15 [phone]

## 2015-02-21 NOTE — ED Notes (Addendum)
Per EMS, patient is from home.  Patient complains of having abdominal pain associated with nausea after eating pizza earlier today.  History of substance abuse and alcohol. Patient states he smoked crack today at 3 pm and 6 pm, he said not sure if this is related. BP:157/83 P:60's R:22  CBG:145

## 2015-02-21 NOTE — Discharge Instructions (Signed)
Return here as needed.  Follow-up with your GI doctor.  Avoid foods with high fat content, such as pizza

## 2015-03-07 ENCOUNTER — Other Ambulatory Visit: Payer: Self-pay | Admitting: Internal Medicine

## 2015-03-07 MED ORDER — LEDIPASVIR-SOFOSBUVIR 90-400 MG PO TABS: 1.0000 | ORAL_TABLET | Freq: Every day | ORAL | Status: DC

## 2015-03-08 ENCOUNTER — Other Ambulatory Visit: Payer: Self-pay | Admitting: Pharmacist Clinician (PhC)/ Clinical Pharmacy Specialist

## 2015-03-08 MED ORDER — ELBASVIR-GRAZOPREVIR 50-100 MG PO TABS: 1.0000 | ORAL_TABLET | Freq: Every day | ORAL | Status: DC

## 2015-03-14 ENCOUNTER — Other Ambulatory Visit: Payer: Self-pay | Admitting: Pharmacist Clinician (PhC)/ Clinical Pharmacy Specialist

## 2015-03-14 MED ORDER — LEDIPASVIR-SOFOSBUVIR 90-400 MG PO TABS: 1.0000 | ORAL_TABLET | Freq: Every day | ORAL | Status: DC

## 2015-03-16 ENCOUNTER — Other Ambulatory Visit: Payer: Self-pay | Admitting: Pharmacist Clinician (PhC)/ Clinical Pharmacy Specialist

## 2015-03-16 MED ORDER — LEDIPASVIR-SOFOSBUVIR 90-400 MG PO TABS: 1.0000 | ORAL_TABLET | Freq: Every day | ORAL | Status: DC

## 2015-03-20 MED FILL — *HARVONI 90-400 MG TABLET: 90-400 | 28 days supply | Qty: 28 | Fill #0

## 2015-03-26 ENCOUNTER — Encounter: Payer: Self-pay | Admitting: Pharmacy Technician

## 2015-04-09 ENCOUNTER — Encounter: Payer: Self-pay | Admitting: Family Medicine

## 2015-04-09 ENCOUNTER — Ambulatory Visit (INDEPENDENT_AMBULATORY_CARE_PROVIDER_SITE_OTHER): Payer: Medicare Other | Admitting: Family Medicine

## 2015-04-09 VITALS — BP 140/80 | HR 70 | Temp 98.0°F | Ht 71.0 in | Wt 148.2 lb

## 2015-04-09 DIAGNOSIS — I10 Essential (primary) hypertension: Secondary | ICD-10-CM | POA: Diagnosis not present

## 2015-04-09 DIAGNOSIS — F329 Major depressive disorder, single episode, unspecified: Secondary | ICD-10-CM | POA: Diagnosis not present

## 2015-04-09 DIAGNOSIS — F32A Depression, unspecified: Secondary | ICD-10-CM

## 2015-04-09 DIAGNOSIS — B182 Chronic viral hepatitis C: Secondary | ICD-10-CM | POA: Diagnosis not present

## 2015-04-09 LAB — BASIC METABOLIC PANEL
BUN: 17 mg/dL (ref 6–23)
CO2: 32 mEq/L (ref 19–32)
Calcium: 9.6 mg/dL (ref 8.4–10.5)
Chloride: 102 mEq/L (ref 96–112)
Creatinine, Ser: 1.02 mg/dL (ref 0.40–1.50)
GFR: 77.69 mL/min (ref >60.00)
Glucose, Bld: 105 mg/dL — ABNORMAL HIGH (ref 70–99)
Potassium: 4.7 mEq/L (ref 3.5–5.1)
Sodium: 137 mEq/L (ref 135–145)

## 2015-04-09 LAB — CBC WITH DIFFERENTIAL/PLATELET
Basophils Absolute: 0 10*3/uL (ref 0.0–0.1)
Basophils Relative: 0.8 % (ref 0.0–3.0)
Eosinophils Absolute: 0.2 10*3/uL (ref 0.0–0.7)
Eosinophils Relative: 3.6 % (ref 0.0–5.0)
HCT: 45.9 % (ref 39.0–52.0)
Hemoglobin: 15.5 g/dL (ref 13.0–17.0)
Lymphocytes Relative: 41 % (ref 12.0–46.0)
Lymphs Abs: 2.1 10*3/uL (ref 0.7–4.0)
MCHC: 33.7 g/dL (ref 30.0–36.0)
MCV: 103.5 fl — ABNORMAL HIGH (ref 78.0–100.0)
Monocytes Absolute: 0.5 10*3/uL (ref 0.1–1.0)
Monocytes Relative: 8.7 % (ref 3.0–12.0)
Neutro Abs: 2.4 10*3/uL (ref 1.4–7.7)
Neutrophils Relative %: 45.9 % (ref 43.0–77.0)
Platelets: 87 10*3/uL — ABNORMAL LOW (ref 150.0–400.0)
RBC: 4.44 Mil/uL (ref 4.22–5.81)
RDW: 15.4 % (ref 11.5–15.5)
WBC: 5.2 10*3/uL (ref 4.0–10.5)

## 2015-04-09 NOTE — Assessment & Plan Note (Signed)
Pt is now seeing Dr Luciana Axe and has started Harvoni.  He is tolerating this w/o difficulty and I applauded his follow through.  Will follow along w/ ID and assist as able.

## 2015-04-09 NOTE — Patient Instructions (Signed)
Schedule your complete physical in 6 months We'll notify you of your lab results and make any changes if needed Keep up the good work on quitting smoking- you're doing it!!! Call with any questions or concerns If you want to join [place] at the new [place] office, any scheduled appointments will automatically transfer and we will see you at [address] [place] [address] [place], [place] [address] [facility]) Happy Valentine's Day!!!

## 2015-04-09 NOTE — Progress Notes (Signed)
   Subjective:    Patient ID: Hunter Mcmillan, male    DOB: [birth year], 65 y.o.   MRN: [id number]  HPI HTN- chronic problem, on Lisinopril, Metoprolol.  No CP, SOB, HAs, visual changes, edema.  Hep C- pt started Harvoni 3 weeks ago.  Notes mild drowsiness.  Has further decreased smoking since starting medication.  Denies abd pain, N/V.  Depression- chronic problem, on Celexa w/ good control of sxs.  Pt continues to be sober.  Denies SI, HI.  Review of Systems For ROS see HPI     Objective:   Physical Exam  Constitutional: He is oriented to person, place, and time. He appears well-developed and well-nourished. No distress.  HENT:  Head: Normocephalic and atraumatic.  Eyes: Conjunctivae and EOM are normal. Pupils are equal, round, and reactive to light.  Neck: Normal range of motion. Neck supple. No thyromegaly present.  Cardiovascular: Normal rate, regular rhythm, normal heart sounds and intact distal pulses.   No murmur heard. Pulmonary/Chest: Effort normal and breath sounds normal. No respiratory distress.  Abdominal: Soft. Bowel sounds are normal. He exhibits no distension.  Musculoskeletal: He exhibits no edema.  Lymphadenopathy:    He has no cervical adenopathy.  Neurological: He is alert and oriented to person, place, and time. No cranial nerve deficit.  Skin: Skin is warm and dry.  Psychiatric: He has a normal mood and affect. His behavior is normal.  Vitals reviewed.         Assessment & Plan:

## 2015-04-09 NOTE — Assessment & Plan Note (Signed)
Chronic problem.  Pt reports sxs are currently well controlled on Celexa.  Is continuing to work on his sobriety.  'My friends tell me they like me more'.  No med changes at this time.  Will continue to follow.

## 2015-04-09 NOTE — Progress Notes (Signed)
Pre visit review using our clinic review tool, if applicable. No additional management support is needed unless otherwise documented below in the visit note. 

## 2015-04-09 NOTE — Assessment & Plan Note (Signed)
Chronic problem.  Adequate control.  Asymptomatic.  Check labs.  No anticipated med changes.  Will follow. 

## 2015-04-10 ENCOUNTER — Encounter: Payer: Self-pay | Admitting: General Practice

## 2015-04-11 ENCOUNTER — Ambulatory Visit: Payer: Medicare Other | Admitting: Pharmacist Clinician (PhC)/ Clinical Pharmacy Specialist

## 2015-04-11 DIAGNOSIS — B182 Chronic viral hepatitis C: Secondary | ICD-10-CM

## 2015-04-11 NOTE — Progress Notes (Signed)
HPI: Hunter Mcmillan is a 65 y.o. male who is here for the pharmacy f/u of his hep C.   Lab Results  Component Value Date   HCVGENOTYPE 1a 10/23/2014    Allergies: No Known Allergies  Vitals:    Past Medical History: Past Medical History  Diagnosis Date  . Depression   . Hepatitis C     a. s/p [name] ~ 2003 in [place], [place] => stopped after 6 mos due to neutropenia - viral load noted to be low at that time;  no follow up since;   b. abdominal u/s 11/13:  diffuse hepatic steatosis and/or hepatocellular disease    . ETOH abuse   . Tobacco abuse   . CAD (coronary artery disease) 11/13    a. 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 Hx of echocardiogram     a. Echo 01/06/12: Mild focal basal hypertrophy the septum, vigorous LV, EF 65-70%, Gr 2 diast dysfn, trivial AI, MAC, trivial MR, trivial TR, PASP 36  . Thrombocytopenia (HCC)   . Non-STEMI (non-ST elevated myocardial infarction) (HCC)   . Hepatic steatosis   . Heart attack [facility])     Social History: Social History   Social History  . Marital Status: Single    Spouse Name: N/A  . Number of Children: 1  . Years of Education: N/A   Occupational History  . Curator     retired   Social History Main Topics  . Smoking status: Light Tobacco Smoker -- 0.10 packs/day    Types: Cigarettes  . Smokeless tobacco: Never Used     Comment: almost quit, very rare, 1-2 daily  . Alcohol Use: 0.0 oz/week    0 Standard drinks or equivalent per week     Comment: 2-3 beers on weekends, previous 6-8 beers daily. Patient stopped drinking 5 months prior 05/27/13  . Drug Use: Yes    Special: Cocaine     Comment: history of cocaine last use 2014  . Sexual Activity:    Partners: Female     Comment: pt. declined condoms   Other  Topics Concern  . Not on file   Social History Narrative    Labs: HEP B S AB (no units)  Date Value  10/23/2014 POS*   HEPATITIS B SURFACE AG (no units)  Date Value  10/23/2014 NEGATIVE    Lab Results  Component Value Date   HCVGENOTYPE 1a 10/23/2014    Hepatitis C RNA quantitative Latest Ref Rng 10/23/2014 09/17/2012  HCV Quantitative <15 IU/mL 3244010(U) 1724128(H)  HCV Quantitative Log <1.18 log 10 6.31(H) 6.24(H)    AST (U/L)  Date Value  02/21/2015 141*  10/05/2014 195*  06/26/2014 116*   ALT (U/L)  Date Value  02/21/2015 134*  10/05/2014 199*  06/26/2014 112*   INR (no units)  Date Value  10/23/2014 1.19  01/05/2012 1.06    CrCl: Estimated Creatinine Clearance: 68.6 mL/min (by C-G formula based on Cr of 1.02).  Fibrosis Score: F3/4 as assessed by ARFI  Child-Pugh Score: Class A  Previous Treatment Regimen: Peg/riba  Assessment: Faysal presented today for his 3 wks f/u of starting hep C treatment. He started on 1/20 and has not missed any doses. He did not have coverage at first but got coverage at the beginning  of this year. We tried to get Zepatier since it can be used for 12 wks but it was not part of his plan.  We are going to use Harvoni x 6 mo instead due to the anemia issue when he was on PEG/Riba. He has some occasional headaches with Harvoni but not severe. He takes is with dinner everyday along with his other meds. Told him to separate out the MVI to morning to prevent any interactions with the Harvoni. He is ok with doing that. He is going to come back here in 1 week for labs and see Dr. Luciana Axe in April.   Recommendations:  Cont Harvoni x 6 months Hep C VL and CBC in 1 week F/u Dr. Luciana Axe in April  Suresh Audi [place], [place].D., BCPS, AAHIVP Clinical Infectious Disease Pharmacist [facility] 04/11/2015, 10:15 AM

## 2015-04-11 NOTE — Patient Instructions (Addendum)
Continue to take Harvoni for a total of 6 months. Come back for labs next week and Dr.Comer in April

## 2015-04-16 MED FILL — *HARVONI 90-400 MG TABLET: 90-400 | 28 days supply | Qty: 28 | Fill #1

## 2015-04-17 NOTE — Telephone Encounter (Signed)
He has gotten med.Marland KitchenMarland Kitchenpurchased insurance.

## 2015-04-18 ENCOUNTER — Other Ambulatory Visit: Payer: Self-pay

## 2015-04-20 ENCOUNTER — Other Ambulatory Visit: Payer: Medicare Other

## 2015-04-20 DIAGNOSIS — B182 Chronic viral hepatitis C: Secondary | ICD-10-CM

## 2015-04-20 LAB — CBC
HCT: 44.1 % (ref 39.0–52.0)
Hemoglobin: 15.1 g/dL (ref 13.0–17.0)
MCH: 35.2 pg — ABNORMAL HIGH (ref 26.0–34.0)
MCHC: 34.2 g/dL (ref 30.0–36.0)
MCV: 102.8 fL — ABNORMAL HIGH (ref 78.0–100.0)
MPV: 10 fL (ref 8.6–12.4)
Platelets: 101 10*3/uL — ABNORMAL LOW (ref 150–400)
RBC: 4.29 MIL/uL (ref 4.22–5.81)
RDW: 14.1 % (ref 11.5–15.5)
WBC: 7 10*3/uL (ref 4.0–10.5)

## 2015-04-23 LAB — HEPATITIS C RNA QUANTITATIVE: HCV Quantitative: NOT DETECTED IU/mL (ref <15)

## 2015-05-14 MED FILL — *HARVONI 90-400 MG TABLET: 90-400 | 28 days supply | Qty: 28 | Fill #2

## 2015-05-18 ENCOUNTER — Other Ambulatory Visit: Payer: Self-pay | Admitting: Family Medicine

## 2015-05-18 IMAGING — CR DG CHEST 2V
2 series · 2 of 2 positions shown · non-contrast
Comparison: 05/09/2014

CLINICAL DATA: ETOH abuse

EXAM:
CHEST  2 VIEW

[w chest pa]
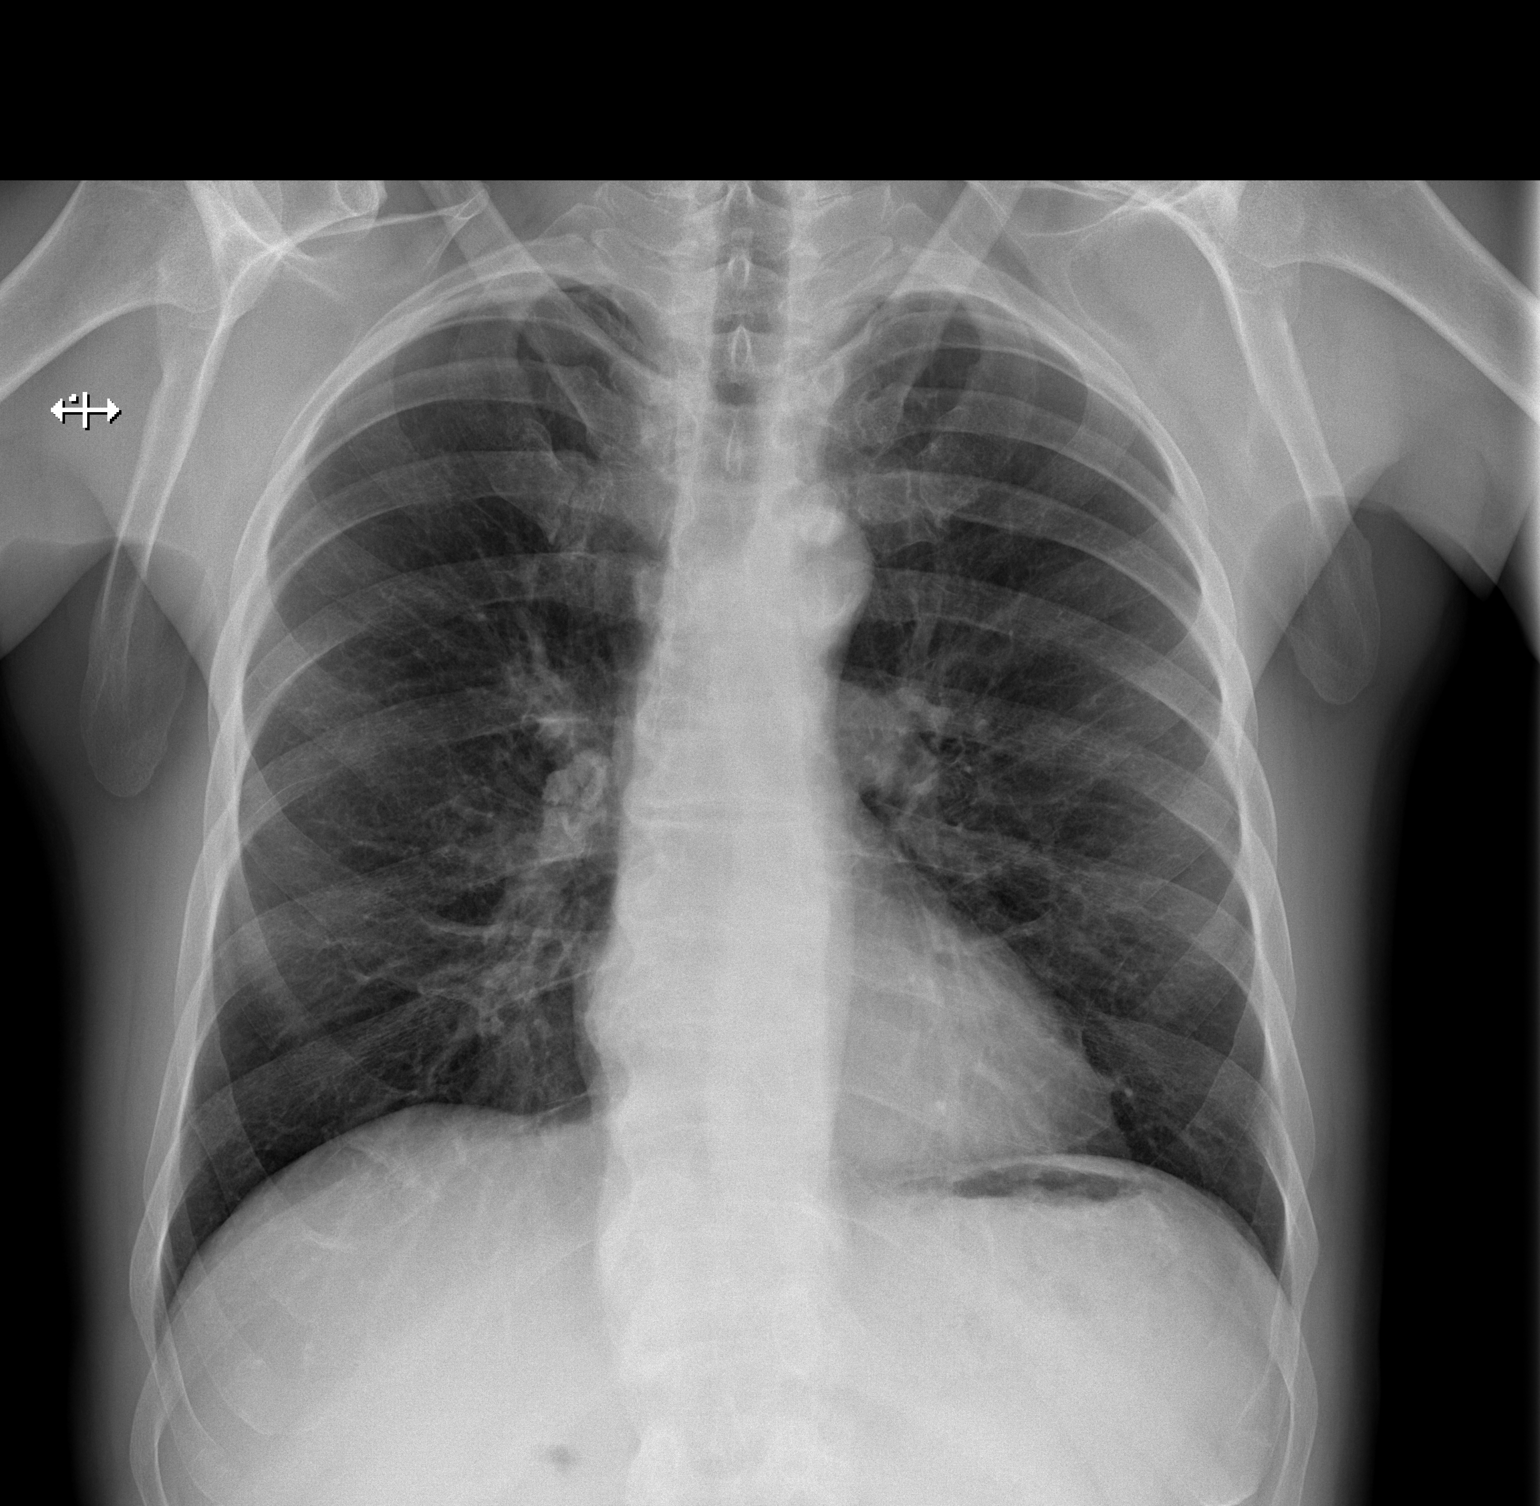

[w chest lat]
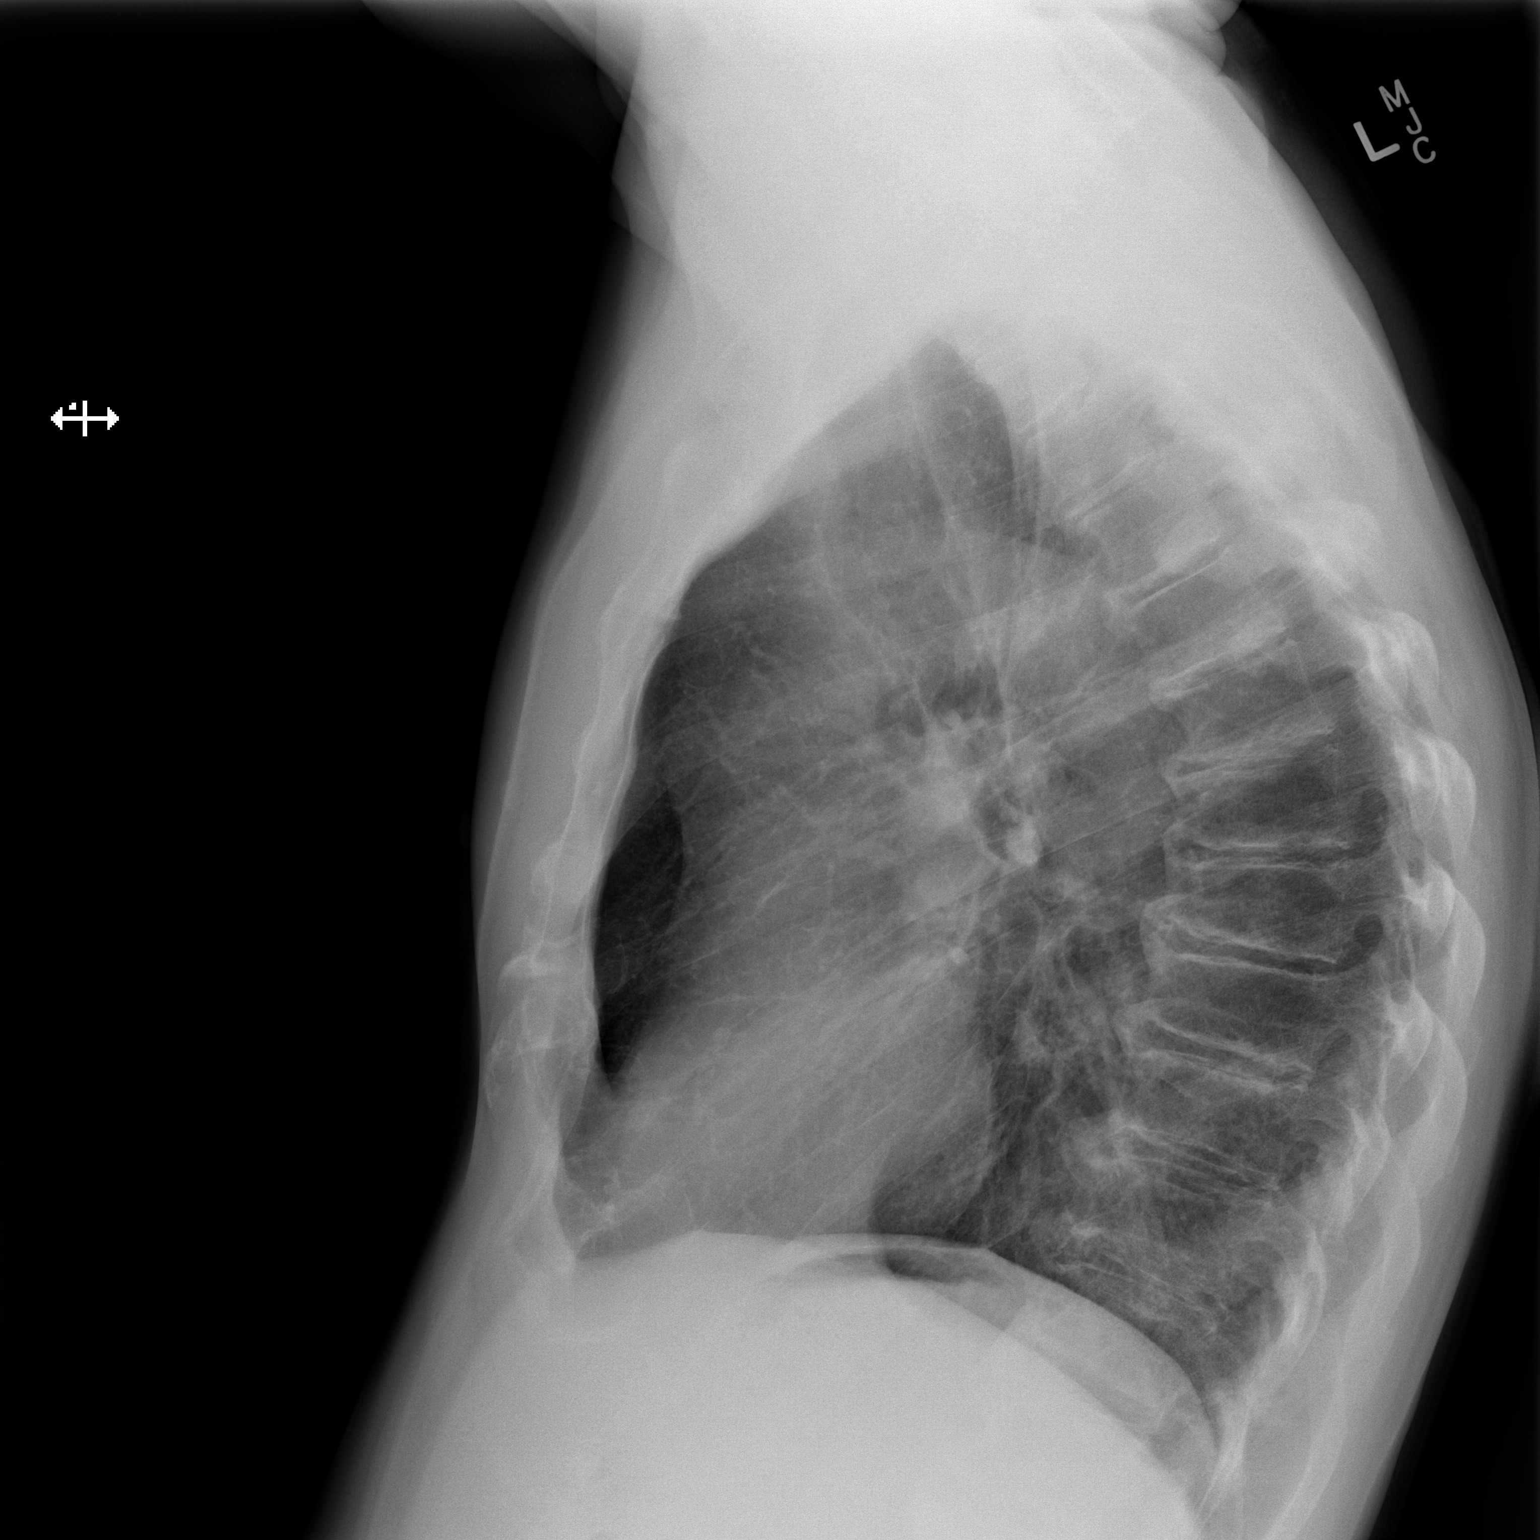

[2 of 2 positions shown; findings below may reference images not displayed]

FINDINGS: Lungs are clear.  No pleural effusion or pneumothorax.

The heart is normal in size.

Degenerative changes of the visualized thoracolumbar spine.
IMPRESSION: No evidence of acute cardiopulmonary disease.

## 2015-05-18 NOTE — Telephone Encounter (Signed)
Medication filled to pharmacy as requested.   

## 2015-06-21 ENCOUNTER — Ambulatory Visit: Payer: Self-pay | Admitting: Internal Medicine

## 2015-06-22 MED FILL — *HARVONI 90-400 MG TABLET: 90-400 | 28 days supply | Qty: 28 | Fill #3

## 2015-07-02 DIAGNOSIS — R509 Fever, unspecified: Secondary | ICD-10-CM | POA: Diagnosis not present

## 2015-07-02 DIAGNOSIS — Z720 Tobacco use: Secondary | ICD-10-CM | POA: Diagnosis not present

## 2015-07-02 DIAGNOSIS — R05 Cough: Secondary | ICD-10-CM | POA: Diagnosis not present

## 2015-07-02 DIAGNOSIS — J40 Bronchitis, not specified as acute or chronic: Secondary | ICD-10-CM | POA: Diagnosis not present

## 2015-07-02 DIAGNOSIS — J3489 Other specified disorders of nose and nasal sinuses: Secondary | ICD-10-CM | POA: Diagnosis not present

## 2015-07-19 MED FILL — *HARVONI 90-400 MG TABLET: 90-400 | 28 days supply | Qty: 28 | Fill #4

## 2015-07-23 ENCOUNTER — Encounter: Payer: Self-pay | Admitting: Internal Medicine

## 2015-07-23 ENCOUNTER — Ambulatory Visit (INDEPENDENT_AMBULATORY_CARE_PROVIDER_SITE_OTHER): Payer: Medicare Other | Admitting: Internal Medicine

## 2015-07-23 VITALS — BP 139/79 | HR 72 | Temp 97.3°F | Wt 138.0 lb

## 2015-07-23 DIAGNOSIS — B182 Chronic viral hepatitis C: Secondary | ICD-10-CM | POA: Diagnosis not present

## 2015-07-23 DIAGNOSIS — K7460 Unspecified cirrhosis of liver: Secondary | ICD-10-CM

## 2015-07-23 DIAGNOSIS — F1020 Alcohol dependence, uncomplicated: Secondary | ICD-10-CM | POA: Diagnosis not present

## 2015-07-23 NOTE — Assessment & Plan Note (Signed)
Continuing on treatment.  Will check virus again today and rtc 3 months for end of treatment lab.

## 2015-07-23 NOTE — Assessment & Plan Note (Signed)
Continues to be alcohol free 

## 2015-07-23 NOTE — Assessment & Plan Note (Addendum)
Will refer to GI for EGD.  Will do HCC screening now and every 6 months. Child-Pugh A.

## 2015-07-23 NOTE — Progress Notes (Signed)
   Subjective:    Patient ID: Hunter Mcmillan, male    DOB: 1951/01/23, 65 y.o.   MRN: [id number]  HPI Here for follow up of HCV.  He has genotype 1a and was previously treated for 6 month with Peg/riba but stopped due to signfiicant neutropenia.  His virus relapsed and was started on 6 months of Harvoni without ribavirin.  He also has cirrhosis noted on elastography of F3/4 and gross cirrhosis noted on CT and ultrasound.  He continues to be alcohol free and feels well.  Continuing to cut his smoking down.     Review of Systems  Constitutional: Positive for fatigue.  Gastrointestinal: Negative for diarrhea.  Skin: Negative for rash.  Neurological: Negative for dizziness and headaches.       Objective:   Physical Exam  Constitutional: He appears well-developed and well-nourished. No distress.  Eyes: No scleral icterus.  Cardiovascular: Normal rate, regular rhythm and normal heart sounds.   No murmur heard. Pulmonary/Chest: Effort normal and breath sounds normal.  Lymphadenopathy:    He has no cervical adenopathy.  Skin: No rash noted.   Social History   Social History  . Marital Status: Single    Spouse Name: N/A  . Number of Children: 1  . Years of Education: N/A   Occupational History  . Curator     retired   Social History Main Topics  . Smoking status: Light Tobacco Smoker -- 0.10 packs/day    Types: Cigarettes  . Smokeless tobacco: Never Used     Comment: almost quit, very rare, 1-2 daily  . Alcohol Use: 0.0 oz/week    0 Standard drinks or equivalent per week     Comment: 2-3 beers on weekends, previous 6-8 beers daily. Patient stopped drinking 5 months prior 05/27/13  . Drug Use: Yes    Special: Cocaine     Comment: history of cocaine last use 2014  . Sexual Activity:    Partners: Female     Comment: pt. declined condoms   Other Topics Concern  . Not on file   Social History Narrative    No alcohol now over 1 year.       Assessment & Plan:

## 2015-07-25 LAB — HEPATITIS C RNA QUANTITATIVE: HCV Quantitative: NOT DETECTED IU/mL (ref <15)

## 2015-08-15 ENCOUNTER — Telehealth: Payer: Self-pay | Admitting: Pharmacy Technician

## 2015-08-15 NOTE — Telephone Encounter (Signed)
Left a voicemail and email for Hunter Mcmillan, and spoke directly with his father, Hunter Mcmillan.  There are the last 2 months (of a 6 month regimen) of Hep C medication at [facility], ready for pick up.

## 2015-09-24 ENCOUNTER — Encounter: Payer: Self-pay | Admitting: Internal Medicine

## 2015-10-08 ENCOUNTER — Ambulatory Visit (INDEPENDENT_AMBULATORY_CARE_PROVIDER_SITE_OTHER): Payer: Medicare Other | Admitting: Family Medicine

## 2015-10-08 ENCOUNTER — Encounter: Payer: Self-pay | Admitting: General Practice

## 2015-10-08 ENCOUNTER — Encounter: Payer: Self-pay | Admitting: Family Medicine

## 2015-10-08 VITALS — BP 140/85 | HR 54 | Temp 98.1°F | Resp 16 | Ht 71.0 in | Wt 146.1 lb

## 2015-10-08 DIAGNOSIS — B182 Chronic viral hepatitis C: Secondary | ICD-10-CM

## 2015-10-08 DIAGNOSIS — J449 Chronic obstructive pulmonary disease, unspecified: Secondary | ICD-10-CM | POA: Diagnosis not present

## 2015-10-08 DIAGNOSIS — K7460 Unspecified cirrhosis of liver: Secondary | ICD-10-CM | POA: Diagnosis not present

## 2015-10-08 DIAGNOSIS — N509 Disorder of male genital organs, unspecified: Secondary | ICD-10-CM

## 2015-10-08 DIAGNOSIS — I10 Essential (primary) hypertension: Secondary | ICD-10-CM | POA: Diagnosis not present

## 2015-10-08 DIAGNOSIS — Z0000 Encounter for general adult medical examination without abnormal findings: Secondary | ICD-10-CM | POA: Diagnosis not present

## 2015-10-08 DIAGNOSIS — Z125 Encounter for screening for malignant neoplasm of prostate: Secondary | ICD-10-CM | POA: Diagnosis not present

## 2015-10-08 DIAGNOSIS — D696 Thrombocytopenia, unspecified: Secondary | ICD-10-CM | POA: Diagnosis not present

## 2015-10-08 DIAGNOSIS — N5089 Other specified disorders of the male genital organs: Secondary | ICD-10-CM

## 2015-10-08 LAB — BASIC METABOLIC PANEL
BUN: 14 mg/dL (ref 6–23)
CO2: 32 mEq/L (ref 19–32)
Calcium: 9.8 mg/dL (ref 8.4–10.5)
Chloride: 103 mEq/L (ref 96–112)
Creatinine, Ser: 1.03 mg/dL (ref 0.40–1.50)
GFR: 76.7 mL/min (ref >60.00)
Glucose, Bld: 89 mg/dL (ref 70–99)
Potassium: 4.5 mEq/L (ref 3.5–5.1)
Sodium: 139 mEq/L (ref 135–145)

## 2015-10-08 LAB — CBC WITH DIFFERENTIAL/PLATELET
Basophils Absolute: 0 10*3/uL (ref 0.0–0.1)
Basophils Relative: 0.7 % (ref 0.0–3.0)
Eosinophils Absolute: 0.2 10*3/uL (ref 0.0–0.7)
Eosinophils Relative: 3 % (ref 0.0–5.0)
HCT: 46 % (ref 39.0–52.0)
Hemoglobin: 15.5 g/dL (ref 13.0–17.0)
Lymphocytes Relative: 37.7 % (ref 12.0–46.0)
Lymphs Abs: 2.4 10*3/uL (ref 0.7–4.0)
MCHC: 33.6 g/dL (ref 30.0–36.0)
MCV: 101.9 fl — ABNORMAL HIGH (ref 78.0–100.0)
Monocytes Absolute: 0.5 10*3/uL (ref 0.1–1.0)
Monocytes Relative: 7.4 % (ref 3.0–12.0)
Neutro Abs: 3.3 10*3/uL (ref 1.4–7.7)
Neutrophils Relative %: 51.2 % (ref 43.0–77.0)
Platelets: 138 10*3/uL — ABNORMAL LOW (ref 150.0–400.0)
RBC: 4.52 Mil/uL (ref 4.22–5.81)
RDW: 14.7 % (ref 11.5–15.5)
WBC: 6.4 10*3/uL (ref 4.0–10.5)

## 2015-10-08 LAB — HEPATIC FUNCTION PANEL
ALT: 11 U/L (ref 0–53)
AST: 18 U/L (ref 0–37)
Albumin: 4.1 g/dL (ref 3.5–5.2)
Alkaline Phosphatase: 143 U/L — ABNORMAL HIGH (ref 39–117)
Bilirubin, Direct: 0.1 mg/dL (ref 0.0–0.3)
Total Bilirubin: 0.3 mg/dL (ref 0.2–1.2)
Total Protein: 7.7 g/dL (ref 6.0–8.3)

## 2015-10-08 LAB — LIPID PANEL
Cholesterol: 160 mg/dL (ref 0–200)
HDL: 28.9 mg/dL — ABNORMAL LOW (ref >39.00)
LDL Cholesterol: 112 mg/dL — ABNORMAL HIGH (ref 0–99)
NonHDL: 130.65
Total CHOL/HDL Ratio: 6
Triglycerides: 94 mg/dL (ref 0.0–149.0)
VLDL: 18.8 mg/dL (ref 0.0–40.0)

## 2015-10-08 LAB — PSA, MEDICARE: PSA: 0.78 ng/ml (ref 0.10–4.00)

## 2015-10-08 LAB — TSH: TSH: 0.37 u[IU]/mL (ref 0.35–4.50)

## 2015-10-08 MED ORDER — METOPROLOL TARTRATE 25 MG PO TABS: 25.0000 mg | ORAL_TABLET | Freq: Two times a day (BID) | ORAL | 1 refills | Status: DC

## 2015-10-08 MED ORDER — LISINOPRIL 5 MG PO TABS: 5.0000 mg | ORAL_TABLET | Freq: Every day | ORAL | 1 refills | Status: DC

## 2015-10-08 NOTE — Assessment & Plan Note (Signed)
Chronic problem.  Pt's breathing is better since he stopped smoking.  Only using Qvar prn.  No changes at this time.

## 2015-10-08 NOTE — Assessment & Plan Note (Signed)
Pt's PE unchanged from previous.  He does have R sided scrotal/testicular mass that has not been evaluated recently.  Get [place].  UTD on colonoscopy, immunizations.  Check labs.  Anticipatory guidance provided.

## 2015-10-08 NOTE — Assessment & Plan Note (Signed)
Chronic problem due to chronic alcohol use.  No abnormal bleeding or bruising.  Check labs.

## 2015-10-08 NOTE — Progress Notes (Signed)
Pre visit review using our clinic review tool, if applicable. No additional management support is needed unless otherwise documented below in the visit note. 

## 2015-10-08 NOTE — Patient Instructions (Signed)
Follow up in 4-6 weeks to recheck BP We'll notify you of your lab results and make any changes if needed Restart the Lisinopril and Metoprolol for both blood pressure and the history of the heart attack Start daily claritin or zyrtec for the runny nose You are up to date on colonoscopy until 2022 You are up to date on immunizations- yay! Call with any questions or concerns Enjoy the rest of your summer!!!

## 2015-10-08 NOTE — Assessment & Plan Note (Signed)
Chronic problem.  Currently asymptomatic.  Stressed need to abstain from all alcohol.  Will follow.

## 2015-10-08 NOTE — Assessment & Plan Note (Signed)
Pt has completed tx w/ Dr Luciana Axe.  He is now Hep C free.

## 2015-10-08 NOTE — Assessment & Plan Note (Signed)
Deteriorated.  BP is again elevated since pt stopped ACE and beta blocker.  Pt agreeable to restart once the importance was again reviewed w/ him.  Will follow.

## 2015-10-08 NOTE — Progress Notes (Signed)
   Subjective:    Patient ID: Hunter Mcmillan, male    DOB: [birth year], 65 y.o.   MRN: [id number]  HPI Here today for CPE.  Risk Factors: HTN- chronic problem, pt stopped both Lisinopril and Metoprolol b/c 'i didn't feel the need for it'.  Pt has gained 8 lbs since last visit. COPD- pt quit smoking!  Using Qvar as needed rather than daily. Hep C- chronic problem, cured s/p tx w/ Dr Luciana Axe.  Pt continues to drink small amounts of ETOH Thrombocytopenia- chronic problem, due for repeat labs. Physical Activity: walking daily, 20-30 minutes/day Fall Risk: low Depression: denies active sxs, doing well on Celexa Hearing: normal to conversational tones, mildly decreased to whispered voice ADL's: independent Cognitive: normal linear thought process, memory and attention intact Home Safety: safe at home Height, Weight, BMI, Visual Acuity: see vitals, vision corrected to 20/20 w/ glasses Counseling: UTD on colonoscopy- due 2022.  UTD on immunizations Care team reviewed and updated Labs Ordered: See A&P Care Plan: See A&P    Review of Systems Patient reports no vision/hearing changes, anorexia, fever ,adenopathy, persistant/recurrent hoarseness, swallowing issues, chest pain, palpitations, edema, persistant/recurrent cough, hemoptysis, dyspnea (rest,exertional, paroxysmal nocturnal), gastrointestinal  bleeding (melena, rectal bleeding), abdominal pain, excessive heart burn, GU symptoms (dysuria, hematuria, voiding/incontinence issues) syncope, focal weakness, memory loss, numbness & tingling, skin/hair/nail changes, depression, anxiety, abnormal bruising/bleeding, musculoskeletal symptoms/signs.     Objective:   Physical Exam BP 140/85   Pulse (!) 54   Temp 98.1 F (36.7 C) (Oral)   Resp 16   Ht 5\' 11"  (1.803 m)   Wt 146 lb 2 oz (66.3 kg)   SpO2 95%   BMI 20.38 kg/m   General Appearance:    Alert, cooperative, no distress, appears stated age  Head:    Normocephalic, without obvious  abnormality, atraumatic  Eyes:    PERRL, conjunctiva/corneas clear, EOM's intact, fundi    benign, both eyes       Ears:    Normal TM's and external ear canals, both ears  Nose:   Nares normal, septum midline, mucosa normal, no drainage   or sinus tenderness  Throat:   Lips, mucosa, and tongue normal; gums normal, poor dentition  Neck:   Supple, symmetrical, trachea midline, no adenopathy;       thyroid:  No enlargement/tenderness/nodules  Back:     Symmetric, no curvature, ROM normal, no CVA tenderness  Lungs:     Clear to auscultation bilaterally, respirations unlabored  Chest wall:    No tenderness or deformity  Heart:    Regular rate and rhythm, S1 and S2 normal, no murmur, rub   or gallop  Abdomen:     Soft, non-tender, bowel sounds active all four quadrants,    no masses, no organomegaly  Genitalia:    Normal male without lesion, discharge or tenderness, R sided scrotal/testicular mass  Rectal:    Normal tone, prostate mildly enlarged but no masses or tenderness  Extremities:   Extremities normal, atraumatic, no cyanosis or edema  Pulses:   2+ and symmetric all extremities  Skin:   Skin color, texture, turgor normal, no rashes or lesions  Lymph nodes:   Cervical, supraclavicular, and axillary nodes normal  Neurologic:   CNII-XII intact. Normal strength, sensation and reflexes      throughout          Assessment & Plan:

## 2015-10-09 ENCOUNTER — Other Ambulatory Visit (INDEPENDENT_AMBULATORY_CARE_PROVIDER_SITE_OTHER): Payer: Medicare Other

## 2015-10-09 DIAGNOSIS — R748 Abnormal levels of other serum enzymes: Secondary | ICD-10-CM

## 2015-10-09 LAB — GAMMA GT: GGT: 21 U/L (ref 7–51)

## 2015-10-11 ENCOUNTER — Other Ambulatory Visit: Payer: Self-pay | Admitting: Family Medicine

## 2015-10-11 DIAGNOSIS — N5089 Other specified disorders of the male genital organs: Secondary | ICD-10-CM

## 2015-10-11 NOTE — Progress Notes (Signed)
New orders placed

## 2015-10-16 ENCOUNTER — Telehealth: Payer: Self-pay | Admitting: *Deleted

## 2015-10-16 NOTE — Telephone Encounter (Signed)
Called the patient to advise him of his appt set for the [place] at [facility] Radiology 10/26/15 at 1130 arrive at 11/15 and nothing to eat for 6 hours prior to the test. Also to arrive at the [facility] entrance admitting to register. Not able to leave a message as his voice mail was full

## 2015-10-23 ENCOUNTER — Ambulatory Visit: Payer: Self-pay | Admitting: Internal Medicine

## 2015-10-26 ENCOUNTER — Ambulatory Visit (HOSPITAL_COMMUNITY): Payer: Medicare Other

## 2015-11-12 ENCOUNTER — Ambulatory Visit (HOSPITAL_COMMUNITY): Payer: Medicare Other

## 2015-11-20 ENCOUNTER — Ambulatory Visit (HOSPITAL_BASED_OUTPATIENT_CLINIC_OR_DEPARTMENT_OTHER)
Admission: RE | Admit: 2015-11-20 | Discharge: 2015-11-20 | Disposition: A | Discharge status: Home / Self Care | Payer: Medicare Other | Source: Ambulatory Visit | Attending: Family Medicine | Admitting: Family Medicine

## 2015-11-20 DIAGNOSIS — N503 Cyst of epididymis: Secondary | ICD-10-CM | POA: Diagnosis not present

## 2015-11-20 DIAGNOSIS — N509 Disorder of male genital organs, unspecified: Secondary | ICD-10-CM | POA: Diagnosis present

## 2015-11-20 DIAGNOSIS — N5089 Other specified disorders of the male genital organs: Secondary | ICD-10-CM

## 2015-11-21 NOTE — Progress Notes (Signed)
Called pt and lmovm to return call.

## 2015-11-22 ENCOUNTER — Encounter: Payer: Self-pay | Admitting: General Practice

## 2016-01-24 IMAGING — CT CT ABD-PELV W/ CM
2 of 5 series · 15 of 46 positions shown, 17 images · IV contrast (100 ML OMNI 300)
Comparison: CT dated 07/16/2011 and ultrasound dated 12/14/2014

CLINICAL DATA: 64-year-old male with abdominal pain, nausea and
vomiting. History of hepatitis Celsius and hepatic steatosis.

EXAM:
CT ABDOMEN AND PELVIS WITH CONTRAST
TECHNIQUE: Multidetector CT imaging of the abdomen and pelvis was performed
using the standard protocol following bolus administration of
intravenous contrast.
CONTRAST:  100mL OMNIPAQUE IOHEXOL 300 MG/ML  SOLN

[Series 2: abd/pel with · axial · 0.67mm/px · z∈[-106,+254]mm · 12 of 82 slices shown, 14 images]
[im 5/82  soft-tissue]
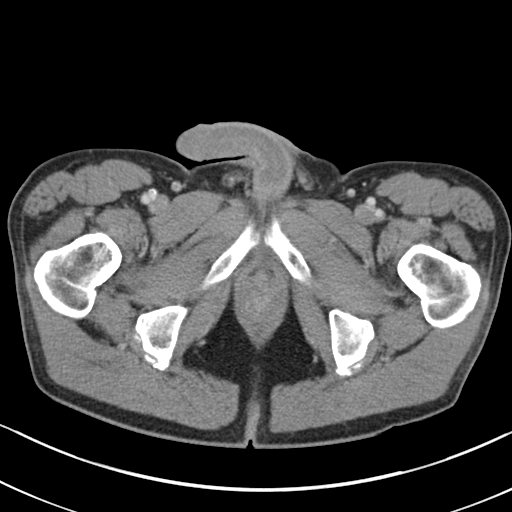
[im 5/82  bone]
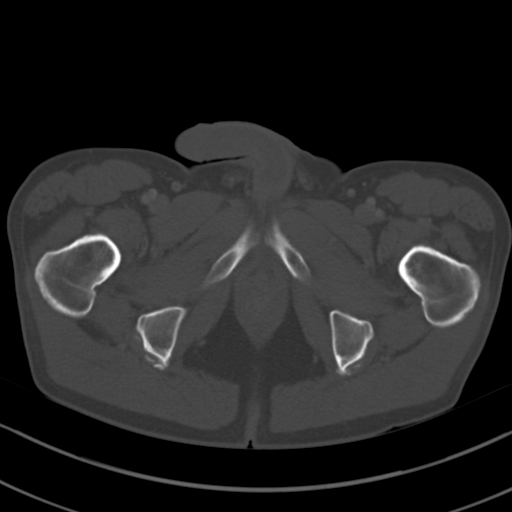
[im 13/82  soft-tissue]
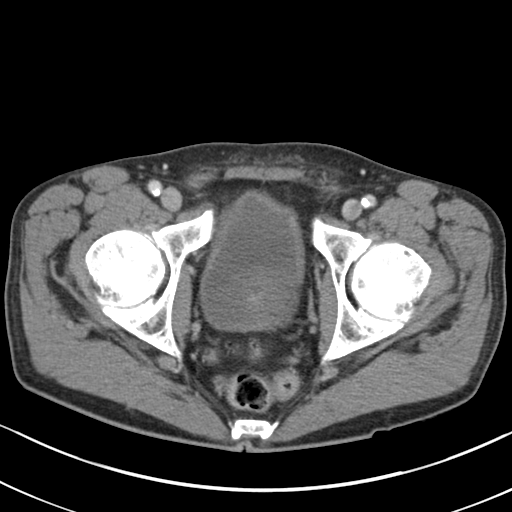
[im 17/82  soft-tissue]
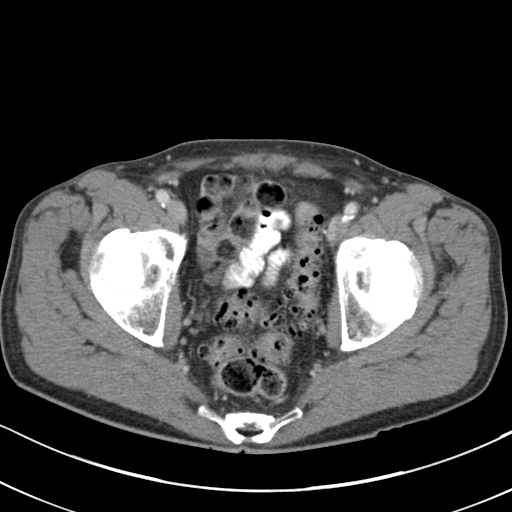
[im 25/82  soft-tissue]
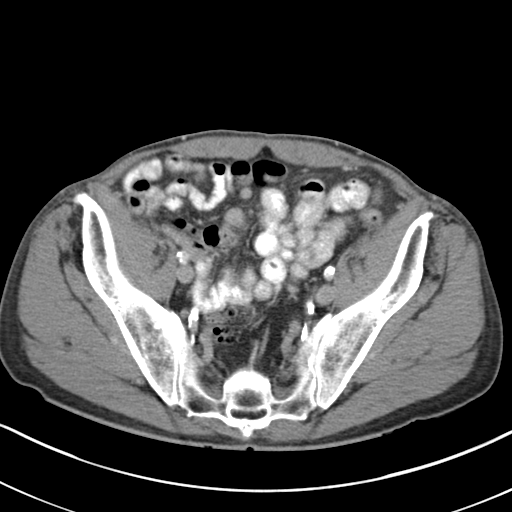
[im 33/82  soft-tissue]
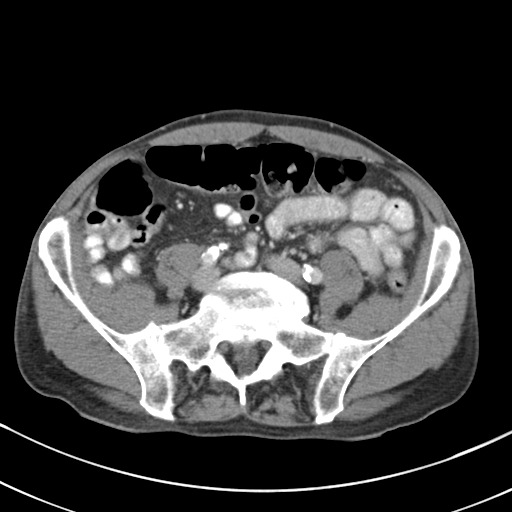
[im 37/82  soft-tissue]
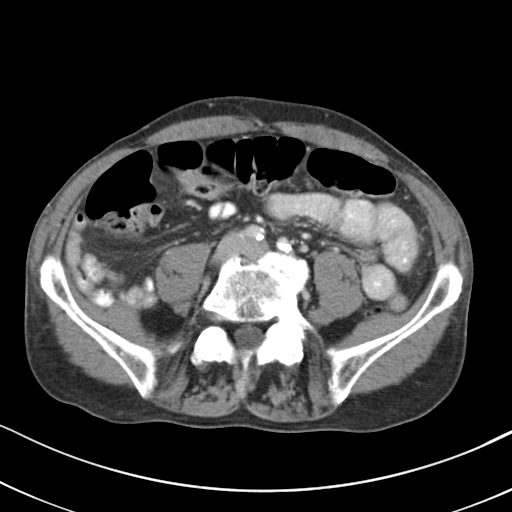
[im 45/82  soft-tissue]
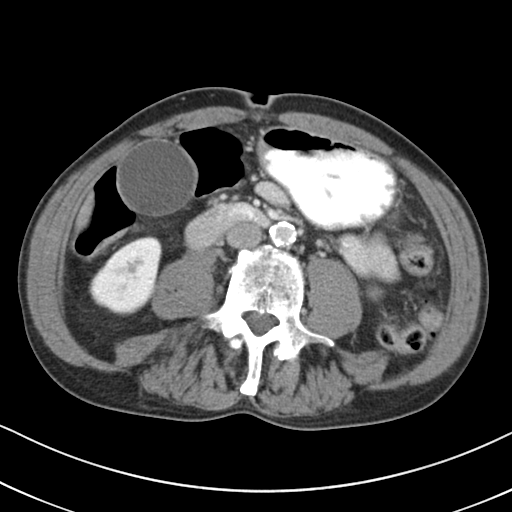
[im 49/82  soft-tissue]
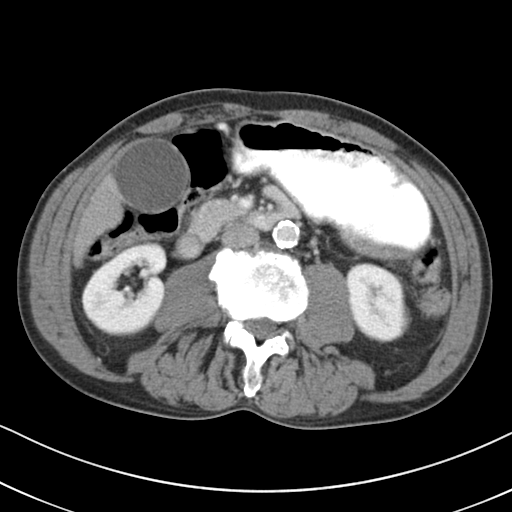
[im 57/82  soft-tissue]
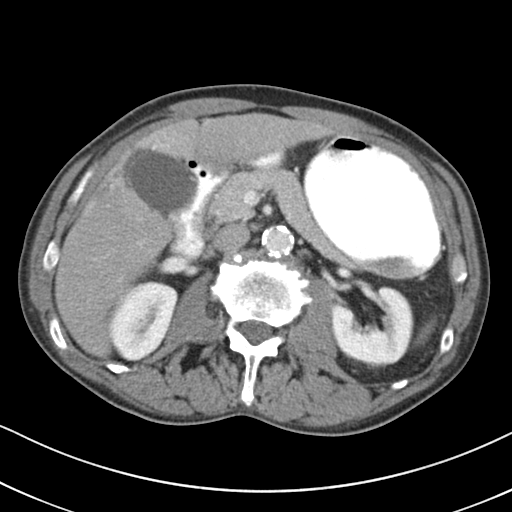
[im 57/82  bone]
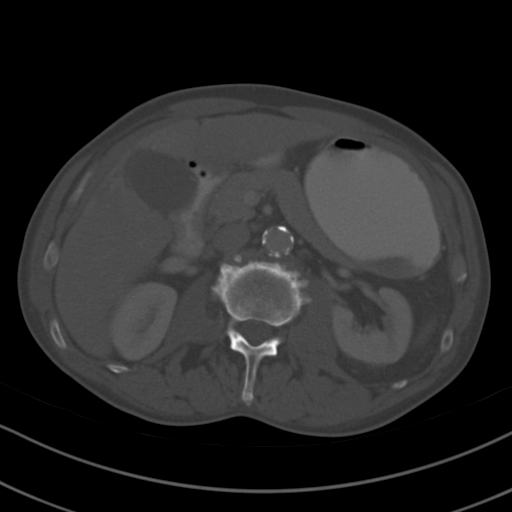
[im 65/82  soft-tissue]
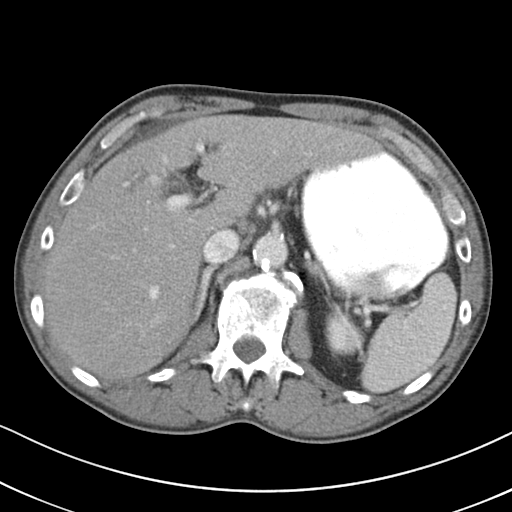
[im 69/82  soft-tissue]
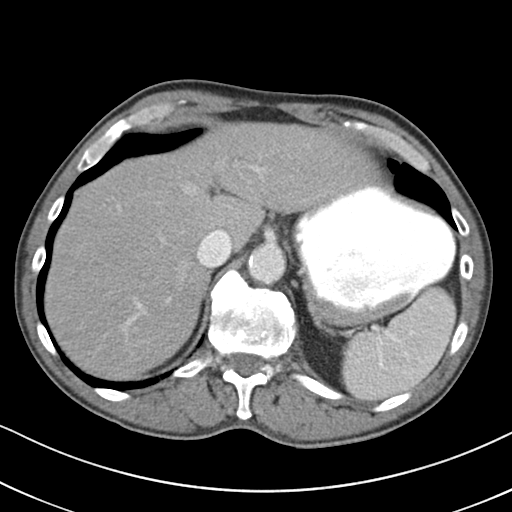
[im 77/82  soft-tissue]
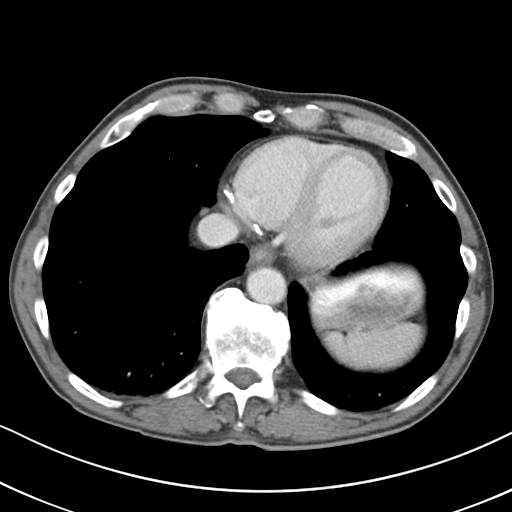

[Series 4: coronal a/|p · coronal · 0.60mm/px · 3 of 80 slices shown]
[im 27/80  soft-tissue]
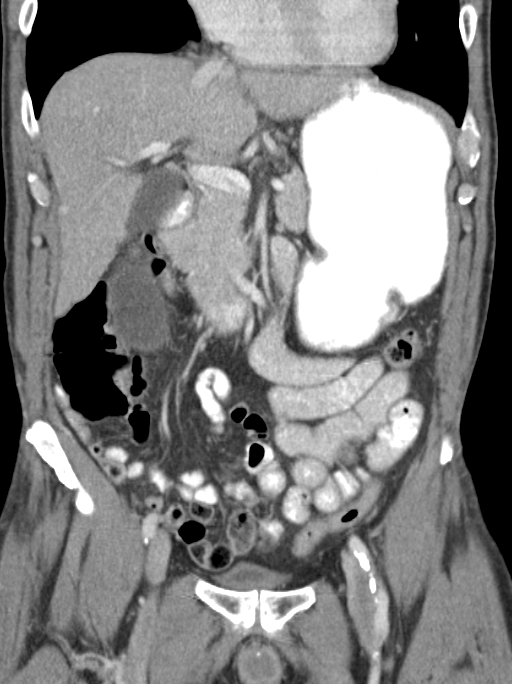
[im 36/80  soft-tissue]
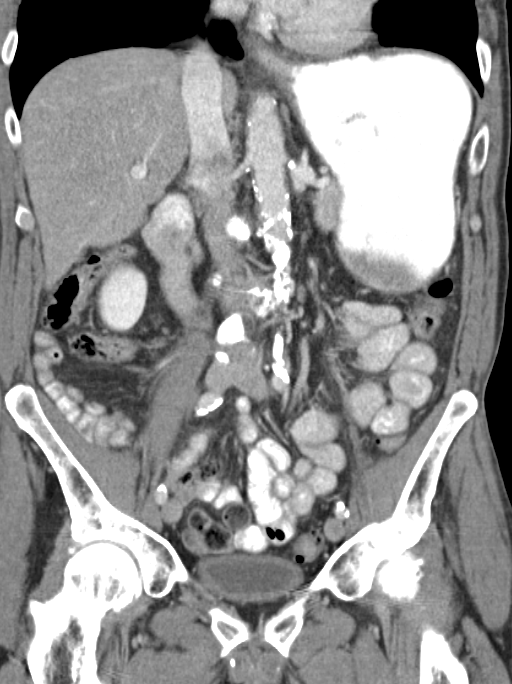
[im 44/80  soft-tissue]
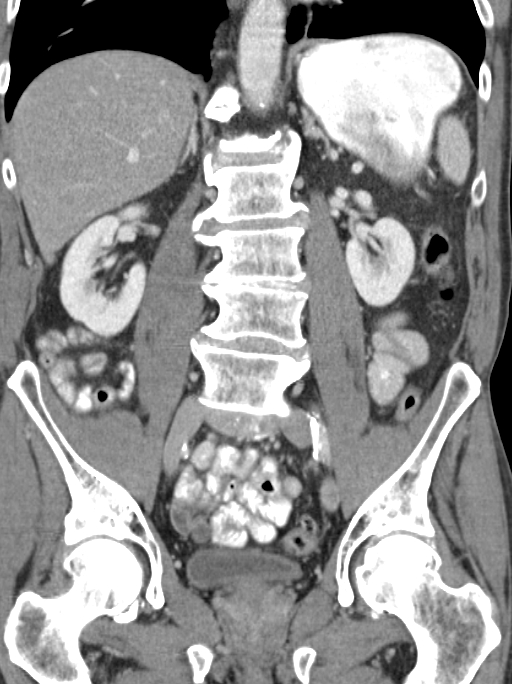

[15 of 46 positions shown; findings below may reference images not displayed]

FINDINGS: The visualized lung bases are clear. Minimal bibasilar dependent
atelectatic changes noted. There is coronary vascular calcification.
No intra-abdominal free air or free fluid.

There is hepatic contour nodularity compatible with underlying
cirrhosis. A focal ill-defined area of slight enhancement in the
arterial phase in the medial segment of the left lobe of liver
adjacent to the gallbladder fossa likely represents vascular
shunting. The gallbladder is distended. There is apparent thickening
of the proximal wall of the gallbladder with trace pericholecystic
fluid. No calcified stone identified. Ultrasound is recommended for
better evaluation of the gallbladder. The pancreas, spleen, adrenal
glands, kidneys, visualized ureters, and urinary bladder appear
unremarkable. The prostate and seminal vesicles are grossly
unremarkable.

There is extensive sigmoid diverticulosis with muscular hypertrophy.
No definite active inflammation. There is no evidence of bowel
obstruction. Normal appendix.

There is aortoiliac atherosclerotic disease. No portal venous gas
identified. There is no adenopathy. The abdominal wall soft tissues
appear unremarkable. There is degenerative changes of the spine. No
acute fracture.
IMPRESSION: Mildly distended gallbladder with small pericholecystic fluid.
Ultrasound is recommended for better evaluation of the gallbladder.

Cirrhosis. Focal ill-defined enhancement along the gallbladder fossa
in the left lobe of the liver likely represents vascular shunting.
MRI may provide better evaluation.

Sigmoid diverticulosis. No evidence of bowel obstruction or active
inflammation. Normal appendix.

## 2016-11-22 DIAGNOSIS — I700 Atherosclerosis of aorta: Secondary | ICD-10-CM | POA: Diagnosis not present

## 2016-11-22 DIAGNOSIS — R001 Bradycardia, unspecified: Secondary | ICD-10-CM | POA: Diagnosis not present

## 2016-11-22 DIAGNOSIS — R071 Chest pain on breathing: Secondary | ICD-10-CM | POA: Diagnosis not present

## 2016-11-22 DIAGNOSIS — Z5321 Procedure and treatment not carried out due to patient leaving prior to being seen by health care provider: Secondary | ICD-10-CM | POA: Diagnosis not present

## 2016-11-27 DIAGNOSIS — R079 Chest pain, unspecified: Secondary | ICD-10-CM | POA: Diagnosis not present

## 2016-11-27 DIAGNOSIS — J449 Chronic obstructive pulmonary disease, unspecified: Secondary | ICD-10-CM | POA: Diagnosis not present

## 2016-11-27 DIAGNOSIS — R7989 Other specified abnormal findings of blood chemistry: Secondary | ICD-10-CM | POA: Diagnosis not present

## 2016-11-27 DIAGNOSIS — R0600 Dyspnea, unspecified: Secondary | ICD-10-CM | POA: Diagnosis not present

## 2016-11-28 DIAGNOSIS — J449 Chronic obstructive pulmonary disease, unspecified: Secondary | ICD-10-CM | POA: Diagnosis not present

## 2016-11-28 DIAGNOSIS — B182 Chronic viral hepatitis C: Secondary | ICD-10-CM | POA: Diagnosis not present

## 2016-11-28 DIAGNOSIS — Z7982 Long term (current) use of aspirin: Secondary | ICD-10-CM | POA: Diagnosis not present

## 2016-11-28 DIAGNOSIS — I10 Essential (primary) hypertension: Secondary | ICD-10-CM | POA: Diagnosis not present

## 2016-11-28 DIAGNOSIS — F17210 Nicotine dependence, cigarettes, uncomplicated: Secondary | ICD-10-CM | POA: Diagnosis not present

## 2016-11-28 DIAGNOSIS — R079 Chest pain, unspecified: Secondary | ICD-10-CM | POA: Diagnosis not present

## 2016-11-28 DIAGNOSIS — R748 Abnormal levels of other serum enzymes: Secondary | ICD-10-CM | POA: Diagnosis not present

## 2016-11-28 DIAGNOSIS — I700 Atherosclerosis of aorta: Secondary | ICD-10-CM | POA: Diagnosis not present

## 2016-11-28 DIAGNOSIS — I252 Old myocardial infarction: Secondary | ICD-10-CM | POA: Diagnosis not present

## 2016-11-28 DIAGNOSIS — I358 Other nonrheumatic aortic valve disorders: Secondary | ICD-10-CM | POA: Diagnosis not present

## 2016-11-28 DIAGNOSIS — I25118 Atherosclerotic heart disease of native coronary artery with other forms of angina pectoris: Secondary | ICD-10-CM | POA: Diagnosis not present

## 2016-11-30 DIAGNOSIS — R001 Bradycardia, unspecified: Secondary | ICD-10-CM | POA: Diagnosis not present

## 2016-12-01 DIAGNOSIS — R9431 Abnormal electrocardiogram [ECG] [EKG]: Secondary | ICD-10-CM | POA: Diagnosis not present

## 2016-12-01 DIAGNOSIS — R001 Bradycardia, unspecified: Secondary | ICD-10-CM | POA: Diagnosis not present

## 2016-12-09 ENCOUNTER — Encounter: Payer: Self-pay | Admitting: Physician Assistant

## 2016-12-09 ENCOUNTER — Ambulatory Visit (INDEPENDENT_AMBULATORY_CARE_PROVIDER_SITE_OTHER): Payer: Medicare Other | Admitting: Physician Assistant

## 2016-12-09 VITALS — BP 132/78 | HR 55 | Temp 98.6°F | Resp 14 | Ht 71.0 in | Wt 162.0 lb

## 2016-12-09 DIAGNOSIS — I10 Essential (primary) hypertension: Secondary | ICD-10-CM | POA: Diagnosis not present

## 2016-12-09 DIAGNOSIS — K7460 Unspecified cirrhosis of liver: Secondary | ICD-10-CM | POA: Diagnosis not present

## 2016-12-09 DIAGNOSIS — F1020 Alcohol dependence, uncomplicated: Secondary | ICD-10-CM

## 2016-12-09 DIAGNOSIS — F329 Major depressive disorder, single episode, unspecified: Secondary | ICD-10-CM | POA: Diagnosis not present

## 2016-12-09 DIAGNOSIS — D696 Thrombocytopenia, unspecified: Secondary | ICD-10-CM

## 2016-12-09 DIAGNOSIS — R0789 Other chest pain: Secondary | ICD-10-CM

## 2016-12-09 DIAGNOSIS — M62838 Other muscle spasm: Secondary | ICD-10-CM

## 2016-12-09 DIAGNOSIS — F32A Depression, unspecified: Secondary | ICD-10-CM

## 2016-12-09 MED ORDER — CYCLOBENZAPRINE HCL 10 MG PO TABS: 10.0000 mg | ORAL_TABLET | Freq: Every day | ORAL | 0 refills | Status: DC

## 2016-12-09 MED ORDER — CITALOPRAM HYDROBROMIDE 40 MG PO TABS: 40.0000 mg | ORAL_TABLET | Freq: Every day | ORAL | 0 refills | Status: DC

## 2016-12-09 NOTE — Progress Notes (Signed)
Pre visit review using our clinic review tool, if applicable. No additional management support is needed unless otherwise documented below in the visit note. 

## 2016-12-09 NOTE — Patient Instructions (Addendum)
Please continue medications as directed.  Restart the Citalopram as directed -- Take 1/2 tablet daily for 2 weeks before increasing to 1 tablet daily.   Follow-up with Dr. Beverely Low in 4 weeks.  You will need repeat cholesterol and liver assessment at that time.  Please use the Flexeril as directed for trapezius spasm.  Call if not improving.

## 2016-12-09 NOTE — Progress Notes (Addendum)
Patient presents to clinic today for hospital follow-up.  Records reviewed in [place]. Patient presented to [facility] ER on 11/22/16 with chest pain. Initial ER workup revealed elevated Troponin with negative EKG. Was to be admitted for further assessment and management but patient left AMA. Patient presented to ER again 1 week later with continued chest pain. Was then admitted for assessment and management.  Workup in hospital included telemetry, CTA (negative for PE) and unremarkable labs. CT incidentally noted dilation of ascending aorta at 4.2 cm requiring repeat imaging yearly. Pain felt to be musculoskeletal and was treated as such. Patient discharged on 11/29/16 with PCP and Cardiology follow-up. Since hospitalization, patient denies any recurrence of chest pain. Has been taking BP medications as directed and tolerating well. Is taking statin given in hospital. Patient denies chest pain, palpitations, lightheadedness, dizziness, vision changes or frequent headaches. Has been out of citalopram for a few months. Is noting increased anxiety and depressed mood. Patient has noted some shoulder tension and pain bilaterally. Denies trauma or injury. Has not tried anything for symptoms.  Past Medical History:  Diagnosis Date  . CAD (coronary artery disease) 11/13   a. 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 Depression   . ETOH abuse   . Heart attack ([place])   . Hepatic steatosis   . Hepatitis C    a. s/p [name] ~ 2003 in [place], [place] => stopped after 6 mos due to neutropenia - viral load noted to be low at that time;  no follow up since;   b. abdominal u/s 11/13:  diffuse hepatic steatosis and/or hepatocellular disease    . Hx of echocardiogram    a. Echo 01/06/12: Mild focal basal  hypertrophy the septum, vigorous LV, EF 65-70%, Gr 2 diast dysfn, trivial AI, MAC, trivial MR, trivial TR, PASP 36  . Non-STEMI (non-ST elevated myocardial infarction) ([place])   . Thrombocytopenia ([place])   . Tobacco abuse     Current Outpatient Prescriptions on File Prior to Visit  Medication Sig Dispense Refill  . aspirin EC 81 MG tablet Take 81-162 mg by mouth daily.     . Multiple Vitamin (MULTIVITAMIN) tablet Take 1 tablet by mouth daily.    . nitroGLYCERIN (NITROSTAT) 0.4 MG SL tablet Place 0.4 mg under the tongue every 5 (five) minutes as needed for chest pain.    . beclomethasone (QVAR) 40 MCG/ACT inhaler Inhale 1 puff into the lungs 2 (two) times daily. 1 Inhaler 12   No current facility-administered medications on file prior to visit.     No Known Allergies  Family History  Problem Relation Age of Onset  . Heart attack Mother 24  . Diabetes Mother   . Diabetes Maternal Grandfather   . Heart disease Maternal Grandfather     Social History   Social History  . Marital status: Single    Spouse name: N/A  . Number of children: 1  . Years of education: N/A   Occupational History  . 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 Smoking status: Light Tobacco Smoker    Packs/day: 0.10    Types: Cigarettes  . Smokeless tobacco: Never Used     Comment: almost quit, very rare, 1-2 daily  .  Alcohol use 0.0 oz/week     Comment: 2-3 beers on weekends, previous 6-8 beers daily. Patient stopped drinking 5 months prior 05/27/13  . Drug use: Yes    Types: Cocaine     Comment: history of cocaine last use 2014  . Sexual activity: Yes    Partners: Female     Comment: pt. declined condoms   Other Topics Concern  . None   Social History Narrative  . None    Review of Systems - See HPI.  All other ROS are negative.  BP 132/78   Pulse (!) 55   Temp 98.6 F (37 C) (Oral)   Resp 14   Ht _0  (1.803 m)   Wt 162 lb (73.5 kg)   SpO2 98%   BMI  22.59 kg/m   Physical Exam  Constitutional: He is well-developed, well-nourished, and in no distress.  HENT:  Head: Normocephalic and atraumatic.  Eyes: Conjunctivae are normal.  Neck: Neck supple. Muscular tenderness present. No spinous process tenderness present.  Cardiovascular: Normal rate, regular rhythm, normal heart sounds and intact distal pulses.   Pulmonary/Chest: Effort normal and breath sounds normal. No respiratory distress. He has no wheezes. He has no rales. He exhibits no tenderness.  Skin: Skin is warm and dry. No rash noted.  Vitals reviewed.  Assessment/Plan: 1. Hepatic cirrhosis, unspecified hepatic cirrhosis type [facility]) Patient endorses seeing Hepatology earlier this year, completing 24-monthcourse of [place]. Liver enzymes during hospitalization are within normal limits. Follow-up with specialist as scheduled.  2. [place] Stable on blood work during hospitalization.    3. [place] Long-standing history. Patient endorses completely stopping since visit to the ER. Denies cravings. Is also working on smoking cessation -- down to 3 cigarettes per day.   4. Essential hypertension BP stable today. Discussed compliance with medications. Working on smoking cessation. Has completely stopped consumption of alcohol. Follow-up scheduled. Needs repeat CMP and Lipids at follow-up.  5. Depression, unspecified depression type Restart Citalopram at 1/2 tablet daily for 2 weeks. Then increase back to prior dose of 1 tablet daily. Follow-up scheduled with PCP.  6. Trapezius muscle spasm Start Flexeril. Supportive measures and OTC medications reviewed.  7. Atypical chest pain Negative cardiac workup. Resolved. Patient is taking BP medications and cholesterol medication as directed. Compliance previously a major issue. Discussed importance of taking medications as directed. Patient scheduled to follow-up with PCP. Cardiology follow-up scheduled.   MLeeanne Rio PA-C

## 2017-01-06 ENCOUNTER — Ambulatory Visit (INDEPENDENT_AMBULATORY_CARE_PROVIDER_SITE_OTHER): Payer: Medicare Other | Admitting: Family Medicine

## 2017-01-06 ENCOUNTER — Encounter: Payer: Self-pay | Admitting: Family Medicine

## 2017-01-06 VITALS — BP 120/81 | HR 80 | Temp 98.1°F | Resp 17 | Ht 71.0 in | Wt 159.1 lb

## 2017-01-06 DIAGNOSIS — F339 Major depressive disorder, recurrent, unspecified: Secondary | ICD-10-CM

## 2017-01-06 DIAGNOSIS — I714 Abdominal aortic aneurysm, without rupture, unspecified: Secondary | ICD-10-CM | POA: Insufficient documentation

## 2017-01-06 DIAGNOSIS — I10 Essential (primary) hypertension: Secondary | ICD-10-CM | POA: Diagnosis not present

## 2017-01-06 DIAGNOSIS — F1410 Cocaine abuse, uncomplicated: Secondary | ICD-10-CM | POA: Diagnosis not present

## 2017-01-06 DIAGNOSIS — F1020 Alcohol dependence, uncomplicated: Secondary | ICD-10-CM

## 2017-01-06 LAB — CBC WITH DIFFERENTIAL/PLATELET
Basophils Absolute: 0 10*3/uL (ref 0.0–0.1)
Basophils Relative: 0.8 % (ref 0.0–3.0)
Eosinophils Absolute: 0.2 10*3/uL (ref 0.0–0.7)
Eosinophils Relative: 2.7 % (ref 0.0–5.0)
HCT: 44.3 % (ref 39.0–52.0)
Hemoglobin: 15.3 g/dL (ref 13.0–17.0)
Lymphocytes Relative: 38.5 % (ref 12.0–46.0)
Lymphs Abs: 2.3 10*3/uL (ref 0.7–4.0)
MCHC: 34.4 g/dL (ref 30.0–36.0)
MCV: 100.5 fl — ABNORMAL HIGH (ref 78.0–100.0)
Monocytes Absolute: 0.4 10*3/uL (ref 0.1–1.0)
Monocytes Relative: 7.4 % (ref 3.0–12.0)
Neutro Abs: 3 10*3/uL (ref 1.4–7.7)
Neutrophils Relative %: 50.6 % (ref 43.0–77.0)
Platelets: 148 10*3/uL — ABNORMAL LOW (ref 150.0–400.0)
RBC: 4.41 Mil/uL (ref 4.22–5.81)
RDW: 13.6 % (ref 11.5–15.5)
WBC: 5.9 10*3/uL (ref 4.0–10.5)

## 2017-01-06 LAB — HEPATIC FUNCTION PANEL
ALT: 17 U/L (ref 0–53)
AST: 24 U/L (ref 0–37)
Albumin: 4.3 g/dL (ref 3.5–5.2)
Alkaline Phosphatase: 79 U/L (ref 39–117)
Bilirubin, Direct: 0.1 mg/dL (ref 0.0–0.3)
Total Bilirubin: 0.5 mg/dL (ref 0.2–1.2)
Total Protein: 7.5 g/dL (ref 6.0–8.3)

## 2017-01-06 LAB — LIPID PANEL
Cholesterol: 102 mg/dL (ref 0–200)
HDL: 33.5 mg/dL — ABNORMAL LOW (ref >39.00)
LDL Cholesterol: 52 mg/dL (ref 0–99)
NonHDL: 68.06
Total CHOL/HDL Ratio: 3
Triglycerides: 79 mg/dL (ref 0.0–149.0)
VLDL: 15.8 mg/dL (ref 0.0–40.0)

## 2017-01-06 LAB — BASIC METABOLIC PANEL
BUN: 12 mg/dL (ref 6–23)
CO2: 29 mEq/L (ref 19–32)
Calcium: 9.9 mg/dL (ref 8.4–10.5)
Chloride: 101 mEq/L (ref 96–112)
Creatinine, Ser: 1.01 mg/dL (ref 0.40–1.50)
GFR: 78.16 mL/min (ref >60.00)
Glucose, Bld: 90 mg/dL (ref 70–99)
Potassium: 4.8 mEq/L (ref 3.5–5.1)
Sodium: 137 mEq/L (ref 135–145)

## 2017-01-06 LAB — TSH: TSH: 0.2 u[IU]/mL — ABNORMAL LOW (ref 0.35–4.50)

## 2017-01-06 MED ORDER — ASPIRIN EC 81 MG PO TBEC: 81.0000 mg | DELAYED_RELEASE_TABLET | Freq: Every day | ORAL | 1 refills | Status: DC

## 2017-01-06 MED ORDER — ATORVASTATIN CALCIUM 40 MG PO TABS: 40.0000 mg | ORAL_TABLET | Freq: Every day | ORAL | 1 refills | Status: DC

## 2017-01-06 MED ORDER — NITROGLYCERIN 0.4 MG SL SUBL
0.4000 mg | SUBLINGUAL_TABLET | SUBLINGUAL | 1 refills | Status: DC | PRN
Start: 2017-01-06 — End: 2018-12-22

## 2017-01-06 MED ORDER — BECLOMETHASONE DIPROP HFA 40 MCG/ACT IN AERB: 1.0000 | INHALATION_SPRAY | Freq: Two times a day (BID) | RESPIRATORY_TRACT | 3 refills | Status: DC

## 2017-01-06 MED ORDER — LISINOPRIL 2.5 MG PO TABS: 2.5000 mg | ORAL_TABLET | Freq: Every day | ORAL | 1 refills | Status: DC

## 2017-01-06 MED ORDER — CITALOPRAM HYDROBROMIDE 40 MG PO TABS: 40.0000 mg | ORAL_TABLET | Freq: Every day | ORAL | 0 refills | Status: DC

## 2017-01-06 NOTE — Assessment & Plan Note (Signed)
New.  Noted on imaging done during pt's recent hospitalization.  On ASA and statin.  Will refer to vascular to follow along and monitor the situation.  Stressed importance of regular followup- at this point yearly and possibly more frequently as time goes on.  Will continue to follow.

## 2017-01-06 NOTE — Progress Notes (Signed)
   Subjective:    Patient ID: Hunter Mcmillan, male    DOB: May 21, 1950, 66 y.o.   MRN: [id number]  HPI HTN- chronic problem, on Lisinopril 2.5 mg daily w/ good control.  No CP since going to the ER in October.  No SOB, HAs, visual changes, edema.  Depression- chronic problem, pt reports mood is better since starting the Citalopram.    AAA- while hospitalized was noted to have 4.2cm abdominal aortic aneurysm.  Pt has not seen vascular.  Taking ASA daily.  Polysubstance abuse- pt has 'completely quit drinking alcohol and smoking crack cocaine and is down to 2 cigarettes daily' with the intention of quitting all together- 'by Christmas'.  Pt will celebrate 6 months alcohol free next week and 'about the same' for crack cocaine.  Review of Systems For ROS see HPI     Objective:   Physical Exam  Constitutional: He is oriented to person, place, and time. He appears well-developed and well-nourished. No distress.  HENT:  Head: Normocephalic and atraumatic.  Eyes: Conjunctivae and EOM are normal. Pupils are equal, round, and reactive to light.  Neck: Normal range of motion. Neck supple. No thyromegaly present.  Cardiovascular: Normal rate, regular rhythm, normal heart sounds and intact distal pulses.  No murmur heard. Pulmonary/Chest: Effort normal and breath sounds normal. No respiratory distress.  Abdominal: Soft. Bowel sounds are normal. He exhibits no distension.  Musculoskeletal: He exhibits no edema.  Lymphadenopathy:    He has no cervical adenopathy.  Neurological: He is alert and oriented to person, place, and time. No cranial nerve deficit.  Skin: Skin is warm and dry.  Psychiatric: He has a normal mood and affect. His behavior is normal.          Assessment & Plan:

## 2017-01-06 NOTE — Assessment & Plan Note (Signed)
Chronic problem.  Pt reports he is close to 6 months sober.  Applauded his efforts.  Will watch for relapse as unfortunately pt has walked this road before.  Will follow.

## 2017-01-06 NOTE — Patient Instructions (Signed)
Schedule your Medicare Wellness Visit in 3-4 months and a follow up with me around the same time We'll notify you of your lab results and make any changes if needed Continue to work on quitting smoking- you can do it!!! I'm proud of your and your sobriety!  Keep up the good work!!! Call with any questions or concerns Happy Fall!!!

## 2017-01-06 NOTE — Assessment & Plan Note (Signed)
Chronic problem.  Well controlled today.  Asymptomatic.  Check labs.  No anticipated med changes. 

## 2017-01-06 NOTE — Assessment & Plan Note (Signed)
Chronic problem.  Pt reports mood has improved since restarting Celexa.  Feels sxs are currently well controlled.  Will continue to follow.

## 2017-01-06 NOTE — Assessment & Plan Note (Signed)
Pt is close to celebrating 6 months of sobriety.  Applauded his efforts.  Pt has been able to quit for short periods of time in the past but has always relapsed.  Hopefully his efforts will stick this time.  Will continue to follow.

## 2017-01-07 ENCOUNTER — Telehealth: Payer: Self-pay | Admitting: Family Medicine

## 2017-01-07 ENCOUNTER — Other Ambulatory Visit: Payer: Self-pay | Admitting: Family Medicine

## 2017-01-07 DIAGNOSIS — E0590 Thyrotoxicosis, unspecified without thyrotoxic crisis or storm: Secondary | ICD-10-CM

## 2017-01-07 DIAGNOSIS — R7989 Other specified abnormal findings of blood chemistry: Secondary | ICD-10-CM

## 2017-01-07 NOTE — Progress Notes (Signed)
Called pt and lmovm to return call.

## 2017-01-07 NOTE — Telephone Encounter (Signed)
Pt advised of his results. Pt would like to know the status of his referral to vascular. I advised that this could take a few days but pt wanted me to route a note.

## 2017-01-07 NOTE — Telephone Encounter (Signed)
Patient requesting call back with lab results.

## 2017-01-13 ENCOUNTER — Telehealth: Payer: Self-pay | Admitting: General Practice

## 2017-01-13 NOTE — Telephone Encounter (Signed)
Received a fax from Patient pharmacy that advised that QVAR redihaler is not covered by insurance. Notes included advise that insurance would prefer provider send in arnuity, flovent, etc.   Please advise?

## 2017-01-15 MED ORDER — FLUTICASONE PROPIONATE HFA 110 MCG/ACT IN AERO: 2.0000 | INHALATION_SPRAY | Freq: Two times a day (BID) | RESPIRATORY_TRACT | 12 refills | Status: DC

## 2017-01-15 NOTE — Telephone Encounter (Signed)
Ok for [employer] HFA 2 puffs BID

## 2017-01-15 NOTE — Telephone Encounter (Signed)
New medication filled to pharmacy. Will call and inform pt.

## 2017-01-15 NOTE — Telephone Encounter (Signed)
Called and left a detailed message to advise pt of PCP recommendations.  

## 2017-01-15 NOTE — Addendum Note (Signed)
Addended by: Geannie Risen on: 01/15/2017 10:36 AM   Modules accepted: Orders

## 2017-01-28 ENCOUNTER — Encounter (HOSPITAL_COMMUNITY): Payer: Self-pay | Admitting: Emergency Medicine

## 2017-01-28 ENCOUNTER — Emergency Department (HOSPITAL_COMMUNITY): Payer: Medicare Other

## 2017-01-28 ENCOUNTER — Emergency Department (HOSPITAL_COMMUNITY)
Admission: EM | Admit: 2017-01-28 | Discharge: 2017-01-28 | Disposition: A | Discharge status: Home / Self Care | Payer: Medicare Other | Attending: Emergency Medicine | Admitting: Emergency Medicine

## 2017-01-28 DIAGNOSIS — X503XXA Overexertion from repetitive movements, initial encounter: Secondary | ICD-10-CM | POA: Diagnosis not present

## 2017-01-28 DIAGNOSIS — I2510 Atherosclerotic heart disease of native coronary artery without angina pectoris: Secondary | ICD-10-CM | POA: Insufficient documentation

## 2017-01-28 DIAGNOSIS — S9032XA Contusion of left foot, initial encounter: Secondary | ICD-10-CM | POA: Diagnosis not present

## 2017-01-28 DIAGNOSIS — I252 Old myocardial infarction: Secondary | ICD-10-CM | POA: Diagnosis not present

## 2017-01-28 DIAGNOSIS — Z7982 Long term (current) use of aspirin: Secondary | ICD-10-CM | POA: Insufficient documentation

## 2017-01-28 DIAGNOSIS — S99929A Unspecified injury of unspecified foot, initial encounter: Secondary | ICD-10-CM | POA: Diagnosis present

## 2017-01-28 DIAGNOSIS — Z79899 Other long term (current) drug therapy: Secondary | ICD-10-CM | POA: Insufficient documentation

## 2017-01-28 DIAGNOSIS — J449 Chronic obstructive pulmonary disease, unspecified: Secondary | ICD-10-CM | POA: Insufficient documentation

## 2017-01-28 DIAGNOSIS — Y999 Unspecified external cause status: Secondary | ICD-10-CM | POA: Diagnosis not present

## 2017-01-28 DIAGNOSIS — F17210 Nicotine dependence, cigarettes, uncomplicated: Secondary | ICD-10-CM | POA: Diagnosis not present

## 2017-01-28 DIAGNOSIS — R52 Pain, unspecified: Secondary | ICD-10-CM | POA: Diagnosis not present

## 2017-01-28 DIAGNOSIS — I10 Essential (primary) hypertension: Secondary | ICD-10-CM | POA: Diagnosis not present

## 2017-01-28 DIAGNOSIS — Y929 Unspecified place or not applicable: Secondary | ICD-10-CM | POA: Insufficient documentation

## 2017-01-28 DIAGNOSIS — S9030XA Contusion of unspecified foot, initial encounter: Secondary | ICD-10-CM

## 2017-01-28 DIAGNOSIS — M79672 Pain in left foot: Secondary | ICD-10-CM | POA: Diagnosis not present

## 2017-01-28 DIAGNOSIS — M79671 Pain in right foot: Secondary | ICD-10-CM | POA: Diagnosis not present

## 2017-01-28 DIAGNOSIS — S9031XA Contusion of right foot, initial encounter: Secondary | ICD-10-CM | POA: Insufficient documentation

## 2017-01-28 DIAGNOSIS — Y9301 Activity, walking, marching and hiking: Secondary | ICD-10-CM | POA: Diagnosis not present

## 2017-01-28 MED ORDER — IBUPROFEN 600 MG PO TABS: 600.0000 mg | ORAL_TABLET | Freq: Four times a day (QID) | ORAL | 0 refills | Status: DC | PRN

## 2017-01-28 MED ORDER — IBUPROFEN 800 MG PO TABS
800.0000 mg | ORAL_TABLET | Freq: Once | ORAL | Status: AC
  Administered 2017-01-28: 800 mg via ORAL
  Filled 2017-01-28: qty 1

## 2017-01-28 NOTE — Discharge Instructions (Signed)
Rest feet, elevate for the next 2 days.

## 2017-01-28 NOTE — ED Triage Notes (Signed)
Pt c/o bilateral foot pain after walking 5 miles today. Pt states L worse than R and states pain is throbbing on anterior portion of foot. Denies injury.

## 2017-01-30 NOTE — ED Provider Notes (Signed)
[facility] DEPT Provider Note   CSN: [id number] Arrival date & time: 01/28/17  1237     History   Chief Complaint Chief Complaint  Patient presents with  . Foot Pain    bilateral feet    HPI Thierry Dobosz is a 66 y.o. male.  The history is provided by the patient. No language interpreter was used.  Foot Pain  This is a new problem. The current episode started 3 to 5 hours ago. The problem occurs constantly. The problem has been gradually worsening. Nothing aggravates the symptoms. Nothing relieves the symptoms. He has tried nothing for the symptoms. The treatment provided no relief.   Pt walked 6 miles and complains that his feet hurt.  Past Medical History:  Diagnosis Date  . CAD (coronary artery disease) 11/13   a. 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 Depression   . ETOH abuse   . Heart attack ([place])   . Hepatic steatosis   . Hepatitis C    a. s/p [name] ~ 2003 in [place], [place] => stopped after 6 mos due to neutropenia - viral load noted to be low at that time;  no follow up since;   b. abdominal u/s 11/13:  diffuse hepatic steatosis and/or hepatocellular disease    . Hx of echocardiogram    a. Echo 01/06/12: Mild focal basal hypertrophy the septum, vigorous LV, EF 65-70%, Gr 2 diast dysfn, trivial AI, MAC, trivial MR, trivial TR, PASP 36  . Non-STEMI (non-ST elevated myocardial infarction) ([place])   . Thrombocytopenia ([place])   . Tobacco abuse     Patient Active Problem List   Diagnosis Date Noted  . Abdominal aortic aneurysm (AAA) 3.0 cm to 5.5 cm in diameter in male ([place]) 01/06/2017  . HTN (hypertension) 04/09/2015  . Hyperpigmentation of skin 10/05/2014  . COPD (chronic obstructive pulmonary disease) ([place]) 06/26/2014  .  Sacroiliac joint dysfunction of right side 06/26/2014  . Alcoholism ([place])   . Cocaine abuse ([place]) 11/18/2013  . Scrotal mass 07/21/2013  . Cirrhosis ([place]) 05/28/2013  . Routine general medical examination at a health care facility 07/19/2012  . Abnormal TSH 04/09/2012  . Coronary atherosclerosis of native coronary artery 01/14/2012  . Hx of non-ST elevation myocardial infarction (NSTEMI) 01/05/2012  . Tobacco abuse 01/05/2012  . Chronic hepatitis C without hepatic coma ([place]) 01/05/2012  . Depression, recurrent ([place]) 01/05/2012  . Thrombocytopenia ([place]) 01/05/2012    Past Surgical History:  Procedure Laterality Date  . EYE SURGERY    . LEFT HEART CATHETERIZATION WITH CORONARY ANGIOGRAM N/A 01/05/2012   Procedure: LEFT HEART CATHETERIZATION WITH CORONARY ANGIOGRAM;  Surgeon: Larey Dresser, MD;  Location: [facility] CATH LAB;  Service: Cardiovascular;  Laterality: N/A;       Home Medications    Prior to Admission medications   Medication Sig Start Date End Date Taking? Authorizing Provider  aspirin EC 81 MG tablet Take 1-2 tablets (81-162 mg total) daily by mouth. 01/06/17   Midge Minium, MD  atorvastatin (LIPITOR) 40 MG tablet Take 1 tablet (40 mg total) daily by mouth. 01/06/17   Midge Minium, MD  beclomethasone (QVAR REDIHALER) 40 MCG/ACT inhaler Inhale 1 puff 2 (two) times daily into the lungs. 01/06/17   Midge Minium, MD  beclomethasone (QVAR) 40 MCG/ACT inhaler Inhale 1 puff into the lungs 2 (two) times daily. 06/26/14   Midge Minium, MD  citalopram (CELEXA) 40 MG tablet Take 1 tablet (40 mg total) daily by mouth. 01/06/17   Midge Minium, MD  cyclobenzaprine (FLEXERIL) 10 MG tablet Take 1 tablet (10 mg total) by mouth at bedtime. 12/09/16   Brunetta Jeans, PA-C  fluticasone (FLOVENT HFA) 110 MCG/ACT inhaler Inhale 2 puffs 2 (two) times daily into the lungs. 01/15/17   Midge Minium, MD  ibuprofen (ADVIL,MOTRIN) 600 MG tablet Take 1 tablet (600 mg  total) by mouth every 6 (six) hours as needed. 01/28/17   Fransico Meadow, PA-C  lisinopril (PRINIVIL,ZESTRIL) 2.5 MG tablet Take 1 tablet (2.5 mg total) daily by mouth. 01/06/17   Midge Minium, MD  Multiple Vitamin (MULTIVITAMIN) tablet Take 1 tablet by mouth daily.    [provider]  nitroGLYCERIN (NITROSTAT) 0.4 MG SL tablet Place 1 tablet (0.4 mg total) every 5 (five) minutes as needed under the tongue for chest pain. 01/06/17   Midge Minium, MD    Family History Family History  Problem Relation Age of Onset  . Heart attack Mother 41  . Diabetes Mother   . Diabetes Maternal Grandfather   . Heart disease Maternal Grandfather     Social History Social History   Tobacco Use  . Smoking status: Light Tobacco Smoker    Packs/day: 0.10    Types: Cigarettes  . Smokeless tobacco: Never Used  . Tobacco comment: almost quit, very rare, 1-2 daily  Substance Use Topics  . Alcohol use: Yes    Alcohol/week: 0.0 oz    Comment: 2-3 beers on weekends, previous 6-8 beers daily. Patient stopped drinking 5 months prior 05/27/13  . Drug use: Yes    Types: Cocaine    Comment: history of cocaine last use 2014     Allergies   Patient has no known allergies.   Review of Systems Review of Systems  All other systems reviewed and are negative.    Physical Exam Updated Vital Signs BP (!) 158/68 (BP Location: Right Arm)   Pulse 75   Temp 98.1 F (36.7 C) (Oral)   Resp 18   SpO2 100%   Physical Exam  Constitutional: He is oriented to person, place, and time. He appears well-developed and well-nourished.  HENT:  Head: Normocephalic.  Eyes: EOM are normal.  Neck: Normal range of motion.  Pulmonary/Chest: Effort normal.  Abdominal: He exhibits no distension.  Musculoskeletal: Normal range of motion.  bilat feet no sign of infection,  nv and ns intact  Neurological: He is alert and oriented to person, place, and time.  Psychiatric: He has a normal mood and affect.   Nursing note and vitals reviewed.    ED Treatments / Results  Labs (all labs ordered are listed, but only abnormal results are displayed) Labs Reviewed - No data to display  EKG  EKG Interpretation None       Radiology Dg Foot Complete Left  Result Date: 01/28/2017 CLINICAL DATA:  Pain to foot arches both sides x 1 day, no injury. EXAM: LEFT FOOT - COMPLETE 3+ VIEW COMPARISON:  None. FINDINGS: There is mild degenerative change at the interphalangeal joint of great toe. No acute fracture or subluxation. No radiopaque foreign body or soft tissue gas. No plantar or Achilles calcaneal spurs. IMPRESSION: No evidence for acute  abnormality. Electronically Signed   By: Nolon Nations M.D.  On: 01/28/2017 13:33   Dg Foot Complete Right  Result Date: 01/28/2017 CLINICAL DATA:  Pain to foot arches both sides x 1 day, no injury. EXAM: RIGHT FOOT COMPLETE - 3+ VIEW COMPARISON:  12/13/2013 ankle FINDINGS: There is no evidence of fracture or dislocation. There is no evidence of arthropathy or other focal bone abnormality. Soft tissues are unremarkable. IMPRESSION: Negative. Electronically Signed   By: Nolon Nations M.D.   On: 01/28/2017 13:35    Procedures Procedures (including critical care time)  Medications Ordered in ED Medications  ibuprofen (ADVIL,MOTRIN) tablet 800 mg (800 mg Oral Given 01/28/17 1421)     Initial Impression / Assessment and Plan / ED Course  I have reviewed the triage vital signs and the nursing notes.  Pertinent labs & imaging results that were available during my care of the patient were reviewed by me and considered in my medical decision making (see chart for details).       Final Clinical Impressions(s) / ED Diagnoses   Final diagnoses:  Contusion of foot, unspecified laterality, initial encounter    ED Discharge Orders        Ordered    ibuprofen (ADVIL,MOTRIN) 600 MG tablet  Every 6 hours PRN     01/28/17 1348    An After Visit Summary  was printed and given to the patient.    Fransico Meadow, [place] 01/30/17 0981    Carmin Muskrat, MD 01/30/17 [phone]

## 2017-02-02 ENCOUNTER — Other Ambulatory Visit: Payer: Self-pay

## 2017-02-02 DIAGNOSIS — I714 Abdominal aortic aneurysm, without rupture, unspecified: Secondary | ICD-10-CM

## 2017-02-05 ENCOUNTER — Other Ambulatory Visit (INDEPENDENT_AMBULATORY_CARE_PROVIDER_SITE_OTHER): Payer: Medicare Other

## 2017-02-05 ENCOUNTER — Other Ambulatory Visit: Payer: Self-pay | Admitting: *Deleted

## 2017-02-05 DIAGNOSIS — E0590 Thyrotoxicosis, unspecified without thyrotoxic crisis or storm: Secondary | ICD-10-CM | POA: Diagnosis not present

## 2017-02-05 LAB — TSH: TSH: 0.37 u[IU]/mL (ref 0.35–4.50)

## 2017-02-05 MED ORDER — FLUTICASONE PROPIONATE HFA 110 MCG/ACT IN AERO: 2.0000 | INHALATION_SPRAY | Freq: Two times a day (BID) | RESPIRATORY_TRACT | 3 refills | Status: DC

## 2017-02-06 ENCOUNTER — Encounter: Payer: Self-pay | Admitting: General Practice

## 2017-02-19 ENCOUNTER — Encounter: Payer: Medicare Other | Admitting: Vascular Surgery

## 2017-02-19 ENCOUNTER — Ambulatory Visit (HOSPITAL_COMMUNITY): Admission: RE | Admit: 2017-02-19 | Payer: Medicare Other | Source: Ambulatory Visit

## 2017-03-03 DIAGNOSIS — I214 Non-ST elevation (NSTEMI) myocardial infarction: Secondary | ICD-10-CM

## 2017-03-03 DIAGNOSIS — I639 Cerebral infarction, unspecified: Secondary | ICD-10-CM

## 2017-03-03 HISTORY — DX: Non-ST elevation (NSTEMI) myocardial infarction: I21.4

## 2017-03-03 HISTORY — DX: Cerebral infarction, unspecified: I63.9

## 2017-05-19 ENCOUNTER — Telehealth: Payer: Self-pay | Admitting: Family Medicine

## 2017-05-19 NOTE — Telephone Encounter (Signed)
Routing to providers to see if it is okay for him to switch from [place] to [facility]

## 2017-05-19 NOTE — Telephone Encounter (Signed)
That is fine- just please advise him that it may take a few months for him to se me as a new pt

## 2017-05-19 NOTE — Telephone Encounter (Signed)
Copied from CRM [id number]. Topic: Quick Communication - See Telephone Encounter >> May 19, 2017 11:39 AM Herby Abraham C wrote: CRM for notification. See Telephone encounter for: pt would like to switch from [place] to [facility]. Pt says that he now lives closer to the [facility] office.   CB: [phone]     05/19/17.

## 2017-05-20 NOTE — Telephone Encounter (Signed)
LVM on cell phone for pt to schedule a transfer pt appt (30 min ) with Dr Patsy Lager.

## 2017-05-20 NOTE — Telephone Encounter (Signed)
Ok to switch. Thank you.

## 2017-05-21 ENCOUNTER — Ambulatory Visit: Payer: Self-pay | Admitting: Family Medicine

## 2017-05-21 ENCOUNTER — Ambulatory Visit: Payer: Self-pay

## 2017-05-24 NOTE — Progress Notes (Addendum)
[facility] at [facility] [address], [place], [place] [address] [phone]  Date:  05/25/2017   Name:  Hunter Mcmillan   DOB:  04/21/1950   MRN:  [id number]  PCP:  [name], MD    Chief Complaint: Transfer care (tranferring from Dr. Birdie Riddle, wants blood work done, alcoholic-hep c, does not currently drink since 01/2017)   History of Present Illness:  Hunter Mcmillan is a 67 y.o. very pleasant male patient who presents with the following:  Former pt of Dr. Birdie Riddle who has moved to the [place] location Here today to establish care with me and discuss his routine health concerns  From his last visit with Dr. Birdie Riddle in November: HTN- chronic problem, on Lisinopril 2.5 mg daily w/ good control.  No CP since going to the ER in October.  No SOB, HAs, visual changes, edema. Depression- chronic problem, pt reports mood is better since starting the Citalopram.   AAA- while hospitalized was noted to have 4.2cm abdominal aortic aneurysm.  Pt has not seen vascular.  Taking ASA daily. Polysubstance abuse- pt has 'completely quit drinking alcohol and smoking crack cocaine and is down to 2 cigarettes daily' with the intention of quitting all together- 'by Christmas'.  Pt will celebrate 6 months alcohol free next week and 'about the same' for crack cocaine.  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  He tries to exercise daily He is a recovering alcoholic, clean from drugs and alcohol since this past December He still does smoke a couple of cigarettes a day  He is trying to become more healthy He does have hep C, but treated with interferon back in 2000- they had to stop due to his wbc count going low In 2017 he was treated with Harvoni and was cured per his knowledge He would ike to check his hep C today just to make sure all is well and that he is cured  He is still  taking citalopram and his mood has been good- he notes that his mood is much better now that he is not drinking   His breathing is generally ok If he goes up stairs he may get a bit SOB from his COPD, but no change here Rarely has cough or wheeze  BP Readings from Last 3 Encounters:  05/25/17 (!) 141/83  01/28/17 (!) 158/68  01/06/17 120/81  his pneumonia, tdap and flu shots are UTD  He did have a colonoscopy about 5 years ago, looked ok per his report   Patient Active Problem List   Diagnosis Date Noted  . Abdominal aortic aneurysm (AAA) 3.0 cm to 5.5 cm in diameter in male ([place]) 01/06/2017  . HTN (hypertension) 04/09/2015  . Hyperpigmentation of skin 10/05/2014  . COPD (chronic obstructive pulmonary disease) ([place]) 06/26/2014  . Sacroiliac joint dysfunction of right side 06/26/2014  . Alcoholism ([place])   . Cocaine abuse ([place]) 11/18/2013  . Scrotal mass 07/21/2013  . Cirrhosis ([place]) 05/28/2013  . Routine general medical examination at a health care facility 07/19/2012  . Abnormal TSH 04/09/2012  . Coronary atherosclerosis of native coronary artery 01/14/2012  . Hx of non-ST elevation myocardial infarction (NSTEMI) 01/05/2012  . Tobacco abuse 01/05/2012  . Chronic hepatitis C without hepatic coma ([place]) 01/05/2012  . Depression, recurrent ([place]) 01/05/2012  . Thrombocytopenia ([place]) 01/05/2012    Past  Medical History:  Diagnosis Date  . Alcoholism ([place])   . CAD (coronary artery disease) 11/13   a. 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 Chicken pox   . Depression   . ETOH abuse   . Genital warts   . Heart attack ([place])   . Hepatic steatosis   . Hepatitis C    a. s/p [name] ~ 2003 in [place], [place] => stopped after 6 mos due to neutropenia - viral load noted to be low at  that time;  no follow up since;   b. abdominal u/s 11/13:  diffuse hepatic steatosis and/or hepatocellular disease    . Hx of echocardiogram    a. Echo 01/06/12: Mild focal basal hypertrophy the septum, vigorous LV, EF 65-70%, Gr 2 diast dysfn, trivial AI, MAC, trivial MR, trivial TR, PASP 36  . Hyperlipidemia   . Hypertension   . Non-STEMI (non-ST elevated myocardial infarction) ([place])   . Stroke [facility])    "minor" pt reported  . Thrombocytopenia ([place])   . Tobacco abuse     Past Surgical History:  Procedure Laterality Date  . EYE SURGERY  1954   corrective surgery for crossed eyes  . EYE SURGERY  1959  . LEFT HEART CATHETERIZATION WITH CORONARY ANGIOGRAM N/A 01/05/2012   Procedure: LEFT HEART CATHETERIZATION WITH CORONARY ANGIOGRAM;  Surgeon: Larey Dresser, MD;  Location: [facility] CATH LAB;  Service: Cardiovascular;  Laterality: N/A;    Social History   Tobacco Use  . Smoking status: Light Tobacco Smoker    Packs/day: 0.10    Types: Cigarettes  . Smokeless tobacco: Never Used  . Tobacco comment: almost quit, very rare, 1-2 daily  Substance Use Topics  . Alcohol use: Not Currently    Alcohol/week: 0.0 oz    Comment: stopped as of 01/2017  . Drug use: Yes    Types: Cocaine    Comment: history of cocaine last use 2014    Family History  Problem Relation Age of Onset  . Heart attack Mother 63  . Diabetes Mother   . Heart disease Mother   . Hyperlipidemia Father   . Hypertension Father   . Diabetes Maternal Grandfather   . Heart disease Maternal Grandfather   . Early death Maternal Grandfather   . Heart attack Maternal Grandfather   . Hearing loss Sister   . Hyperlipidemia Sister   . Depression Brother   . Diabetes Brother   . Hyperlipidemia Brother   . Early death Maternal Grandmother   . Heart disease Maternal Grandmother   . Heart attack Maternal Grandmother   . Hearing loss Sister     No Known Allergies  Medication list has been reviewed and updated.  Current  Outpatient Medications on File Prior to Visit  Medication Sig Dispense Refill  . albuterol (PROVENTIL HFA;VENTOLIN HFA) 108 (90 Base) MCG/ACT inhaler Inhale into the lungs every 6 (six) hours as needed for wheezing or shortness of breath.    [place] Kitchen aspirin EC 81 MG tablet Take 1-2 tablets (81-162 mg total) daily by mouth. 90 tablet 1  . atorvastatin (LIPITOR) 40 MG tablet Take 1 tablet (40 mg total) daily by mouth. 90 tablet 1  . citalopram (CELEXA) 40 MG tablet Take 1 tablet (40 mg total) daily by mouth. 90 tablet  0  . lisinopril (PRINIVIL,ZESTRIL) 2.5 MG tablet Take 1 tablet (2.5 mg total) daily by mouth. 90 tablet 1  . Multiple Vitamin (MULTIVITAMIN) tablet Take 1 tablet by mouth daily.    . nitroGLYCERIN (NITROSTAT) 0.4 MG SL tablet Place 1 tablet (0.4 mg total) every 5 (five) minutes as needed under the tongue for chest pain. 10 tablet 1  . beclomethasone (QVAR REDIHALER) 40 MCG/ACT inhaler Inhale 1 puff 2 (two) times daily into the lungs. (Patient not taking: Reported on 05/25/2017) 1 Inhaler 3  . beclomethasone (QVAR) 40 MCG/ACT inhaler Inhale 1 puff into the lungs 2 (two) times daily. (Patient not taking: Reported on 05/25/2017) 1 Inhaler 12  . fluticasone (FLOVENT HFA) 110 MCG/ACT inhaler Inhale 2 puffs into the lungs 2 (two) times daily. (Patient not taking: Reported on 05/25/2017) 3 Inhaler 3  . ibuprofen (ADVIL,MOTRIN) 600 MG tablet Take 1 tablet (600 mg total) by mouth every 6 (six) hours as needed. 30 tablet 0   No current facility-administered medications on file prior to visit.     Review of Systems:  As per HPI- otherwise negative. No belly pain No janudice No CP  Physical Examination: Vitals:   05/25/17 1204  BP: (!) 141/83  Pulse: 71  Resp: 18  SpO2: 97%   Vitals:   05/25/17 1204  Weight: 156 lb 6.4 oz (70.9 kg)  Height: _0  (1.803 m)   Body mass index is 21.81 kg/m. Ideal Body Weight: Weight in (lb) to have BMI = 25: 178.9  GEN: WDWN, NAD, Non-toxic, A & O x 3,  thin build, looks well  HEENT: Atraumatic, Normocephalic. Neck supple. No masses, No LAD.  Bilateral TM wnl, oropharynx normal.  PEERL,EOMI.   Ears and Nose: No external deformity. CV: RRR, No M/G/R. No JVD. No thrill. No extra heart sounds. PULM: CTA B, no wheezes, crackles, rhonchi. No retractions. No resp. distress. No accessory muscle use. ABD: S, NT, ND. No rebound. No HSM. EXTR: No c/c/e NEURO Normal gait.  PSYCH: Normally interactive. Conversant. Not depressed or anxious appearing.  Calm demeanor.    Assessment and Plan: Essential hypertension - Plan: CBC, Comprehensive metabolic panel  Thrombocytopenia (HCC)  Prostate cancer screening - Plan: PSA, Medicare  Chronic obstructive pulmonary disease, unspecified COPD type ([place])  History of hepatitis C - Plan: Hepatitis B surface antibody, Hepatitis C RNA quantitative, Comprehensive metabolic panel  Establishing care with me today, although he is not new to [employer] as above Will plan further follow- up pending labs. Praised his sobriety   Signed Lamar Blinks, MD  Received his labs 3/26- still waiting on hep C RNA level, then will send letter.  3/28- all labs in, letter to pt   Lab Results  Component Value Date   PSA 0.31 05/25/2017   PSA 0.78 10/08/2015   PSA 1.18 10/05/2014    Results for orders placed or performed in visit on 05/25/17  CBC  Result Value Ref Range   WBC 5.7 4.0 - 10.5 K/uL   RBC 4.76 4.22 - 5.81 Mil/uL   Platelets 151.0 150.0 - 400.0 K/uL   Hemoglobin 16.3 13.0 - 17.0 g/dL   HCT 47.5 39.0 - 52.0 %   MCV 99.8 78.0 - 100.0 fl   MCHC 34.4 30.0 - 36.0 g/dL   RDW 13.3 11.5 - 15.5 %  Hepatitis B surface antibody  Result Value Ref Range   Hepatitis B-Post 68 > OR = 10 mIU/mL  Hepatitis C RNA quantitative  Result Value Ref  Range   HCV RNA, PCR, QN <15 NOT DETECTED NOT DETECT IU/mL   HCV Quantitative Log <1.18 NOT DETECTED NOT DETECT Log IU/mL  Comprehensive metabolic panel  Result Value  Ref Range   Sodium 139 135 - 145 mEq/L   Potassium 4.9 3.5 - 5.1 mEq/L   Chloride 101 96 - 112 mEq/L   CO2 31 19 - 32 mEq/L   Glucose, Bld 104 (H) 70 - 99 mg/dL   BUN 21 6 - 23 mg/dL   Creatinine, Ser 1.23 0.40 - 1.50 mg/dL   Total Bilirubin 0.6 0.2 - 1.2 mg/dL   Alkaline Phosphatase 68 39 - 117 U/L   AST 17 0 - 37 U/L   ALT 13 0 - 53 U/L   Total Protein 7.9 6.0 - 8.3 g/dL   Albumin 4.1 3.5 - 5.2 g/dL   Calcium 10.1 8.4 - 10.5 mg/dL   GFR 62.19 >60.00 mL/min  PSA, Medicare  Result Value Ref Range   PSA 0.31 0.10 - 4.00 ng/ml

## 2017-05-25 ENCOUNTER — Encounter: Payer: Self-pay | Admitting: Family Medicine

## 2017-05-25 ENCOUNTER — Ambulatory Visit (INDEPENDENT_AMBULATORY_CARE_PROVIDER_SITE_OTHER): Payer: Medicare Other | Admitting: Family Medicine

## 2017-05-25 VITALS — BP 141/83 | HR 71 | Resp 18 | Ht 71.0 in | Wt 156.4 lb

## 2017-05-25 DIAGNOSIS — I10 Essential (primary) hypertension: Secondary | ICD-10-CM

## 2017-05-25 DIAGNOSIS — Z8619 Personal history of other infectious and parasitic diseases: Secondary | ICD-10-CM | POA: Diagnosis not present

## 2017-05-25 DIAGNOSIS — Z125 Encounter for screening for malignant neoplasm of prostate: Secondary | ICD-10-CM

## 2017-05-25 DIAGNOSIS — D696 Thrombocytopenia, unspecified: Secondary | ICD-10-CM

## 2017-05-25 DIAGNOSIS — J449 Chronic obstructive pulmonary disease, unspecified: Secondary | ICD-10-CM

## 2017-05-25 LAB — COMPREHENSIVE METABOLIC PANEL
ALT: 13 U/L (ref 0–53)
AST: 17 U/L (ref 0–37)
Albumin: 4.1 g/dL (ref 3.5–5.2)
Alkaline Phosphatase: 68 U/L (ref 39–117)
BUN: 21 mg/dL (ref 6–23)
CO2: 31 mEq/L (ref 19–32)
Calcium: 10.1 mg/dL (ref 8.4–10.5)
Chloride: 101 mEq/L (ref 96–112)
Creatinine, Ser: 1.23 mg/dL (ref 0.40–1.50)
GFR: 62.19 mL/min (ref >60.00)
Glucose, Bld: 104 mg/dL — ABNORMAL HIGH (ref 70–99)
Potassium: 4.9 mEq/L (ref 3.5–5.1)
Sodium: 139 mEq/L (ref 135–145)
Total Bilirubin: 0.6 mg/dL (ref 0.2–1.2)
Total Protein: 7.9 g/dL (ref 6.0–8.3)

## 2017-05-25 LAB — CBC
HCT: 47.5 % (ref 39.0–52.0)
Hemoglobin: 16.3 g/dL (ref 13.0–17.0)
MCHC: 34.4 g/dL (ref 30.0–36.0)
MCV: 99.8 fl (ref 78.0–100.0)
Platelets: 151 10*3/uL (ref 150.0–400.0)
RBC: 4.76 Mil/uL (ref 4.22–5.81)
RDW: 13.3 % (ref 11.5–15.5)
WBC: 5.7 10*3/uL (ref 4.0–10.5)

## 2017-05-25 LAB — PSA, MEDICARE: PSA: 0.31 ng/ml (ref 0.10–4.00)

## 2017-05-25 NOTE — Patient Instructions (Signed)
It was very nice to meet you today- take care and I will be in touch with your labs Continue to work on quitting smoking entirely, and congrats on your sobriety!    Let's plan to visit in about 6 months

## 2017-05-28 LAB — HEPATITIS C RNA QUANTITATIVE
HCV Quantitative Log: <1.18 Log IU/mL
HCV RNA, PCR, QN: <15 IU/mL

## 2017-05-28 LAB — HEPATITIS B SURFACE ANTIBODY, QUANTITATIVE: Hepatitis B-Post: 68 m[IU]/mL (ref >=10)

## 2017-07-16 ENCOUNTER — Encounter (HOSPITAL_COMMUNITY): Payer: Self-pay | Admitting: Emergency Medicine

## 2017-07-16 ENCOUNTER — Emergency Department (HOSPITAL_COMMUNITY)
Admission: EM | Admit: 2017-07-16 | Discharge: 2017-07-17 | Disposition: A | Discharge status: Home / Self Care | Payer: Medicare Other | Attending: Emergency Medicine | Admitting: Emergency Medicine

## 2017-07-16 DIAGNOSIS — Z7982 Long term (current) use of aspirin: Secondary | ICD-10-CM | POA: Diagnosis not present

## 2017-07-16 DIAGNOSIS — I2510 Atherosclerotic heart disease of native coronary artery without angina pectoris: Secondary | ICD-10-CM | POA: Insufficient documentation

## 2017-07-16 DIAGNOSIS — J449 Chronic obstructive pulmonary disease, unspecified: Secondary | ICD-10-CM | POA: Insufficient documentation

## 2017-07-16 DIAGNOSIS — F1910 Other psychoactive substance abuse, uncomplicated: Secondary | ICD-10-CM | POA: Diagnosis not present

## 2017-07-16 DIAGNOSIS — F329 Major depressive disorder, single episode, unspecified: Secondary | ICD-10-CM | POA: Diagnosis present

## 2017-07-16 DIAGNOSIS — F332 Major depressive disorder, recurrent severe without psychotic features: Secondary | ICD-10-CM | POA: Diagnosis not present

## 2017-07-16 DIAGNOSIS — F1420 Cocaine dependence, uncomplicated: Secondary | ICD-10-CM | POA: Diagnosis not present

## 2017-07-16 DIAGNOSIS — F1020 Alcohol dependence, uncomplicated: Secondary | ICD-10-CM | POA: Diagnosis not present

## 2017-07-16 DIAGNOSIS — I252 Old myocardial infarction: Secondary | ICD-10-CM | POA: Diagnosis not present

## 2017-07-16 DIAGNOSIS — R45851 Suicidal ideations: Secondary | ICD-10-CM

## 2017-07-16 DIAGNOSIS — Z79899 Other long term (current) drug therapy: Secondary | ICD-10-CM | POA: Diagnosis not present

## 2017-07-16 DIAGNOSIS — I10 Essential (primary) hypertension: Secondary | ICD-10-CM | POA: Diagnosis not present

## 2017-07-16 DIAGNOSIS — F17210 Nicotine dependence, cigarettes, uncomplicated: Secondary | ICD-10-CM | POA: Insufficient documentation

## 2017-07-16 LAB — COMPREHENSIVE METABOLIC PANEL
ALT: 18 U/L (ref 17–63)
AST: 42 U/L — ABNORMAL HIGH (ref 15–41)
Albumin: 4.4 g/dL (ref 3.5–5.0)
Alkaline Phosphatase: 100 U/L (ref 38–126)
Anion gap: 10 (ref 5–15)
BUN: 8 mg/dL (ref 6–20)
CO2: 29 mmol/L (ref 22–32)
Calcium: 9.7 mg/dL (ref 8.9–10.3)
Chloride: 101 mmol/L (ref 101–111)
Creatinine, Ser: 1.01 mg/dL (ref 0.61–1.24)
GFR calc Af Amer: >60 mL/min (ref >60)
GFR calc non Af Amer: >60 mL/min (ref >60)
Glucose, Bld: 87 mg/dL (ref 65–99)
Potassium: 4.1 mmol/L (ref 3.5–5.1)
Sodium: 140 mmol/L (ref 135–145)
Total Bilirubin: 1 mg/dL (ref 0.3–1.2)
Total Protein: 8.5 g/dL — ABNORMAL HIGH (ref 6.5–8.1)

## 2017-07-16 LAB — RAPID URINE DRUG SCREEN, HOSP PERFORMED
Amphetamines: NOT DETECTED
Barbiturates: NOT DETECTED
Benzodiazepines: NOT DETECTED
Cocaine: POSITIVE — AB
Opiates: NOT DETECTED
Tetrahydrocannabinol: NOT DETECTED

## 2017-07-16 LAB — SALICYLATE LEVEL: Salicylate Lvl: <7 mg/dL (ref 2.8–30.0)

## 2017-07-16 LAB — ETHANOL: Alcohol, Ethyl (B): 15 mg/dL — ABNORMAL HIGH (ref <10)

## 2017-07-16 LAB — CBC
HCT: 47.8 % (ref 39.0–52.0)
Hemoglobin: 16.2 g/dL (ref 13.0–17.0)
MCH: 33.8 pg (ref 26.0–34.0)
MCHC: 33.9 g/dL (ref 30.0–36.0)
MCV: 99.6 fL (ref 78.0–100.0)
Platelets: 143 10*3/uL — ABNORMAL LOW (ref 150–400)
RBC: 4.8 MIL/uL (ref 4.22–5.81)
RDW: 14.7 % (ref 11.5–15.5)
WBC: 7.5 10*3/uL (ref 4.0–10.5)

## 2017-07-16 LAB — ACETAMINOPHEN LEVEL: Acetaminophen (Tylenol), Serum: <10 ug/mL — ABNORMAL LOW (ref 10–30)

## 2017-07-16 LAB — LIPASE, BLOOD: Lipase: 29 U/L (ref 11–51)

## 2017-07-16 MED ORDER — LORAZEPAM 1 MG PO TABS: 0.0000 mg | ORAL_TABLET | Freq: Four times a day (QID) | ORAL | Status: DC

## 2017-07-16 MED ORDER — THIAMINE HCL 100 MG/ML IJ SOLN: 100.0000 mg | Freq: Every day | INTRAMUSCULAR | Status: DC

## 2017-07-16 MED ORDER — VITAMIN B-1 100 MG PO TABS
100.0000 mg | ORAL_TABLET | Freq: Every day | ORAL | Status: DC
  Administered 2017-07-16 – 2017-07-17 (×2): 100 mg via ORAL
  Filled 2017-07-16 (×2): qty 1

## 2017-07-16 MED ORDER — LORAZEPAM 2 MG/ML IJ SOLN: 0.0000 mg | Freq: Four times a day (QID) | INTRAMUSCULAR | Status: DC

## 2017-07-16 MED ORDER — ACETAMINOPHEN 325 MG PO TABS
650.0000 mg | ORAL_TABLET | ORAL | Status: DC | PRN
  Administered 2017-07-17: 650 mg via ORAL
  Filled 2017-07-16: qty 2

## 2017-07-16 MED ORDER — LORAZEPAM 1 MG PO TABS: 0.0000 mg | ORAL_TABLET | Freq: Two times a day (BID) | ORAL | Status: DC

## 2017-07-16 MED ORDER — ONDANSETRON HCL 4 MG PO TABS: 4.0000 mg | ORAL_TABLET | Freq: Three times a day (TID) | ORAL | Status: DC | PRN

## 2017-07-16 MED ORDER — LORAZEPAM 2 MG/ML IJ SOLN: 0.0000 mg | Freq: Two times a day (BID) | INTRAMUSCULAR | Status: DC

## 2017-07-16 MED ORDER — ALUM & MAG HYDROXIDE-SIMETH 200-200-20 MG/5ML PO SUSP: 30.0000 mL | Freq: Four times a day (QID) | ORAL | Status: DC | PRN

## 2017-07-16 NOTE — BH Assessment (Addendum)
Tele Assessment Note   Patient Name: Hunter Mcmillan MRN: [id number] Referring Physician: MD Rex Kras  Location of Patient: [place]  Location of Provider: [place] Department  [name] is an 67 y.o. male.  The pt came in due to depression, alcohol, crack cocaine and suicidal thoughts.  The pt denies having a plan of killling himself.  The last time he had a plan was 6 months ago, when he was on the top of a parking deck and then came down.  He described his biggest stressor as his alcohol use.  According to the pt, when he drinks alcohol he becomes depressed.  He reports he drinks about 4 12 oz beers a day.  He recently did a crack cocaine binge where he was using about 6-7 grams a day.  He is currently homeless and lives in a tent.  He was previously living in a half way house, but was evicted from the half way house about 6 weeks ago.  He denied having family support in the area.  He has a sister who lives in [place] and children who live in [place].  The pt has not been inpatient for psychiatric reasons.  He has been to [facility] in the past for detox.  He was last there in 2017.  The longest period of sobriety for the pt has been 6 months.  The pt denied seizures or hallucinations while detoxing.  He is currently court ordered to go to [facility] counseling due to alcohol use.  He has received DUIs in the past.  He currently has a court date 5/30 for trespassing.  The pt stated he is sleeping well, but drinks until he goes to sleep.  He also reports he has a good appetite.  He stated he has crying spells, is more irritable, feels hopeless, has little interest in doing things and feels bad about himself.  The pt denies HI and psychosis.  Diagnosis:F33.2 Major depressive disorder, Recurrent episode, Severe   F14.20 Cocaine use disorder,  F10.20 Alcohol use disorder, Severe   Past Medical History:  Past Medical History:  Diagnosis Date  . Alcoholism ([place])   . CAD  (coronary artery disease) 11/13   a. 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 Chicken pox   . Depression   . ETOH abuse   . Genital warts   . Heart attack ([place])   . Hepatic steatosis   . Hepatitis C    a. s/p [name] ~ 2003 in [place], [place] => stopped after 6 mos due to neutropenia - viral load noted to be low at that time;  no follow up since;   b. abdominal u/s 11/13:  diffuse hepatic steatosis and/or hepatocellular disease    . Hx of echocardiogram    a. Echo 01/06/12: Mild focal basal hypertrophy the septum, vigorous LV, EF 65-70%, Gr 2 diast dysfn, trivial AI, MAC, trivial MR, trivial TR, PASP 36  . Hyperlipidemia   . Hypertension   . Non-STEMI (non-ST elevated myocardial infarction) ([employer])   . Stroke [facility])    "minor" pt reported  . Thrombocytopenia ([place])   . Tobacco abuse     Past Surgical History:  Procedure Laterality Date  . EYE SURGERY  1954   corrective surgery for crossed eyes  .  EYE SURGERY  1959  . LEFT HEART CATHETERIZATION WITH CORONARY ANGIOGRAM N/A 01/05/2012   Procedure: LEFT HEART CATHETERIZATION WITH CORONARY ANGIOGRAM;  Surgeon: Larey Dresser, MD;  Location: [facility] CATH LAB;  Service: Cardiovascular;  Laterality: N/A;    Family History:  Family History  Problem Relation Age of Onset  . Heart attack Mother 71  . Diabetes Mother   . Heart disease Mother   . Hyperlipidemia Father   . Hypertension Father   . Diabetes Maternal Grandfather   . Heart disease Maternal Grandfather   . Early death Maternal Grandfather   . Heart attack Maternal Grandfather   . Hearing loss Sister   . Hyperlipidemia Sister   . Depression Brother   . Diabetes Brother   . Hyperlipidemia Brother   . Early death Maternal Grandmother   . Heart disease Maternal Grandmother   .  Heart attack Maternal Grandmother   . Hearing loss Sister     Social History:  reports that he has been smoking cigarettes.  He has been smoking about 0.10 packs per day. He has never used smokeless tobacco. He reports that he drank alcohol. He reports that he has current or past drug history. Drug: Cocaine.  Additional Social History:  Alcohol / Drug Use Pain Medications: See MAR Prescriptions: See MAR Over the Counter: See MAR History of alcohol / drug use?: Yes Longest period of sobriety (when/how long): 6 months Negative Consequences of Use: Legal, Financial Substance #1 Name of Substance 1: alcohol 1 - Age of First Use: 45 first time age 61 daily 1 - Amount (size/oz): 4 12 oz cans 1 - Frequency: daily 1 - Duration: 45 years 1 - Last Use / Amount: 07/16/17 Substance #2 Name of Substance 2: crack 2 - Age of First Use: 20 2 - Amount (size/oz): 6-7 grams 2 - Frequency: varies 2 - Duration: 45 years 2 - Last Use / Amount: 07/16/2017  CIWA: CIWA-Ar BP: (!) 158/76 Pulse Rate: 68 Nausea and Vomiting: no nausea and no vomiting Tactile Disturbances: mild itching, pins and needles, burning or numbness Tremor: no tremor Auditory Disturbances: not present Paroxysmal Sweats: barely perceptible sweating, palms moist Visual Disturbances: not present Anxiety: mildly anxious Headache, Fullness in Head: none present Agitation: normal activity Orientation and Clouding of Sensorium: oriented and can do serial additions CIWA-Ar Total: 4 COWS:    Allergies: No Known Allergies  Home Medications:  (Not in a hospital admission)  OB/GYN Status:  No LMP for male patient.  General Assessment Data Location of Assessment: [facility] ED TTS Assessment: In system Is this a Tele or Face-to-Face Assessment?: Tele Assessment Is this an Initial Assessment or a Re-assessment for this encounter?: Initial Assessment Marital status: Single Maiden name: NA Is patient pregnant?: Other  (Comment)(male) Living Arrangements: Alone, Other (Comment)(homeless) Can pt return to current living arrangement?: Yes Admission Status: Voluntary Is patient capable of signing voluntary admission?: Yes Referral Source: Self/Family/Friend Insurance type: Medicare     Crisis Care Plan Living Arrangements: Alone, Other (Comment)(homeless) Legal Guardian: Other:(Self) Name of Psychiatrist: none Name of Therapist: [facility]  Education Status Is patient currently in school?: No Is the patient employed, unemployed or receiving disability?: Unemployed  Risk to self with the past 6 months Suicidal Ideation: Yes-Currently Present Has patient been a risk to self within the past 6 months prior to admission? : Yes Suicidal Intent: No-Not Currently/Within Last 6 Months Has patient had any suicidal intent within the past 6 months prior to admission? : Yes Is patient  at risk for suicide?: Yes Suicidal Plan?: No-Not Currently/Within Last 6 Months Has patient had any suicidal plan within the past 6 months prior to admission? : Yes Access to Means: Yes Specify Access to Suicidal Means: can get to a high building What has been your use of drugs/alcohol within the last 12 months?: daily alcohol use, a recent crack binge Previous Attempts/Gestures: Yes How many times?: 2 Other Self Harm Risks: none Triggers for Past Attempts: Other (Comment)(alcohol use) Intentional Self Injurious Behavior: None Family Suicide History: No Recent stressful life event(s): Other (Comment)(legal issues realted to alcohol and homeless) Persecutory voices/beliefs?: No Depression: Yes Depression Symptoms: Insomnia, Tearfulness, Guilt, Feeling worthless/self pity, Loss of interest in usual pleasures, Feeling angry/irritable Substance abuse history and/or treatment for substance abuse?: Yes Suicide prevention information given to non-admitted patients: Not applicable  Risk to Others within the past 6 months Homicidal  Ideation: No Does patient have any lifetime risk of violence toward others beyond the six months prior to admission? : No Thoughts of Harm to Others: No Current Homicidal Intent: No Current Homicidal Plan: No Access to Homicidal Means: No Identified Victim: none History of harm to others?: No Assessment of Violence: None Noted Violent Behavior Description: none Does patient have access to weapons?: No Criminal Charges Pending?: Yes Describe Pending Criminal Charges: trespassing charge Does patient have a court date: Yes Court Date: 07/30/17 Is patient on probation?: Yes  Psychosis Hallucinations: None noted Delusions: None noted  Mental Status Report Appearance/Hygiene: In scrubs, Unremarkable Eye Contact: Good Motor Activity: Unremarkable Speech: Logical/coherent Level of Consciousness: Alert Mood: Depressed Affect: Depressed Anxiety Level: None Thought Processes: Coherent, Relevant Judgement: Impaired Orientation: Person, Place, Time, Situation, Appropriate for developmental age Obsessive Compulsive Thoughts/Behaviors: None  Cognitive Functioning Concentration: Normal Memory: Recent Intact, Remote Intact Is patient IDD: No Is patient DD?: No Insight: Poor Impulse Control: Poor Appetite: Good Have you had any weight changes? : No Change Sleep: No Change Total Hours of Sleep: 6(pt stated he doesn't know because he drinks self to sleep) Vegetative Symptoms: None  ADLScreening [facility] Assessment Services) Patient's cognitive ability adequate to safely complete daily activities?: Yes Patient able to express need for assistance with ADLs?: Yes Independently performs ADLs?: Yes (appropriate for developmental age)  Prior Inpatient Therapy Prior Inpatient Therapy: Yes Prior Therapy Dates: 2017 Prior Therapy Facilty/Provider(s): [facility] Reason for Treatment: alcohol use  Prior Outpatient Therapy Prior Outpatient Therapy: Yes Prior Therapy Dates: current Prior Therapy  Facilty/Provider(s): [facility] Reason for Treatment: alcohol use Does patient have an ACCT team?: No Does patient have Intensive In-House Services?  : No Does patient have [facility] services? : No Does patient have P4CC services?: No  ADL Screening (condition at time of admission) Patient's cognitive ability adequate to safely complete daily activities?: Yes Patient able to express need for assistance with ADLs?: Yes Independently performs ADLs?: Yes (appropriate for developmental age)       Abuse/Neglect Assessment (Assessment to be complete while patient is alone) Abuse/Neglect Assessment Can Be Completed: Yes Physical Abuse: Denies Verbal Abuse: Denies Sexual Abuse: Denies Exploitation of patient/patient's resources: Denies Self-Neglect: Denies Values / Beliefs Cultural Requests During Hospitalization: None Spiritual Requests During Hospitalization: None Consults Spiritual Care Consult Needed: No Social Work Consult Needed: No            Disposition:  Disposition Initial Assessment Completed for this Encounter: Yes   NP Lindon Romp recommends the pt be observed overnight for safety and stabilization.  RN Aaron Edelman and MD Little was made aware of  the recommendation.  This service was provided via telemedicine using a 2-way, interactive audio and video technology.  Names of all persons participating in this telemedicine service and their role in this encounter. Name: Kennie Snedden Role: Pt  Name: Virgina Organ Role: TTS  Name:  Role:   Name:  Role:     Enzo Montgomery 07/16/2017 10:53 PM

## 2017-07-16 NOTE — ED Notes (Signed)
PT BELONGINGS IN LOCKER 1 AND NEED TO BE INVENTORIED.

## 2017-07-16 NOTE — ED Triage Notes (Signed)
Pt reports that he is currently SI without a plan. Reports that he is severely depressed and recently went on a 2 day crack binge and drinks about 2 beers per day.

## 2017-07-16 NOTE — ED Notes (Signed)
Spoke with Reggie from staffing to report need for sitter.

## 2017-07-16 NOTE — ED Provider Notes (Signed)
[place] EMERGENCY DEPARTMENT Provider Note   CSN: [id number] Arrival date & time: 07/16/17  1821     History   Chief Complaint Chief Complaint  Patient presents with  . Suicidal    HPI Hunter Mcmillan is a 67 y.o. male.  67yo M w/ PMH below including CAD, substance abuse, stroke, hep C, depression who p/w suicidal ideation.  The patient reports a history of depression.  He has been on medications for depression previously but 6 weeks ago he was evicted from his halfway house and has had difficulty getting his medications from the house since that time.  He has been without his medications for the past 6 weeks.  He reports worsening depression since then.  Recently he went on a 2-day binge of drinking alcohol and doing crack cocaine.  Last drink was this evening.  He reports feeling suicidal with thoughts of jumping off of a roof.  He denies any previous suicide attempt.  He denies any auditory or visual hallucinations.  No history of alcohol withdrawal seizures.  He denies any recent fevers or illness.  The history is provided by the patient.    Past Medical History:  Diagnosis Date  . Alcoholism ([place])   . CAD (coronary artery disease) 11/13   a. 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 Chicken pox   . Depression   . ETOH abuse   . Genital warts   . Heart attack ([place])   . Hepatic steatosis   . Hepatitis C    a. s/p [name] ~ 2003 in [place], [place] => stopped after 6 mos due to neutropenia - viral load noted to be low at that time;  no follow up since;   b. abdominal u/s 11/13:  diffuse hepatic steatosis and/or hepatocellular disease    . Hx of echocardiogram    a. Echo 01/06/12: Mild focal basal hypertrophy the septum, vigorous LV, EF 65-70%, Gr 2  diast dysfn, trivial AI, MAC, trivial MR, trivial TR, PASP 36  . Hyperlipidemia   . Hypertension   . Non-STEMI (non-ST elevated myocardial infarction) ([place])   . Stroke [facility])    "minor" pt reported  . Thrombocytopenia ([place])   . Tobacco abuse     Patient Active Problem List   Diagnosis Date Noted  . Abdominal aortic aneurysm (AAA) 3.0 cm to 5.5 cm in diameter in male ([place]) 01/06/2017  . HTN (hypertension) 04/09/2015  . Hyperpigmentation of skin 10/05/2014  . COPD (chronic obstructive pulmonary disease) ([place]) 06/26/2014  . Sacroiliac joint dysfunction of right side 06/26/2014  . Alcoholism ([place])   . Cocaine abuse ([place]) 11/18/2013  . Scrotal mass 07/21/2013  . Cirrhosis ([place]) 05/28/2013  . Routine general medical examination at a health care facility 07/19/2012  . Abnormal TSH 04/09/2012  . Coronary atherosclerosis of native coronary artery 01/14/2012  . Hx of non-ST elevation myocardial infarction (NSTEMI) 01/05/2012  . Tobacco abuse 01/05/2012  . Chronic hepatitis C without hepatic coma ([place]) 01/05/2012  . Depression, recurrent ([place]) 01/05/2012  . Thrombocytopenia ([place]) 01/05/2012    Past Surgical History:  Procedure Laterality Date  . EYE SURGERY  1954   corrective surgery for crossed eyes  . EYE SURGERY  1959  . LEFT HEART CATHETERIZATION WITH CORONARY ANGIOGRAM N/A 01/05/2012  Procedure: LEFT HEART CATHETERIZATION WITH CORONARY ANGIOGRAM;  Surgeon: Larey Dresser, MD;  Location: [facility] CATH LAB;  Service: Cardiovascular;  Laterality: N/A;        Home Medications    Prior to Admission medications   Medication Sig Start Date End Date Taking? Authorizing Provider  albuterol (PROVENTIL HFA;VENTOLIN HFA) 108 (90 Base) MCG/ACT inhaler Inhale into the lungs every 6 (six) hours as needed for wheezing or shortness of breath.    [provider]  aspirin EC 81 MG tablet Take 1-2 tablets (81-162 mg total) daily by mouth. 01/06/17   Midge Minium, MD  atorvastatin  (LIPITOR) 40 MG tablet Take 1 tablet (40 mg total) daily by mouth. 01/06/17   Midge Minium, MD  beclomethasone (QVAR REDIHALER) 40 MCG/ACT inhaler Inhale 1 puff 2 (two) times daily into the lungs. Patient not taking: Reported on 05/25/2017 01/06/17   Midge Minium, MD  beclomethasone (QVAR) 40 MCG/ACT inhaler Inhale 1 puff into the lungs 2 (two) times daily. Patient not taking: Reported on 05/25/2017 06/26/14   Midge Minium, MD  citalopram (CELEXA) 40 MG tablet Take 1 tablet (40 mg total) daily by mouth. 01/06/17   Midge Minium, MD  fluticasone (FLOVENT HFA) 110 MCG/ACT inhaler Inhale 2 puffs into the lungs 2 (two) times daily. Patient not taking: Reported on 05/25/2017 02/05/17   Midge Minium, MD  ibuprofen (ADVIL,MOTRIN) 600 MG tablet Take 1 tablet (600 mg total) by mouth every 6 (six) hours as needed. 01/28/17   Fransico Meadow, PA-C  lisinopril (PRINIVIL,ZESTRIL) 2.5 MG tablet Take 1 tablet (2.5 mg total) daily by mouth. 01/06/17   Midge Minium, MD  Multiple Vitamin (MULTIVITAMIN) tablet Take 1 tablet by mouth daily.    [provider]  nitroGLYCERIN (NITROSTAT) 0.4 MG SL tablet Place 1 tablet (0.4 mg total) every 5 (five) minutes as needed under the tongue for chest pain. 01/06/17   Midge Minium, MD    Family History Family History  Problem Relation Age of Onset  . Heart attack Mother 71  . Diabetes Mother   . Heart disease Mother   . Hyperlipidemia Father   . Hypertension Father   . Diabetes Maternal Grandfather   . Heart disease Maternal Grandfather   . Early death Maternal Grandfather   . Heart attack Maternal Grandfather   . Hearing loss Sister   . Hyperlipidemia Sister   . Depression Brother   . Diabetes Brother   . Hyperlipidemia Brother   . Early death Maternal Grandmother   . Heart disease Maternal Grandmother   . Heart attack Maternal Grandmother   . Hearing loss Sister     Social History Social History   Tobacco Use   . Smoking status: Light Tobacco Smoker    Packs/day: 0.10    Types: Cigarettes  . Smokeless tobacco: Never Used  . Tobacco comment: almost quit, very rare, 1-2 daily  Substance Use Topics  . Alcohol use: Not Currently    Alcohol/week: 0.0 oz    Comment: stopped as of 01/2017  . Drug use: Yes    Types: Cocaine    Comment: history of cocaine last use 2014     Allergies   Patient has no known allergies.   Review of Systems Review of Systems All other systems reviewed and are negative except that which was mentioned in HPI   Physical Exam Updated Vital Signs BP 140/65 (BP Location: Right Arm)   Pulse 76   Temp  98.3 F (36.8 C) (Oral)   Resp 18   SpO2 95%   Physical Exam  Constitutional: He is oriented to person, place, and time. He appears well-developed. No distress.  Disheveled male, comfortable  HENT:  Head: Normocephalic and atraumatic.  Poor dentition  Eyes: Conjunctivae are normal.  Neck: Neck supple.  Cardiovascular: Normal rate, regular rhythm and normal heart sounds.  No murmur heard. Pulmonary/Chest: Effort normal and breath sounds normal.  Abdominal: Soft. Bowel sounds are normal. He exhibits no distension. There is no tenderness.  Musculoskeletal: He exhibits no edema.  Neurological: He is alert and oriented to person, place, and time.  Fluent speech  Skin: Skin is warm and dry.  Psychiatric:  Calm, good eye contact, at times tearful  Nursing note and vitals reviewed.    ED Treatments / Results  Labs (all labs ordered are listed, but only abnormal results are displayed) Labs Reviewed  COMPREHENSIVE METABOLIC PANEL - Abnormal; Notable for the following components:      Result Value   Total Protein 8.5 (*)    AST 42 (*)    All other components within normal limits  ETHANOL - Abnormal; Notable for the following components:   Alcohol, Ethyl (B) 15 (*)    All other components within normal limits  ACETAMINOPHEN LEVEL - Abnormal; Notable for the  following components:   Acetaminophen (Tylenol), Serum <10 (*)    All other components within normal limits  CBC - Abnormal; Notable for the following components:   Platelets 143 (*)    All other components within normal limits  SALICYLATE LEVEL  LIPASE, BLOOD  RAPID URINE DRUG SCREEN, HOSP PERFORMED    EKG None  Radiology No results found.  Procedures Procedures (including critical care time)  Medications Ordered in ED Medications  LORazepam (ATIVAN) injection 0-4 mg (has no administration in time range)    Or  LORazepam (ATIVAN) tablet 0-4 mg (has no administration in time range)  LORazepam (ATIVAN) injection 0-4 mg (has no administration in time range)    Or  LORazepam (ATIVAN) tablet 0-4 mg (has no administration in time range)  thiamine (VITAMIN B-1) tablet 100 mg (has no administration in time range)    Or  thiamine (B-1) injection 100 mg (has no administration in time range)  acetaminophen (TYLENOL) tablet 650 mg (has no administration in time range)  ondansetron (ZOFRAN) tablet 4 mg (has no administration in time range)  alum & mag hydroxide-simeth (MAALOX/MYLANTA) 200-200-20 MG/5ML suspension 30 mL (has no administration in time range)     Initial Impression / Assessment and Plan / ED Course  I have reviewed the triage vital signs and the nursing notes.  Pertinent labs that were available during my care of the patient were reviewed by me and considered in my medical decision making (see chart for details).     Pt calm and appropriate on exam, reassuring VS and screening lab work. He does admit to SI and worsening drug use 2/2 depression, therefore contacted TTS for evaluation.  His disposition will be determined by psychiatry team recommendations.  Final Clinical Impressions(s) / ED Diagnoses   Final diagnoses:  None    ED Discharge Orders    None       , Wenda Overland, MD 07/16/17 2150

## 2017-07-17 ENCOUNTER — Other Ambulatory Visit: Payer: Self-pay

## 2017-07-17 NOTE — ED Notes (Signed)
Unable to locate psych packet with appropriate paperwork - nurse who cared for pt prior to myself states no packet/paperwork was given to her at shift change this am

## 2017-07-17 NOTE — Progress Notes (Signed)
Patient assessed by Physician Nancy Nordmann, NP.  Patient does not meet inpatient criteria.  Pt referrals for SA and MH treatments faxed per Physician Extender's note.  [email or web address]  Psych ED notified.  Timmothy Euler. Kaylyn Lim, MSW, LCSWA Disposition Clinical Social Work [phone] (cell) [phone] (office)

## 2017-07-17 NOTE — ED Notes (Signed)
TTS in process 

## 2017-07-17 NOTE — ED Notes (Signed)
Spoke with Carney Bern from [facility] - states pt does not meet inpt requirement - recommends d/c to home - will give pt info on MH and substance abuse programs

## 2017-07-17 NOTE — ED Notes (Signed)
Pt had TTS session

## 2017-07-17 NOTE — ED Notes (Signed)
Pt received lunch tray and is cooperative  at this time.

## 2017-07-17 NOTE — ED Notes (Signed)
Pt moved to Pod F Room 8 - care assumed at this time; resting quietly on hospital bed with sitter at bedside - calm, cooperative, easily directed

## 2017-07-17 NOTE — ED Notes (Signed)
ED MD made aware of MH's recommendation for d/c

## 2017-07-17 NOTE — Progress Notes (Signed)
Patient is seen by me via tele-psych and I have consulted with Dr. Lucianne Muss.  Patient does not meet inpatient treatment criteria and is cleared psychiatrically.  Patient denies any SI/HI/AVH and contracts for safety.  Patient does request some assistance with follow-up treatment at possibly [facility] or [facility] residential.  Social work will be providing patient with information for follow-up as a walk-in at [facility] today.

## 2017-07-30 ENCOUNTER — Encounter (HOSPITAL_COMMUNITY): Payer: Self-pay

## 2017-07-30 ENCOUNTER — Emergency Department (HOSPITAL_COMMUNITY): Payer: Medicare Other

## 2017-07-30 DIAGNOSIS — J441 Chronic obstructive pulmonary disease with (acute) exacerbation: Secondary | ICD-10-CM | POA: Diagnosis not present

## 2017-07-30 DIAGNOSIS — R0789 Other chest pain: Secondary | ICD-10-CM | POA: Diagnosis not present

## 2017-07-30 DIAGNOSIS — R0602 Shortness of breath: Secondary | ICD-10-CM | POA: Diagnosis not present

## 2017-07-30 DIAGNOSIS — Z7982 Long term (current) use of aspirin: Secondary | ICD-10-CM | POA: Insufficient documentation

## 2017-07-30 DIAGNOSIS — F17210 Nicotine dependence, cigarettes, uncomplicated: Secondary | ICD-10-CM | POA: Insufficient documentation

## 2017-07-30 DIAGNOSIS — Z8673 Personal history of transient ischemic attack (TIA), and cerebral infarction without residual deficits: Secondary | ICD-10-CM | POA: Diagnosis not present

## 2017-07-30 DIAGNOSIS — I10 Essential (primary) hypertension: Secondary | ICD-10-CM | POA: Diagnosis not present

## 2017-07-30 DIAGNOSIS — Z79899 Other long term (current) drug therapy: Secondary | ICD-10-CM | POA: Diagnosis not present

## 2017-07-30 DIAGNOSIS — I2510 Atherosclerotic heart disease of native coronary artery without angina pectoris: Secondary | ICD-10-CM | POA: Diagnosis not present

## 2017-07-30 DIAGNOSIS — R071 Chest pain on breathing: Secondary | ICD-10-CM | POA: Diagnosis not present

## 2017-07-30 LAB — BASIC METABOLIC PANEL
Anion gap: 8 (ref 5–15)
BUN: 15 mg/dL (ref 6–20)
CO2: 28 mmol/L (ref 22–32)
Calcium: 9.6 mg/dL (ref 8.9–10.3)
Chloride: 106 mmol/L (ref 101–111)
Creatinine, Ser: 1.2 mg/dL (ref 0.61–1.24)
GFR calc Af Amer: >60 mL/min (ref >60)
GFR calc non Af Amer: >60 mL/min (ref >60)
Glucose, Bld: 104 mg/dL — ABNORMAL HIGH (ref 65–99)
Potassium: 4.3 mmol/L (ref 3.5–5.1)
Sodium: 142 mmol/L (ref 135–145)

## 2017-07-30 LAB — CBC
HCT: 44.5 % (ref 39.0–52.0)
Hemoglobin: 15.5 g/dL (ref 13.0–17.0)
MCH: 34.9 pg — ABNORMAL HIGH (ref 26.0–34.0)
MCHC: 34.8 g/dL (ref 30.0–36.0)
MCV: 100.2 fL — ABNORMAL HIGH (ref 78.0–100.0)
Platelets: 144 10*3/uL — ABNORMAL LOW (ref 150–400)
RBC: 4.44 MIL/uL (ref 4.22–5.81)
RDW: 14.6 % (ref 11.5–15.5)
WBC: 5.5 10*3/uL (ref 4.0–10.5)

## 2017-07-30 LAB — I-STAT TROPONIN, ED: Troponin i, poc: 0.01 ng/mL (ref 0.00–0.08)

## 2017-07-30 MED ORDER — ALBUTEROL SULFATE (2.5 MG/3ML) 0.083% IN NEBU
5.0000 mg | INHALATION_SOLUTION | Freq: Once | RESPIRATORY_TRACT | Status: DC
Start: 2017-07-30 — End: 2017-07-31

## 2017-07-30 NOTE — ED Triage Notes (Signed)
Pt reports SOB since this afternoon. He reports "pain in his lungs" with deep breathing. He also complains of LUQ pain. Denies N/V/D. Denies precipitating factors.

## 2017-07-31 ENCOUNTER — Emergency Department (HOSPITAL_COMMUNITY)
Admission: EM | Admit: 2017-07-31 | Discharge: 2017-07-31 | Disposition: A | Discharge status: Home / Self Care | Payer: Medicare Other | Attending: Emergency Medicine | Admitting: Emergency Medicine

## 2017-07-31 DIAGNOSIS — J441 Chronic obstructive pulmonary disease with (acute) exacerbation: Secondary | ICD-10-CM

## 2017-07-31 DIAGNOSIS — R0789 Other chest pain: Secondary | ICD-10-CM

## 2017-07-31 MED ORDER — IPRATROPIUM-ALBUTEROL 0.5-2.5 (3) MG/3ML IN SOLN
3.0000 mL | RESPIRATORY_TRACT | Status: DC
  Administered 2017-07-31: 3 mL via RESPIRATORY_TRACT
  Filled 2017-07-31: qty 3

## 2017-07-31 MED ORDER — DEXAMETHASONE SODIUM PHOSPHATE 10 MG/ML IJ SOLN
10.0000 mg | Freq: Once | INTRAMUSCULAR | Status: AC
  Administered 2017-07-31: 10 mg via INTRAMUSCULAR
  Filled 2017-07-31: qty 1

## 2017-07-31 NOTE — ED Provider Notes (Signed)
[facility] DEPT Provider Note: Georgena Spurling, MD, FACEP  CSN: [id number] MRN: [id number] ARRIVAL: 07/30/17 at 2115 ROOM: [place]  07/31/17 4:30 AM Hunter Mcmillan is a 67 y.o. male with a history of COPD.  He has an inhaler but has not been using it.  He got overheated yesterday afternoon when he was out in the sun.  He became short of breath with pain in his left lower chest wall, worse with deep breathing.  The pain was not severe.  He was given an albuterol neb treatment about 10:30 PM yesterday evening with significant improvement in his shortness of breath.  He feels much better now and is having less pain in his chest wall.  He denies nausea, vomiting or diarrhea.  Past Medical History:  Diagnosis Date  . Alcoholism ([place])   . CAD (coronary artery disease) 11/13   a. 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 Chicken pox   . Depression   . ETOH abuse   . Genital warts   . Heart attack ([place])   . Hepatic steatosis   . Hepatitis C    a. s/p [name] ~ 2003 in [place], [place] => stopped after 6 mos due to neutropenia - viral load noted to be low at that time;  no follow up since;   b. abdominal u/s 11/13:  diffuse hepatic steatosis and/or hepatocellular disease    . Hx of echocardiogram    a. Echo 01/06/12: Mild focal basal hypertrophy the septum, vigorous LV, EF 65-70%, Gr 2 diast dysfn, trivial AI, MAC, trivial MR, trivial TR, PASP 36  . Hyperlipidemia   . Hypertension   . Non-STEMI (non-ST elevated myocardial infarction) ([place])   . Stroke [facility])    "minor" pt reported  . Thrombocytopenia ([place])   . Tobacco abuse     Past Surgical History:  Procedure Laterality Date  . EYE SURGERY  1954   corrective  surgery for crossed eyes  . EYE SURGERY  1959  . LEFT HEART CATHETERIZATION WITH CORONARY ANGIOGRAM N/A 01/05/2012   Procedure: LEFT HEART CATHETERIZATION WITH CORONARY ANGIOGRAM;  Surgeon: Larey Dresser, MD;  Location: [facility] CATH LAB;  Service: Cardiovascular;  Laterality: N/A;    Family History  Problem Relation Age of Onset  . Heart attack Mother 79  . Diabetes Mother   . Heart disease Mother   . Hyperlipidemia Father   . Hypertension Father   . Diabetes Maternal Grandfather   . Heart disease Maternal Grandfather   . Early death Maternal Grandfather   . Heart attack Maternal Grandfather   . Hearing loss Sister   . Hyperlipidemia Sister   . Depression Brother   . Diabetes Brother   . Hyperlipidemia Brother   . Early death Maternal Grandmother   . Heart disease Maternal Grandmother   . Heart attack Maternal Grandmother   . Hearing loss Sister     Social History   Tobacco Use  . Smoking status: Light Tobacco Smoker    Packs/day: 0.10    Types: Cigarettes  . Smokeless tobacco: Never Used  . Tobacco comment: almost quit, very rare, 1-2 daily  Substance Use Topics  . Alcohol use: Not Currently    Alcohol/week:  0.0 oz    Comment: stopped as of 01/2017  . Drug use: Yes    Types: Cocaine    Comment: history of cocaine last use 2014    Prior to Admission medications   Medication Sig Start Date End Date Taking? Authorizing Provider  aspirin EC 81 MG tablet Take 1-2 tablets (81-162 mg total) daily by mouth. Patient not taking: Reported on 07/16/2017 01/06/17   Midge Minium, MD  atorvastatin (LIPITOR) 40 MG tablet Take 1 tablet (40 mg total) daily by mouth. Patient not taking: Reported on 07/16/2017 01/06/17   Midge Minium, MD  beclomethasone (QVAR REDIHALER) 40 MCG/ACT inhaler Inhale 1 puff 2 (two) times daily into the lungs. Patient not taking: Reported on 05/25/2017 01/06/17   Midge Minium, MD  beclomethasone (QVAR) 40 MCG/ACT inhaler Inhale 1 puff into the  lungs 2 (two) times daily. Patient not taking: Reported on 05/25/2017 06/26/14   Midge Minium, MD  citalopram (CELEXA) 40 MG tablet Take 1 tablet (40 mg total) daily by mouth. Patient not taking: Reported on 07/16/2017 01/06/17   Midge Minium, MD  fluticasone (FLOVENT HFA) 110 MCG/ACT inhaler Inhale 2 puffs into the lungs 2 (two) times daily. Patient not taking: Reported on 05/25/2017 02/05/17   Midge Minium, MD  ibuprofen (ADVIL,MOTRIN) 600 MG tablet Take 1 tablet (600 mg total) by mouth every 6 (six) hours as needed. Patient not taking: Reported on 07/16/2017 01/28/17   Fransico Meadow, PA-C  lisinopril (PRINIVIL,ZESTRIL) 2.5 MG tablet Take 1 tablet (2.5 mg total) daily by mouth. Patient not taking: Reported on 07/16/2017 01/06/17   Midge Minium, MD  nitroGLYCERIN (NITROSTAT) 0.4 MG SL tablet Place 1 tablet (0.4 mg total) every 5 (five) minutes as needed under the tongue for chest pain. 01/06/17   Midge Minium, MD    Allergies Patient has no known allergies.   REVIEW OF SYSTEMS  Negative except as noted here or in the History of Present Illness.   PHYSICAL EXAMINATION  Initial Vital Signs Blood pressure (!) 151/76, pulse 63, temperature (!) 97.4 F (36.3 C), temperature source Oral, resp. rate (!) 23, height _0  (1.803 m), weight 72.6 kg (160 lb), SpO2 99 %.  Examination General: Well-developed, well-nourished male in no acute distress; appearance consistent with age of record HENT: normocephalic; atraumatic Eyes: pupils equal, round and reactive to light; extraocular muscles intact Neck: supple Heart: regular rate and rhythm Lungs: Minimal expiratory wheezing with good air movement Chest: Mild left lower anterior rib tenderness Abdomen: soft; nondistended; nontender; bowel sounds present Extremities: No deformity; full range of motion; pulses normal Neurologic: Awake, alert and oriented; motor function intact in all extremities and symmetric; no  facial droop Skin: Warm and dry Psychiatric: Normal mood and affect   RESULTS  Summary of this visit's results, reviewed by myself:   EKG Interpretation  Date/Time:  Thursday Jul 30 2017 21:29:52 EDT Ventricular Rate:  82 PR Interval:  134 QRS Duration: 90 QT Interval:  376 QTC Calculation: 439 R Axis:   43 Text Interpretation:  Normal sinus rhythm Normal ECG No significant change was found Confirmed by Adelie Croswell [phone]) on 07/31/2017 4:31:13 AM      Laboratory Studies: Results for orders placed or performed during the hospital encounter of 07/31/17 (from the past 24 hour(s))  Basic metabolic panel     Status: Abnormal   Collection Time: 07/30/17 10:46 PM  Result Value Ref Range   Sodium 142 135 - 145 mmol/L  Potassium 4.3 3.5 - 5.1 mmol/L   Chloride 106 101 - 111 mmol/L   CO2 28 22 - 32 mmol/L   Glucose, Bld 104 (H) 65 - 99 mg/dL   BUN 15 6 - 20 mg/dL   Creatinine, Ser 1.20 0.61 - 1.24 mg/dL   Calcium 9.6 8.9 - 10.3 mg/dL   GFR calc non Af Amer >60 >60 mL/min   GFR calc Af Amer >60 >60 mL/min   Anion gap 8 5 - 15  CBC     Status: Abnormal   Collection Time: 07/30/17 10:46 PM  Result Value Ref Range   WBC 5.5 4.0 - 10.5 K/uL   RBC 4.44 4.22 - 5.81 MIL/uL   Hemoglobin 15.5 13.0 - 17.0 g/dL   HCT 44.5 39.0 - 52.0 %   MCV 100.2 (H) 78.0 - 100.0 fL   MCH 34.9 (H) 26.0 - 34.0 pg   MCHC 34.8 30.0 - 36.0 g/dL   RDW 14.6 11.5 - 15.5 %   Platelets 144 (L) 150 - 400 K/uL  I-stat troponin, ED     Status: None   Collection Time: 07/30/17 10:52 PM  Result Value Ref Range   Troponin i, poc 0.01 0.00 - 0.08 ng/mL   Comment 3           Imaging Studies: Dg Chest 2 View  Result Date: 07/30/2017 CLINICAL DATA:  Shortness of breath. EXAM: CHEST - 2 VIEW COMPARISON:  November 27, 2016 FINDINGS: The heart size and mediastinal contours are within normal limits. Both lungs are clear. The visualized skeletal structures are unremarkable. IMPRESSION: No active cardiopulmonary  disease. Electronically Signed   By: Dorise Bullion III M.D   On: 07/30/2017 22:44    ED COURSE and MDM  Nursing notes and initial vitals signs, including pulse oximetry, reviewed.  Vitals:   07/30/17 2130 07/31/17 0343 07/31/17 0352  BP: 137/76 (!) 156/83 (!) 151/76  Pulse: 80 60 63  Resp: (!) 24 20 (!) 23  Temp: 98.4 F (36.9 C) (!) 97.4 F (36.3 C)   TempSrc: Oral Oral   SpO2: 95% 97% 99%  Weight: 72.6 kg (160 lb)    Height: _0  (1.803 m)     The patient was advised to use his inhaler as needed.  We will give him a dose of steroids prior to discharge.  PROCEDURES    ED DIAGNOSES     ICD-10-CM   1. COPD exacerbation ([place]) J44.1   2. Chest wall pain R07.89        Shanon Rosser, MD 07/31/17 [phone]

## 2018-10-01 ENCOUNTER — Other Ambulatory Visit: Payer: Self-pay

## 2018-10-12 ENCOUNTER — Encounter (HOSPITAL_COMMUNITY): Payer: Self-pay | Admitting: Family Medicine

## 2018-10-12 ENCOUNTER — Other Ambulatory Visit: Payer: Self-pay

## 2018-10-12 ENCOUNTER — Emergency Department (HOSPITAL_COMMUNITY): Payer: Medicare Other

## 2018-10-12 ENCOUNTER — Emergency Department (HOSPITAL_COMMUNITY)
Admission: EM | Admit: 2018-10-12 | Discharge: 2018-10-12 | Disposition: A | Discharge status: Home / Self Care | Payer: Medicare Other | Attending: Emergency Medicine | Admitting: Emergency Medicine

## 2018-10-12 DIAGNOSIS — I2510 Atherosclerotic heart disease of native coronary artery without angina pectoris: Secondary | ICD-10-CM | POA: Insufficient documentation

## 2018-10-12 DIAGNOSIS — R52 Pain, unspecified: Secondary | ICD-10-CM | POA: Diagnosis not present

## 2018-10-12 DIAGNOSIS — J449 Chronic obstructive pulmonary disease, unspecified: Secondary | ICD-10-CM | POA: Diagnosis not present

## 2018-10-12 DIAGNOSIS — F1410 Cocaine abuse, uncomplicated: Secondary | ICD-10-CM | POA: Diagnosis not present

## 2018-10-12 DIAGNOSIS — R1011 Right upper quadrant pain: Secondary | ICD-10-CM | POA: Diagnosis not present

## 2018-10-12 DIAGNOSIS — R001 Bradycardia, unspecified: Secondary | ICD-10-CM | POA: Diagnosis not present

## 2018-10-12 DIAGNOSIS — R1084 Generalized abdominal pain: Secondary | ICD-10-CM | POA: Diagnosis not present

## 2018-10-12 DIAGNOSIS — K8020 Calculus of gallbladder without cholecystitis without obstruction: Secondary | ICD-10-CM | POA: Diagnosis not present

## 2018-10-12 DIAGNOSIS — F17210 Nicotine dependence, cigarettes, uncomplicated: Secondary | ICD-10-CM | POA: Insufficient documentation

## 2018-10-12 DIAGNOSIS — Z8673 Personal history of transient ischemic attack (TIA), and cerebral infarction without residual deficits: Secondary | ICD-10-CM | POA: Diagnosis not present

## 2018-10-12 DIAGNOSIS — I252 Old myocardial infarction: Secondary | ICD-10-CM | POA: Insufficient documentation

## 2018-10-12 DIAGNOSIS — K8050 Calculus of bile duct without cholangitis or cholecystitis without obstruction: Secondary | ICD-10-CM | POA: Diagnosis not present

## 2018-10-12 DIAGNOSIS — I10 Essential (primary) hypertension: Secondary | ICD-10-CM | POA: Diagnosis not present

## 2018-10-12 DIAGNOSIS — R112 Nausea with vomiting, unspecified: Secondary | ICD-10-CM | POA: Insufficient documentation

## 2018-10-12 DIAGNOSIS — R609 Edema, unspecified: Secondary | ICD-10-CM | POA: Diagnosis not present

## 2018-10-12 LAB — CBC
HCT: 44.2 % (ref 39.0–52.0)
Hemoglobin: 15.1 g/dL (ref 13.0–17.0)
MCH: 34.1 pg — ABNORMAL HIGH (ref 26.0–34.0)
MCHC: 34.2 g/dL (ref 30.0–36.0)
MCV: 99.8 fL (ref 80.0–100.0)
Platelets: 140 10*3/uL — ABNORMAL LOW (ref 150–400)
RBC: 4.43 MIL/uL (ref 4.22–5.81)
RDW: 14 % (ref 11.5–15.5)
WBC: 9.5 10*3/uL (ref 4.0–10.5)
nRBC: 0 % (ref 0.0–0.2)

## 2018-10-12 LAB — URINALYSIS, ROUTINE W REFLEX MICROSCOPIC
Bilirubin Urine: NEGATIVE
Glucose, UA: NEGATIVE mg/dL
Hgb urine dipstick: NEGATIVE
Ketones, ur: 5 mg/dL — AB
Nitrite: NEGATIVE
Protein, ur: NEGATIVE mg/dL
Specific Gravity, Urine: 1.026 (ref 1.005–1.030)
pH: 6 (ref 5.0–8.0)

## 2018-10-12 LAB — COMPREHENSIVE METABOLIC PANEL
ALT: 33 U/L (ref 0–44)
AST: 63 U/L — ABNORMAL HIGH (ref 15–41)
Albumin: 4.3 g/dL (ref 3.5–5.0)
Alkaline Phosphatase: 97 U/L (ref 38–126)
Anion gap: 11 (ref 5–15)
BUN: 24 mg/dL — ABNORMAL HIGH (ref 8–23)
CO2: 26 mmol/L (ref 22–32)
Calcium: 9.2 mg/dL (ref 8.9–10.3)
Chloride: 98 mmol/L (ref 98–111)
Creatinine, Ser: 1.43 mg/dL — ABNORMAL HIGH (ref 0.61–1.24)
GFR calc Af Amer: 58 mL/min — ABNORMAL LOW (ref >60)
GFR calc non Af Amer: 50 mL/min — ABNORMAL LOW (ref >60)
Glucose, Bld: 156 mg/dL — ABNORMAL HIGH (ref 70–99)
Potassium: 3.7 mmol/L (ref 3.5–5.1)
Sodium: 135 mmol/L (ref 135–145)
Total Bilirubin: 1.1 mg/dL (ref 0.3–1.2)
Total Protein: 7.9 g/dL (ref 6.5–8.1)

## 2018-10-12 LAB — LIPASE, BLOOD: Lipase: 27 U/L (ref 11–51)

## 2018-10-12 MED ORDER — FAMOTIDINE IN NACL 20-0.9 MG/50ML-% IV SOLN
20.0000 mg | Freq: Once | INTRAVENOUS | Status: AC
  Administered 2018-10-12: 20 mg via INTRAVENOUS
  Filled 2018-10-12: qty 50

## 2018-10-12 MED ORDER — HYDROCODONE-ACETAMINOPHEN 5-325 MG PO TABS: 1.0000 | ORAL_TABLET | Freq: Four times a day (QID) | ORAL | 0 refills | Status: DC | PRN

## 2018-10-12 MED ORDER — METOCLOPRAMIDE HCL 5 MG/ML IJ SOLN
10.0000 mg | Freq: Once | INTRAMUSCULAR | Status: AC
  Administered 2018-10-12: 10 mg via INTRAVENOUS
  Filled 2018-10-12: qty 2

## 2018-10-12 MED ORDER — ONDANSETRON HCL 4 MG PO TABS: 4.0000 mg | ORAL_TABLET | Freq: Three times a day (TID) | ORAL | 0 refills | Status: DC | PRN

## 2018-10-12 MED ORDER — SODIUM CHLORIDE 0.9 % IV BOLUS
1000.0000 mL | Freq: Once | INTRAVENOUS | Status: AC
  Administered 2018-10-12: 1000 mL via INTRAVENOUS

## 2018-10-12 MED ORDER — FENTANYL CITRATE (PF) 100 MCG/2ML IJ SOLN
50.0000 ug | Freq: Once | INTRAMUSCULAR | Status: AC
  Administered 2018-10-12: 50 ug via INTRAVENOUS
  Filled 2018-10-12: qty 2

## 2018-10-12 MED ORDER — SODIUM CHLORIDE 0.9% FLUSH
3.0000 mL | Freq: Once | INTRAVENOUS | Status: AC
  Administered 2018-10-12: 3 mL via INTRAVENOUS

## 2018-10-12 NOTE — ED Provider Notes (Signed)
  Physical Exam  BP 127/73   Pulse 60   Temp 98.7 F (37.1 C) (Oral)   Resp 16   Ht 5\' 11"  (1.803 m)   Wt 72.6 kg   SpO2 98%   BMI 22.32 kg/m   Physical Exam   Gen: Appears nontoxic Abd: Soft nontender  ED Course/Procedures     Procedures  MDM   Pt signed out to me by Ermalinda Memos, PA-C. Please see previous notes for further history.   In brief, pt presenting for evaluation of worsened abd pain after eating fried chicken last night. Work up consistent with biliary colic. No cholecystitis on [place]. Plan for symptom control with medication and f/u with surgery as needed.   On reassessment after pain control, pt is well-appearing.  Abdomen is nontender.  Tolerating p.o. without difficulty.  Discussed findings and plan.  Discussed symptomatic treatment at home, follow-up with general surgery as needed.  At this time, patient appears safe for discharge.  Return precautions given.  Patient states he understands and agrees to plan.       Franchot Heidelberg, PA-C 10/12/18 4503    Milton Ferguson, MD 10/13/18 [phone]

## 2018-10-12 NOTE — ED Notes (Signed)
Ultrasound at bedside

## 2018-10-12 NOTE — ED Notes (Signed)
Pt given water and saltine crackers.  

## 2018-10-12 NOTE — ED Provider Notes (Signed)
[facility] DEPT Provider Note   CSN: [id number] Arrival date & time: 10/12/18  0144    History   Chief Complaint Chief Complaint  Patient presents with   Abdominal Pain    HPI Hunter Mcmillan is a 68 y.o. male.     68 year old male with a history of CAD, and STEMI, dyslipidemia, hypertension, alcohol abuse presents to the emergency department for evaluation of right upper quadrant abdominal pain.  He describes a pressure-like pain which has been constant since 2200 tonight.  He had [place] fried chicken for dinner.  States that his pain feels similar to when he had biliary colic in the past.  Endorsing associated nausea with at least 4 episodes of nonbloody emesis.  He has had normal bowel movements and no urinary symptoms, fever.  No history of abdominal surgeries.  Denies taking any medications prior to arrival.  The history is provided by the patient. No language interpreter was used.  Abdominal Pain   Past Medical History:  Diagnosis Date   Alcoholism ([place])    CAD (coronary artery disease) 11/13   a. 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 s/p [name] ~ 2003 in [place], [place] => stopped after 6 mos due to neutropenia - viral load noted to be low at that time;  no follow up since;   b. abdominal u/s 11/13:  diffuse hepatic steatosis and/or hepatocellular disease     Hx of echocardiogram    a. Echo 01/06/12: Mild focal basal hypertrophy the septum, vigorous LV, EF 65-70%, Gr 2 diast dysfn, trivial AI, MAC, trivial MR, trivial TR, PASP 36   Hyperlipidemia    Hypertension    Non-STEMI (non-ST elevated  myocardial infarction) ([place])    Stroke ([place])    "minor" pt reported   Thrombocytopenia ([place])    Tobacco abuse     Patient Active Problem List   Diagnosis Date Noted   Abdominal aortic aneurysm (AAA) 3.0 cm to 5.5 cm in diameter in male ([place]) 01/06/2017   HTN (hypertension) 04/09/2015   Hyperpigmentation of skin 10/05/2014   COPD (chronic obstructive pulmonary disease) ([place]) 06/26/2014   Sacroiliac joint dysfunction of right side 06/26/2014   Alcoholism ([place])    Cocaine abuse ([place]) 11/18/2013   Scrotal mass 07/21/2013   Cirrhosis ([place]) 05/28/2013   Routine general medical examination at a health care facility 07/19/2012   Abnormal TSH 04/09/2012   Coronary atherosclerosis of native coronary artery 01/14/2012   Hx of non-ST elevation myocardial infarction (NSTEMI) 01/05/2012   Tobacco abuse 01/05/2012   Chronic hepatitis C without hepatic coma ([place]) 01/05/2012   Depression, recurrent ([place]) 01/05/2012   Thrombocytopenia ([place]) 01/05/2012    Past Surgical History:  Procedure Laterality Date   [place]   corrective surgery for crossed eyes   EYE SURGERY  1959   LEFT HEART CATHETERIZATION WITH CORONARY ANGIOGRAM N/A 01/05/2012   Procedure: LEFT HEART CATHETERIZATION WITH CORONARY ANGIOGRAM;  Surgeon: Larey Dresser, MD;  Location[facility] C[facility] LAB;  Service: Cardiovascular;  Laterality: N/A;  Home Medications    Prior to Admission medications   Medication Sig Start Date End Date Taking? Authorizing Provider  nitroGLYCERIN (NITROSTAT) 0.4 MG SL tablet Place 1 tablet (0.4 mg total) every 5 (five) minutes as needed under the tongue for chest pain. 01/06/17   Midge Minium, MD    Family History Family History  Problem Relation Age of Onset   Heart attack Mother 89   Diabetes Mother    Heart disease Mother    Hyperlipidemia Father    Hypertension Father    Diabetes Maternal Grandfather    Heart disease Maternal Grandfather     Early death Maternal Grandfather    Heart attack Maternal Grandfather    Hearing loss Sister    Hyperlipidemia Sister    Depression Brother    Diabetes Brother    Hyperlipidemia Brother    Early death Maternal Grandmother    Heart disease Maternal Grandmother    Heart attack Maternal Grandmother    Hearing loss Sister     Social History Social History   Tobacco Use   Smoking status: Light Tobacco Smoker    Packs/day: 0.10    Types: Cigarettes   Smokeless tobacco: Never Used   Tobacco comment: almost quit, very rare, 1-2 daily  Substance Use Topics   Alcohol use: Not Currently    Alcohol/week: 0.0 standard drinks    Comment: Last drink: 10 days ago    Drug use: Yes    Types: Cocaine    Comment: history of cocaine last use 2014     Allergies   Patient has no known allergies.   Review of Systems Review of Systems  Gastrointestinal: Positive for abdominal pain.  Ten systems reviewed and are negative for acute change, except as noted in the HPI.    Physical Exam Updated Vital Signs BP 127/73    Pulse 60    Temp 98.7 F (37.1 C) (Oral)    Resp 16    Ht _0  (1.803 m)    Wt 72.6 kg    SpO2 98%    BMI 22.32 kg/m   Physical Exam Vitals signs and nursing note reviewed.  Constitutional:      General: He is not in acute distress.    Appearance: He is well-developed. He is not diaphoretic.     Comments: Nontoxic-appearing and in no distress  HENT:     Head: Normocephalic and atraumatic.  Eyes:     General: No scleral icterus.    Conjunctiva/sclera: Conjunctivae normal.  Neck:     Musculoskeletal: Normal range of motion.  Cardiovascular:     Rate and Rhythm: Normal rate and regular rhythm.     Pulses: Normal pulses.  Pulmonary:     Effort: Pulmonary effort is normal. No respiratory distress.     Comments: Respirations even and unlabored Abdominal:     Comments: Abdomen soft, nondistended.  There is focal right upper quadrant tenderness.  Negative  Murphy sign.  No palpable masses or peritoneal signs.  Musculoskeletal: Normal range of motion.  Skin:    General: Skin is warm and dry.     Coloration: Skin is not pale.     Findings: No erythema or rash.  Neurological:     Mental Status: He is alert and oriented to person, place, and time.     Coordination: Coordination normal.     Comments: Ambulatory with steady gait  Psychiatric:        Behavior: Behavior normal.  ED Treatments / Results  Labs (all labs ordered are listed, but only abnormal results are displayed) Labs Reviewed  COMPREHENSIVE METABOLIC PANEL - Abnormal; Notable for the following components:      Result Value   Glucose, Bld 156 (*)    BUN 24 (*)    Creatinine, Ser 1.43 (*)    AST 63 (*)    GFR calc non Af Amer 50 (*)    GFR calc Af Amer 58 (*)    All other components within normal limits  CBC - Abnormal; Notable for the following components:   MCH 34.1 (*)    Platelets 140 (*)    All other components within normal limits  LIPASE, BLOOD  URINALYSIS, ROUTINE W REFLEX MICROSCOPIC    EKG None  Radiology US Abdomen Limited  Result Date: 10/12/2018 CLINICAL DATA:  Right upper quadrant pain with nausea and vomiting EXAM: ULTRASOUND ABDOMEN LIMITED RIGHT UPPER QUADRANT COMPARISON:  02/21/2015 FINDINGS: Gallbladder: Full gallbladder with small layering calculi towards the fundus. No wall thickening or pericholecystic edema. Reported negative sonographic Murphy sign. Common bile duct: Diameter: 4 mm.  Where visualized, no filling defect. Liver: No focal lesion identified. Within normal limits in parenchymal echogenicity. The fissures appear large and there is possible surface lobulation. Portal vein is patent on color Doppler imaging with normal direction of blood flow towards the liver. Other: None. IMPRESSION: Cholelithiasis without wall thickening or tenderness to suggest acute cholecystitis. Possible cirrhosis. Electronically Signed   By: Monte Fantasia  M.D.   On: 10/12/2018 06:51    Procedures Procedures (including critical care time)  Medications Ordered in ED Medications  sodium chloride flush (NS) 0.9 % injection 3 mL (has no administration in time range)  metoCLOPramide (REGLAN) injection 10 mg (has no administration in time range)  fentaNYL (SUBLIMAZE) injection 50 mcg (has no administration in time range)  sodium chloride 0.9 % bolus 1,000 mL (has no administration in time range)  famotidine (PEPCID) IVPB 20 mg premix (has no administration in time range)     Initial Impression / Assessment and Plan / ED Course  I have reviewed the triage vital signs and the nursing notes.  Pertinent labs & imaging results that were available during my care of the patient were reviewed by me and considered in my medical decision making (see chart for details).        Patient presenting for right upper quadrant abdominal pain which began after eating [place] fried chicken.  He had 4 episodes of vomiting prior to arrival.  Noted to have focal right upper quadrant tenderness on exam without peritoneal signs.  Laboratory evaluation has been reassuring without leukocytosis or significant LFT elevation.  He has no fevers.  His ultrasound shows stable cholelithiasis without gallbladder wall thickening or pericholecystic fluid.  Negative Murphy sign.  Patient pending medication administration for symptom control.  Will require repeat assessment as well as fluid challenge.  If he is feeling better, believe patient to be stable for outpatient follow-up and surgical referral.  Patient signed out to [place], PA-C at change of shift to will reassess and disposition appropriately.   Final Clinical Impressions(s) / ED Diagnoses   Final diagnoses:  Right upper quadrant abdominal pain  Biliary colic    ED Discharge Orders    None       Antonietta Breach, PA-C 10/12/18 0656    Fatima Blank, MD 10/12/18 [phone]

## 2018-10-12 NOTE — ED Triage Notes (Signed)
Patient is from home and transported via [facility]. Patient is complaining of right upper quad pain that started around 22:00 last night. He denies a fever but has had vomiting due to the pain. He is concerned this could be related to his eating a greasy, fatty meal earlier.

## 2018-10-12 NOTE — Discharge Instructions (Addendum)
Be careful with your food choices.  There is information about this in the paperwork. Use Tylenol and ibuprofen as needed for mild to moderate pain. Use Zofran as needed for nausea or vomiting. Use Norco as needed for severe breakthrough pain.  Have caution, this is a narcotic.  Do not drive or operate heavy machinery or take this medicine. Follow-up with general surgery as needed for continued or repeated pain. Return to the emergency room if you develop high fevers, persistent vomiting, severe worsening pain not improved with medication, or any new, worsening, concerning symptoms.

## 2018-12-13 ENCOUNTER — Emergency Department (HOSPITAL_COMMUNITY): Payer: Medicare Other

## 2018-12-13 ENCOUNTER — Encounter (HOSPITAL_COMMUNITY): Payer: Self-pay

## 2018-12-13 ENCOUNTER — Other Ambulatory Visit: Payer: Self-pay

## 2018-12-13 ENCOUNTER — Emergency Department (HOSPITAL_COMMUNITY)
Admission: EM | Admit: 2018-12-13 | Discharge: 2018-12-13 | Disposition: A | Discharge status: Home / Self Care | Payer: Medicare Other | Attending: Emergency Medicine | Admitting: Emergency Medicine

## 2018-12-13 DIAGNOSIS — M5116 Intervertebral disc disorders with radiculopathy, lumbar region: Secondary | ICD-10-CM | POA: Diagnosis not present

## 2018-12-13 DIAGNOSIS — R1032 Left lower quadrant pain: Secondary | ICD-10-CM | POA: Diagnosis not present

## 2018-12-13 DIAGNOSIS — I10 Essential (primary) hypertension: Secondary | ICD-10-CM | POA: Insufficient documentation

## 2018-12-13 DIAGNOSIS — Z20828 Contact with and (suspected) exposure to other viral communicable diseases: Secondary | ICD-10-CM | POA: Diagnosis not present

## 2018-12-13 DIAGNOSIS — F17210 Nicotine dependence, cigarettes, uncomplicated: Secondary | ICD-10-CM | POA: Diagnosis not present

## 2018-12-13 DIAGNOSIS — M25552 Pain in left hip: Secondary | ICD-10-CM | POA: Diagnosis not present

## 2018-12-13 DIAGNOSIS — I700 Atherosclerosis of aorta: Secondary | ICD-10-CM | POA: Diagnosis not present

## 2018-12-13 DIAGNOSIS — R0689 Other abnormalities of breathing: Secondary | ICD-10-CM | POA: Diagnosis not present

## 2018-12-13 DIAGNOSIS — Z79899 Other long term (current) drug therapy: Secondary | ICD-10-CM | POA: Insufficient documentation

## 2018-12-13 DIAGNOSIS — J449 Chronic obstructive pulmonary disease, unspecified: Secondary | ICD-10-CM | POA: Diagnosis not present

## 2018-12-13 DIAGNOSIS — M47817 Spondylosis without myelopathy or radiculopathy, lumbosacral region: Secondary | ICD-10-CM | POA: Diagnosis not present

## 2018-12-13 DIAGNOSIS — M5442 Lumbago with sciatica, left side: Secondary | ICD-10-CM | POA: Diagnosis not present

## 2018-12-13 DIAGNOSIS — R52 Pain, unspecified: Secondary | ICD-10-CM

## 2018-12-13 DIAGNOSIS — I2510 Atherosclerotic heart disease of native coronary artery without angina pectoris: Secondary | ICD-10-CM | POA: Insufficient documentation

## 2018-12-13 DIAGNOSIS — Z03818 Encounter for observation for suspected exposure to other biological agents ruled out: Secondary | ICD-10-CM | POA: Diagnosis not present

## 2018-12-13 DIAGNOSIS — M5126 Other intervertebral disc displacement, lumbar region: Secondary | ICD-10-CM | POA: Diagnosis not present

## 2018-12-13 DIAGNOSIS — S3991XA Unspecified injury of abdomen, initial encounter: Secondary | ICD-10-CM | POA: Diagnosis not present

## 2018-12-13 LAB — BASIC METABOLIC PANEL WITH GFR
Anion gap: 9 (ref 5–15)
BUN: 24 mg/dL — ABNORMAL HIGH (ref 8–23)
CO2: 25 mmol/L (ref 22–32)
Calcium: 9.3 mg/dL (ref 8.9–10.3)
Chloride: 103 mmol/L (ref 98–111)
Creatinine, Ser: 1.12 mg/dL (ref 0.61–1.24)
GFR calc Af Amer: >60 mL/min
GFR calc non Af Amer: >60 mL/min
Glucose, Bld: 94 mg/dL (ref 70–99)
Potassium: 4 mmol/L (ref 3.5–5.1)
Sodium: 137 mmol/L (ref 135–145)

## 2018-12-13 LAB — URINALYSIS, ROUTINE W REFLEX MICROSCOPIC
Bilirubin Urine: NEGATIVE
Glucose, UA: NEGATIVE mg/dL
Ketones, ur: 5 mg/dL — AB
Leukocytes,Ua: NEGATIVE
Nitrite: NEGATIVE
Protein, ur: NEGATIVE mg/dL
Specific Gravity, Urine: 1.008 (ref 1.005–1.030)
pH: 6 (ref 5.0–8.0)

## 2018-12-13 LAB — CBC WITH DIFFERENTIAL/PLATELET
Abs Immature Granulocytes: 0.02 10*3/uL (ref 0.00–0.07)
Basophils Absolute: 0 10*3/uL (ref 0.0–0.1)
Basophils Relative: 0 %
Eosinophils Absolute: 0.1 10*3/uL (ref 0.0–0.5)
Eosinophils Relative: 1 %
HCT: 45.1 % (ref 39.0–52.0)
Hemoglobin: 15 g/dL (ref 13.0–17.0)
Immature Granulocytes: 0 %
Lymphocytes Relative: 19 %
Lymphs Abs: 1.6 10*3/uL (ref 0.7–4.0)
MCH: 33.5 pg (ref 26.0–34.0)
MCHC: 33.3 g/dL (ref 30.0–36.0)
MCV: 100.7 fL — ABNORMAL HIGH (ref 80.0–100.0)
Monocytes Absolute: 0.4 10*3/uL (ref 0.1–1.0)
Monocytes Relative: 5 %
Neutro Abs: 6.3 10*3/uL (ref 1.7–7.7)
Neutrophils Relative %: 75 %
Platelets: 199 10*3/uL (ref 150–400)
RBC: 4.48 MIL/uL (ref 4.22–5.81)
RDW: 13.3 % (ref 11.5–15.5)
WBC: 8.4 10*3/uL (ref 4.0–10.5)
nRBC: 0 % (ref 0.0–0.2)

## 2018-12-13 LAB — PROTIME-INR
INR: 1.1 (ref 0.8–1.2)
Prothrombin Time: 13.7 seconds (ref 11.4–15.2)

## 2018-12-13 LAB — SARS CORONAVIRUS 2 (TAT 6-24 HRS): SARS Coronavirus 2: NEGATIVE

## 2018-12-13 MED ORDER — MORPHINE SULFATE (PF) 2 MG/ML IV SOLN
2.0000 mg | Freq: Once | INTRAVENOUS | Status: AC
  Administered 2018-12-13: 2 mg via INTRAVENOUS
  Filled 2018-12-13: qty 1

## 2018-12-13 MED ORDER — MORPHINE SULFATE (PF) 4 MG/ML IV SOLN
4.0000 mg | Freq: Once | INTRAVENOUS | Status: AC
  Administered 2018-12-13: 4 mg via INTRAVENOUS
  Filled 2018-12-13: qty 1

## 2018-12-13 MED ORDER — METHYLPREDNISOLONE 4 MG PO TBPK: ORAL_TABLET | ORAL | 0 refills | Status: DC

## 2018-12-13 MED ORDER — CYCLOBENZAPRINE HCL 10 MG PO TABS: 10.0000 mg | ORAL_TABLET | Freq: Two times a day (BID) | ORAL | 0 refills | Status: DC | PRN

## 2018-12-13 MED ORDER — METHYLPREDNISOLONE SODIUM SUCC 125 MG IJ SOLR
60.0000 mg | Freq: Once | INTRAMUSCULAR | Status: AC
  Administered 2018-12-13: 60 mg via INTRAVENOUS
  Filled 2018-12-13: qty 2

## 2018-12-13 MED ORDER — SODIUM CHLORIDE 0.9 % IV BOLUS
1000.0000 mL | Freq: Once | INTRAVENOUS | Status: AC
  Administered 2018-12-13: 1000 mL via INTRAVENOUS

## 2018-12-13 MED ORDER — KETOROLAC TROMETHAMINE 15 MG/ML IJ SOLN
15.0000 mg | Freq: Once | INTRAMUSCULAR | Status: AC
  Administered 2018-12-13: 15 mg via INTRAVENOUS
  Filled 2018-12-13: qty 1

## 2018-12-13 MED ORDER — SODIUM CHLORIDE (PF) 0.9 % IJ SOLN
INTRAMUSCULAR | Status: AC
  Filled 2018-12-13: qty 100

## 2018-12-13 MED ORDER — IOHEXOL 350 MG/ML SOLN
100.0000 mL | Freq: Once | INTRAVENOUS | Status: AC | PRN
  Administered 2018-12-13: 100 mL via INTRAVENOUS

## 2018-12-13 NOTE — ED Notes (Signed)
Patient transported to MRI 

## 2018-12-13 NOTE — ED Provider Notes (Signed)
[facility] DEPT Provider Note   CSN: [id number] Arrival date & time: 12/13/18  1013     History   Chief Complaint Chief Complaint  Patient presents with  . Hip Pain    HPI Hunter Mcmillan is a 68 y.o. male history hypertension, smoking, coronary artery disease, hyperlipidemia, presenting to the emergency department with left lower abdominal and hip pain.  He reports insidious onset of symptoms approximately 1 week ago.  He denies having any falls or trauma or injuries to his back.  He denies any heavy lifting or straining.  He reports the discomfort has been constant and progressively worsening over the past week.  It is a severe throbbing ache in his left lower abdomen and in his left hip which radiates halfway down his left thigh.  He has never had this kind of pain before.  The pain is worse with ambulation.  It is somewhat positional and he believes it may be improved with repositioning in bed at night, although he states it never goes away completely.  He has been taking Motrin 200-400 mg every 4 hours for 5 days with mild relief of his pain.  This morning he reports he was unable to bear any weight on his left leg due to severe pain in his left hip and back, and therefore came to the emergency department.  He denies any sensory deficits in his left lower extremity.    No fever, chills, numbness, or objective weakness on exam. No saddle anesthesia, urinary or fecal incontinence or retention. No reported history of immunosuppression, IV drug use, cancer, recent spinal surgery, recent trauma, or falls.   Denies hx of kidney stones.  Denies fevers, chills, diarrhea, dysuria.     HPI  Past Medical History:  Diagnosis Date  . Alcoholism ([place])   . CAD (coronary artery disease) 11/13   a. 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 Chicken pox   . Depression   . ETOH abuse   . Genital warts   . Heart attack ([place])   . Hepatic steatosis   . Hepatitis C    a. s/p [name] ~ 2003 in [place], [place] => stopped after 6 mos due to neutropenia - viral load noted to be low at that time;  no follow up since;   b. abdominal u/s 11/13:  diffuse hepatic steatosis and/or hepatocellular disease    . Hx of echocardiogram    a. Echo 01/06/12: Mild focal basal hypertrophy the septum, vigorous LV, EF 65-70%, Gr 2 diast dysfn, trivial AI, MAC, trivial MR, trivial TR, PASP 36  . Hyperlipidemia   . Hypertension   . Non-STEMI (non-ST elevated myocardial infarction) ([place])   . Stroke [facility])    "minor" pt reported  . Thrombocytopenia ([place])   . Tobacco abuse     Patient Active Problem List   Diagnosis Date Noted  . Abdominal aortic aneurysm (AAA) 3.0 cm to 5.5 cm in diameter in male ([place]) 01/06/2017  . HTN (hypertension) 04/09/2015  . Hyperpigmentation of skin 10/05/2014  . COPD (chronic obstructive pulmonary disease) ([place]) 06/26/2014  . Sacroiliac joint dysfunction of right side 06/26/2014  . Alcoholism ([place])   . Cocaine abuse ([place]) 11/18/2013  . Scrotal mass 07/21/2013  . Cirrhosis ([place]) 05/28/2013  .  Routine general medical examination at a health care facility 07/19/2012  . Abnormal TSH 04/09/2012  . Coronary atherosclerosis of native coronary artery 01/14/2012  . Hx of non-ST elevation myocardial infarction (NSTEMI) 01/05/2012  . Tobacco abuse 01/05/2012  . Chronic hepatitis C without hepatic coma ([place]) 01/05/2012  . Depression, recurrent ([place]) 01/05/2012  . Thrombocytopenia ([place]) 01/05/2012    Past Surgical History:  Procedure Laterality Date  . EYE SURGERY  1954   corrective surgery for crossed eyes  . EYE SURGERY  1959  . LEFT HEART CATHETERIZATION WITH CORONARY ANGIOGRAM N/A 01/05/2012   Procedure: LEFT HEART CATHETERIZATION WITH CORONARY  ANGIOGRAM;  Surgeon: Larey Dresser, MD;  Location: [facility] CATH LAB;  Service: Cardiovascular;  Laterality: N/A;        Home Medications    Prior to Admission medications   Medication Sig Start Date End Date Taking? Authorizing Provider  diphenhydrAMINE (BENADRYL) 25 MG tablet Take 25 mg by mouth every 6 (six) hours as needed for allergies.   Yes [provider]  ibuprofen (ADVIL) 200 MG tablet Take 600-800 mg by mouth every 6 (six) hours as needed (For pain.).   Yes [provider]  Multiple Vitamin (MULTIVITAMIN WITH MINERALS) TABS tablet Take 1 tablet by mouth daily.   Yes [provider]  nitroGLYCERIN (NITROSTAT) 0.4 MG SL tablet Place 1 tablet (0.4 mg total) every 5 (five) minutes as needed under the tongue for chest pain. 01/06/17  Yes Midge Minium, MD  ondansetron (ZOFRAN) 4 MG tablet Take 1 tablet (4 mg total) by mouth every 8 (eight) hours as needed for nausea or vomiting. 10/12/18  Yes Caccavale, Sophia, PA-C  cyclobenzaprine (FLEXERIL) 10 MG tablet Take 1 tablet (10 mg total) by mouth 2 (two) times daily as needed for muscle spasms. 12/13/18   Wyvonnia Dusky, MD  methylPREDNISolone (MEDROL DOSEPAK) 4 MG TBPK tablet Read instructions on pack 12/13/18   Classie Weng, Carola Rhine, MD    Family History Family History  Problem Relation Age of Onset  . Heart attack Mother 5  . Diabetes Mother   . Heart disease Mother   . Hyperlipidemia Father   . Hypertension Father   . Diabetes Maternal Grandfather   . Heart disease Maternal Grandfather   . Early death Maternal Grandfather   . Heart attack Maternal Grandfather   . Hearing loss Sister   . Hyperlipidemia Sister   . Depression Brother   . Diabetes Brother   . Hyperlipidemia Brother   . Early death Maternal Grandmother   . Heart disease Maternal Grandmother   . Heart attack Maternal Grandmother   . Hearing loss Sister     Social History Social History   Tobacco Use  . Smoking status: Light  Tobacco Smoker    Packs/day: 0.10    Types: Cigarettes  . Smokeless tobacco: Never Used  . Tobacco comment: almost quit, very rare, 1-2 daily  Substance Use Topics  . Alcohol use: Not Currently    Alcohol/week: 0.0 standard drinks    Comment: Last drink: 10 days ago   . Drug use: Yes    Types: Cocaine    Comment: history of cocaine last use 2014     Allergies   Patient has no known allergies.   Review of Systems Review of Systems  Constitutional: Negative for chills and fever.  Eyes: Negative for visual disturbance.  Respiratory: Negative for cough and shortness of breath.   Cardiovascular: Negative for chest pain and palpitations.  Gastrointestinal: Positive  for abdominal pain. Negative for constipation, diarrhea, nausea and vomiting.  Genitourinary: Negative for difficulty urinating, dysuria, frequency, hematuria, penile swelling, scrotal swelling and testicular pain.  Musculoskeletal: Positive for arthralgias, back pain, gait problem and myalgias. Negative for neck pain and neck stiffness.  Skin: Negative for pallor and rash.  Neurological: Negative for syncope and light-headedness.  Psychiatric/Behavioral: Negative for agitation and confusion.  All other systems reviewed and are negative.    Physical Exam Updated Vital Signs BP (!) 156/82 (BP Location: Right Arm)   Pulse 61   Temp 98.8 F (37.1 C) (Oral)   Resp 18   Ht _0  (1.803 m)   Wt 72.6 kg   SpO2 98%   BMI 22.32 kg/m   Physical Exam Vitals signs and nursing note reviewed.  Constitutional:      Appearance: He is well-developed.  HENT:     Head: Normocephalic and atraumatic.  Eyes:     Conjunctiva/sclera: Conjunctivae normal.  Neck:     Musculoskeletal: Neck supple.  Cardiovascular:     Rate and Rhythm: Normal rate and regular rhythm.     Comments: Left femoral pulse thready and weak compared to right Left pedal pulse not palpable, but audible on doppler Right femoral and pedal pulses +2  Pulmonary:     Effort: Pulmonary effort is normal. No respiratory distress.     Breath sounds: Normal breath sounds.  Abdominal:     General: There is no distension.     Palpations: Abdomen is soft. There is no mass.     Tenderness: There is no abdominal tenderness. There is no right CVA tenderness, left CVA tenderness, guarding or rebound.     Hernia: No hernia is present.     Comments: No pulsatile abd mass No audible bruit  Genitourinary:    Penis: Normal.      Scrotum/Testes: Normal.     Comments: No saddle anesthesia Musculoskeletal:     Comments: Full ROM of the left hip with no pain with full flexion, extension, abdcution and adduction No spinal midline bony tenderness  Skin:    General: Skin is warm and dry.  Neurological:     General: No focal deficit present.     Mental Status: He is alert and oriented to person, place, and time.     Comments: Negative straight leg test bilaterally      ED Treatments / Results  Labs (all labs ordered are listed, but only abnormal results are displayed) Labs Reviewed  BASIC METABOLIC PANEL - Abnormal; Notable for the following components:      Result Value   BUN 24 (*)    All other components within normal limits  CBC WITH DIFFERENTIAL/PLATELET - Abnormal; Notable for the following components:   MCV 100.7 (*)    All other components within normal limits  URINALYSIS, ROUTINE W REFLEX MICROSCOPIC - Abnormal; Notable for the following components:   Color, Urine STRAW (*)    Hgb urine dipstick SMALL (*)    Ketones, ur 5 (*)    Bacteria, UA RARE (*)    All other components within normal limits  SARS CORONAVIRUS 2 (TAT 6-24 HRS)  PROTIME-INR    EKG None  Radiology [name]  Result Date: 12/13/2018 CLINICAL DATA:  Radiculopathy for less than 6 weeks. No red flags. No prior management. The patient complains of 1 week of the alter pain extending into the left hip. No relief with over the counter medicines.  EXAM: MRI LUMBAR  SPINE WITHOUT CONTRAST TECHNIQUE: Multiplanar, multisequence MR imaging of the lumbar spine was performed. No intravenous contrast was administered. COMPARISON:  None. FINDINGS: Segmentation: 5 non rib-bearing lumbar type vertebral bodies are present. The lowest fully formed vertebral body is L5. Alignment: Slight retrolisthesis is evident at L1-2, L2-3, and L4-5. Leftward curvature is centered at L3-4. Vertebrae: There is diffuse fatty infiltration of the marrow spaces. Chronic fatty endplate marrow changes are most evident on the right at L3-4 and on the left at L5-S1. Conus medullaris and cauda equina: Conus extends to the T12-L1 level. Conus and cauda equina appear normal. Paraspinal and other soft tissues: Limited imaging the abdomen is unremarkable. There is no significant adenopathy. No solid organ lesions are present. No significant adenopathy is present. Disc levels: T12-L1: Negative. L1-2: A far left lateral disc protrusion is present. This may contact the nerve beyond the foramen. Moderate foraminal narrowing is worse left than right. L2-3: A broad-based disc protrusion is present. The central canal is patent. Mild foraminal narrowing is present bilaterally. L3-4: A rightward disc protrusion is present. Central canal is patent. Mild right foraminal narrowing is noted. L4-5: A broad-based disc protrusion is present. There is mild left subarticular narrowing. Moderate left and mild right foraminal narrowing is present. L5-S1: A leftward disc protrusion is present. Facet hypertrophy is worse on the left. Mild left foraminal narrowing is present. IMPRESSION: 1. No acute abnormality. 2. Scoliosis with multilevel spondylosis as described. 3. Far left lateral disc protrusion at L1-2 with moderate left foraminal narrowing and possible contact of the left L1 nerve root beyond the foramen. 4. Mild foraminal narrowing bilaterally at L2-3. 5. Mild right foraminal narrowing at L3-4. 6. Moderate left  and mild right foraminal narrowing at L4-5. 7. Mild left foraminal narrowing at L5-S1. Electronically Signed   By: San Morelle M.D.   On: 12/13/2018 17:01   [name]  Result Date: 12/13/2018 CLINICAL DATA:  Acute left hip pain EXAM: MR OF THE LEFT HIP WITHOUT CONTRAST TECHNIQUE: Multiplanar, multisequence MR imaging was performed. No intravenous contrast was administered. COMPARISON:  12/13/2018 x-ray FINDINGS: Bones: No acute fracture. No dislocation. No marrow edema. Incidental note of small synovial herniation pit at the anterolateral femoral head-neck junction. Otherwise, no bone lesion. No femoral head AVN. Discogenic endplate marrow changes within the visualized lumbar spine where there are prominent endplate osteophytosis. Articular cartilage and labrum Articular cartilage:  No chondral defect. Labrum: Intermediate intrasubstance signal within the anterosuperior labrum suggesting degeneration versus ossification. No linear fluid signal intensity to suggest acute tear. Joint or bursal effusion Joint effusion:  None. Bursae: No bursal fluid collections. Muscles and tendons Muscles and tendons: Normal muscle bulk and signal. Tendons about the hip are intact. Other findings Miscellaneous: No soft tissue fluid collection or hematoma. Marked colonic diverticulosis. No acute findings within the pelvis on provided sequences. IMPRESSION: 1. Negative for occult hip fracture. 2. Lumbar spondylosis, incompletely evaluated. 3. Extensive colonic diverticulosis. Electronically Signed   By: Davina Poke M.D.   On: 12/13/2018 17:11   Dg Hip Unilat With Pelvis 2-3 Views Left  Result Date: 12/13/2018 CLINICAL DATA:  Progressive left hip pain.  Unable to bear weight. EXAM: DG HIP (WITH OR WITHOUT PELVIS) 2-3V LEFT COMPARISON:  None. FINDINGS: Left hip is located. No acute or healing fractures are present. Visualized hemipelvis is within normal limits. Atherosclerotic changes are present.  IMPRESSION: 1. No acute or healing fracture. If the patient continues to be unable to bear weight, CT or  MRI of the left hip may be useful for further evaluation of occult fracture. 2. Atherosclerosis. Electronically Signed   By: San Morelle M.D.   On: 12/13/2018 11:45   Ct Angio Abd/pel W And/or Wo Contrast  Result Date: 12/13/2018 CLINICAL DATA:  68 year old male with a history of trauma EXAM: CTA ABDOMEN AND PELVIS WITH CONTRAST TECHNIQUE: Multidetector CT imaging of the abdomen and pelvis was performed using the standard protocol during bolus administration of intravenous contrast. Multiplanar reconstructed images and MIPs were obtained and reviewed to evaluate the vascular anatomy. CONTRAST:  145m OMNIPAQUE IOHEXOL 350 MG/ML SOLN COMPARISON:  None. FINDINGS: VASCULAR Aorta: Unremarkable course, caliber, contour of the abdominal aorta. Mild atherosclerotic changes of the visualized thoracic aorta and of the abdominal aorta. No dissection.  No aneurysm.  No periaortic fluid. Celiac: Patent, with no significant atherosclerotic changes. SMA: Patent, with no significant atherosclerotic changes. Renals: Atherosclerotic changes of the bilateral renal arteries without high-grade stenosis. IMA: Inferior mesenteric artery is patent. Right lower extremity: Moderate atherosclerotic changes of the right iliac system without high-grade stenosis or occlusion. Hypogastric artery is patent. External iliac artery is patent. Common femoral artery patent with mild to moderate atherosclerotic changes. Proximal profunda femoris and SFA patent. Left lower extremity: Moderate atherosclerotic changes of the left iliac system without high-grade stenosis or occlusion. Hypogastric artery is patent though appears to have at least 50% narrowing at the origin. External iliac artery patent. Common femoral artery patent with moderate atherosclerotic changes. Proximal profunda femoris and SFA patent. Veins: Unremarkable  appearance of the venous system. Review of the MIP images confirms the above findings. NON-VASCULAR Lower chest: No acute. Hepatobiliary: Unremarkable appearance of the liver. Unremarkable gall bladder. Pancreas: Unremarkable. Spleen: Unremarkable. Adrenals/Urinary Tract: Unremarkable appearance of adrenal glands. Right: No hydronephrosis. Symmetric perfusion to the left. No nephrolithiasis. Unremarkable course of the right ureter. Left: No hydronephrosis. Symmetric perfusion to the right. No nephrolithiasis. Unremarkable course of the left ureter. Unremarkable appearance of the urinary bladder . Stomach/Bowel: Unremarkable appearance of the stomach. Unremarkable appearance of small bowel. No evidence of obstruction. Normal appendix. Extensive diverticular change of the colon. No focal inflammatory changes to suggest acute diverticulitis. Lymphatic: No adenopathy. Mesenteric: No free fluid or air. No mesenteric adenopathy. Reproductive: Unremarkable appearance of the pelvic organs. Other: No hernia. Musculoskeletal: Multilevel degenerative changes of the visualized thoracolumbar spine. No bony canal narrowing. Vacuum disc phenomenon at L1-L2. Disc space narrowing and endplate changes throughout the lumbar spine. Greatest degree of facet hypertrophy present at L4-L5 and L5-S1. IMPRESSION: No acute arterial abnormality, with no CT evidence of acute aortic syndrome. Aortic Atherosclerosis (ICD10-I70.0). Associated bilateral iliac arterial disease without high-grade stenosis or occlusion. There is likely greater than 50% narrowing of the origin of the left hypogastric artery. Mesenteric and bilateral renal arterial disease without evidence of high-grade stenosis. Additional ancillary findings as above. Signed, JDulcy Fanny WDellia Nims RPVI [facility] Partnership[facility]iology Electronically Signed   By: JCorrie MckusickD.O.   On: 12/13/2018 14:02    Procedures Procedures (including  critical care time)  Medications Ordered in ED Medications  sodium chloride (PF) 0.9 % injection (has no administration in time range)  sodium chloride 0.9 % bolus 1,000 mL (0 mLs Intravenous Stopped 12/13/18 1420)  morphine 2 MG/ML injection 2 mg (2 mg Intravenous Given 12/13/18 1142)  iohexol (OMNIPAQUE) 350 MG/ML injection 100 mL (100 mLs Intravenous Contrast Given 12/13/18 1308)  morphine 4 MG/ML injection 4 mg (4 mg Intravenous Given 12/13/18 1422)  methylPREDNISolone sodium succinate (SOLU-MEDROL) 125 mg/2 mL injection 60 mg (60 mg Intravenous Given 12/13/18 1840)  ketorolac (TORADOL) 15 MG/ML injection 15 mg (15 mg Intravenous Given 12/13/18 1840)     Initial Impression / Assessment and Plan / ED Course  I have reviewed the triage vital signs and the nursing notes.  Pertinent labs & imaging results that were available during my care of the patient were reviewed by me and considered in my medical decision making (see chart for details).  68 year old gentleman with a history of vascular disease presenting to the emergency department with insidious onset of pain in his abdomen radiating down his left leg.  This appears to be worse with ambulation and activity.  On my exam he does have unequal pulses in the left lower extremity, which are weaker compared to the right.  He has no neurological deficits.  My immediate concern is for a vascular injury or occlusion, such as aortic dissection or iliac/femoral dissection or occlusion.  I will obtain a CT angiogram. He is hemodynamically stable at this time.  I do not believe he requires blood transfusion at this time.    Otherwise, the differential would include occult fracture of the left hip, supported by his inability to bear weight today. The caveat is that he has full range of motion and has very minimal pain or symptoms at the hip with motion.  There is also no reported trauma.  Likewise he has no bony tenderness of the lumbar spine to  suggest an occult fracture.   His symptoms may be due to lumbar disc radiculopathy or herniation.He has no signs or symptoms of cord compression or cauda equina at this time.  If CT imaging is negative, anticipate he may need MRI.  Clinical Course as of Dec 13 1914  Mon Dec 13, 2018  1154 IMPRESSION: 1. No acute or healing fracture. If the patient continues to be unable to bear weight, CT or MRI of the left hip may be useful for further evaluation of occult fracture. 2. Atherosclerosis.   [MT]  1410 Aortic Atherosclerosis (ICD10-I70.0). Associated bilateral iliac arterial disease without high-grade stenosis or occlusion. There is likely greater than 50% narrowing of the origin of the left hypogastric artery.   [MT]  [place] having significant pain despite morphine.  States he is unable to bear weight due to pain.  Will admit for MR imaging of L-spine and hip for occult fx evaluation   [MT]  1440 Spoke to hospitalist. I agreed to try to get MR imaging done while patient in ED.  If no acute findings, can discharge home on pain control   [MT]  1734 Work-up is complete.  Suspect the patient's symptoms are related to his L1 herniated disc.  No signs of cord impingement.  Will prescribe him Flexeril as well as a Medrol Dosepak and ibuprofen for the next week.  Have him follow-up with his doctor.   [MT]  7482 Patient was noted to be ambulating to the bathroom without any additional assistance.  It does not appear that he is truly unable to bear weight.   [MT]    Clinical Course User Index [MT] Wyvonnia Dusky, MD    Final Clinical Impressions(s) / ED Diagnoses   Final diagnoses:  Lumbar disc herniation with radiculopathy  Acute left-sided low back pain with left-sided sciatica    ED Discharge Orders         Ordered    methylPREDNISolone (MEDROL DOSEPAK) 4 MG TBPK  tablet     12/13/18 1751    cyclobenzaprine (FLEXERIL) 10 MG tablet  2 times daily PRN     12/13/18 1751            Wyvonnia Dusky, MD 12/13/18 1916

## 2018-12-13 NOTE — ED Notes (Addendum)
This RN checked in with patient regarding his ride prior to medication administration. Patient states his ride is not coming until he gets pain medication. This RN explained to patient that his ride needed to be en route due to his being discharged. Patient screamed at this RN stating "If youre not going to give me pain medicine, you need to get the fuck out!" This RN explained I would be bringing him medication but that I was verifying he had a safe ride home. Patient continued to scream at this RN, cussing, and screaming "Get the fuck out," and, "If you arent giving me pain medicine, you need to get the fuck out, bitch!" This RN stepped out and got Joy NT to come into the room with me for discharge.

## 2018-12-13 NOTE — ED Notes (Signed)
This RN updated patient on discharge status and asked patient to call ride. Patient requesting pain medication before he leaves. Patient updated that medication had been ordered and that I would be bringing it to him momentarily. In the meantime, patient asked to call his ride to come to the ED for pick up. Patient verbalized understanding.

## 2018-12-13 NOTE — ED Triage Notes (Signed)
Pt arrived [facility] from home CC 1 week dull to sharp pain left hip. Pt reports no relief with OTC meds. Pt denies falling or previous/surgey. EMS reports pt ambulatory with cane. A/OX4 VSS.

## 2018-12-13 NOTE — ED Notes (Addendum)
Discharge instructions reviewed with patient. Patient verbalizes understanding. VSS. Patient wheeled from ED in wheelchair, per request. Patient states he has called a ride to pick him up and he will wait for them outside the EMS for pick up. Patient angry that he is being discharged, stating "Ill [facility] go to [facility] to get help, since yall didn't do shit for me!"

## 2018-12-13 NOTE — Discharge Instructions (Addendum)
Please follow up with your doctor in 1 week.  You have  herniated disc in your lower (lumbar) spine.  This is likely causing your pain.  Please read over the attached instructions for reasons to come back to the ER.  You can take advil (ibuprofen) 600 mg every 6 hours as needed for pain.  Do not take this for longer than 7 days.  Long term usage of this medicine can cause kidney problems and stomach ulcers.

## 2018-12-21 ENCOUNTER — Other Ambulatory Visit: Payer: Self-pay

## 2018-12-21 NOTE — Progress Notes (Signed)
[place] [facility] [address], [place], [place] [address] [phone]  Date:  12/22/2018   Name:  Hunter Mcmillan   DOB:  1950/08/02   MRN:  [id number]  PCP:  [name], MD    Chief Complaint: ER follow up (abdominal pain, feels like a balloon, ), Back Pain (lower back , left side, herniated disc), and Flu Vaccine   History of Present Illness:  Hunter Mcmillan is a 68 y.o. very pleasant male patient who presents with the following:  Here today for ER follow-up Hunter Mcmillan has history of AAA, hypertension, CKD, substance abuse, history of STEMI, hep C status post Harvoni treatment.  He was seen in the ER on October 12 with abdominal and hip pain.  Thought due to an L1 herniated disc.  He was given Flexeril and a Medrol Dosepak.  He notes an ache in his left hip muscles, but the shooting pains are much better. He is able to walk ok again He will occasionally have pain go down his left leg, but this is much improved  He finished the steroid a couple of days ago and also the flexeril He is now using advil and tylenol as needed No bowel or bladder control issues  He had extensive imaging per the ER as below- most pertinent being his MRI Lumbar spine: MPRESSION: 1. No acute abnormality. 2. Scoliosis with multilevel spondylosis as described. 3. Far left lateral disc protrusion at L1-2 with moderate left foraminal narrowing and possible contact of the left L1 nerve root beyond the foramen. 4. Mild foraminal narrowing bilaterally at L2-3. 5. Mild right foraminal narrowing at L3-4. 6. Moderate left and mild right foraminal narrowing at L4-5. 7. Mild left foraminal narrowing at L5-S1.  I have seen this patient once in the past, March 2019.  At that point he had been off drugs and alcohol for several months and was generally doing well.  We did a hep C RNA test which was negative at that time, consistent with cure  Flu shot:  give today  Colon UTD Can update pneumovax since he is over 65  Recent BMP and CBC only on chart   Hunter Mcmillan  Result Date: 12/13/2018 CLINICAL DATA:  Radiculopathy for less than 6 weeks. No red flags. No prior management. The patient complains of 1 week of the alter pain extending into the left hip. No relief with over the counter medicines. EXAM: MRI LUMBAR SPINE WITHOUT Mcmillan TECHNIQUE: Multiplanar, multisequence Hunter imaging of the lumbar spine was performed. No intravenous Mcmillan was administered. COMPARISON:  None. FINDINGS: Segmentation: 5 non rib-bearing lumbar type vertebral bodies are present. The lowest fully formed vertebral body is L5. Alignment: Slight retrolisthesis is evident at L1-2, L2-3, and L4-5. Leftward curvature is centered at L3-4. Vertebrae: There is diffuse fatty infiltration of the marrow spaces. Chronic fatty endplate marrow changes are most evident on the right at L3-4 and on the left at L5-S1. Conus medullaris and cauda equina: Conus extends to the T12-L1 level. Conus and cauda equina appear normal. Paraspinal and other soft tissues: Limited imaging the abdomen is unremarkable. There is no significant adenopathy. No solid organ lesions are present. No significant adenopathy is present. Disc levels: T12-L1: Negative. L1-2: A far left lateral disc protrusion is present. This may contact the nerve beyond the foramen. Moderate foraminal narrowing is worse left than right. L2-3: A broad-based disc protrusion is present. The central  canal is patent. Mild foraminal narrowing is present bilaterally. L3-4: A rightward disc protrusion is present. Central canal is patent. Mild right foraminal narrowing is noted. L4-5: A broad-based disc protrusion is present. There is mild left subarticular narrowing. Moderate left and mild right foraminal narrowing is present. L5-S1: A leftward disc protrusion is present. Facet hypertrophy is worse on the left. Mild left foraminal  narrowing is present. IMPRESSION: 1. No acute abnormality. 2. Scoliosis with multilevel spondylosis as described. 3. Far left lateral disc protrusion at L1-2 with moderate left foraminal narrowing and possible contact of the left L1 nerve root beyond the foramen. 4. Mild foraminal narrowing bilaterally at L2-3. 5. Mild right foraminal narrowing at L3-4. 6. Moderate left and mild right foraminal narrowing at L4-5. 7. Mild left foraminal narrowing at L5-S1. Electronically Signed   By: San Morelle M.D.   On: 12/13/2018 17:01   Hunter Hip Left Wo Mcmillan  Result Date: 12/13/2018 CLINICAL DATA:  Acute left hip pain EXAM: Hunter OF THE LEFT HIP WITHOUT Mcmillan TECHNIQUE: Multiplanar, multisequence Hunter imaging was performed. No intravenous Mcmillan was administered. COMPARISON:  12/13/2018 x-ray FINDINGS: Bones: No acute fracture. No dislocation. No marrow edema. Incidental note of small synovial herniation pit at the anterolateral femoral head-neck junction. Otherwise, no bone lesion. No femoral head AVN. Discogenic endplate marrow changes within the visualized lumbar spine where there are prominent endplate osteophytosis. Articular cartilage and labrum Articular cartilage:  No chondral defect. Labrum: Intermediate intrasubstance signal within the anterosuperior labrum suggesting degeneration versus ossification. No linear fluid signal intensity to suggest acute tear. Joint or bursal effusion Joint effusion:  None. Bursae: No bursal fluid collections. Muscles and tendons Muscles and tendons: Normal muscle bulk and signal. Tendons about the hip are intact. Other findings Miscellaneous: No soft tissue fluid collection or hematoma. Marked colonic diverticulosis. No acute findings within the pelvis on provided sequences. IMPRESSION: 1. Negative for occult hip fracture. 2. Lumbar spondylosis, incompletely evaluated. 3. Extensive colonic diverticulosis. Electronically Signed   By: Davina Poke M.D.   On: 12/13/2018  17:11   Dg Hip Unilat With Pelvis 2-3 Views Left  Result Date: 12/13/2018 CLINICAL DATA:  Progressive left hip pain.  Unable to bear weight. EXAM: DG HIP (WITH OR WITHOUT PELVIS) 2-3V LEFT COMPARISON:  None. FINDINGS: Left hip is located. No acute or healing fractures are present. Visualized hemipelvis is within normal limits. Atherosclerotic changes are present. IMPRESSION: 1. No acute or healing fracture. If the patient continues to be unable to bear weight, CT or MRI of the left hip may be useful for further evaluation of occult fracture. 2. Atherosclerosis. Electronically Signed   By: San Morelle M.D.   On: 12/13/2018 11:45   Ct Angio Abd/pel W And/or Wo Mcmillan  Result Date: 12/13/2018 CLINICAL DATA:  68 year old male with a history of trauma EXAM: CTA ABDOMEN AND PELVIS WITH Mcmillan TECHNIQUE: Multidetector CT imaging of the abdomen and pelvis was performed using the standard protocol during bolus administration of intravenous Mcmillan. Multiplanar reconstructed images and MIPs were obtained and reviewed to evaluate the vascular anatomy. Mcmillan:  161m OMNIPAQUE IOHEXOL 350 MG/ML SOLN COMPARISON:  None. FINDINGS: VASCULAR Aorta: Unremarkable course, caliber, contour of the abdominal aorta. Mild atherosclerotic changes of the visualized thoracic aorta and of the abdominal aorta. No dissection.  No aneurysm.  No periaortic fluid. Celiac: Patent, with no significant atherosclerotic changes. SMA: Patent, with no significant atherosclerotic changes. Renals: Atherosclerotic changes of the bilateral renal arteries without high-grade stenosis. IMA: Inferior mesenteric artery  is patent. Right lower extremity: Moderate atherosclerotic changes of the right iliac system without high-grade stenosis or occlusion. Hypogastric artery is patent. External iliac artery is patent. Common femoral artery patent with mild to moderate atherosclerotic changes. Proximal profunda femoris and SFA patent. Left lower  extremity: Moderate atherosclerotic changes of the left iliac system without high-grade stenosis or occlusion. Hypogastric artery is patent though appears to have at least 50% narrowing at the origin. External iliac artery patent. Common femoral artery patent with moderate atherosclerotic changes. Proximal profunda femoris and SFA patent. Veins: Unremarkable appearance of the venous system. Review of the MIP images confirms the above findings. NON-VASCULAR Lower chest: No acute. Hepatobiliary: Unremarkable appearance of the liver. Unremarkable gall bladder. Pancreas: Unremarkable. Spleen: Unremarkable. Adrenals/Urinary Tract: Unremarkable appearance of adrenal glands. Right: No hydronephrosis. Symmetric perfusion to the left. No nephrolithiasis. Unremarkable course of the right ureter. Left: No hydronephrosis. Symmetric perfusion to the right. No nephrolithiasis. Unremarkable course of the left ureter. Unremarkable appearance of the urinary bladder . Stomach/Bowel: Unremarkable appearance of the stomach. Unremarkable appearance of small bowel. No evidence of obstruction. Normal appendix. Extensive diverticular change of the colon. No focal inflammatory changes to suggest acute diverticulitis. Lymphatic: No adenopathy. Mesenteric: No free fluid or air. No mesenteric adenopathy. Reproductive: Unremarkable appearance of the pelvic organs. Other: No hernia. Musculoskeletal: Multilevel degenerative changes of the visualized thoracolumbar spine. No bony canal narrowing. Vacuum disc phenomenon at L1-L2. Disc space narrowing and endplate changes throughout the lumbar spine. Greatest degree of facet hypertrophy present at L4-L5 and L5-S1. IMPRESSION: No acute arterial abnormality, with no CT evidence of acute aortic syndrome. Aortic Atherosclerosis (ICD10-I70.0). Associated bilateral iliac arterial disease without high-grade stenosis or occlusion. There is likely greater than 50% narrowing of the origin of the left  hypogastric artery. Mesenteric and bilateral renal arterial disease without evidence of high-grade stenosis. Additional ancillary findings as above. Signed, Dulcy Fanny. Dellia Nims, RPVI [facility] Electronically Signed   By: Corrie Mckusick D.O.   On: 12/13/2018 14:02      Patient Active Problem List   Diagnosis Date Noted  . Abdominal aortic aneurysm (AAA) 3.0 cm to 5.5 cm in diameter in male ([place]) 01/06/2017  . HTN (hypertension) 04/09/2015  . Hyperpigmentation of skin 10/05/2014  . COPD (chronic obstructive pulmonary disease) ([place]) 06/26/2014  . Sacroiliac joint dysfunction of right side 06/26/2014  . Alcoholism ([place])   . Cocaine abuse ([place]) 11/18/2013  . Scrotal mass 07/21/2013  . Cirrhosis ([place]) 05/28/2013  . Routine general medical examination at a health care facility 07/19/2012  . Abnormal TSH 04/09/2012  . Coronary atherosclerosis of native coronary artery 01/14/2012  . Hx of non-ST elevation myocardial infarction (NSTEMI) 01/05/2012  . Tobacco abuse 01/05/2012  . Chronic hepatitis C without hepatic coma ([place]) 01/05/2012  . Depression, recurrent ([place]) 01/05/2012  . Thrombocytopenia ([place]) 01/05/2012    Past Medical History:  Diagnosis Date  . Alcoholism ([place])   . CAD (coronary artery disease) 11/13   a. 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 Chicken pox   . Depression   . ETOH abuse   . Genital warts   . Heart attack ([place])   . Hepatic steatosis   . Hepatitis C    a. s/p [name] ~ 2003  in [place], [place] => stopped after 6 mos due to neutropenia - viral load noted to be low at that time;  no follow up since;   b. abdominal u/s 11/13:  diffuse hepatic steatosis and/or hepatocellular disease    . Hx of  echocardiogram    a. Echo 01/06/12: Mild focal basal hypertrophy the septum, vigorous LV, EF 65-70%, Gr 2 diast dysfn, trivial AI, MAC, trivial Hunter, trivial TR, PASP 36  . Hyperlipidemia   . Hypertension   . Non-STEMI (non-ST elevated myocardial infarction) ([place])   . Stroke [facility])    "minor" pt reported  . Thrombocytopenia ([place])   . Tobacco abuse     Past Surgical History:  Procedure Laterality Date  . EYE SURGERY  1954   corrective surgery for crossed eyes  . EYE SURGERY  1959  . LEFT HEART CATHETERIZATION WITH CORONARY ANGIOGRAM N/A 01/05/2012   Procedure: LEFT HEART CATHETERIZATION WITH CORONARY ANGIOGRAM;  Surgeon: Larey Dresser, MD;  Location: [facility] CATH LAB;  Service: Cardiovascular;  Laterality: N/A;    Social History   Tobacco Use  . Smoking status: Light Tobacco Smoker    Packs/day: 0.10    Types: Cigarettes  . Smokeless tobacco: Never Used  . Tobacco comment: almost quit, very rare, 1-2 daily  Substance Use Topics  . Alcohol use: Not Currently    Alcohol/week: 0.0 standard drinks    Comment: Last drink: 10 days ago   . Drug use: Yes    Types: Cocaine    Comment: history of cocaine last use 2014    Family History  Problem Relation Age of Onset  . Heart attack Mother 68  . Diabetes Mother   . Heart disease Mother   . Hyperlipidemia Father   . Hypertension Father   . Diabetes Maternal Grandfather   . Heart disease Maternal Grandfather   . Early death Maternal Grandfather   . Heart attack Maternal Grandfather   . Hearing loss Sister   . Hyperlipidemia Sister   . Depression Brother   . Diabetes Brother   . Hyperlipidemia Brother   . Early death Maternal Grandmother   . Heart disease Maternal Grandmother   . Heart attack Maternal Grandmother   . Hearing loss Sister     No Known Allergies  Medication list has been reviewed and updated.  Current Outpatient Medications on File Prior to Visit  Medication Sig Dispense Refill  . diphenhydrAMINE (BENADRYL) 25  MG tablet Take 25 mg by mouth every 6 (six) hours as needed for allergies.    Marland Kitchen ibuprofen (ADVIL) 200 MG tablet Take 600-800 mg by mouth every 6 (six) hours as needed (For pain.).    Marland Kitchen Multiple Vitamin (MULTIVITAMIN WITH MINERALS) TABS tablet Take 1 tablet by mouth daily.    . nitroGLYCERIN (NITROSTAT) 0.4 MG SL tablet Place 1 tablet (0.4 mg total) every 5 (five) minutes as needed under the tongue for chest pain. 10 tablet 1  . cyclobenzaprine (FLEXERIL) 10 MG tablet Take 1 tablet (10 mg total) by mouth 2 (two) times daily as needed for muscle spasms. (Patient not taking: Reported on 12/22/2018) 20 tablet 0  . ondansetron (ZOFRAN) 4 MG tablet Take 1 tablet (4 mg total) by mouth every 8 (eight) hours as needed for nausea or vomiting. (Patient not taking: Reported on 12/22/2018) 12 tablet 0   No current facility-administered medications on file prior to visit.     Review of Systems:  As per HPI- otherwise negative.   Physical Examination: Vitals:  12/22/18 1106  BP: 140/80  Pulse: 81  Resp: 19  Temp: (!) 96.8 F (36 C)  SpO2: 98%   Vitals:   12/22/18 1106  Weight: 151 lb (68.5 kg)  Height: _0  (1.803 m)   Body mass index is 21.06 kg/m. Ideal Body Weight: Weight in (lb) to have BMI = 25: 178.9  GEN: WDWN, NAD, Non-toxic, A & O x 3, slender build, looks well HEENT: Atraumatic, Normocephalic. Neck supple. No masses, No LAD. Ears and Nose: No external deformity. CV: RRR, No M/G/R. No JVD. No thrill. No extra heart sounds. PULM: CTA B, no wheezes, crackles, rhonchi. No retractions. No resp. distress. No accessory muscle use. ABD: S, NT, ND EXTR: No c/c/e NEURO Normal gait.  PSYCH: Normally interactive.Conversant. Not depressed or anxious appearing.  Calm demeanor.  He notes an ache in the left sided lumbar paraspinous muscles.  Bilateral lower extremity strength and sensation is normal, negative straight leg raise, normal patellar reflexes bilaterally  Assessment and  Plan: Gallstones - Plan: Ambulatory referral to General Surgery  Essential hypertension  Lumbar disc disease - Plan: cyclobenzaprine (FLEXERIL) 10 MG tablet  Hx of non-ST elevation myocardial infarction (NSTEMI) - Plan: nitroGLYCERIN (NITROSTAT) 0.4 MG SL tablet  Immunization due - Plan: Pneumococcal polysaccharide vaccine 23-valent greater than or equal to 2yo subcutaneous/IM  Needs flu shot - Plan: Flu Vaccine QUAD High Dose(Fluad)  Following up today after a long absence-he was recently seen in the ER with a bulging lumbar disc and associated pain He is doing much better in this regard, has finished steroids.  Refilled his Flexeril to use as needed If symptoms return we plan to repeat steroids, failing this will refer to neurosurgery Updated flu and pneumonia vaccines today Refilled nitroglycerin, he has not needed to use this but his prescription has expired Blood pressures under reasonable control Patient endorses 3 episodes of gallstone colic.  He is interested in an elective cholecystectomy.  Referral to general surgery made  Signed Lamar Blinks, MD

## 2018-12-22 ENCOUNTER — Encounter: Payer: Self-pay | Admitting: Family Medicine

## 2018-12-22 ENCOUNTER — Ambulatory Visit (INDEPENDENT_AMBULATORY_CARE_PROVIDER_SITE_OTHER): Payer: Medicare Other | Admitting: Family Medicine

## 2018-12-22 VITALS — BP 140/80 | HR 81 | Temp 96.8°F | Resp 19 | Ht 71.0 in | Wt 151.0 lb

## 2018-12-22 DIAGNOSIS — K8020 Calculus of gallbladder without cholecystitis without obstruction: Secondary | ICD-10-CM | POA: Diagnosis not present

## 2018-12-22 DIAGNOSIS — I252 Old myocardial infarction: Secondary | ICD-10-CM | POA: Diagnosis not present

## 2018-12-22 DIAGNOSIS — I10 Essential (primary) hypertension: Secondary | ICD-10-CM

## 2018-12-22 DIAGNOSIS — M519 Unspecified thoracic, thoracolumbar and lumbosacral intervertebral disc disorder: Secondary | ICD-10-CM

## 2018-12-22 DIAGNOSIS — Z23 Encounter for immunization: Secondary | ICD-10-CM

## 2018-12-22 MED ORDER — CYCLOBENZAPRINE HCL 10 MG PO TABS: 10.0000 mg | ORAL_TABLET | Freq: Two times a day (BID) | ORAL | 0 refills | Status: DC | PRN

## 2018-12-22 MED ORDER — NITROGLYCERIN 0.4 MG SL SUBL: 0.4000 mg | SUBLINGUAL_TABLET | SUBLINGUAL | 1 refills | Status: DC | PRN

## 2018-12-22 NOTE — Patient Instructions (Addendum)
Good to see you again today- I am glad that your back is improved Flu and pneumonia vaccines given today  I refilled your Flexeril which you can use as needed for back pain.  Remember this can make you sleepy, avoid using it when you need to drive.  Okay to use moderate doses of Tylenol and Advil or Aleve as needed Hopefully your back will continue to improve.  However, if your pain returns let me know and we can try steroids again.  If this fails, we can have you see a back surgeon I would recommend exercise, especially core strengthening activities to help protect your back.  You may find yoga or Pilates to be helpful.  You can look up a program of exercises online if you like  I will also refer you to a general surgeon to discuss having your gallbladder removed electively  We refilled your nitroglycerin to use if needed for chest pain  Please see me in 2-3 months for a physical and take care

## 2019-01-11 ENCOUNTER — Other Ambulatory Visit: Payer: Self-pay | Admitting: Family Medicine

## 2019-01-11 DIAGNOSIS — M519 Unspecified thoracic, thoracolumbar and lumbosacral intervertebral disc disorder: Secondary | ICD-10-CM

## 2019-03-14 NOTE — Progress Notes (Deleted)
[place] at [facility] [address] [place], [place] [address] [phone]  Date:  03/16/2019   Name:  Hunter Mcmillan   DOB:  Jan 17, 1951   MRN:  [id number]  PCP:  [name], MD    Chief Complaint: No chief complaint on file.   History of Present Illness:  Hunter Mcmillan is a 69 y.o. very pleasant male patient who presents with the following:  Here today for routine follow-up visit Last seen by myself in October for an ER follow-up  History of AAA, hypertension, CKD, substance abuse, STEMI, hepatitis C status post curative Harvoni treatment  At our last visit I referred him to general surgery to discuss elective cholecystectomy  Immunizations are up-to-date, can suggest Shingrix Needs lipids, PSA, TSH Patient Active Problem List   Diagnosis Date Noted  . Abdominal aortic aneurysm (AAA) 3.0 cm to 5.5 cm in diameter in male ([place]) 01/06/2017  . HTN (hypertension) 04/09/2015  . Hyperpigmentation of skin 10/05/2014  . COPD (chronic obstructive pulmonary disease) ([place]) 06/26/2014  . Sacroiliac joint dysfunction of right side 06/26/2014  . Alcoholism ([place])   . Cocaine abuse ([place]) 11/18/2013  . Scrotal mass 07/21/2013  . Cirrhosis ([place]) 05/28/2013  . Routine general medical examination at a health care facility 07/19/2012  . Abnormal TSH 04/09/2012  . Coronary atherosclerosis of native coronary artery 01/14/2012  . Hx of non-ST elevation myocardial infarction (NSTEMI) 01/05/2012  . Tobacco abuse 01/05/2012  . Chronic hepatitis C without hepatic coma ([place]) 01/05/2012  . Depression, recurrent ([place]) 01/05/2012  . Thrombocytopenia ([place]) 01/05/2012    Past Medical History:  Diagnosis Date  . Alcoholism ([place])   . CAD (coronary artery disease) 11/13   a. 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 Chicken pox   . Depression   . ETOH abuse   . Genital warts   . Heart attack ([place])   . Hepatic steatosis   . Hepatitis C    a. s/p [name] ~ 2003 in [place], [place] => stopped after 6 mos due to neutropenia - viral load noted to be low at that time;  no follow up since;   b. abdominal u/s 11/13:  diffuse hepatic steatosis and/or hepatocellular disease    . Hx of echocardiogram    a. Echo 01/06/12: Mild focal basal hypertrophy the septum, vigorous LV, EF 65-70%, Gr 2 diast dysfn, trivial AI, MAC, trivial MR, trivial TR, PASP 36  . Hyperlipidemia   . Hypertension   . Non-STEMI (non-ST elevated myocardial infarction) ([place])   . Stroke [facility])    "minor" pt reported  . Thrombocytopenia ([place])   . Tobacco abuse     Past Surgical History:  Procedure Laterality Date  . EYE SURGERY  1954   corrective surgery for crossed eyes  . EYE SURGERY  1959  . LEFT HEART CATHETERIZATION WITH CORONARY ANGIOGRAM N/A 01/05/2012   Procedure: LEFT HEART CATHETERIZATION WITH CORONARY ANGIOGRAM;  Surgeon: Larey Dresser, MD;  Location: [facility] CATH LAB;  Service: Cardiovascular;  Laterality: N/A;    Social History   Tobacco Use  . Smoking status: Light Tobacco Smoker    Packs/day: 0.10    Types: Cigarettes  . Smokeless tobacco: Never Used  . Tobacco comment:  almost quit, very rare, 1-2 daily  Substance Use Topics  . Alcohol use: Not Currently    Alcohol/week: 0.0 standard drinks    Comment: Last drink: 10 days ago   . Drug use: Yes    Types: Cocaine    Comment: history of cocaine last use 2014    Family History  Problem Relation Age of Onset  . Heart attack Mother 62  . Diabetes Mother   . Heart disease Mother   . Hyperlipidemia Father   . Hypertension Father   . Diabetes Maternal Grandfather   . Heart disease Maternal Grandfather   . Early death Maternal Grandfather   . Heart attack Maternal Grandfather   .  Hearing loss Sister   . Hyperlipidemia Sister   . Depression Brother   . Diabetes Brother   . Hyperlipidemia Brother   . Early death Maternal Grandmother   . Heart disease Maternal Grandmother   . Heart attack Maternal Grandmother   . Hearing loss Sister     No Known Allergies  Medication list has been reviewed and updated.  Current Outpatient Medications on File Prior to Visit  Medication Sig Dispense Refill  . cyclobenzaprine (FLEXERIL) 10 MG tablet TAKE 1 TABLET BY MOUTH TWICE DAILY AS NEEDED FOR MUSCLE SPASM 30 tablet 3  . diphenhydrAMINE (BENADRYL) 25 MG tablet Take 25 mg by mouth every 6 (six) hours as needed for allergies.    Marland Kitchen ibuprofen (ADVIL) 200 MG tablet Take 600-800 mg by mouth every 6 (six) hours as needed (For pain.).    Marland Kitchen Multiple Vitamin (MULTIVITAMIN WITH MINERALS) TABS tablet Take 1 tablet by mouth daily.    . nitroGLYCERIN (NITROSTAT) 0.4 MG SL tablet Place 1 tablet (0.4 mg total) under the tongue every 5 (five) minutes as needed for chest pain. 10 tablet 1  . ondansetron (ZOFRAN) 4 MG tablet Take 1 tablet (4 mg total) by mouth every 8 (eight) hours as needed for nausea or vomiting. (Patient not taking: Reported on 12/22/2018) 12 tablet 0   No current facility-administered medications on file prior to visit.    Review of Systems:  As per HPI- otherwise negative.   Physical Examination: There were no vitals filed for this visit. There were no vitals filed for this visit. There is no height or weight on file to calculate BMI. Ideal Body Weight:    GEN: WDWN, NAD, Non-toxic, A & O x 3 HEENT: Atraumatic, Normocephalic. Neck supple. No masses, No LAD. Ears and Nose: No external deformity. CV: RRR, No M/G/R. No JVD. No thrill. No extra heart sounds. PULM: CTA B, no wheezes, crackles, rhonchi. No retractions. No resp. distress. No accessory muscle use. ABD: S, NT, ND, +BS. No rebound. No HSM. EXTR: No c/c/e NEURO Normal gait.  PSYCH: Normally interactive.  Conversant. Not depressed or anxious appearing.  Calm demeanor.    Assessment and Plan: *** This visit occurred during the SARS-CoV-2 public health emergency.  Safety protocols were in place, including screening questions prior to the visit, additional usage of staff PPE, and extensive cleaning of exam room while observing appropriate contact time as indicated for disinfecting solutions.   Signed Lamar Blinks, MD

## 2019-03-16 ENCOUNTER — Other Ambulatory Visit: Payer: Self-pay

## 2019-03-16 ENCOUNTER — Encounter: Payer: Medicare Other | Admitting: Family Medicine

## 2019-04-05 NOTE — Progress Notes (Deleted)
[facility] [address], [place], [place] [address] [phone]  Date:  04/07/2019   Name:  Hunter Mcmillan   DOB:  20-Apr-1950   MRN:  [id number]  PCP:  [name], MD    Chief Complaint: No chief complaint on file.   History of Present Illness:  Hunter Mcmillan is a 68 y.o. very pleasant male patient who presents with the following:  Here today for a follow-up visit History of AAA, hypertension, COPD, NSTEMI, chronic hep C status post curative Harvoni treatment/cirrhosis Last seen by myself in October following back pain with bulging lumbar disc I also referred him to general surgery to discuss elective cholecystectomy at that time  Patient Active Problem List   Diagnosis Date Noted  . Abdominal aortic aneurysm (AAA) 3.0 cm to 5.5 cm in diameter in male ([place]) 01/06/2017  . HTN (hypertension) 04/09/2015  . Hyperpigmentation of skin 10/05/2014  . COPD (chronic obstructive pulmonary disease) ([place]) 06/26/2014  . Sacroiliac joint dysfunction of right side 06/26/2014  . Alcoholism ([place])   . Cocaine abuse ([place]) 11/18/2013  . Scrotal mass 07/21/2013  . Cirrhosis ([place]) 05/28/2013  . Routine general medical examination at a health care facility 07/19/2012  . Abnormal TSH 04/09/2012  . Coronary atherosclerosis of native coronary artery 01/14/2012  . Hx of non-ST elevation myocardial infarction (NSTEMI) 01/05/2012  . Tobacco abuse 01/05/2012  . Chronic hepatitis C without hepatic coma ([place]) 01/05/2012  . Depression, recurrent ([place]) 01/05/2012  . Thrombocytopenia ([place]) 01/05/2012    Past Medical History:  Diagnosis Date  . Alcoholism ([place])   . CAD (coronary artery disease) 11/13   a. 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 Chicken pox   . Depression   . ETOH abuse   . Genital warts   . Heart attack ([place])   . Hepatic steatosis   . Hepatitis C    a. s/p [name] ~ 2003 in [place], [place] => stopped after 6 mos due to neutropenia - viral load noted to be low at that time;  no follow up since;   b. abdominal u/s 11/13:  diffuse hepatic steatosis and/or hepatocellular disease    . Hx of echocardiogram    a. Echo 01/06/12: Mild focal basal hypertrophy the septum, vigorous LV, EF 65-70%, Gr 2 diast dysfn, trivial AI, MAC, trivial MR, trivial TR, PASP 36  . Hyperlipidemia   . Hypertension   . Non-STEMI (non-ST elevated myocardial infarction) ([place])   . Stroke [facility])    "minor" pt reported  . Thrombocytopenia ([place])   . Tobacco abuse     Past Surgical History:  Procedure Laterality Date  . EYE SURGERY  1954   corrective surgery for crossed eyes  . EYE SURGERY  1959  . LEFT HEART CATHETERIZATION WITH CORONARY ANGIOGRAM N/A 01/05/2012   Procedure: LEFT HEART CATHETERIZATION WITH CORONARY ANGIOGRAM;  Surgeon: Larey Dresser, MD;  Location: [facility] CATH LAB;  Service: Cardiovascular;  Laterality: N/A;    Social History   Tobacco Use  . Smoking status: Light Tobacco Smoker    Packs/day: 0.10    Types: Cigarettes  . Smokeless tobacco: Never Used  . Tobacco comment: almost quit, very rare, 1-2 daily  Substance Use Topics  .  Alcohol use: Not Currently    Alcohol/week: 0.0 standard drinks    Comment: Last drink: 10 days ago   . Drug use: Yes    Types: Cocaine    Comment: history of cocaine last use 2014    Family History  Problem Relation Age of Onset  . Heart attack Mother 33  . Diabetes Mother   . Heart disease Mother   . Hyperlipidemia Father   . Hypertension Father   . Diabetes Maternal Grandfather   . Heart disease Maternal Grandfather   . Early death Maternal Grandfather   . Heart attack Maternal Grandfather   . Hearing loss Sister   . Hyperlipidemia Sister   . Depression Brother    . Diabetes Brother   . Hyperlipidemia Brother   . Early death Maternal Grandmother   . Heart disease Maternal Grandmother   . Heart attack Maternal Grandmother   . Hearing loss Sister     No Known Allergies  Medication list has been reviewed and updated.  Current Outpatient Medications on File Prior to Visit  Medication Sig Dispense Refill  . cyclobenzaprine (FLEXERIL) 10 MG tablet TAKE 1 TABLET BY MOUTH TWICE DAILY AS NEEDED FOR MUSCLE SPASM 30 tablet 3  . diphenhydrAMINE (BENADRYL) 25 MG tablet Take 25 mg by mouth every 6 (six) hours as needed for allergies.    Marland Kitchen ibuprofen (ADVIL) 200 MG tablet Take 600-800 mg by mouth every 6 (six) hours as needed (For pain.).    Marland Kitchen Multiple Vitamin (MULTIVITAMIN WITH MINERALS) TABS tablet Take 1 tablet by mouth daily.    . nitroGLYCERIN (NITROSTAT) 0.4 MG SL tablet Place 1 tablet (0.4 mg total) under the tongue every 5 (five) minutes as needed for chest pain. 10 tablet 1  . ondansetron (ZOFRAN) 4 MG tablet Take 1 tablet (4 mg total) by mouth every 8 (eight) hours as needed for nausea or vomiting. (Patient not taking: Reported on 12/22/2018) 12 tablet 0   No current facility-administered medications on file prior to visit.    Review of Systems:  As per HPI- otherwise negative.   Physical Examination: There were no vitals filed for this visit. There were no vitals filed for this visit. There is no height or weight on file to calculate BMI. Ideal Body Weight:    GEN: WDWN, NAD, Non-toxic, A & O x 3 HEENT: Atraumatic, Normocephalic. Neck supple. No masses, No LAD. Ears and Nose: No external deformity. CV: RRR, No M/G/R. No JVD. No thrill. No extra heart sounds. PULM: CTA B, no wheezes, crackles, rhonchi. No retractions. No resp. distress. No accessory muscle use. ABD: S, NT, ND, +BS. No rebound. No HSM. EXTR: No c/c/e NEURO Normal gait.  PSYCH: Normally interactive. Conversant. Not depressed or anxious appearing.  Calm demeanor.     Assessment and Plan: ***  Signed Lamar Blinks, MD

## 2019-04-07 ENCOUNTER — Encounter: Payer: Medicare Other | Admitting: Family Medicine

## 2019-04-24 ENCOUNTER — Other Ambulatory Visit: Payer: Self-pay

## 2019-04-24 ENCOUNTER — Encounter (HOSPITAL_COMMUNITY)
Admission: EM | Disposition: A | Discharge status: Home / Self Care | Payer: Self-pay | Source: Home / Self Care | Attending: Cardiology

## 2019-04-24 ENCOUNTER — Inpatient Hospital Stay (HOSPITAL_COMMUNITY): Payer: Medicare Other

## 2019-04-24 ENCOUNTER — Inpatient Hospital Stay (HOSPITAL_COMMUNITY)
Admission: EM | Admit: 2019-04-24 | Discharge: 2019-04-26 | DRG: 247 | Disposition: A | Discharge status: Home / Self Care | Payer: Medicare Other | Attending: Cardiology | Admitting: Cardiology

## 2019-04-24 ENCOUNTER — Emergency Department (HOSPITAL_COMMUNITY): Payer: Medicare Other

## 2019-04-24 ENCOUNTER — Inpatient Hospital Stay (HOSPITAL_COMMUNITY)
Admission: EM | Disposition: A | Discharge status: Home / Self Care | Payer: Self-pay | Source: Home / Self Care | Attending: Cardiology

## 2019-04-24 DIAGNOSIS — I25110 Atherosclerotic heart disease of native coronary artery with unstable angina pectoris: Secondary | ICD-10-CM | POA: Diagnosis present

## 2019-04-24 DIAGNOSIS — I472 Ventricular tachycardia: Secondary | ICD-10-CM | POA: Diagnosis not present

## 2019-04-24 DIAGNOSIS — I2119 ST elevation (STEMI) myocardial infarction involving other coronary artery of inferior wall: Secondary | ICD-10-CM | POA: Diagnosis not present

## 2019-04-24 DIAGNOSIS — Z955 Presence of coronary angioplasty implant and graft: Secondary | ICD-10-CM | POA: Diagnosis not present

## 2019-04-24 DIAGNOSIS — F17210 Nicotine dependence, cigarettes, uncomplicated: Secondary | ICD-10-CM | POA: Diagnosis present

## 2019-04-24 DIAGNOSIS — E7800 Pure hypercholesterolemia, unspecified: Secondary | ICD-10-CM

## 2019-04-24 DIAGNOSIS — Z720 Tobacco use: Secondary | ICD-10-CM | POA: Diagnosis present

## 2019-04-24 DIAGNOSIS — I361 Nonrheumatic tricuspid (valve) insufficiency: Secondary | ICD-10-CM

## 2019-04-24 DIAGNOSIS — R079 Chest pain, unspecified: Secondary | ICD-10-CM

## 2019-04-24 DIAGNOSIS — E785 Hyperlipidemia, unspecified: Secondary | ICD-10-CM | POA: Diagnosis present

## 2019-04-24 DIAGNOSIS — Z20822 Contact with and (suspected) exposure to covid-19: Secondary | ICD-10-CM | POA: Diagnosis not present

## 2019-04-24 DIAGNOSIS — J431 Panlobular emphysema: Secondary | ICD-10-CM | POA: Diagnosis not present

## 2019-04-24 DIAGNOSIS — K760 Fatty (change of) liver, not elsewhere classified: Secondary | ICD-10-CM | POA: Diagnosis present

## 2019-04-24 DIAGNOSIS — I213 ST elevation (STEMI) myocardial infarction of unspecified site: Secondary | ICD-10-CM

## 2019-04-24 DIAGNOSIS — I10 Essential (primary) hypertension: Secondary | ICD-10-CM | POA: Diagnosis present

## 2019-04-24 DIAGNOSIS — J449 Chronic obstructive pulmonary disease, unspecified: Secondary | ICD-10-CM | POA: Diagnosis not present

## 2019-04-24 DIAGNOSIS — I2510 Atherosclerotic heart disease of native coronary artery without angina pectoris: Secondary | ICD-10-CM | POA: Diagnosis present

## 2019-04-24 DIAGNOSIS — I319 Disease of pericardium, unspecified: Secondary | ICD-10-CM | POA: Diagnosis present

## 2019-04-24 DIAGNOSIS — Z743 Need for continuous supervision: Secondary | ICD-10-CM | POA: Diagnosis not present

## 2019-04-24 DIAGNOSIS — I2111 ST elevation (STEMI) myocardial infarction involving right coronary artery: Secondary | ICD-10-CM | POA: Diagnosis not present

## 2019-04-24 DIAGNOSIS — I252 Old myocardial infarction: Secondary | ICD-10-CM | POA: Diagnosis not present

## 2019-04-24 DIAGNOSIS — Z8673 Personal history of transient ischemic attack (TIA), and cerebral infarction without residual deficits: Secondary | ICD-10-CM | POA: Diagnosis not present

## 2019-04-24 DIAGNOSIS — I219 Acute myocardial infarction, unspecified: Secondary | ICD-10-CM | POA: Diagnosis not present

## 2019-04-24 DIAGNOSIS — I255 Ischemic cardiomyopathy: Secondary | ICD-10-CM | POA: Diagnosis present

## 2019-04-24 DIAGNOSIS — R0789 Other chest pain: Secondary | ICD-10-CM | POA: Diagnosis not present

## 2019-04-24 DIAGNOSIS — R609 Edema, unspecified: Secondary | ICD-10-CM | POA: Diagnosis not present

## 2019-04-24 DIAGNOSIS — Z79899 Other long term (current) drug therapy: Secondary | ICD-10-CM

## 2019-04-24 HISTORY — PX: CORONARY ANGIOGRAPHY: CATH118303

## 2019-04-24 HISTORY — PX: LEFT HEART CATH AND CORONARY ANGIOGRAPHY: CATH118249

## 2019-04-24 HISTORY — PX: CORONARY/GRAFT ACUTE MI REVASCULARIZATION: CATH118305

## 2019-04-24 LAB — COMPREHENSIVE METABOLIC PANEL
ALT: 124 U/L — ABNORMAL HIGH (ref 0–44)
AST: 171 U/L — ABNORMAL HIGH (ref 15–41)
Albumin: 3.7 g/dL (ref 3.5–5.0)
Alkaline Phosphatase: 87 U/L (ref 38–126)
Anion gap: 12 (ref 5–15)
BUN: 21 mg/dL (ref 8–23)
CO2: 25 mmol/L (ref 22–32)
Calcium: 9.2 mg/dL (ref 8.9–10.3)
Chloride: 96 mmol/L — ABNORMAL LOW (ref 98–111)
Creatinine, Ser: 1.36 mg/dL — ABNORMAL HIGH (ref 0.61–1.24)
GFR calc Af Amer: >60 mL/min (ref >60)
GFR calc non Af Amer: 53 mL/min — ABNORMAL LOW (ref >60)
Glucose, Bld: 214 mg/dL — ABNORMAL HIGH (ref 70–99)
Potassium: 3.9 mmol/L (ref 3.5–5.1)
Sodium: 133 mmol/L — ABNORMAL LOW (ref 135–145)
Total Bilirubin: 1.5 mg/dL — ABNORMAL HIGH (ref 0.3–1.2)
Total Protein: 7.1 g/dL (ref 6.5–8.1)

## 2019-04-24 LAB — ECHOCARDIOGRAM COMPLETE
Height: 71 in
Weight: 2400 oz

## 2019-04-24 LAB — CBC WITH DIFFERENTIAL/PLATELET
Abs Immature Granulocytes: 0.04 10*3/uL (ref 0.00–0.07)
Basophils Absolute: 0 10*3/uL (ref 0.0–0.1)
Basophils Relative: 0 %
Eosinophils Absolute: 0 10*3/uL (ref 0.0–0.5)
Eosinophils Relative: 0 %
HCT: 40.3 % (ref 39.0–52.0)
Hemoglobin: 13.3 g/dL (ref 13.0–17.0)
Immature Granulocytes: 0 %
Lymphocytes Relative: 11 %
Lymphs Abs: 1.1 10*3/uL (ref 0.7–4.0)
MCH: 33.1 pg (ref 26.0–34.0)
MCHC: 33 g/dL (ref 30.0–36.0)
MCV: 100.2 fL — ABNORMAL HIGH (ref 80.0–100.0)
Monocytes Absolute: 0.9 10*3/uL (ref 0.1–1.0)
Monocytes Relative: 9 %
Neutro Abs: 8.2 10*3/uL — ABNORMAL HIGH (ref 1.7–7.7)
Neutrophils Relative %: 80 %
Platelets: 112 10*3/uL — ABNORMAL LOW (ref 150–400)
RBC: 4.02 MIL/uL — ABNORMAL LOW (ref 4.22–5.81)
RDW: 12.4 % (ref 11.5–15.5)
WBC: 10.2 10*3/uL (ref 4.0–10.5)
nRBC: 0 % (ref 0.0–0.2)

## 2019-04-24 LAB — MRSA PCR SCREENING: MRSA by PCR: NEGATIVE

## 2019-04-24 LAB — HEMOGLOBIN A1C
Hgb A1c MFr Bld: 5.8 % — ABNORMAL HIGH (ref 4.8–5.6)
Mean Plasma Glucose: 119.76 mg/dL

## 2019-04-24 LAB — APTT: aPTT: 23 seconds — ABNORMAL LOW (ref 24–36)

## 2019-04-24 LAB — LIPID PANEL
Cholesterol: 126 mg/dL (ref 0–200)
HDL: 25 mg/dL — ABNORMAL LOW (ref >40)
LDL Cholesterol: 90 mg/dL (ref 0–99)
Total CHOL/HDL Ratio: 5 RATIO
Triglycerides: 56 mg/dL (ref <150)
VLDL: 11 mg/dL (ref 0–40)

## 2019-04-24 LAB — RESPIRATORY PANEL BY RT PCR (FLU A&B, COVID)
Influenza A by PCR: NEGATIVE
Influenza B by PCR: NEGATIVE
SARS Coronavirus 2 by RT PCR: NEGATIVE

## 2019-04-24 LAB — TROPONIN I (HIGH SENSITIVITY)
Troponin I (High Sensitivity): 11516 ng/L (ref <18)
Troponin I (High Sensitivity): 18332 ng/L (ref <18)

## 2019-04-24 LAB — PROTIME-INR
INR: 1.2 (ref 0.8–1.2)
Prothrombin Time: 15.1 seconds (ref 11.4–15.2)

## 2019-04-24 LAB — POCT ACTIVATED CLOTTING TIME
Activated Clotting Time: 0 seconds
Activated Clotting Time: 147 seconds

## 2019-04-24 SURGERY — CORONARY ANGIOGRAPHY (CATH LAB)
Anesthesia: LOCAL

## 2019-04-24 SURGERY — CORONARY/GRAFT ACUTE MI REVASCULARIZATION
Anesthesia: LOCAL

## 2019-04-24 MED ORDER — SODIUM CHLORIDE 0.9 % IV SOLN: 250.0000 mL | INTRAVENOUS | Status: DC | PRN

## 2019-04-24 MED ORDER — TICAGRELOR 90 MG PO TABS
90.0000 mg | ORAL_TABLET | Freq: Two times a day (BID) | ORAL | Status: DC
  Administered 2019-04-24 – 2019-04-26 (×4): 90 mg via ORAL
  Filled 2019-04-24 (×4): qty 1

## 2019-04-24 MED ORDER — ATROPINE SULFATE 1 MG/10ML IJ SOSY
PREFILLED_SYRINGE | INTRAMUSCULAR | Status: AC
  Filled 2019-04-24: qty 10

## 2019-04-24 MED ORDER — HEPARIN (PORCINE) IN NACL 1000-0.9 UT/500ML-% IV SOLN
INTRAVENOUS | Status: AC
  Filled 2019-04-24: qty 1000

## 2019-04-24 MED ORDER — TIROFIBAN HCL IN NACL 5-0.9 MG/100ML-% IV SOLN
INTRAVENOUS | Status: AC | PRN
  Administered 2019-04-24: 0.075 ug/kg/min via INTRAVENOUS

## 2019-04-24 MED ORDER — IOHEXOL 350 MG/ML SOLN
INTRAVENOUS | Status: DC | PRN
  Administered 2019-04-24: 110 mL via INTRA_ARTERIAL

## 2019-04-24 MED ORDER — HEPARIN (PORCINE) IN NACL 1000-0.9 UT/500ML-% IV SOLN
INTRAVENOUS | Status: DC | PRN
  Administered 2019-04-24: 500 mL

## 2019-04-24 MED ORDER — VERAPAMIL HCL 2.5 MG/ML IV SOLN
INTRAVENOUS | Status: AC
  Filled 2019-04-24: qty 2

## 2019-04-24 MED ORDER — IOHEXOL 350 MG/ML SOLN
INTRAVENOUS | Status: DC | PRN
  Administered 2019-04-24: 10 mL

## 2019-04-24 MED ORDER — METOPROLOL SUCCINATE ER 25 MG PO TB24
25.0000 mg | ORAL_TABLET | Freq: Every day | ORAL | Status: DC
  Administered 2019-04-24 – 2019-04-26 (×3): 25 mg via ORAL
  Filled 2019-04-24 (×3): qty 1

## 2019-04-24 MED ORDER — ENOXAPARIN SODIUM 40 MG/0.4ML ~~LOC~~ SOLN: 40.0000 mg | SUBCUTANEOUS | Status: DC

## 2019-04-24 MED ORDER — SODIUM CHLORIDE 0.9% FLUSH: 3.0000 mL | INTRAVENOUS | Status: DC | PRN

## 2019-04-24 MED ORDER — CHLORHEXIDINE GLUCONATE CLOTH 2 % EX PADS
6.0000 | MEDICATED_PAD | Freq: Every day | CUTANEOUS | Status: DC
  Administered 2019-04-24 – 2019-04-25 (×2): 6 via TOPICAL

## 2019-04-24 MED ORDER — SODIUM CHLORIDE 0.9 % WEIGHT BASED INFUSION
1.0000 mL/kg/h | INTRAVENOUS | Status: DC
  Administered 2019-04-24: 14:00:00 1 mL/kg/h via INTRAVENOUS

## 2019-04-24 MED ORDER — HEPARIN SODIUM (PORCINE) 1000 UNIT/ML IJ SOLN
INTRAMUSCULAR | Status: DC | PRN
  Administered 2019-04-24: 3000 [IU] via INTRAVENOUS
  Administered 2019-04-24: 5000 [IU] via INTRAVENOUS

## 2019-04-24 MED ORDER — ASPIRIN 81 MG PO CHEW: 81.0000 mg | CHEWABLE_TABLET | Freq: Every day | ORAL | Status: DC

## 2019-04-24 MED ORDER — SODIUM CHLORIDE 0.9% FLUSH: 3.0000 mL | Freq: Two times a day (BID) | INTRAVENOUS | Status: DC

## 2019-04-24 MED ORDER — SODIUM CHLORIDE 0.9 % IV BOLUS
1000.0000 mL | Freq: Once | INTRAVENOUS | Status: AC
  Administered 2019-04-24: 08:00:00 1000 mL via INTRAVENOUS

## 2019-04-24 MED ORDER — HEPARIN SODIUM (PORCINE) 1000 UNIT/ML IJ SOLN
INTRAMUSCULAR | Status: AC
  Filled 2019-04-24: qty 1

## 2019-04-24 MED ORDER — VERAPAMIL HCL 2.5 MG/ML IV SOLN
INTRAVENOUS | Status: DC | PRN
  Administered 2019-04-24: 10 mL via INTRA_ARTERIAL

## 2019-04-24 MED ORDER — MORPHINE SULFATE (PF) 2 MG/ML IV SOLN
2.0000 mg | INTRAVENOUS | Status: DC | PRN
  Administered 2019-04-24: 2 mg via INTRAVENOUS
  Filled 2019-04-24: qty 1

## 2019-04-24 MED ORDER — MORPHINE SULFATE (PF) 2 MG/ML IV SOLN
INTRAVENOUS | Status: AC
  Administered 2019-04-24: 2 mg via INTRAVENOUS
  Filled 2019-04-24: qty 1

## 2019-04-24 MED ORDER — ATORVASTATIN CALCIUM 80 MG PO TABS
80.0000 mg | ORAL_TABLET | Freq: Every day | ORAL | Status: DC
  Administered 2019-04-24 – 2019-04-25 (×2): 80 mg via ORAL
  Filled 2019-04-24 (×2): qty 1

## 2019-04-24 MED ORDER — LIDOCAINE HCL (PF) 1 % IJ SOLN
INTRAMUSCULAR | Status: AC
  Filled 2019-04-24: qty 30

## 2019-04-24 MED ORDER — FENTANYL CITRATE (PF) 100 MCG/2ML IJ SOLN
INTRAMUSCULAR | Status: AC
  Filled 2019-04-24: qty 2

## 2019-04-24 MED ORDER — NITROGLYCERIN 0.4 MG SL SUBL
0.4000 mg | SUBLINGUAL_TABLET | SUBLINGUAL | Status: DC | PRN
  Administered 2019-04-24 (×3): 0.4 mg via SUBLINGUAL
  Filled 2019-04-24: qty 1

## 2019-04-24 MED ORDER — HEPARIN SODIUM (PORCINE) 5000 UNIT/ML IJ SOLN
INTRAMUSCULAR | Status: AC
  Administered 2019-04-24: 4000 [IU] via INTRAVENOUS
  Filled 2019-04-24: qty 1

## 2019-04-24 MED ORDER — TIROFIBAN HCL IN NACL 5-0.9 MG/100ML-% IV SOLN
INTRAVENOUS | Status: AC
  Filled 2019-04-24: qty 100

## 2019-04-24 MED ORDER — TICAGRELOR 90 MG PO TABS
ORAL_TABLET | ORAL | Status: DC | PRN
  Administered 2019-04-24: 180 mg via ORAL

## 2019-04-24 MED ORDER — TICAGRELOR 90 MG PO TABS
ORAL_TABLET | ORAL | Status: AC
  Filled 2019-04-24: qty 1

## 2019-04-24 MED ORDER — TIROFIBAN (AGGRASTAT) BOLUS VIA INFUSION
INTRAVENOUS | Status: DC | PRN
  Administered 2019-04-24: 1700 ug via INTRAVENOUS

## 2019-04-24 MED ORDER — NITROGLYCERIN 1 MG/10 ML FOR IR/CATH LAB
INTRA_ARTERIAL | Status: AC
  Filled 2019-04-24: qty 10

## 2019-04-24 MED ORDER — FENTANYL CITRATE (PF) 100 MCG/2ML IJ SOLN
50.0000 ug | Freq: Once | INTRAMUSCULAR | Status: AC
  Administered 2019-04-24: 08:00:00 50 ug via INTRAVENOUS

## 2019-04-24 MED ORDER — LIDOCAINE HCL (PF) 1 % IJ SOLN
INTRAMUSCULAR | Status: DC | PRN
  Administered 2019-04-24: 5 mL via SUBCUTANEOUS

## 2019-04-24 MED ORDER — ADULT MULTIVITAMIN W/MINERALS CH
1.0000 | ORAL_TABLET | Freq: Every day | ORAL | Status: DC
  Administered 2019-04-25 – 2019-04-26 (×2): 1 via ORAL
  Filled 2019-04-24 (×2): qty 1

## 2019-04-24 MED ORDER — AMIODARONE LOAD VIA INFUSION
INTRAVENOUS | Status: DC | PRN
  Administered 2019-04-24: 150 mg via INTRAVENOUS

## 2019-04-24 MED ORDER — AMIODARONE HCL 150 MG/3ML IV SOLN
INTRAVENOUS | Status: AC
  Filled 2019-04-24: qty 3

## 2019-04-24 MED ORDER — SODIUM CHLORIDE 0.9% FLUSH
3.0000 mL | Freq: Two times a day (BID) | INTRAVENOUS | Status: DC
  Administered 2019-04-24 – 2019-04-26 (×3): 3 mL via INTRAVENOUS

## 2019-04-24 MED ORDER — ONDANSETRON HCL 4 MG/2ML IJ SOLN: 4.0000 mg | Freq: Four times a day (QID) | INTRAMUSCULAR | Status: DC | PRN

## 2019-04-24 MED ORDER — ACETAMINOPHEN 325 MG PO TABS
650.0000 mg | ORAL_TABLET | ORAL | Status: DC | PRN
  Administered 2019-04-24: 650 mg via ORAL
  Filled 2019-04-24: qty 2

## 2019-04-24 MED ORDER — NITROGLYCERIN 1 MG/10 ML FOR IR/CATH LAB
INTRA_ARTERIAL | Status: DC | PRN
  Administered 2019-04-24 (×2): 200 ug via INTRACORONARY

## 2019-04-24 MED ORDER — IOHEXOL 350 MG/ML SOLN
INTRAVENOUS | Status: AC
  Filled 2019-04-24: qty 1

## 2019-04-24 MED ORDER — LIDOCAINE HCL (PF) 1 % IJ SOLN
INTRAMUSCULAR | Status: DC | PRN
  Administered 2019-04-24: 13 mL

## 2019-04-24 MED ORDER — COLCHICINE 0.6 MG PO TABS
0.6000 mg | ORAL_TABLET | Freq: Every day | ORAL | Status: DC
  Administered 2019-04-24 – 2019-04-26 (×3): 0.6 mg via ORAL
  Filled 2019-04-24 (×3): qty 1

## 2019-04-24 MED ORDER — SODIUM CHLORIDE 0.9 % IV SOLN
INTRAVENOUS | Status: AC | PRN
  Administered 2019-04-24: 10 mL/h via INTRAVENOUS

## 2019-04-24 MED ORDER — TIROFIBAN HCL IV 12.5 MG/250 ML
0.0750 ug/kg/min | INTRAVENOUS | Status: AC
  Administered 2019-04-24 (×2): 0.075 ug/kg/min via INTRAVENOUS
  Filled 2019-04-24: qty 250

## 2019-04-24 MED ORDER — SODIUM CHLORIDE 0.9 % WEIGHT BASED INFUSION
1.0000 mL/kg/h | INTRAVENOUS | Status: AC
  Administered 2019-04-24 (×2): 1 mL/kg/h via INTRAVENOUS

## 2019-04-24 MED ORDER — ENOXAPARIN SODIUM 40 MG/0.4ML ~~LOC~~ SOLN
40.0000 mg | SUBCUTANEOUS | Status: DC
  Administered 2019-04-25 – 2019-04-26 (×2): 40 mg via SUBCUTANEOUS
  Filled 2019-04-24 (×2): qty 0.4

## 2019-04-24 MED ORDER — ASPIRIN EC 81 MG PO TBEC
81.0000 mg | DELAYED_RELEASE_TABLET | Freq: Every day | ORAL | Status: DC
  Administered 2019-04-25 – 2019-04-26 (×2): 81 mg via ORAL
  Filled 2019-04-24 (×2): qty 1

## 2019-04-24 MED ORDER — HEPARIN SODIUM (PORCINE) 5000 UNIT/ML IJ SOLN: 4000.0000 [IU] | Freq: Once | INTRAMUSCULAR | Status: AC

## 2019-04-24 MED ORDER — FENTANYL CITRATE (PF) 100 MCG/2ML IJ SOLN
50.0000 ug | Freq: Once | INTRAMUSCULAR | Status: AC
  Administered 2019-04-24: 50 ug via INTRAVENOUS
  Filled 2019-04-24: qty 2

## 2019-04-24 MED ORDER — SODIUM CHLORIDE 0.9 % IV SOLN: INTRAVENOUS | Status: DC

## 2019-04-24 MED ORDER — HEPARIN (PORCINE) IN NACL 1000-0.9 UT/500ML-% IV SOLN
INTRAVENOUS | Status: DC | PRN
  Administered 2019-04-24 (×2): 500 mL

## 2019-04-24 MED ORDER — ACETAMINOPHEN 325 MG PO TABS: 650.0000 mg | ORAL_TABLET | ORAL | Status: DC | PRN

## 2019-04-24 MED ORDER — CYCLOBENZAPRINE HCL 10 MG PO TABS: 10.0000 mg | ORAL_TABLET | Freq: Two times a day (BID) | ORAL | Status: DC | PRN

## 2019-04-24 SURGICAL SUPPLY — 20 items
BALLN SAPPHIRE 2.5X15 (BALLOONS) ×2
BALLN SAPPHIRE ~~LOC~~ 3.0X15 (BALLOONS) ×2 IMPLANT
BALLN SAPPHIRE ~~LOC~~ 4.0X15 (BALLOONS) ×2 IMPLANT
BALLOON SAPPHIRE 2.5X15 (BALLOONS) ×1 IMPLANT
CATH 5FR JL3.5 JR4 ANG PIG MP (CATHETERS) ×2 IMPLANT
CATH EXTRAC PRONTO 5.5F 138CM (CATHETERS) ×2 IMPLANT
CATH LAUNCHER 6FR AL.75 (CATHETERS) ×2 IMPLANT
DEVICE RAD COMP TR BAND LRG (VASCULAR PRODUCTS) ×2 IMPLANT
ELECT DEFIB PAD ADLT CADENCE (PAD) ×2 IMPLANT
GLIDESHEATH SLEND SS 6F .021 (SHEATH) ×2 IMPLANT
GUIDEWIRE INQWIRE 1.5J.035X260 (WIRE) ×1 IMPLANT
INQWIRE 1.5J .035X260CM (WIRE) ×2
KIT ENCORE 26 ADVANTAGE (KITS) ×2 IMPLANT
KIT HEART LEFT (KITS) ×2 IMPLANT
PACK CARDIAC CATHETERIZATION (CUSTOM PROCEDURE TRAY) ×2 IMPLANT
STENT RESOLUTE ONYX 3.5X30 (Permanent Stent) ×2 IMPLANT
STENT RESOLUTE ONYX 3.5X38 (Permanent Stent) ×2 IMPLANT
TRANSDUCER W/STOPCOCK (MISCELLANEOUS) ×2 IMPLANT
TUBING CIL FLEX 10 FLL-RA (TUBING) ×2 IMPLANT
WIRE ASAHI PROWATER 180CM (WIRE) ×2 IMPLANT

## 2019-04-24 SURGICAL SUPPLY — 9 items
CATH INFINITI JR4 5F (CATHETERS) ×2 IMPLANT
KIT ENCORE 26 ADVANTAGE (KITS) ×2 IMPLANT
KIT HEART LEFT (KITS) ×2 IMPLANT
PACK CARDIAC CATHETERIZATION (CUSTOM PROCEDURE TRAY) ×2 IMPLANT
SHEATH PINNACLE 6F 10CM (SHEATH) ×2 IMPLANT
SHEATH PROBE COVER 6X72 (BAG) ×2 IMPLANT
TRANSDUCER W/STOPCOCK (MISCELLANEOUS) ×2 IMPLANT
TUBING CIL FLEX 10 FLL-RA (TUBING) ×2 IMPLANT
WIRE EMERALD 3MM-J .035X150CM (WIRE) ×2 IMPLANT

## 2019-04-24 NOTE — Progress Notes (Signed)
  Echocardiogram 2D Echocardiogram has been performed.  Roosvelt Maser F 04/24/2019, 4:49 PM

## 2019-04-24 NOTE — Progress Notes (Signed)
   04/24/19 0800  Clinical Encounter Type  Visited With Patient;Health care provider  Visit Type Initial;ED;Code  Referral From Other (Comment) (Code STEMI)  Spiritual Encounters  Spiritual Needs Emotional  Stress Factors  Patient Stress Factors Health changes;Exhausted   Pt's roommate already knows he is here, doesn't desire any add'l persons called at this time.    He is very upbeat, tired of dealing with this problem and hopes that this time there will be a solution.    Pls pg if desire chaplain to return.  Pershing Proud [place], [phone]

## 2019-04-24 NOTE — H&P (Signed)
Cardiology Admission History and Physical:   Patient ID: Hunter Mcmillan MRN: [id number]; DOB: 1950/11/21   Admission date: 04/24/2019  Primary Care Provider: [name], MD Primary Cardiologist: No primary care provider on file. Dr. Aundra Dubin in 2013 Primary Electrophysiologist:  None   Chief Complaint:  Chest pain  Patient Profile:   Hunter Mcmillan is a 69 y.o. 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 Due to small vessel size this was treated medically. Now presents with acute severe chest pain and ST elevation inferiorly.  History of Present Illness:   [name] has a history of CAD. Presented in 2013 with NSTEMI. 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 Otherwise nonobstructive CAD. Due to small caliber of LCx this was treated medically. No cardiac follow up since then. Awoke at 4 am with severe SSCP and pressure. SOB. Eased some now after IV Fentanyl. Reports remote history of stroke, HLD, HTN, tobacco abuse. Was evaluated for gallbladder disease last fall and surgery recommended but he has not had this done yet. Also history of lumbar disc disease. Currently smoking 2 cigs/day.   Heart Pathway Score:     Past Medical History:  Diagnosis Date  . Alcoholism ([place])   . CAD (coronary artery disease) 11/13   a. 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 Chicken pox   . Depression   . ETOH abuse   . Genital warts   . Heart attack ([place])   . Hepatic steatosis   . Hepatitis C    a. s/p [name] ~ 2003 in [place], [place] => stopped after 6 mos due to neutropenia - viral load noted to be low at that time;  no follow up since;   b. abdominal u/s 11/13:   diffuse hepatic steatosis and/or hepatocellular disease    . Hx of echocardiogram    a. Echo 01/06/12: Mild focal basal hypertrophy the septum, vigorous LV, EF 65-70%, Gr 2 diast dysfn, trivial AI, MAC, trivial MR, trivial TR, PASP 36  . Hyperlipidemia   . Hypertension   . Non-STEMI (non-ST elevated myocardial infarction) ([place])   . Stroke [facility])    "minor" pt reported  . Thrombocytopenia ([place])   . Tobacco abuse     Past Surgical History:  Procedure Laterality Date  . EYE SURGERY  1954   corrective surgery for crossed eyes  . EYE SURGERY  1959  . LEFT HEART CATHETERIZATION WITH CORONARY ANGIOGRAM N/A 01/05/2012   Procedure: LEFT HEART CATHETERIZATION WITH CORONARY ANGIOGRAM;  Surgeon: Larey Dresser, MD;  Location: [facility] CATH LAB;  Service: Cardiovascular;  Laterality: N/A;     Medications Prior to Admission: Prior to Admission medications   Medication Sig Start Date End Date Taking? Authorizing Provider  cyclobenzaprine (FLEXERIL) 10 MG tablet TAKE 1 TABLET BY MOUTH TWICE DAILY AS NEEDED FOR MUSCLE SPASM 01/12/19   Copland, Gay Filler, MD  diphenhydrAMINE (BENADRYL) 25 MG tablet Take 25 mg by mouth every 6 (six) hours as needed for allergies.    [provider]  ibuprofen (ADVIL) 200 MG tablet Take 600-800 mg by mouth every 6 (six) hours as needed (For pain.).    [provider]  Multiple Vitamin (MULTIVITAMIN WITH MINERALS) TABS tablet Take 1 tablet by mouth daily.    [provider]  nitroGLYCERIN (NITROSTAT) 0.4 MG SL tablet Place 1 tablet (0.4 mg total) under the tongue every 5 (five) minutes as needed for chest pain. 12/22/18   Copland, Gay Filler, MD  ondansetron (ZOFRAN) 4 MG tablet Take 1 tablet (4 mg total) by mouth every 8 (eight) hours as needed for nausea or vomiting. Patient not taking: Reported on 12/22/2018 10/12/18   Caccavale, Sophia, PA-C     Allergies:   No Known Allergies  Social History:   Social History   Socioeconomic History  .  Marital status: Single    Spouse name: Not on file  . Number of children: 1  . Years of education: Not on file  . Highest education level: Bachelor's degree (e.g., BA, AB, BS)  Occupational History  . Occupation: Music therapist: [employer]    Comment: retired  Tobacco Use  . Smoking status: Light Tobacco Smoker    Packs/day: 0.10    Types: Cigarettes  . Smokeless tobacco: Never Used  . Tobacco comment: almost quit, very rare, 1-2 daily  Substance and Sexual Activity  . Alcohol use: Not Currently    Alcohol/week: 0.0 standard drinks    Comment: Last drink: 10 days ago   . Drug use: Yes    Types: Cocaine    Comment: history of cocaine last use 2014  . Sexual activity: Yes    Partners: Female    Comment: pt. declined condoms  Other Topics Concern  . Not on file  Social History Narrative  . Not on file   Social Determinants of Health   Financial Resource Strain:   . Difficulty of Paying Living Expenses: Not on file  Food Insecurity:   . Worried About Charity fundraiser in the Last Year: Not on file  . Ran Out of Food in the Last Year: Not on file  Transportation Needs:   . Lack of Transportation (Medical): Not on file  . Lack of Transportation (Non-Medical): Not on file  Physical Activity:   . Days of Exercise per Week: Not on file  . Minutes of Exercise per Session: Not on file  Stress:   . Feeling of Stress : Not on file  Social Connections:   . Frequency of Communication with Friends and Family: Not on file  . Frequency of Social Gatherings with Friends and Family: Not on file  . Attends Religious Services: Not on file  . Active Member of Clubs or Organizations: Not on file  . Attends Archivist Meetings: Not on file  . Marital Status: Not on file  Intimate Partner Violence:   . Fear of Current or Ex-Partner: Not on file  . Emotionally Abused: Not on file  . Physically Abused: Not on file  . Sexually Abused: Not on file    Family  History:   The patient's family history includes Depression in his brother; Diabetes in his brother, maternal grandfather, and mother; Early death in his maternal grandfather and maternal grandmother; Hearing loss in his sister and sister; Heart attack in his maternal grandfather and maternal grandmother; Heart attack (age of onset: 21) in his mother; Heart disease in his maternal grandfather, maternal grandmother, and mother; Hyperlipidemia in his brother, father, and sister; Hypertension in his father.  Both parents died in their 101s. Sister with history of CAD and CABG.  ROS:  Please see the history  of present illness.  All other ROS reviewed and negative.     Physical Exam/Data:   Vitals:   04/24/19 0800 04/24/19 0806 04/24/19 0816 04/24/19 0830  BP: 125/70  131/71 (!) 144/78  Pulse: (!) 36  (!) 36   Resp: (!) 45  (!) 26 (!) 21  Temp:      TempSrc:      SpO2: 99%  98%   Weight:  68 kg    Height:  _0  (1.803 m)     No intake or output data in the 24 hours ending 04/24/19 0837 Last 3 Weights 04/24/2019 12/22/2018 12/13/2018  Weight (lbs) 150 lb 151 lb 160 lb  Weight (kg) 68.04 kg 68.493 kg 72.576 kg     Body mass index is 20.92 kg/m.  General:  Well nourished, well developed, in no acute distress HEENT: normal Lymph: no adenopathy Neck: no JVD Endocrine:  No thryomegaly Vascular: No carotid bruits; FA pulses 2+ bilaterally without bruits. Pedal pulses are poor.  Cardiac:  normal S1, S2; RRR; no murmur  Lungs:  clear to auscultation bilaterally, no wheezing, rhonchi or rales  Abd: soft, nontender, no hepatomegaly  Ext: no edema Musculoskeletal:  No deformities, BUE and BLE strength normal and equal Skin: warm and dry  Neuro:  CNs 2-12 intact, no focal abnormalities noted Psych:  Normal affect    EKG:  The ECG that was done today  was personally reviewed and demonstrates NSR with PVCs. Inferior ST elevation that is new since October with small Q waves.   Relevant CV  Studies: Cardiac Catheterization Procedure Note  Name: Bueford Arp MRN: [id number] DOB: 1950/03/10  Procedure: Left Heart Cath, Selective Coronary Angiography, LV angiography  Indication: NSTEMI                                   Procedural Details: The right wrist was prepped, draped, and anesthetized with 1% lidocaine. Using the modified Seldinger technique, a 5 French sheath was introduced into the right radial artery. 3 mg of verapamil was administered through the sheath, weight-based unfractionated heparin was administered intravenously. Standard Judkins catheters were used for selective coronary angiography and left ventriculography. Catheter exchanges were performed over an exchange length guidewire. There were no immediate procedural complications. A TR band was used for radial hemostasis at the completion of the procedure.  The patient was transferred to the post catheterization recovery area for further monitoring.  Procedural Findings: Hemodynamics: AO 107/60 LV 120/15  Coronary angiography: Coronary dominance: right  Left mainstem: LM is same as LAD given anomalous LCx off the RCA.   Left anterior descending (LAD): 20% ostial stenosis, 30% proximal stenosis, 30% mid LAD stenosis.   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  Small caliber vessel with hazy 95% proximal stenosis.  TIMI 3 flow down vessel.   Right coronary artery (RCA): 30% proximal, 30% mid RCA stenosis.  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  I reviewed the films with Dr. [place].  The patient is no longer having chest pain and the vessel is quite small in caliber.  We planned for medical management.  Will start Plavix  and will treat with heparin gtt  for 24-48 hours.   Loralie Champagne 01/05/2012, 4:42 PM   Echo 01/06/12: Study Conclusions   - Left ventricle: The cavity size was normal. There was mild  focal basal hypertrophy of the septum. Systolic function  was vigorous. The estimated ejection fraction was in the  range of 65% to 70%. Wall motion was normal; there were no  regional wall motion abnormalities. Features are  consistent with a pseudonormal left ventricular filling  pattern, with concomitant abnormal relaxation and  increased filling pressure (grade 2 diastolic  dysfunction).  - Aortic valve: Mildly calcified annulus. Probably  trileaflet; mildly calcified leaflets. There was no  stenosis. Trivial regurgitation.  - Mitral valve: Calcified annulus. Trivial regurgitation.  - Tricuspid valve: Trivial regurgitation.  - Pulmonary arteries: PA peak pressure: 42m Hg (S).  - Pericardium, extracardiac: There was no pericardial  effusion.    Laboratory Data:  High Sensitivity Troponin:  No results for input(s): TROPONINIHS in the last 720 hours.    ChemistryNo results for input(s): NA, K, CL, CO2, GLUCOSE, BUN, CREATININE, CALCIUM, GFRNONAA, GFRAA, ANIONGAP in the last 168 hours.  No results for input(s): PROT, ALBUMIN, AST, ALT, ALKPHOS, BILITOT in the last 168 hours. HematologyNo results for input(s): WBC, RBC, HGB, HCT, MCV, MCH, MCHC, RDW, PLT in the last 168 hours. BNPNo results for input(s): BNP, PROBNP in the last 168 hours.  DDimer No results for input(s): DDIMER in the last 168 hours.   Radiology/Studies:  DG Chest Port 1 View  Result Date: 04/24/2019 CLINICAL DATA:  Chest pain EXAM: PORTABLE CHEST 1 VIEW COMPARISON:  07/30/2017 chest radiograph. FINDINGS: In Stable cardiomediastinal silhouette with normal heart size. No pneumothorax. No pleural effusion. Lungs appear clear, with no acute consolidative airspace disease and no pulmonary edema. IMPRESSION: No  active disease. Electronically Signed   By: JIlona SorrelM.D.   On: 04/24/2019 08:24       TIMI Risk Score for ST  Elevation MI:   The patient's TIMI risk score is  , which indicates a  % risk of all cause mortality at 30 days.    Assessment and Plan:   1. Acute inferior STEMI. Given ASA and IV heparin in ED. Will proceed with emergent cardiac cath and PCI as indicated. Plan beta blocker and high dose statin.  2. HLD. Check baseline lipid panel. Needs high dose statin 3. HTN. Controlled. On no meds 4. Tobacco abuse. Counsel on smoking cessation. 5. History of small CVA. 6. Gallbladder disease. 7. History of hepatitis C treated.   Severity of Illness: The appropriate patient status for this patient is INPATIENT. Inpatient status is judged to be reasonable and necessary in order to provide the required intensity of service to ensure the patient's safety. The patient's presenting symptoms, physical exam findings, and initial radiographic and laboratory data in the context of their chronic comorbidities is felt to place them at high risk for further clinical deterioration. Furthermore, it is not anticipated that the patient will be medically stable for discharge from the hospital within 2 midnights of admission. The following factors support the patient status of inpatient.   " The patient's presenting symptoms include chest pain. " The worrisome physical exam findings include none. " The initial radiographic and laboratory data are worrisome because of ST elevation in inferior leads. " The chronic co-morbidities include HTN, HLD, tobacco use.   * I certify that at the point of admission it is my clinical judgment that the patient will require inpatient hospital care spanning beyond  2 midnights from the point of admission due to high intensity of service, high risk for further deterioration and high frequency of surveillance required.*    For questions or updates, please contact [place] Please consult [email or web address] for contact info under        Signed, Giovanna Kemmerer [place], MD  04/24/2019 8:37 AM

## 2019-04-24 NOTE — Progress Notes (Signed)
Approximately 1100 patient complained of crushing 10/10 pain similar to what he experienced on arrival to the ED. 3 nitro tabs were administered 5 minutes apart and  4L oxygen applied via cannula. Dr. [place] was paged and ordered a 2 mg push of morphine and patient was readied to return to the cath lab.

## 2019-04-24 NOTE — Progress Notes (Signed)
Observed April Singleton RN remove sheath at 1600. Site Level 1.

## 2019-04-24 NOTE — ED Notes (Signed)
Pt's watch, wallet, and phone put in a red biohazard bag and delivered to cath lab with patient inside patient's belonging bag.

## 2019-04-24 NOTE — ED Provider Notes (Signed)
[facility] EMERGENCY DEPARTMENT Provider Note   CSN: [id number] Arrival date & time: 04/24/19  0744     History Chief Complaint  Patient presents with  . Chest Pain    Hunter Mcmillan is a 69 y.o. male.  Past medical history CAD, smoking, hypertension, CKD, hep C post [facility] presents to ER with chest pain.  Pain started approximately 4 hours prior to presentation, described as crushing, chest pressure, upper chest.  Does not feel short of breath but is having a hard time taking a deep breath.  Pain improved after getting aspirin, no change after nitro.  States has no associated abdominal pain.  No cough or fever.  Still smokes.  Had a milder episode of similar symptoms a few days ago.  Symptoms are occurring at rest.  No numbness, weakness, tingling in extremities or face.   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  Left anterior descending (LAD): 20% ostial stenosis, 30% proximal stenosis, 30% mid LAD stenosis.  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 Small caliber vessel with hazy 95% proximal stenosis. TIMI 3 flow down vessel.  Right coronary artery (RCA): 30% proximal, 30% mid RCA stenosis.  Left ventriculography: Left ventricular systolic function is normal, LVEF is estimated at 60-65%, there is mild mitral regurgitation   HPI     Past Medical History:  Diagnosis Date  . Alcoholism ([place])   . CAD (coronary artery disease) 11/13   a. 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 Chicken pox   . Depression   . ETOH abuse   . Genital warts   . Heart attack ([place])   . Hepatic steatosis   . Hepatitis C    a. s/p Ribavirin and  Interferon ~ 2003 in [place], [place] => stopped after 6 mos due to neutropenia - viral load noted to be low at that time;  no follow up since;   b. abdominal u/s 11/13:  diffuse hepatic steatosis and/or hepatocellular disease    . Hx of echocardiogram    a. Echo 01/06/12: Mild focal basal hypertrophy the septum, vigorous LV, EF 65-70%, Gr 2 diast dysfn, trivial AI, MAC, trivial MR, trivial TR, PASP 36  . Hyperlipidemia   . Hypertension   . Non-STEMI (non-ST elevated myocardial infarction) ([place])   . Stroke [facility])    "minor" pt reported  . Thrombocytopenia ([place])   . Tobacco abuse     Patient Active Problem List   Diagnosis Date Noted  . Abdominal aortic aneurysm (AAA) 3.0 cm to 5.5 cm in diameter in male ([place]) 01/06/2017  . HTN (hypertension) 04/09/2015  . Hyperpigmentation of skin 10/05/2014  . COPD (chronic obstructive pulmonary disease) ([place]) 06/26/2014  . Sacroiliac joint dysfunction of right side 06/26/2014  . Alcoholism ([place])   . Cocaine abuse ([place]) 11/18/2013  . Scrotal mass 07/21/2013  . Cirrhosis ([place]) 05/28/2013  . Routine general medical examination at a health care facility 07/19/2012  . Abnormal TSH 04/09/2012  . Coronary atherosclerosis of native coronary artery 01/14/2012  . Hx of non-ST elevation myocardial infarction (NSTEMI) 01/05/2012  . Tobacco abuse 01/05/2012  . Chronic hepatitis C without hepatic coma ([place]) 01/05/2012  .  Depression, recurrent ([place]) 01/05/2012  . Thrombocytopenia ([place]) 01/05/2012    Past Surgical History:  Procedure Laterality Date  . EYE SURGERY  1954   corrective surgery for crossed eyes  . EYE SURGERY  1959  . LEFT HEART CATHETERIZATION WITH CORONARY ANGIOGRAM N/A 01/05/2012   Procedure: LEFT HEART CATHETERIZATION WITH CORONARY ANGIOGRAM;  Surgeon: Larey Dresser, MD;  Location[facility] CATH LAB;  Service: Cardiovascular;  Laterality: N/A;       Family History  Problem Relation Age of Onset  . Heart attack Mother 48  . Diabetes Mother   .  Heart disease Mother   . Hyperlipidemia Father   . Hypertension Father   . Diabetes Maternal Grandfather   . Heart disease Maternal Grandfather   . Early death Maternal Grandfather   . Heart attack Maternal Grandfather   . Hearing loss Sister   . Hyperlipidemia Sister   . Depression Brother   . Diabetes Brother   . Hyperlipidemia Brother   . Early death Maternal Grandmother   . Heart disease Maternal Grandmother   . Heart attack Maternal Grandmother   . Hearing loss Sister     Social History   Tobacco Use  . Smoking status: Light Tobacco Smoker    Packs/day: 0.10    Types: Cigarettes  . Smokeless tobacco: Never Used  . Tobacco comment: almost quit, very rare, 1-2 daily  Substance Use Topics  . Alcohol use: Not Currently    Alcohol/week: 0.0 standard drinks    Comment: Last drink: 10 days ago   . Drug use: Yes    Types: Cocaine    Comment: history of cocaine last use 2014    Home Medications Prior to Admission medications   Medication Sig Start Date End Date Taking? Authorizing Provider  cyclobenzaprine (FLEXERIL) 10 MG tablet TAKE 1 TABLET BY MOUTH TWICE DAILY AS NEEDED FOR MUSCLE SPASM 01/12/19   Copland, Gay Filler, MD  diphenhydrAMINE (BENADRYL) 25 MG tablet Take 25 mg by mouth every 6 (six) hours as needed for allergies.    [provider]  ibuprofen (ADVIL) 200 MG tablet Take 600-800 mg by mouth every 6 (six) hours as needed (For pain.).    [provider]  Multiple Vitamin (MULTIVITAMIN WITH MINERALS) TABS tablet Take 1 tablet by mouth daily.    [provider]  nitroGLYCERIN (NITROSTAT) 0.4 MG SL tablet Place 1 tablet (0.4 mg total) under the tongue every 5 (five) minutes as needed for chest pain. 12/22/18   Copland, Gay Filler, MD  ondansetron (ZOFRAN) 4 MG tablet Take 1 tablet (4 mg total) by mouth every 8 (eight) hours as needed for nausea or vomiting. Patient not taking: Reported on 12/22/2018 10/12/18   Caccavale, Sophia, PA-C     Allergies    Patient has no known allergies.  Review of Systems   Review of Systems  Constitutional: Negative for chills and fever.  HENT: Negative for ear pain and sore throat.   Eyes: Negative for pain and visual disturbance.  Respiratory: Negative for cough and shortness of breath.   Cardiovascular: Positive for chest pain and palpitations.  Gastrointestinal: Negative for abdominal pain and vomiting.  Genitourinary: Negative for dysuria and hematuria.  Musculoskeletal: Negative for arthralgias and back pain.  Skin: Negative for color change and rash.  Neurological: Negative for seizures and syncope.  All other systems reviewed and are negative.   Physical Exam Updated Vital Signs BP 131/71   Pulse (!) 36   Temp 97.7 F (36.5 C) (  Oral)   Resp (!) 26   Ht _0  (1.803 m)   Wt 68 kg   SpO2 98%   BMI 20.92 kg/m   Physical Exam Vitals and nursing note reviewed.  Constitutional:      Appearance: He is well-developed.     Comments: Appears somewhat uncomfortable due to pain but not in acute distress  HENT:     Head: Normocephalic and atraumatic.  Eyes:     Conjunctiva/sclera: Conjunctivae normal.  Cardiovascular:     Rate and Rhythm: Normal rate. Rhythm irregular.     Heart sounds: No murmur.  Pulmonary:     Effort: Pulmonary effort is normal. No respiratory distress.     Breath sounds: Normal breath sounds.  Abdominal:     Palpations: Abdomen is soft.     Tenderness: There is no abdominal tenderness.  Musculoskeletal:     Cervical back: Neck supple.  Skin:    General: Skin is warm and dry.     Capillary Refill: Capillary refill takes less than 2 seconds.  Neurological:     General: No focal deficit present.     Mental Status: He is alert and oriented to person, place, and time.     ED Results / Procedures / Treatments   Labs (all labs ordered are listed, but only abnormal results are displayed) Labs Reviewed  APTT - Abnormal; Notable for the  following components:      Result Value   aPTT 23 (*)    All other components within normal limits  RESPIRATORY PANEL BY RT PCR (FLU A&B, COVID)  PROTIME-INR  CBC WITH DIFFERENTIAL/PLATELET  COMPREHENSIVE METABOLIC PANEL  HEMOGLOBIN A1C  LIPID PANEL  TROPONIN I (HIGH SENSITIVITY)    EKG EKG Interpretation  Date/Time:  Sunday April 24 2019 07:52:50 EST Ventricular Rate:  84 PR Interval:    QRS Duration: 112 QT Interval:  470 QTC Calculation: 433 R Axis:   60 Text Interpretation: Ectopic atrial rhythm Multiple premature complexes, vent & supraven Short PR interval ST elevations in II, III, aVF new t wave inversions in lateral leads Acute STEMI Confirmed by Madalyn Rob [phone]) on 04/24/2019 8:13:09 AM   Radiology DG Chest Port 1 View  Result Date: 04/24/2019 CLINICAL DATA:  Chest pain EXAM: PORTABLE CHEST 1 VIEW COMPARISON:  07/30/2017 chest radiograph. FINDINGS: In Stable cardiomediastinal silhouette with normal heart size. No pneumothorax. No pleural effusion. Lungs appear clear, with no acute consolidative airspace disease and no pulmonary edema. IMPRESSION: No active disease. Electronically Signed   By: Ilona Sorrel M.D.   On: 04/24/2019 08:24    Procedures .Critical Care Performed by: Lucrezia Starch, MD Authorized by: Lucrezia Starch, MD   Critical care provider statement:    Critical care time (minutes):  40   Critical care was time spent personally by me on the following activities:  Discussions with consultants, evaluation of patient's response to treatment, examination of patient, ordering and performing treatments and interventions, ordering and review of laboratory studies, ordering and review of radiographic studies, pulse oximetry, re-evaluation of patient's condition, obtaining history from patient or surrogate and review of old charts   (including critical care time)  Medications Ordered in ED Medications  0.9 %  sodium chloride infusion (has no  administration in time range)  fentaNYL (SUBLIMAZE) 100 MCG/2ML injection (has no administration in time range)  heparin injection 4,000 Units (4,000 Units Intravenous Given 04/24/19 0807)  sodium chloride 0.9 % bolus 1,000 mL (1,000 mLs Intravenous New Bag/Given  04/24/19 0808)  fentaNYL (SUBLIMAZE) injection 50 mcg (50 mcg Intravenous Given 04/24/19 0807)  fentaNYL (SUBLIMAZE) injection 50 mcg (50 mcg Intravenous Given 04/24/19 6387)    ED Course  I have reviewed the triage vital signs and the nursing notes.  Pertinent labs & imaging results that were available during my care of the patient were reviewed by me and considered in my medical decision making (see chart for details).  Clinical Course as of Apr 23 845  Sun Apr 24, 2019  0753 Reviewed initial EKG, poor quality, asked after repeat   [RD]  0758 Still difficulty with getting good baseline on EKG, noted ST segment changes, T wave inversions, will discuss emergently with STEMI doc   [RD]  0800 Discussed with STEMI doc, will call STEMI alert   [RD]  0817 Rechecked, pain down to 7, will give additional 72mg fentanyl   [RD]  0826 Cardiology - JMartiniqueat bedside   [RD]  [phone] cath lab   [RD]    Clinical Course User Index [RD] DLucrezia Starch MD   MDM Rules/Calculators/A&P                      69year old male past medical history CAD, smoking, hypertension, CKD presents to ER with chest pain.  Initial EKG markedly changed from baseline, noted some ST segment changes and T wave inversions, however poor baseline.  Repeated EKG and contacted cardiology STEMI doc emergently.  Given history and EKG changes, called STEMI alert.  Prior to arrival given ASA, nitro.  Here gave patient heparin bolus, fentanyl for pain.  Taken emergently by cardiology to Cath Lab.  Dr. JMartiniqueaccepting.   Final Clinical Impression(s) / ED Diagnoses Final diagnoses:  ST elevation myocardial infarction (STEMI), unspecified artery ([facility]    Rx / DC  Orders ED Discharge Orders    None       DLucrezia Starch MD 04/24/19 [phone]

## 2019-04-24 NOTE — Progress Notes (Signed)
ANTICOAGULATION CONSULT NOTE - Initial Consult  Pharmacy Consult for tirofiban Indication: chest pain/ACS  No Known Allergies  Patient Measurements: Height: _0  (180.3 cm) Weight: 150 lb (68 kg) IBW/kg (Calculated) : 75.3  Vital Signs: Temp: 97.7 F (36.5 C) (02/21 0749) Temp Source: Oral (02/21 0749) BP: 144/78 (02/21 0830) Pulse Rate: 36 (02/21 0816)  Labs: Recent Labs    04/24/19 0758  HGB 13.3  HCT 40.3  PLT 112*  APTT 23*  LABPROT 15.1  INR 1.2  CREATININE 1.36*  TROPONINIHS 11,516*    Estimated Creatinine Clearance: 49.3 mL/min (A) (by C-G formula based on SCr of 1.36 mg/dL (H)).   Medical History: Past Medical History:  Diagnosis Date  . Alcoholism ([place])   . CAD (coronary artery disease) 11/13   a. 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 Chicken pox   . Depression   . ETOH abuse   . Genital warts   . Heart attack ([place])   . Hepatic steatosis   . Hepatitis C    a. s/p [name] ~ 2003 in [place], [place] => stopped after 6 mos due to neutropenia - viral load noted to be low at that time;  no follow up since;   b. abdominal u/s 11/13:  diffuse hepatic steatosis and/or hepatocellular disease    . Hx of echocardiogram    a. Echo 01/06/12: Mild focal basal hypertrophy the septum, vigorous LV, EF 65-70%, Gr 2 diast dysfn, trivial AI, MAC, trivial MR, trivial TR, PASP 36  . Hyperlipidemia   . Hypertension   . Non-STEMI (non-ST elevated myocardial infarction) ([place])   . Stroke [facility])    "minor" pt reported  . Thrombocytopenia ([place])   . Tobacco abuse    Assessment: 69 year old male presented to [facility] as code stemi. Taken urgently to cath lab for DES/PCI of RCA.   Orders to continue tirofiban x 18 hours post cath. CBC wnl, scr slightly elevated, will dose  adjust.   Goal of Therapy:  Monitor platelets by anticoagulation protocol: Yes   Plan:  Tirofiban 0.078mg/kg/min x 18 hours Check CBC in am  FErin HearingPharmD., BCPS Clinical Pharmacist 04/24/2019 10:32 AM

## 2019-04-24 NOTE — ED Triage Notes (Signed)
Pt here from home for central chest pain that woke him up this morning at 0400. Pain worse with inspiration. Has had this same pain multiple times before and in the past it has been due to a gallbladder problem. 324 ASA and 2 nitro given PTA with some relief.

## 2019-04-25 DIAGNOSIS — E785 Hyperlipidemia, unspecified: Secondary | ICD-10-CM | POA: Clinically undetermined

## 2019-04-25 DIAGNOSIS — Z955 Presence of coronary angioplasty implant and graft: Secondary | ICD-10-CM

## 2019-04-25 DIAGNOSIS — I25110 Atherosclerotic heart disease of native coronary artery with unstable angina pectoris: Secondary | ICD-10-CM

## 2019-04-25 DIAGNOSIS — I2119 ST elevation (STEMI) myocardial infarction involving other coronary artery of inferior wall: Principal | ICD-10-CM

## 2019-04-25 LAB — CBC
HCT: 40.3 % (ref 39.0–52.0)
Hemoglobin: 13 g/dL (ref 13.0–17.0)
MCH: 34.1 pg — ABNORMAL HIGH (ref 26.0–34.0)
MCHC: 32.3 g/dL (ref 30.0–36.0)
MCV: 105.8 fL — ABNORMAL HIGH (ref 80.0–100.0)
Platelets: 114 10*3/uL — ABNORMAL LOW (ref 150–400)
RBC: 3.81 MIL/uL — ABNORMAL LOW (ref 4.22–5.81)
RDW: 13.1 % (ref 11.5–15.5)
WBC: 9.5 10*3/uL (ref 4.0–10.5)
nRBC: 0 % (ref 0.0–0.2)

## 2019-04-25 LAB — POCT ACTIVATED CLOTTING TIME
Activated Clotting Time: 279 seconds
Activated Clotting Time: 263 seconds
Activated Clotting Time: 197 seconds
Activated Clotting Time: 158 seconds

## 2019-04-25 LAB — BASIC METABOLIC PANEL
Anion gap: 13 (ref 5–15)
BUN: 23 mg/dL (ref 8–23)
CO2: 18 mmol/L — ABNORMAL LOW (ref 22–32)
Calcium: 8.8 mg/dL — ABNORMAL LOW (ref 8.9–10.3)
Chloride: 100 mmol/L (ref 98–111)
Creatinine, Ser: 1.13 mg/dL (ref 0.61–1.24)
GFR calc Af Amer: >60 mL/min (ref >60)
GFR calc non Af Amer: >60 mL/min (ref >60)
Glucose, Bld: 122 mg/dL — ABNORMAL HIGH (ref 70–99)
Potassium: 5 mmol/L (ref 3.5–5.1)
Sodium: 131 mmol/L — ABNORMAL LOW (ref 135–145)

## 2019-04-25 LAB — POCT I-STAT, CHEM 8
BUN: 22 mg/dL (ref 8–23)
Calcium, Ion: 1.14 mmol/L — ABNORMAL LOW (ref 1.15–1.40)
Chloride: 102 mmol/L (ref 98–111)
Creatinine, Ser: 0.9 mg/dL (ref 0.61–1.24)
Glucose, Bld: 154 mg/dL — ABNORMAL HIGH (ref 70–99)
HCT: 39 % (ref 39.0–52.0)
Hemoglobin: 13.3 g/dL (ref 13.0–17.0)
Potassium: 3.7 mmol/L (ref 3.5–5.1)
Sodium: 135 mmol/L (ref 135–145)
TCO2: 23 mmol/L (ref 22–32)

## 2019-04-25 LAB — MAGNESIUM: Magnesium: 1.8 mg/dL (ref 1.7–2.4)

## 2019-04-25 MED FILL — Amiodarone HCl Inj 150 MG/3ML (50 MG/ML): INTRAVENOUS | Qty: 3 | Status: AC

## 2019-04-25 NOTE — Plan of Care (Signed)
PATIENT CARE PLAN  Problem: Education: Goal: Understanding of CV disease, CV risk reduction, and recovery process will improve Outcome: Progressing   Problem: Activity: Goal: Ability to return to baseline activity level will improve Outcome: Progressing   Problem: Cardiovascular: Goal: Ability to achieve and maintain adequate cardiovascular perfusion will improve Outcome: Progressing Goal: Vascular access site(s) Level 0-1 will be maintained Outcome: Progressing   Problem: Health Behavior/Discharge Planning: Goal: Ability to safely manage health-related needs after discharge will improve Outcome: Progressing

## 2019-04-25 NOTE — Progress Notes (Addendum)
CARDIAC REHAB PHASE I   PRE:  Rate/Rhythm: 73 SR  BP:  Sitting: 101/63      SaO2: 93 RA  MODE:  Ambulation: 300 ft   POST:  Rate/Rhythm: 99 SR  BP:  Sitting: 112/80    SaO2: 95 RA  Pt ambulated in hallway standby assist with front wheel walker. Pt states his legs don't feel strong, but better than he thought. Pt returned to recliner. Pt given MI book, and heart healthy diet. Pt educated importance of ASA, Brilinta, and NTG. Reviewed restrictions, and site care. Encouraged pt to walk again later today. Will refer to CRP II [place]. Will continue to follow.  [phone] Reynold Bowen, RN BSN 04/25/2019 1:31 PM

## 2019-04-25 NOTE — Progress Notes (Addendum)
Progress Note  Patient Name: Hunter Mcmillan Date of Encounter: 04/25/2019  Primary Cardiologist: No primary care provider on file. New: Dr.Jordan  Patient profile  69 y.o. 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 Had ST elevation on inferior leads on EKG and troponin up to 11,516> 18,332.  He was admitted for inferior STEMI and underwent heart cath 2/21, (through rt radial artery) that showed severe two-vessel CAD: 100% occlusion of proximal RCA, with heavy thrombosis and 90% small LCx stenosis.  Successful PCI of the RCA with thrombectomy and stenting with DES x2 performed.  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  Of note, patient had a second heart cath in about 2 hours after first cardiac cath due to developing severe substernal chest pain that was not improved with nitroglycerin or IV morphine.  Second cardiac cath performed through right femoral artery. It did not show any new changes.  Recommended to continue medical management and colchicine added for possible pericardial component to pain.  Subjective   Patient was seen and evaluated at bedside this morning.  He is doing well and denies any chest pain, shortness of breath, dizziness or palpitation. He underwent cardiac cath, PCI and stent placement yesterday around 9-10 AM.  After transferring to CCU, and in an hour, he developed severe substernal chest pain that did not respond to nitroglycerin and morphine.  He was sent  to Cath Lab again.  He reports that he has been same free since then. Hee denies any chest pain or shortness of breath.  Inpatient Medications    Scheduled Meds: . aspirin EC  81 mg Oral Daily  . atorvastatin  80 mg Oral q1800  . Chlorhexidine Gluconate Cloth  6 each  Topical Q0600  . colchicine  0.6 mg Oral Daily  . enoxaparin (LOVENOX) injection  40 mg Subcutaneous Q24H  . metoprolol succinate  25 mg Oral Daily  . multivitamin with minerals  1 tablet Oral Daily  . sodium chloride flush  3 mL Intravenous Q12H  . ticagrelor  90 mg Oral BID   Continuous Infusions: . sodium chloride 10 mL/hr at 04/24/19 0840  . sodium chloride Stopped (04/25/19 0614)   PRN Meds: sodium chloride, acetaminophen, cyclobenzaprine, morphine injection, nitroGLYCERIN, ondansetron (ZOFRAN) IV, sodium chloride flush   Vital Signs    Vitals:   04/25/19 1400 04/25/19 1500 04/25/19 1534 04/25/19 1554  BP: 104/63 107/70  106/65  Pulse: (!) 126 71  71  Resp: (!) 27 (!) 23  20  Temp:   97.9 F (36.6 C) 99 F (37.2 C)  TempSrc:   Oral Oral  SpO2: 97% 96%  95%  Weight:      Height:        Intake/Output Summary (Last 24 hours) at 04/25/2019 1847 Last data filed at 04/25/2019 1700 Gross per 24 hour  Intake 1585.66 ml  Output 650 ml  Net 935.66 ml   Last 3 Weights 04/24/2019 12/22/2018 12/13/2018  Weight (lbs) 150 lb 151 lb 160 lb  Weight (kg) 68.04 kg 68.493 kg 72.576 kg      Telemetry    Sinus rhythm with PAC and PVCs- Personally Reviewed  ECG    Ordered but not performed yet today- Personally Reviewed  Physical Exam   GEN:  No acute distress.   Neck: No JVD Cardiac: RRR, no murmurs, rubs, or gallops.  Respiratory: Clear to auscultation bilaterally. GI: Soft, nontender, non-distended  Extremities: No edema; No deformity. No swelling, hematoma or other abnormality around right radial artery post heart cath. Mild bruise on right femoral artery site. No tenderness, swelling or active bleeding.  No evidence of acute hematoma. Neuro:  Nonfocal  Psych: Normal affect   Labs    High Sensitivity Troponin:   Recent Labs  Lab 04/24/19 0758 04/24/19 1100  TROPONINIHS 11,516* 18,332*      Chemistry Recent Labs  Lab 04/24/19 0758 04/24/19 0902 04/25/19 0212    NA 133* 135 131*  K 3.9 3.7 5.0  CL 96* 102 100  CO2 25  --  18*  GLUCOSE 214* 154* 122*  BUN 21 22 23   CREATININE 1.36* 0.90 1.13  CALCIUM 9.2  --  8.8*  PROT 7.1  --   --   ALBUMIN 3.7  --   --   AST 171*  --   --   ALT 124*  --   --   ALKPHOS 87  --   --   BILITOT 1.5*  --   --   GFRNONAA 53*  --  >60  GFRAA >60  --  >60  ANIONGAP 12  --  13     Hematology Recent Labs  Lab 04/24/19 0758 04/24/19 0902 04/25/19 0212  WBC 10.2  --  9.5  RBC 4.02*  --  3.81*  HGB 13.3 13.3 13.0  HCT 40.3 39.0 40.3  MCV 100.2*  --  105.8*  MCH 33.1  --  34.1*  MCHC 33.0  --  32.3  RDW 12.4  --  13.1  PLT 112*  --  114*    BNPNo results for input(s): BNP, PROBNP in the last 168 hours.   DDimer No results for input(s): DDIMER in the last 168 hours.   Radiology    CARDIAC CATHETERIZATION  Result Date: 04/24/2019  Prox Cx lesion is 90% stenosed.  Previously placed Prox RCA to Mid RCA stent (unknown type) is widely patent.  1. Continued excellent patency of the RCA stents. The LCx stenosis is chronic and this is a small vessel. Plan: continue medical management. Will remove femoral sheath manually. Will add colchicine for possible pericardial component to pain. Prn analgesics.   CARDIAC CATHETERIZATION  Result Date: 04/24/2019  04/26/2019 LM lesion is 25% stenosed.  Mid LAD lesion is 25% stenosed.  Prox Cx lesion is 90% stenosed.  Ost RCA to Mid RCA lesion is 100% stenosed.  A drug-eluting stent was successfully placed using a STENT RESOLUTE ONYX 3.5X38.  A drug-eluting stent was successfully placed using a STENT RESOLUTE ONYX 3.5X30.  Post intervention, there is a 0% residual stenosis.  LV end diastolic pressure is normal.  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 Severe 2 vessel CAD    - culprit lesion is 100% occlusion of the proximal RCA with heavy thrombus    - 90% small LCx 3. Normal LVEDP 4. Successful PCI of the RCA with thrombectomy and stenting with DES x 2. Plan: DAPT  for at least one year. IV Aggrastat for 18 hours. Start beta blocker and high dose statin. Assess LV function by Echo.   DG Chest Port 1 View  Result Date: 04/24/2019 CLINICAL DATA:  Chest pain EXAM: PORTABLE CHEST 1 VIEW COMPARISON:  07/30/2017 chest radiograph. FINDINGS: In Stable cardiomediastinal silhouette with normal heart  size. No pneumothorax. No pleural effusion. Lungs appear clear, with no acute consolidative airspace disease and no pulmonary edema. IMPRESSION: No active disease. Electronically Signed   By: Delbert Phenix M.D.   On: 04/24/2019 08:24   ECHOCARDIOGRAM COMPLETE  Result Date: 04/24/2019    ECHOCARDIOGRAM REPORT   Patient Name:   Hunter Mcmillan Date of Exam: 04/24/2019 Medical Rec #:  [id number]        Height:       71.0 in Accession #:    [id number]       Weight:       150.0 lb Date of Birth:  1950-09-06        BSA:          1.866 m Patient Age:    68 years         BP:           110/63 mmHg Patient Gender: M                HR:           65 bpm. Exam Location:  Inpatient Procedure: 2D Echo, Cardiac Doppler and Color Doppler Indications:    CAD  History:        Patient has prior history of Echocardiogram examinations, most                 recent 01/06/2012.  Sonographer:    Roosvelt Maser RDCS Referring Phys: [id number] PETER M [place] IMPRESSIONS  1. Left ventricular ejection fraction, by estimation, is 40 to 45%. The left ventricle has mildly decreased function. The left ventricle demonstrates regional wall motion abnormalities (see scoring diagram/findings for description). There is mild left ventricular hypertrophy. Left ventricular diastolic parameters are consistent with Grade I diastolic dysfunction (impaired relaxation).  2. Right ventricular systolic function is severely reduced. The right ventricular size is mildly enlarged. There is mildly elevated pulmonary artery systolic pressure.  3. The mitral valve is grossly normal. Trivial mitral valve regurgitation.  4. Tricuspid valve  regurgitation is mild to moderate.  5. The aortic valve has an indeterminant number of cusps. Aortic valve regurgitation is not visualized.  6. The inferior vena cava is dilated in size with <50% respiratory variability, suggesting right atrial pressure of 15 mmHg. FINDINGS  Left Ventricle: Left ventricular ejection fraction, by estimation, is 40 to 45%. The left ventricle has mildly decreased function. The left ventricle demonstrates regional wall motion abnormalities. The left ventricular internal cavity size was normal in size. There is mild left ventricular hypertrophy. Left ventricular diastolic parameters are consistent with Grade I diastolic dysfunction (impaired relaxation).  LV Wall Scoring: The entire inferior wall, basal inferolateral segment, and basal anterolateral segment are hypokinetic. The entire anterior wall, mid and distal lateral wall, entire septum, mid anterolateral segment, and apex are normal. Right Ventricle: The right ventricular size is mildly enlarged. No increase in right ventricular wall thickness. Right ventricular systolic function is severely reduced. There is mildly elevated pulmonary artery systolic pressure. The tricuspid regurgitant velocity is 2.09 m/s, and with an assumed right atrial pressure of 15 mmHg, the estimated right ventricular systolic pressure is 32.5 mmHg. Left Atrium: Left atrial size was normal in size. Right Atrium: Right atrial size was normal in size. Pericardium: There is no evidence of pericardial effusion. Mitral Valve: The mitral valve is grossly normal. Trivial mitral valve regurgitation. Tricuspid Valve: The tricuspid valve is grossly normal. Tricuspid valve regurgitation is mild to moderate. Aortic Valve: The aortic valve has an indeterminant  number of cusps. Aortic valve regurgitation is not visualized. There is mild calcification of the aortic valve. Pulmonic Valve: The pulmonic valve was not well visualized. Pulmonic valve regurgitation is not  visualized. Aorta: The aortic root is normal in size and structure. Venous: The inferior vena cava is dilated in size with less than 50% respiratory variability, suggesting right atrial pressure of 15 mmHg. IAS/Shunts: No atrial level shunt detected by color flow Doppler.  LEFT VENTRICLE PLAX 2D LVIDd:         3.40 cm     Diastology LVIDs:         2.40 cm     LV e' lateral:   8.38 cm/s LV PW:         1.30 cm     LV E/e' lateral: 5.1 LV IVS:        1.10 cm     LV e' medial:    5.18 cm/s LVOT diam:     2.00 cm     LV E/e' medial:  8.3 LV SV:         41.47 ml LV SV Index:   22.22 LVOT Area:     3.14 cm  LV Volumes (MOD) LV vol d, MOD A2C: 55.3 ml LV vol d, MOD A4C: 77.5 ml LV vol s, MOD A2C: 35.2 ml LV vol s, MOD A4C: 41.2 ml LV SV MOD A2C:     20.1 ml LV SV MOD A4C:     77.5 ml LV SV MOD BP:      26.9 ml RIGHT VENTRICLE            IVC RV Basal diam:  4.60 cm    IVC diam: 2.20 cm RV Mid diam:    4.20 cm RV S prime:     5.63 cm/s TAPSE (M-mode): 0.7 cm LEFT ATRIUM             Index       RIGHT ATRIUM           Index LA diam:        3.10 cm 1.66 cm/m  RA Area:     14.20 cm LA Vol (A2C):   52.0 ml 27.87 ml/m RA Volume:   37.80 ml  20.26 ml/m LA Vol (A4C):   41.6 ml 22.29 ml/m LA Biplane Vol: 50.5 ml 27.06 ml/m  AORTIC VALVE LVOT Vmax:   79.00 cm/s LVOT Vmean:  44.700 cm/s LVOT VTI:    0.132 m  AORTA Ao Root diam: 2.90 cm MITRAL VALVE               TRICUSPID VALVE MV Area (PHT): 2.32 cm    TR Peak grad:   17.5 mmHg MV Decel Time: 327 msec    TR Vmax:        209.00 cm/s MV E velocity: 42.80 cm/s MV A velocity: 58.70 cm/s  SHUNTS MV E/A ratio:  0.73        Systemic VTI:  0.13 m                            Systemic Diam: 2.00 cm Nona Dell MD Electronically signed by Nona Dell MD Signature Date/Time: 04/24/2019/5:13:12 PM    Final     Cardiac Studies   Cardiac cath 04/24/2019 ~9 AM  Ost LM lesion is 25% stenosed.  Mid LAD lesion is 25% stenosed.  Prox Cx lesion is 90% stenosed.  Ost RCA to  Mid  RCA lesion is 100% stenosed.  A drug-eluting stent was successfully placed using a STENT RESOLUTE ONYX 3.5X38.  A drug-eluting stent was successfully placed using a STENT RESOLUTE ONYX 3.5X30.  Post intervention, there is a 0% residual stenosis.  LV end diastolic pressure is normal.   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 Severe 2 vessel CAD    - culprit lesion is 100% occlusion of the proximal RCA with heavy thrombus    - 90% small LCx 3. Normal LVEDP 4. Successful PCI of the RCA with thrombectomy and stenting with DES x 2.  Plan: DAPT for at least one year. IV Aggrastat for 18 hours. Start beta blocker and high dose statin. Assess LV function by Echo.    Cardiac cath: 04/24/2019 around 12 PM  Prox Cx lesion is 90% stenosed.  Previously placed Prox RCA to Mid RCA stent (unknown type) is widely patent.   1. Continued excellent patency of the RCA stents. The LCx stenosis is chronic and this is a small vessel.   Plan: continue medical management. Will remove femoral sheath manually. Will add colchicine for possible pericardial component to pain. Prn analgesics  Diagnostic Dominance: Right   Patient Profile     69 y.o. 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 Had ST elevation on inferior leads on EKG and troponin up to 11,516> 18,332.  He was admitted for inferior STEMI and underwent heart cath 2/21, that showed severe two-vessel CAD: 100% occlusion of proximal RCA, with heavy thrombosis and 90% small LCx stenosis.  Successful PCI of the RCA with thrombectomy and stenting with DES x2 performed.  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  Of note, patient had a second heart cath in about an hour after first cardiac cath due to  developing severe substernal chest pain that was not improved with nitroglycerin or IV morphine.  Second cardiac cath did not show any new changes.  Recommended to continue medical management and colchicine added for possible pericardial component to pain.   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 -Stable lesion when compared to prior catheterization  Echo 04/24/2019: Showed EF of 40 to 45% and LV regional wall motion abnormalities (hypokinesia of entire inferior wall, basal inferolateral segment and basal anterolateral segment) -> has no signs or symptoms of acute combined systolic and diastolic heart failure Also has grade 1 diastolic dysfunction.  Patient is doing well today.  No further chest pain, no shortness of breath, dizziness or palpitations No symptoms of heart failure.  Anticipating that his low EF will improve.  -Continue dual antiplatelet therapy with aspirin 81 mg daily and Brilinta 90 mg daily for at least a year -Continue Lipitor 80 mg daily --we will check fasting lipid panel -Continue metoprolol succinate 25 mg daily. Monitor BP -Can continue colchicine for probable pericarditis for now.  No GI side  effect reported. -Not on ACE inhibitor or ARB, given soft blood pressure and also borderline potassium at 5. ->  With reduced EF, would like to have ACE inhibitor/ARB on board, however need to monitor renal function post cath and blood pressure. -Continue cardiac monitoring -Continue supplemental oxygen to keep O2 saturation more than 92% -Cardiac rehab team to evaluate and ambulate patient -If can tolerate ambulation and remains stable, will transfer to cardiac telemetry today and will consider discharge home tomorrow  Continue smoking cessation counseling. -Will follow-up outpatient with Dr. [place] (as a new patient) after discharge  Mild, non significant metabolic derangement: Na:131, K:5 Not on any drugs that induce hyperkalemia (IV Hep or ACE I/ARB. And not on potassium sparing diuretics)  -Will monitor -BMP tomorrow morning -Will do lab work up for hyponatremia if worsens  For questions or updates, please contact [facility] Please consult [email or web address] for contact info under        Signed, Bryan Lemma, MD  04/25/2019, 6:47 PM

## 2019-04-26 ENCOUNTER — Telehealth: Payer: Self-pay | Admitting: Cardiology

## 2019-04-26 ENCOUNTER — Encounter (HOSPITAL_COMMUNITY): Payer: Self-pay | Admitting: Cardiology

## 2019-04-26 DIAGNOSIS — J431 Panlobular emphysema: Secondary | ICD-10-CM

## 2019-04-26 LAB — BASIC METABOLIC PANEL
Anion gap: 10 (ref 5–15)
BUN: 22 mg/dL (ref 8–23)
CO2: 23 mmol/L (ref 22–32)
Calcium: 8.4 mg/dL — ABNORMAL LOW (ref 8.9–10.3)
Chloride: 101 mmol/L (ref 98–111)
Creatinine, Ser: 1.12 mg/dL (ref 0.61–1.24)
GFR calc Af Amer: >60 mL/min (ref >60)
GFR calc non Af Amer: >60 mL/min (ref >60)
Glucose, Bld: 105 mg/dL — ABNORMAL HIGH (ref 70–99)
Potassium: 3.7 mmol/L (ref 3.5–5.1)
Sodium: 134 mmol/L — ABNORMAL LOW (ref 135–145)

## 2019-04-26 LAB — LIPID PANEL
Cholesterol: 94 mg/dL (ref 0–200)
HDL: 16 mg/dL — ABNORMAL LOW (ref >40)
LDL Cholesterol: 65 mg/dL (ref 0–99)
Total CHOL/HDL Ratio: 5.9 RATIO
Triglycerides: 63 mg/dL (ref <150)
VLDL: 13 mg/dL (ref 0–40)

## 2019-04-26 LAB — HIV ANTIBODY (ROUTINE TESTING W REFLEX): HIV Screen 4th Generation wRfx: NONREACTIVE — AB

## 2019-04-26 MED ORDER — NITROGLYCERIN 0.4 MG SL SUBL: 0.4000 mg | SUBLINGUAL_TABLET | SUBLINGUAL | 1 refills | Status: DC | PRN

## 2019-04-26 MED ORDER — ATORVASTATIN CALCIUM 80 MG PO TABS: 80.0000 mg | ORAL_TABLET | Freq: Every day | ORAL | 6 refills | Status: DC

## 2019-04-26 MED ORDER — ALBUTEROL SULFATE HFA 108 (90 BASE) MCG/ACT IN AERS: 2.0000 | INHALATION_SPRAY | Freq: Four times a day (QID) | RESPIRATORY_TRACT | 6 refills | Status: DC | PRN

## 2019-04-26 MED ORDER — COLCHICINE 0.6 MG PO TABS: 0.6000 mg | ORAL_TABLET | Freq: Every day | ORAL | 0 refills | Status: DC

## 2019-04-26 MED ORDER — FLUTICASONE PROPIONATE HFA 110 MCG/ACT IN AERO: 2.0000 | INHALATION_SPRAY | Freq: Two times a day (BID) | RESPIRATORY_TRACT | 3 refills | Status: DC

## 2019-04-26 MED ORDER — METOPROLOL SUCCINATE ER 25 MG PO TB24: 25.0000 mg | ORAL_TABLET | Freq: Every day | ORAL | 6 refills | Status: DC

## 2019-04-26 MED ORDER — TICAGRELOR 90 MG PO TABS: 90.0000 mg | ORAL_TABLET | Freq: Two times a day (BID) | ORAL | 11 refills | Status: DC

## 2019-04-26 MED FILL — FLOVENT HFA 110 MCG INHALER: 110 | 30 days supply | Qty: 12 | Fill #0

## 2019-04-26 MED FILL — ATORVASTATIN CALCIUM 80 MG: 80 | 30 days supply | Qty: 30 | Fill #0

## 2019-04-26 MED FILL — NITROGLYCERIN 0.4 MG TAB SL: 0.4 | 8 days supply | Qty: 25 | Fill #0

## 2019-04-26 MED FILL — METOPROLOL SUCCINATE ER 25: 25 | 30 days supply | Qty: 30 | Fill #0

## 2019-04-26 MED FILL — COLCHICINE 0.6 MG TABS: 0.6 | 10 days supply | Qty: 10 | Fill #0

## 2019-04-26 MED FILL — BRILINTA 90 MG TABLET: 90 | 30 days supply | Qty: 60 | Fill #0

## 2019-04-26 MED FILL — ALBUTEROL SULFATE HFA 108 (: 108 (90 BAS | 25 days supply | Qty: 18 | Fill #0

## 2019-04-26 NOTE — Plan of Care (Signed)
  Problem: Activity: Goal: Ability to return to baseline activity level will improve Outcome: Progressing   Problem: Cardiovascular: Goal: Ability to achieve and maintain adequate cardiovascular perfusion will improve Outcome: Progressing Goal: Vascular access site(s) Level 0-1 will be maintained Outcome: Progressing   

## 2019-04-26 NOTE — TOC Benefit Eligibility Note (Signed)
Transition of Care [facility]) Benefit Eligibility Note    Patient Details  Name: Hunter Mcmillan MRN: [id number] Date of Birth: [birth year]   Medication/Dose: BRILINTA  90 MG BID  Covered?: Yes  Tier: 3 Drug  Prescription Coverage [facility]erred [facility]acy: [facility]  Spoke with Person/Company/Phone Number:: CRYSTAL @ [place] X # [phone]  Co-Pay: $ 47.00  Prior Approval: No  Deductible: Met  Additional Notes: TICAGRELOR : Crecencio Mc Phone Number: 04/26/2019, 11:08 AM

## 2019-04-26 NOTE — Discharge Instructions (Signed)
Ticagrelor oral tablet What is this medicine? TICAGRELOR (TYE ka GREL or) helps to prevent blood clots. This medicine is used to prevent heart attack, stroke, or other vascular events in people who have had a recent heart attack or who have severe chest pain. It is also used to lower the chance of stroke or heart attack in people with a medical condition called coronary artery disease. This medicine may be used for other purposes; ask your health care provider or pharmacist if you have questions. COMMON BRAND NAME(S): BRILINTA What should I tell my health care provider before I take this medicine? They need to know if you have any of these conditions:  bleeding disorders  bleeding in the brain  having surgery  history of irregular heartbeat  history of stomach bleeding  liver disease  an unusual or allergic reaction to ticagrelor, other medicines, foods, dyes, or preservatives  pregnant or trying to get pregnant  breast-feeding How should I use this medicine? Take this medicine by mouth with a glass of water. Follow the directions on the prescription label. You can take it with or without food. If it upsets your stomach, take it with food. Take your medicine at regular intervals. Hunter Mcmillan not take it more often than directed. Hunter Mcmillan not stop taking except on your doctor's advice. A special MedGuide will be given to you by the pharmacist with each prescription and refill. Be sure to read this information carefully each time. Talk to your pediatrician regarding the use of this medicine in children. Special care may be needed. Overdosage: If you think you have taken too much of this medicine contact a poison control center or emergency room at once. NOTE: This medicine is only for you. Hunter Mcmillan not share this medicine with others. What if I miss a dose? If you miss a dose, take it as soon as you can. If it is almost time for your next dose, take only that dose. Hunter Mcmillan not take double or extra doses. What  may interact with this medicine? Hunter Mcmillan not take this medicine with any of the following medications:  defibrotide  itraconazole This medicine may also interact with the following medications:  aspirin  certain antibiotics like clarithromycin, telithromycin, and rifampin  certain antiviral medicines for HIV or AIDS like atazanavir, indinavir, nelfinavir, ritonavir, and saquinavir  certain medicines for cholesterol like lovastatin and simvastatin  certain medicines for fungal infections like ketoconazole and voriconazole  certain medicines for seizures like carbamazepine, phenobarbital, and phenytoin  digoxin  narcotic medicines for pain  nefazodone This list may not describe all possible interactions. Give your health care provider a list of all the medicines, herbs, non-prescription drugs, or dietary supplements you use. Also tell them if you smoke, drink alcohol, or use illegal drugs. Some items may interact with your medicine. What should I watch for while using this medicine? Visit your healthcare professional for regular checks on your progress. Your condition will be monitored carefully while you are receiving this medicine. It is important not to miss any appointments. Notify your doctor or health care professional and seek emergency treatment if you develop breathing problems; changes in vision; chest pain; severe, sudden headache; pain, swelling, warmth in the leg; trouble speaking; sudden numbness or weakness of the face, arm, or leg. These can be signs that your condition has gotten worse. If you are going to have surgery or dental work, tell your doctor or health care professional that you are taking this medicine. You should take aspirin every  day with this medicine. Hunter Mcmillan not take more than 100 mg each day. Talk to your healthcare professional if you have questions. What side effects may I notice from receiving this medicine? Side effects that you should report to your doctor  or health care professional as soon as possible:  allergic reactions like skin rash, itching or hives, swelling of the face, lips, or tongue  breathing problems  fast or irregular heartbeat  feeling faint or light-headed, falls  signs and symptoms of bleeding such as bloody or black, tarry stools; red or dark-brown urine; spitting up blood or brown material that looks like coffee grounds; red spots on the skin; unusual bruising or bleeding from the eye, gums, or nose Side effects that usually Hunter Mcmillan not require medical attention (report to your doctor or health care professional if they continue or are bothersome):  breast enlargement in both males and females  diarrhea  dizziness  headache  tiredness  upset stomach This list may not describe all possible side effects. Call your doctor for medical advice about side effects. You may report side effects to FDA at 1-800-FDA-1088. Where should I keep my medicine? Keep out of the reach of children. Store at room temperature of 59 to 86 degrees F (15 to 30 degrees C). Throw away any unused medicine after the expiration date. NOTE: This sheet is a summary. It may not cover all possible information. If you have questions about this medicine, talk to your doctor, pharmacist, or health care provider.  2020 Elsevier/Gold Standard (2018-08-02 21:21:41) Radial Site Care  This sheet gives you information about how to care for yourself after your procedure. Your health care provider may also give you more specific instructions. If you have problems or questions, contact your health care provider. What can I expect after the procedure? After the procedure, it is common to have:  Bruising and tenderness at the catheter insertion area. Follow these instructions at home: Medicines  Take over-the-counter and prescription medicines only as told by your health care provider. Insertion site care  Follow instructions from your health care provider  about how to take care of your insertion site. Make sure you: ? Wash your hands with soap and water before you change your bandage (dressing). If soap and water are not available, use hand sanitizer. ? Change your dressing as told by your health care provider. ? Leave stitches (sutures), skin glue, or adhesive strips in place. These skin closures may need to stay in place for 2 weeks or longer. If adhesive strip edges start to loosen and curl up, you may trim the loose edges. Hunter Mcmillan not remove adhesive strips completely unless your health care provider tells you to Hunter Mcmillan that.  Check your insertion site every day for signs of infection. Check for: ? Redness, swelling, or pain. ? Fluid or blood. ? Pus or a bad smell. ? Warmth.  Hunter Mcmillan not take baths, swim, or use a hot tub until your health care provider approves.  You may shower 24-48 hours after the procedure, or as directed by your health care provider. ? Remove the dressing and gently wash the site with plain soap and water. ? Pat the area dry with a clean towel. ? Hunter Mcmillan not rub the site. That could cause bleeding.  Hunter Mcmillan not apply powder or lotion to the site. Activity   For 24 hours after the procedure, or as directed by your health care provider: ? Hunter Mcmillan not flex or bend the affected arm. ? Hunter Mcmillan not  push or pull heavy objects with the affected arm. ? Hunter Mcmillan not drive yourself home from the hospital or clinic. You may drive 24 hours after the procedure unless your health care provider tells you not to. ? Hunter Mcmillan not operate machinery or power tools.  Hunter Mcmillan not lift anything that is heavier than 10 lb (4.5 kg), or the limit that you are told, until your health care provider says that it is safe.  Ask your health care provider when it is okay to: ? Return to work or school. ? Resume usual physical activities or sports. ? Resume sexual activity. General instructions  If the catheter site starts to bleed, raise your arm and put firm pressure on the site. If the  bleeding does not stop, get help right away. This is a medical emergency.  If you went home on the same day as your procedure, a responsible adult should be with you for the first 24 hours after you arrive home.  Keep all follow-up visits as told by your health care provider. This is important. Contact a health care provider if:  You have a fever.  You have redness, swelling, or yellow drainage around your insertion site. Get help right away if:  You have unusual pain at the radial site.  The catheter insertion area swells very fast.  The insertion area is bleeding, and the bleeding does not stop when you hold steady pressure on the area.  Your arm or hand becomes pale, cool, tingly, or numb. These symptoms may represent a serious problem that is an emergency. Hunter Mcmillan not wait to see if the symptoms will go away. Get medical help right away. Call your local emergency services (911 in the [place] Beula Joyner not drive yourself to the hospital. Summary  After the procedure, it is common to have bruising and tenderness at the site.  Follow instructions from your health care provider about how to take care of your radial site wound. Check the wound every day for signs of infection.  Neil Errickson not lift anything that is heavier than 10 lb (4.5 kg), or the limit that you are told, until your health care provider says that it is safe. This information is not intended to replace advice given to you by your health care provider. Make sure you discuss any questions you have with your health care provider. Document Revised: 03/25/2017 Document Reviewed: 03/25/2017 Elsevier Patient Education  2020 [employer] Femoral Site Care This sheet gives you information about how to care for yourself after your procedure. Your health care provider may also give you more specific instructions. If you have problems or questions, contact your health care provider. What can I expect after the procedure? After the procedure, it is  common to have:  Bruising that usually fades within 1-2 weeks.  Tenderness at the site. Follow these instructions at home: Wound care  Follow instructions from your health care provider about how to take care of your insertion site. Make sure you: ? Wash your hands with soap and water before you change your bandage (dressing). If soap and water are not available, use hand sanitizer. ? Change your dressing as told by your health care provider. ? Leave stitches (sutures), skin glue, or adhesive strips in place. These skin closures may need to stay in place for 2 weeks or longer. If adhesive strip edges start to loosen and curl up, you may trim the loose edges. Peggy Loge not remove adhesive strips completely unless your health care provider tells you  to Ronisha Herringshaw that.  Amias Hutchinson not take baths, swim, or use a hot tub until your health care provider approves.  You may shower 24-48 hours after the procedure or as told by your health care provider. ? Gently wash the site with plain soap and water. ? Pat the area dry with a clean towel. ? Pilar Corrales not rub the site. This may cause bleeding.  Kyng Matlock not apply powder or lotion to the site. Keep the site clean and dry.  Check your femoral site every day for signs of infection. Check for: ? Redness, swelling, or pain. ? Fluid or blood. ? Warmth. ? Pus or a bad smell. Activity  For the first 2-3 days after your procedure, or as long as directed: ? Avoid climbing stairs as much as possible. ? Arrin Pintor not squat.  Suleima Ohlendorf not lift anything that is heavier than 10 lb (4.5 kg), or the limit that you are told, until your health care provider says that it is safe.  Rest as directed. ? Avoid sitting for a long time without moving. Get up to take short walks every 1-2 hours.  Daelynn Blower not drive for 24 hours if you were given a medicine to help you relax (sedative). General instructions  Take over-the-counter and prescription medicines only as told by your health care provider.  Keep all  follow-up visits as told by your health care provider. This is important. Contact a health care provider if you have:  A fever or chills.  You have redness, swelling, or pain around your insertion site. Get help right away if:  The catheter insertion area swells very fast.  You pass out.  You suddenly start to sweat or your skin gets clammy.  The catheter insertion area is bleeding, and the bleeding does not stop when you hold steady pressure on the area.  The area near or just beyond the catheter insertion site becomes pale, cool, tingly, or numb. These symptoms may represent a serious problem that is an emergency. Mylon Mabey not wait to see if the symptoms will go away. Get medical help right away. Call your local emergency services (911 in the [place] Chassie Pennix not drive yourself to the hospital. Summary  After the procedure, it is common to have bruising that usually fades within 1-2 weeks.  Check your femoral site every day for signs of infection.  Bob Daversa not lift anything that is heavier than 10 lb (4.5 kg), or the limit that you are told, until your health care provider says that it is safe. This information is not intended to replace advice given to you by your health care provider. Make sure you discuss any questions you have with your health care provider. Document Revised: 03/02/2017 Document Reviewed: 03/02/2017 Elsevier Patient Education  2020 [employer].

## 2019-04-26 NOTE — Progress Notes (Signed)
Pt's stable, dc home via wheelchair with TOC meds.  

## 2019-04-26 NOTE — Discharge Summary (Signed)
Discharge Summary    Patient ID: Hunter Mcmillan MRN: [id number]; DOB: 1950/12/25  Admit date: 04/24/2019 Discharge date: 04/26/2019  Primary Care Provider: Pearline Cables, MD  Primary Cardiologist: Peter [place], MD   Discharge Diagnoses    Principal Problem:   Acute inferior STEMI Active Problems:   Tobacco abuse   Coronary artery disease involving native heart with unstable angina pectoris [facility])   Essential hypertension   STEMI involving right coronary artery (HCC)   Presence of drug-eluting stent in right coronary artery   Hyperlipidemia with target LDL less than 70   COPD  Diagnostic Studies/Procedures    Echo 04/24/19 1. Left ventricular ejection fraction, by estimation, is 40 to 45%. The  left ventricle has mildly decreased function. The left ventricle demonstrates regional wall motion abnormalities (see scoring diagram/findings for description). There is mild left ventricular hypertrophy. Left ventricular diastolic parameters are consistent with Grade I diastolic dysfunction (impaired relaxation).  2. Right ventricular systolic function is severely reduced. The right  ventricular size is mildly enlarged. There is mildly elevated pulmonary  artery systolic pressure.  3. The mitral valve is grossly normal. Trivial mitral valve egurgitation.  4. Tricuspid valve regurgitation is mild to moderate.  5. The aortic valve has an indeterminant number of cusps. Aortic valve regurgitation is not visualized.  6. The inferior vena cava is dilated in size with <50% respiratory variability, suggesting right atrial pressure of 15 mmHg.   CORONARY ANGIOGRAPHY 04/24/2019  Conclusion    Prox Cx lesion is 90% stenosed.  Previously placed Prox RCA to Mid RCA stent (unknown type) is widely patent.   1. Continued excellent patency of the RCA stents. The LCx stenosis is chronic and this is a small vessel.   Plan: continue medical management. Will remove femoral sheath manually.  Will add colchicine for possible pericardial component to pain. Prn analgesics.    Coronary/Graft Acute MI Revascularization  04/24/2019  LEFT HEART CATH AND CORONARY ANGIOGRAPHY  Conclusion    Ost LM lesion is 25% stenosed.  Mid LAD lesion is 25% stenosed.  Prox Cx lesion is 90% stenosed.  Ost RCA to Mid RCA lesion is 100% stenosed.  A drug-eluting stent was successfully placed using a STENT RESOLUTE ONYX 3.5X38.  A drug-eluting stent was successfully placed using a STENT RESOLUTE ONYX 3.5X30.  Post intervention, there is a 0% residual stenosis.  LV end diastolic pressure is normal.   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 Severe 2 vessel CAD    - culprit lesion is 100% occlusion of the proximal RCA with heavy thrombus    - 90% small LCx 3. Normal LVEDP 4. Successful PCI of the RCA with thrombectomy and stenting with DES x 2.  Plan: DAPT for at least one year. IV Aggrastat for 18 hours. Start beta blocker and high dose statin. Assess LV function by Echo.    Diagnostic Dominance: Right  Intervention      History of Present Illness     Hunter Mcmillan is a 69 y.o. male with hx of CAD, ETOH abuse, HVC/ETOH hepatitis, HTN, tobacco smoking and COPD presented with acute severe chest pain and ST elevation inferiorly.  Hx of NSTEMI 01/2012.  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  This was a small caliber vessel with 95% stenosis that was the likely culprit for NSTEMI. Fiven the small caliber, it was decided to manage him medically.  EF was preserved on echo. No follow up since 02/2012.  Presented  04/24/19 with acute onset SSCP and SOB. It woke him up from sleep. Was evaluated for gallbladder disease last fall and surgery recommended but he has not had this done yet. Also history of lumbar disc disease. Currently smoking 2 cigs/day. EKG showed Inferior ST elevation that is new since October with small Q waves. Given ASA and IV heparin in ED. Taken to cath lab.    Hospital Course     Consultants: None  Emergent cath showed severe two-vessel CAD: 100% occlusion of proximal RCA, with heavy thrombosis and 90% small LCx stenosis.  Successful PCI of the RCA with thrombectomy and stenting with DES x2 performed.  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 patient had a second heart cath in about 2 hours after first cardiac cath due to developing severe substernal chest pain that was not improved with nitroglycerin or IV morphine.  Second cardiac cath performed through right femoral artery. It did not show any new changes.  Recommended to continue medical management and colchicine added for possible pericardial component to pain. Echo showed LVEF of 40-45%,  LV Wm abnormality and grade 1DD. No heart failure symptoms. DAPT with ASA and Brillinta. Added BB and high intensity statin. BP soft to add ACE/ARB>> consider as outpatient. He is interested in  CRP II.  Will give one week supply of colchicine for possible pericarditis >> reevaluate in clinic.   Patient reported SOB with ambulation with cardiac rehab without chest pain. He has SOB for long time. He denies orthopnea, PND or lower extremity edema.  His shortness of breath is chronic and possibly due to untreated COPD and ongoing tobacco smoking.  He was previously placed on inhaler for COPD but could not afford due to lack of insurance.  Now he has insurance, he was given albuterol  and fluticasone inhaler.  Advised to follow-up with PCP closely.  Strongly recommended smoking cessation.   Did the patient have an acute coronary syndrome (MI, NSTEMI, STEMI, etc) this admission?:  Yes                               AHA/ACC Clinical Performance & Quality Measures: 1. Aspirin prescribed? - Yes 2. ADP Receptor Inhibitor (Plavix/Clopidogrel, Brilinta/Ticagrelor or Effient/Prasugrel) prescribed (includes medically managed patients)? - Yes 3. Beta Blocker prescribed? -  Yes 4. High Intensity Statin (Lipitor 40-80mg  or Crestor 20-40mg ) prescribed? - Yes 5. EF assessed during THIS hospitalization? - Yes 6. For EF <40%, was ACEI/ARB prescribed? - No - Reason:  Plan to add as outpatient due to soft blood pressure 7. For EF <40%, Aldosterone Antagonist (Spironolactone or Eplerenone) prescribed? - Not Applicable (EF >/= 40%) 8. Cardiac Rehab Phase II ordered (Included Medically managed Patients)? - Yes   Discharge Vitals Blood pressure 103/69, pulse 80, temperature 98.2 F (36.8 C), temperature source Oral, resp. rate 20, height 5\' 11"  (1.803 m), weight 67.4 kg, SpO2 92 %.  Filed Weights   04/24/19 0806 04/26/19 0433  Weight: 68 kg 67.4 kg   .Physical Exam  Constitutional: He is oriented to person, place, and time and well-developed, well-nourished, and in no distress.  HENT:  Head: Normocephalic and atraumatic.  Eyes: Pupils are equal, round, and reactive to light. EOM are normal.  Cardiovascular: Normal rate and regular rhythm.  Radial cath site stable  Pulmonary/Chest: Effort normal.  Faint bibasilar crackles  Abdominal: Soft. Bowel sounds are normal.  Musculoskeletal:  General: Normal range of motion.     Cervical back: Normal range of motion and neck supple.  Neurological: He is alert and oriented to person, place, and time.  Skin: Skin is warm and dry.  Psychiatric: Affect normal.   Labs & Radiologic Studies    CBC Recent Labs    04/24/19 0758 04/24/19 0758 04/24/19 0902 04/25/19 0212  WBC 10.2  --   --  9.5  NEUTROABS 8.2*  --   --   --   HGB 13.3   < > 13.3 13.0  HCT 40.3   < > 39.0 40.3  MCV 100.2*  --   --  105.8*  PLT 112*  --   --  114*   < > = values in this interval not displayed.   Basic Metabolic Panel Recent Labs    04/25/19 0212 04/26/19 0314  NA 131* 134*  K 5.0 3.7  CL 100 101  CO2 18* 23  GLUCOSE 122* 105*  BUN 23 22  CREATININE 1.13 1.12  CALCIUM 8.8* 8.4*  MG 1.8  --    Liver Function  Tests Recent Labs    04/24/19 0758  AST 171*  ALT 124*  ALKPHOS 87  BILITOT 1.5*  PROT 7.1  ALBUMIN 3.7   High Sensitivity Troponin:   Recent Labs  Lab 04/24/19 0758 04/24/19 1100  TROPONINIHS 11,516* 18,332*    Hemoglobin A1C Recent Labs    04/24/19 0758  HGBA1C 5.8*   Fasting Lipid Panel Recent Labs    04/26/19 0314  CHOL 94  HDL 16*  LDLCALC 65  TRIG 63  CHOLHDL 5.9  _____________  CARDIAC CATHETERIZATION  Result Date: 04/24/2019  Prox Cx lesion is 90% stenosed.  Previously placed Prox RCA to Mid RCA stent (unknown type) is widely patent.  1. Continued excellent patency of the RCA stents. The LCx stenosis is chronic and this is a small vessel. Plan: continue medical management. Will remove femoral sheath manually. Will add colchicine for possible pericardial component to pain. Prn analgesics.   CARDIAC CATHETERIZATION  Result Date: 04/24/2019  Colon Flattery LM lesion is 25% stenosed.  Mid LAD lesion is 25% stenosed.  Prox Cx lesion is 90% stenosed.  Ost RCA to Mid RCA lesion is 100% stenosed.  A drug-eluting stent was successfully placed using a STENT RESOLUTE ONYX 3.5X38.  A drug-eluting stent was successfully placed using a STENT RESOLUTE ONYX 3.5X30.  Post intervention, there is a 0% residual stenosis.  LV end diastolic pressure is normal.  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 Severe 2 vessel CAD    - culprit lesion is 100% occlusion of the proximal RCA with heavy thrombus    - 90% small LCx 3. Normal LVEDP 4. Successful PCI of the RCA with thrombectomy and stenting with DES x 2. Plan: DAPT for at least one year. IV Aggrastat for 18 hours. Start beta blocker and high dose statin. Assess LV function by Echo.   DG Chest Port 1 View  Result Date: 04/24/2019 CLINICAL DATA:  Chest pain EXAM: PORTABLE CHEST 1 VIEW COMPARISON:  07/30/2017 chest radiograph. FINDINGS: In Stable cardiomediastinal silhouette with normal heart size. No pneumothorax. No pleural  effusion. Lungs appear clear, with no acute consolidative airspace disease and no pulmonary edema. IMPRESSION: No active disease. Electronically Signed   By: Ilona Sorrel M.D.   On: 04/24/2019 08:24   ECHOCARDIOGRAM COMPLETE  Result Date: 04/24/2019    ECHOCARDIOGRAM REPORT   Patient Name:  Hunter Mcmillan Date of Exam: 04/24/2019 Medical Rec #:  [id number]        Height:       71.0 in Accession #:    [id number]       Weight:       150.0 lb Date of Birth:  1950/06/14        BSA:          1.866 m Patient Age:    68 years         BP:           110/63 mmHg Patient Gender: M                HR:           65 bpm. Exam Location:  Inpatient Procedure: 2D Echo, Cardiac Doppler and Color Doppler Indications:    CAD  History:        Patient has prior history of Echocardiogram examinations, most                 recent 01/06/2012.  Sonographer:    Roosvelt Maser RDCS Referring Phys: [phone] PETER M [place] IMPRESSIONS  1. Left ventricular ejection fraction, by estimation, is 40 to 45%. The left ventricle has mildly decreased function. The left ventricle demonstrates regional wall motion abnormalities (see scoring diagram/findings for description). There is mild left ventricular hypertrophy. Left ventricular diastolic parameters are consistent with Grade I diastolic dysfunction (impaired relaxation).  2. Right ventricular systolic function is severely reduced. The right ventricular size is mildly enlarged. There is mildly elevated pulmonary artery systolic pressure.  3. The mitral valve is grossly normal. Trivial mitral valve regurgitation.  4. Tricuspid valve regurgitation is mild to moderate.  5. The aortic valve has an indeterminant number of cusps. Aortic valve regurgitation is not visualized.  6. The inferior vena cava is dilated in size with <50% respiratory variability, suggesting right atrial pressure of 15 mmHg. FINDINGS  Left Ventricle: Left ventricular ejection fraction, by estimation, is 40 to 45%. The left ventricle has  mildly decreased function. The left ventricle demonstrates regional wall motion abnormalities. The left ventricular internal cavity size was normal in size. There is mild left ventricular hypertrophy. Left ventricular diastolic parameters are consistent with Grade I diastolic dysfunction (impaired relaxation).  LV Wall Scoring: The entire inferior wall, basal inferolateral segment, and basal anterolateral segment are hypokinetic. The entire anterior wall, mid and distal lateral wall, entire septum, mid anterolateral segment, and apex are normal. Right Ventricle: The right ventricular size is mildly enlarged. No increase in right ventricular wall thickness. Right ventricular systolic function is severely reduced. There is mildly elevated pulmonary artery systolic pressure. The tricuspid regurgitant velocity is 2.09 m/s, and with an assumed right atrial pressure of 15 mmHg, the estimated right ventricular systolic pressure is 32.5 mmHg. Left Atrium: Left atrial size was normal in size. Right Atrium: Right atrial size was normal in size. Pericardium: There is no evidence of pericardial effusion. Mitral Valve: The mitral valve is grossly normal. Trivial mitral valve regurgitation. Tricuspid Valve: The tricuspid valve is grossly normal. Tricuspid valve regurgitation is mild to moderate. Aortic Valve: The aortic valve has an indeterminant number of cusps. Aortic valve regurgitation is not visualized. There is mild calcification of the aortic valve. Pulmonic Valve: The pulmonic valve was not well visualized. Pulmonic valve regurgitation is not visualized. Aorta: The aortic root is normal in size and structure. Venous: The inferior vena cava is dilated in size with less than 50% respiratory  variability, suggesting right atrial pressure of 15 mmHg. IAS/Shunts: No atrial level shunt detected by color flow Doppler.  LEFT VENTRICLE PLAX 2D LVIDd:         3.40 cm     Diastology LVIDs:         2.40 cm     LV e' lateral:   8.38  cm/s LV PW:         1.30 cm     LV E/e' lateral: 5.1 LV IVS:        1.10 cm     LV e' medial:    5.18 cm/s LVOT diam:     2.00 cm     LV E/e' medial:  8.3 LV SV:         41.47 ml LV SV Index:   22.22 LVOT Area:     3.14 cm  LV Volumes (MOD) LV vol d, MOD A2C: 55.3 ml LV vol d, MOD A4C: 77.5 ml LV vol s, MOD A2C: 35.2 ml LV vol s, MOD A4C: 41.2 ml LV SV MOD A2C:     20.1 ml LV SV MOD A4C:     77.5 ml LV SV MOD BP:      26.9 ml RIGHT VENTRICLE            IVC RV Basal diam:  4.60 cm    IVC diam: 2.20 cm RV Mid diam:    4.20 cm RV S prime:     5.63 cm/s TAPSE (M-mode): 0.7 cm LEFT ATRIUM             Index       RIGHT ATRIUM           Index LA diam:        3.10 cm 1.66 cm/m  RA Area:     14.20 cm LA Vol (A2C):   52.0 ml 27.87 ml/m RA Volume:   37.80 ml  20.26 ml/m LA Vol (A4C):   41.6 ml 22.29 ml/m LA Biplane Vol: 50.5 ml 27.06 ml/m  AORTIC VALVE LVOT Vmax:   79.00 cm/s LVOT Vmean:  44.700 cm/s LVOT VTI:    0.132 m  AORTA Ao Root diam: 2.90 cm MITRAL VALVE               TRICUSPID VALVE MV Area (PHT): 2.32 cm    TR Peak grad:   17.5 mmHg MV Decel Time: 327 msec    TR Vmax:        209.00 cm/s MV E velocity: 42.80 cm/s MV A velocity: 58.70 cm/s  SHUNTS MV E/A ratio:  0.73        Systemic VTI:  0.13 m                            Systemic Diam: 2.00 cm Nona Dell MD Electronically signed by Nona Dell MD Signature Date/Time: 04/24/2019/5:13:12 PM    Final    Disposition   Pt is being discharged home today in good condition.  Follow-up Plans & Appointments    Follow-up Information    Jodelle Gross, NP. Go on 05/02/2019.   Specialties: Nurse Practitioner, Radiology, Cardiology Why: :15pm for hosptial follow up with Dr. Elvis Coil PA/NP Contact information: [address] [place] [address] [phone]          Discharge Instructions    Amb Referral to Cardiac Rehabilitation   Complete by: As directed    Diagnosis:  Coronary  Stents STEMI     After initial evaluation  and assessments completed: Virtual Based Care may be provided alone or in conjunction with Phase 2 Cardiac Rehab based on patient barriers.: Yes   Diet - low sodium heart healthy   Complete by: As directed    Discharge instructions   Complete by: As directed    No driving for 2 weeks. No lifting over 10 lbs for 4 weeks. No sexual activity for 4 weeks. You may not return to work until cleared by your cardiologist.  Keep procedure site clean & dry. If you notice increased pain, swelling, bleeding or pus, call/return!  You may shower, but no soaking baths/hot tubs/pools for 1 week.   Increase activity slowly   Complete by: As directed       Discharge Medications   Allergies as of 04/26/2019   No Known Allergies     Medication List    TAKE these medications   albuterol 108 (90 Base) MCG/ACT inhaler Commonly known as: VENTOLIN HFA Inhale 2 puffs into the lungs every 6 (six) hours as needed for wheezing or shortness of breath.   aspirin EC 81 MG tablet Take 81 mg by mouth daily.   atorvastatin 80 MG tablet Commonly known as: LIPITOR Take 1 tablet (80 mg total) by mouth daily at 6 PM.   colchicine 0.6 MG tablet Take 1 tablet (0.6 mg total) by mouth daily. Start taking on: April 27, 2019   cyclobenzaprine 10 MG tablet Commonly known as: FLEXERIL TAKE 1 TABLET BY MOUTH TWICE DAILY AS NEEDED FOR MUSCLE SPASM What changed: See the new instructions.   fluticasone 110 MCG/ACT inhaler Commonly known as: FLOVENT HFA Inhale 2 puffs into the lungs 2 (two) times daily.   metoprolol succinate 25 MG 24 hr tablet Commonly known as: TOPROL-XL Take 1 tablet (25 mg total) by mouth daily.   multivitamin with minerals Tabs tablet Take 1 tablet by mouth daily.   nitroGLYCERIN 0.4 MG SL tablet Commonly known as: NITROSTAT Place 1 tablet (0.4 mg total) under the tongue every 5 (five) minutes as needed for chest pain.   ticagrelor 90 MG Tabs tablet Commonly known as: BRILINTA Take 1  tablet (90 mg total) by mouth 2 (two) times daily.          Outstanding Labs/Studies   Consider OP f/u labs 6-8 weeks given statin initiation this admission.  Duration of Discharge Encounter   Greater than 30 minutes including physician time.  Lorelei Pont, PA 04/26/2019, 10:44 AM

## 2019-04-26 NOTE — Telephone Encounter (Signed)
Patient is currently admitted

## 2019-04-26 NOTE — Progress Notes (Signed)
CARDIAC REHAB PHASE I   PRE:  Rate/Rhythm: 87 SR  BP:  Sitting: 99/58      SaO2: 92 RA  MODE:  Ambulation: 470 ft   POST:  Rate/Rhythm: 109 ST  BP:  Sitting: 103/60    SaO2: 95 RA  Pt ambulated 431ft in hallway standby assist with steady gait. Pt took one long standing rest break c/o SOB. Pt states SOB unchanged from prior to procedure from what he can tell. Pt states he had an inhaler in the past but cannot afford it. Pt given smoking cessation tip sheet, states hes recently cut back from a whole pack a day to 2 cigarettes per day. Reviewed exercise guidelines. Pt referred to [facility] [place].  [id number] Reynold Bowen, RN BSN 04/26/2019 8:44 AM

## 2019-04-26 NOTE — Telephone Encounter (Signed)
  Patient has TOC appt with Joni Reining on 05/02/19 @ 2:15 pm per [facility]

## 2019-04-26 NOTE — Care Management (Signed)
9211 04-26-19 Benefits check submitted for ticagrelor (BRILINTA) tablet 90 mg : Dose 90 mg : Oral : 2 times daily  Graves-Bigelow, Lamar Laundry, RN, BSN

## 2019-04-28 NOTE — Telephone Encounter (Signed)
Patient discharged 04/26/19  TOC - call attempt #1 - no answer, phone did not ring, left voicemail

## 2019-04-30 ENCOUNTER — Encounter (HOSPITAL_COMMUNITY): Payer: Self-pay | Admitting: Emergency Medicine

## 2019-04-30 ENCOUNTER — Emergency Department (HOSPITAL_COMMUNITY): Payer: Medicare Other

## 2019-04-30 ENCOUNTER — Other Ambulatory Visit: Payer: Self-pay

## 2019-04-30 ENCOUNTER — Inpatient Hospital Stay (HOSPITAL_COMMUNITY)
Admission: EM | Admit: 2019-04-30 | Discharge: 2019-05-03 | DRG: 444 | Disposition: A | Discharge status: Home / Self Care | Payer: Medicare Other | Attending: Internal Medicine | Admitting: Internal Medicine

## 2019-04-30 DIAGNOSIS — R7989 Other specified abnormal findings of blood chemistry: Secondary | ICD-10-CM

## 2019-04-30 DIAGNOSIS — Z7982 Long term (current) use of aspirin: Secondary | ICD-10-CM | POA: Diagnosis not present

## 2019-04-30 DIAGNOSIS — R778 Other specified abnormalities of plasma proteins: Secondary | ICD-10-CM | POA: Diagnosis not present

## 2019-04-30 DIAGNOSIS — J449 Chronic obstructive pulmonary disease, unspecified: Secondary | ICD-10-CM | POA: Diagnosis not present

## 2019-04-30 DIAGNOSIS — I213 ST elevation (STEMI) myocardial infarction of unspecified site: Secondary | ICD-10-CM | POA: Diagnosis not present

## 2019-04-30 DIAGNOSIS — Z743 Need for continuous supervision: Secondary | ICD-10-CM | POA: Diagnosis not present

## 2019-04-30 DIAGNOSIS — R7401 Elevation of levels of liver transaminase levels: Secondary | ICD-10-CM | POA: Diagnosis not present

## 2019-04-30 DIAGNOSIS — D696 Thrombocytopenia, unspecified: Secondary | ICD-10-CM | POA: Diagnosis present

## 2019-04-30 DIAGNOSIS — I2119 ST elevation (STEMI) myocardial infarction involving other coronary artery of inferior wall: Secondary | ICD-10-CM | POA: Diagnosis not present

## 2019-04-30 DIAGNOSIS — D649 Anemia, unspecified: Secondary | ICD-10-CM | POA: Diagnosis present

## 2019-04-30 DIAGNOSIS — R000 Tachycardia, unspecified: Secondary | ICD-10-CM | POA: Diagnosis not present

## 2019-04-30 DIAGNOSIS — I5022 Chronic systolic (congestive) heart failure: Secondary | ICD-10-CM | POA: Diagnosis not present

## 2019-04-30 DIAGNOSIS — Z7902 Long term (current) use of antithrombotics/antiplatelets: Secondary | ICD-10-CM

## 2019-04-30 DIAGNOSIS — E785 Hyperlipidemia, unspecified: Secondary | ICD-10-CM | POA: Diagnosis not present

## 2019-04-30 DIAGNOSIS — I252 Old myocardial infarction: Secondary | ICD-10-CM | POA: Diagnosis not present

## 2019-04-30 DIAGNOSIS — R0789 Other chest pain: Secondary | ICD-10-CM | POA: Diagnosis not present

## 2019-04-30 DIAGNOSIS — Z20822 Contact with and (suspected) exposure to covid-19: Secondary | ICD-10-CM | POA: Diagnosis present

## 2019-04-30 DIAGNOSIS — R0902 Hypoxemia: Secondary | ICD-10-CM | POA: Diagnosis not present

## 2019-04-30 DIAGNOSIS — F17210 Nicotine dependence, cigarettes, uncomplicated: Secondary | ICD-10-CM | POA: Diagnosis present

## 2019-04-30 DIAGNOSIS — R1084 Generalized abdominal pain: Secondary | ICD-10-CM | POA: Diagnosis not present

## 2019-04-30 DIAGNOSIS — K8020 Calculus of gallbladder without cholecystitis without obstruction: Principal | ICD-10-CM

## 2019-04-30 DIAGNOSIS — K760 Fatty (change of) liver, not elsewhere classified: Secondary | ICD-10-CM | POA: Diagnosis not present

## 2019-04-30 DIAGNOSIS — R945 Abnormal results of liver function studies: Secondary | ICD-10-CM | POA: Diagnosis not present

## 2019-04-30 DIAGNOSIS — I110 Hypertensive heart disease with heart failure: Secondary | ICD-10-CM | POA: Diagnosis not present

## 2019-04-30 DIAGNOSIS — K7460 Unspecified cirrhosis of liver: Secondary | ICD-10-CM | POA: Diagnosis not present

## 2019-04-30 DIAGNOSIS — I2510 Atherosclerotic heart disease of native coronary artery without angina pectoris: Secondary | ICD-10-CM | POA: Diagnosis present

## 2019-04-30 DIAGNOSIS — Z955 Presence of coronary angioplasty implant and graft: Secondary | ICD-10-CM

## 2019-04-30 DIAGNOSIS — R079 Chest pain, unspecified: Secondary | ICD-10-CM | POA: Diagnosis not present

## 2019-04-30 DIAGNOSIS — Z79899 Other long term (current) drug therapy: Secondary | ICD-10-CM

## 2019-04-30 DIAGNOSIS — R748 Abnormal levels of other serum enzymes: Secondary | ICD-10-CM | POA: Diagnosis not present

## 2019-04-30 DIAGNOSIS — Z8249 Family history of ischemic heart disease and other diseases of the circulatory system: Secondary | ICD-10-CM

## 2019-04-30 DIAGNOSIS — R935 Abnormal findings on diagnostic imaging of other abdominal regions, including retroperitoneum: Secondary | ICD-10-CM | POA: Diagnosis not present

## 2019-04-30 DIAGNOSIS — R109 Unspecified abdominal pain: Secondary | ICD-10-CM | POA: Diagnosis not present

## 2019-04-30 DIAGNOSIS — B182 Chronic viral hepatitis C: Secondary | ICD-10-CM | POA: Diagnosis present

## 2019-04-30 DIAGNOSIS — Z8673 Personal history of transient ischemic attack (TIA), and cerebral infarction without residual deficits: Secondary | ICD-10-CM

## 2019-04-30 DIAGNOSIS — R1011 Right upper quadrant pain: Secondary | ICD-10-CM

## 2019-04-30 LAB — URINALYSIS, ROUTINE W REFLEX MICROSCOPIC
Bilirubin Urine: NEGATIVE
Glucose, UA: NEGATIVE mg/dL
Hgb urine dipstick: NEGATIVE
Ketones, ur: NEGATIVE mg/dL
Leukocytes,Ua: NEGATIVE
Nitrite: NEGATIVE
Protein, ur: NEGATIVE mg/dL
Specific Gravity, Urine: 1.014 (ref 1.005–1.030)
pH: 6 (ref 5.0–8.0)

## 2019-04-30 LAB — SARS CORONAVIRUS 2 (TAT 6-24 HRS): SARS Coronavirus 2: NEGATIVE

## 2019-04-30 LAB — CBC WITH DIFFERENTIAL/PLATELET
Abs Immature Granulocytes: 0.03 10*3/uL (ref 0.00–0.07)
Basophils Absolute: 0 10*3/uL (ref 0.0–0.1)
Basophils Relative: 0 %
Eosinophils Absolute: 0 10*3/uL (ref 0.0–0.5)
Eosinophils Relative: 0 %
HCT: 43 % (ref 39.0–52.0)
Hemoglobin: 14.4 g/dL (ref 13.0–17.0)
Immature Granulocytes: 0 %
Lymphocytes Relative: 5 %
Lymphs Abs: 0.6 10*3/uL — ABNORMAL LOW (ref 0.7–4.0)
MCH: 33 pg (ref 26.0–34.0)
MCHC: 33.5 g/dL (ref 30.0–36.0)
MCV: 98.6 fL (ref 80.0–100.0)
Monocytes Absolute: 0.2 10*3/uL (ref 0.1–1.0)
Monocytes Relative: 2 %
Neutro Abs: 10.2 10*3/uL — ABNORMAL HIGH (ref 1.7–7.7)
Neutrophils Relative %: 93 %
Platelets: 181 10*3/uL (ref 150–400)
RBC: 4.36 MIL/uL (ref 4.22–5.81)
RDW: 13.1 % (ref 11.5–15.5)
WBC: 11.1 10*3/uL — ABNORMAL HIGH (ref 4.0–10.5)
nRBC: 0 % (ref 0.0–0.2)

## 2019-04-30 LAB — COMPREHENSIVE METABOLIC PANEL
ALT: 364 U/L — ABNORMAL HIGH (ref 0–44)
AST: 232 U/L — ABNORMAL HIGH (ref 15–41)
Albumin: 3.3 g/dL — ABNORMAL LOW (ref 3.5–5.0)
Alkaline Phosphatase: 200 U/L — ABNORMAL HIGH (ref 38–126)
Anion gap: 11 (ref 5–15)
BUN: 16 mg/dL (ref 8–23)
CO2: 25 mmol/L (ref 22–32)
Calcium: 8.9 mg/dL (ref 8.9–10.3)
Chloride: 101 mmol/L (ref 98–111)
Creatinine, Ser: 1.23 mg/dL (ref 0.61–1.24)
GFR calc Af Amer: >60 mL/min (ref >60)
GFR calc non Af Amer: 60 mL/min — ABNORMAL LOW (ref >60)
Glucose, Bld: 141 mg/dL — ABNORMAL HIGH (ref 70–99)
Potassium: 4 mmol/L (ref 3.5–5.1)
Sodium: 137 mmol/L (ref 135–145)
Total Bilirubin: 1.9 mg/dL — ABNORMAL HIGH (ref 0.3–1.2)
Total Protein: 6.9 g/dL (ref 6.5–8.1)

## 2019-04-30 LAB — LACTIC ACID, PLASMA
Lactic Acid, Venous: 1.2 mmol/L (ref 0.5–1.9)
Lactic Acid, Venous: 1.6 mmol/L (ref 0.5–1.9)

## 2019-04-30 LAB — LIPASE, BLOOD: Lipase: 26 U/L (ref 11–51)

## 2019-04-30 LAB — TROPONIN I (HIGH SENSITIVITY)
Troponin I (High Sensitivity): 1335 ng/L (ref <18)
Troponin I (High Sensitivity): 1043 ng/L (ref <18)

## 2019-04-30 MED ORDER — HYDROMORPHONE HCL 1 MG/ML IJ SOLN: 0.5000 mg | INTRAMUSCULAR | Status: DC | PRN

## 2019-04-30 MED ORDER — ONDANSETRON HCL 4 MG/2ML IJ SOLN
4.0000 mg | Freq: Once | INTRAMUSCULAR | Status: AC
  Administered 2019-04-30: 4 mg via INTRAVENOUS
  Filled 2019-04-30: qty 2

## 2019-04-30 MED ORDER — HYDROMORPHONE HCL 1 MG/ML IJ SOLN
1.0000 mg | Freq: Once | INTRAMUSCULAR | Status: AC
  Administered 2019-04-30: 1 mg via INTRAVENOUS
  Filled 2019-04-30: qty 1

## 2019-04-30 MED ORDER — BUDESONIDE 0.25 MG/2ML IN SUSP
0.2500 mg | Freq: Two times a day (BID) | RESPIRATORY_TRACT | Status: DC
  Administered 2019-04-30 – 2019-05-03 (×6): 0.25 mg via RESPIRATORY_TRACT
  Filled 2019-04-30 (×7): qty 2

## 2019-04-30 MED ORDER — ONDANSETRON HCL 4 MG/2ML IJ SOLN: 4.0000 mg | Freq: Four times a day (QID) | INTRAMUSCULAR | Status: DC | PRN

## 2019-04-30 MED ORDER — HEPARIN SODIUM (PORCINE) 5000 UNIT/ML IJ SOLN
5000.0000 [IU] | Freq: Three times a day (TID) | INTRAMUSCULAR | Status: DC
  Administered 2019-04-30 – 2019-05-03 (×9): 5000 [IU] via SUBCUTANEOUS
  Filled 2019-04-30 (×10): qty 1

## 2019-04-30 MED ORDER — COLCHICINE 0.6 MG PO TABS
0.6000 mg | ORAL_TABLET | Freq: Every day | ORAL | Status: DC
  Administered 2019-05-01 – 2019-05-03 (×3): 0.6 mg via ORAL
  Filled 2019-04-30 (×4): qty 1

## 2019-04-30 MED ORDER — ADULT MULTIVITAMIN W/MINERALS CH
1.0000 | ORAL_TABLET | Freq: Every day | ORAL | Status: DC
  Administered 2019-04-30 – 2019-05-03 (×4): 1 via ORAL
  Filled 2019-04-30 (×4): qty 1

## 2019-04-30 MED ORDER — TECHNETIUM TC 99M MEBROFENIN IV KIT
5.4500 | PACK | Freq: Once | INTRAVENOUS | Status: AC | PRN
  Administered 2019-04-30: 10:00:00 5.45 via INTRAVENOUS

## 2019-04-30 MED ORDER — OXYCODONE HCL 5 MG PO TABS: 5.0000 mg | ORAL_TABLET | ORAL | Status: DC | PRN

## 2019-04-30 MED ORDER — CYCLOBENZAPRINE HCL 10 MG PO TABS: 10.0000 mg | ORAL_TABLET | Freq: Every day | ORAL | Status: DC | PRN

## 2019-04-30 MED ORDER — ALBUTEROL SULFATE (2.5 MG/3ML) 0.083% IN NEBU: 2.5000 mg | INHALATION_SOLUTION | Freq: Four times a day (QID) | RESPIRATORY_TRACT | Status: DC | PRN

## 2019-04-30 MED ORDER — ASPIRIN EC 81 MG PO TBEC
81.0000 mg | DELAYED_RELEASE_TABLET | Freq: Every day | ORAL | Status: DC
  Administered 2019-05-01 – 2019-05-03 (×3): 81 mg via ORAL
  Filled 2019-04-30 (×4): qty 1

## 2019-04-30 MED ORDER — TICAGRELOR 90 MG PO TABS
90.0000 mg | ORAL_TABLET | Freq: Two times a day (BID) | ORAL | Status: DC
  Administered 2019-04-30 – 2019-05-03 (×6): 90 mg via ORAL
  Filled 2019-04-30 (×8): qty 1

## 2019-04-30 MED ORDER — METOPROLOL SUCCINATE ER 25 MG PO TB24
25.0000 mg | ORAL_TABLET | Freq: Every day | ORAL | Status: DC
  Administered 2019-05-01 – 2019-05-03 (×3): 25 mg via ORAL
  Filled 2019-04-30 (×4): qty 1

## 2019-04-30 MED ORDER — FLUTICASONE PROPIONATE HFA 110 MCG/ACT IN AERO: 2.0000 | INHALATION_SPRAY | Freq: Two times a day (BID) | RESPIRATORY_TRACT | Status: DC

## 2019-04-30 MED ORDER — ONDANSETRON HCL 4 MG PO TABS: 4.0000 mg | ORAL_TABLET | Freq: Four times a day (QID) | ORAL | Status: DC | PRN

## 2019-04-30 NOTE — ED Notes (Signed)
Pt unable to void at this time urinal at bedside 

## 2019-04-30 NOTE — Plan of Care (Signed)

## 2019-04-30 NOTE — ED Provider Notes (Signed)
[facility] EMERGENCY DEPARTMENT Provider Note   CSN: [id number] Arrival date & time: 04/30/19  3474     History Chief Complaint  Patient presents with  . Abdominal Pain    Hunter Mcmillan is a 69 y.o. male with history of coronary artery disease, history of substance abuse, hypertension, hyperlipidemia and history of hep C who presents with abdominal pain.  Patient states that the pain woke him up from sleep around 3:00 in the morning.  It is over the right upper quadrant and epigastric area.  Feels like a "balloon being blown up inside of me".  Pain is constant and severe.  He has had this pain with biliary colic in the past. He was recommended to schedule an appointment with general surgery but has not done this yet.  He reports associated nausea and vomiting.  Emesis was reportedly reddish-brown in nature.  He ate ramen and chicken last night. He was discharged from the hospital 4 days ago due to STEMI. He was having CP at that time and states it does not feel similar. He reports compliance with his medicines and denies chest pain or shortness of breath.  He also denies fever, chills, diarrhea, constipation, urinary symptoms.  He denies any prior abdominal surgeries.  HPI     Past Medical History:  Diagnosis Date  . Alcoholism ([place])   . CAD (coronary artery disease)    a. 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 Chicken pox   . Depression   . ETOH abuse   . Genital warts   . Hepatic steatosis   . Hepatitis C    a. s/p [name] ~ 2003 in [place], [place] => stopped after 6 mos due to neutropenia - viral load noted to be low at that time;  no follow up since;   b.  abdominal u/s 11/13:  diffuse hepatic steatosis and/or hepatocellular disease    . Hx of echocardiogram    a. Echo 01/06/12: Mild focal basal hypertrophy the septum, vigorous LV, EF 65-70%, Gr 2 diast dysfn, trivial AI, MAC, trivial MR, trivial TR, PASP 36  . Hyperlipidemia   . Hypertension   . Non-STEMI (non-ST elevated myocardial infarction) ([place])   . Stroke [facility])    "minor" pt reported  . Thrombocytopenia ([place])   . Tobacco abuse     Patient Active Problem List   Diagnosis Date Noted  . Presence of drug-eluting stent in right coronary artery 04/25/2019  . Hyperlipidemia with target LDL less than 70 04/25/2019  . Acute inferior STEMI 04/25/2019  . STEMI involving right coronary artery ([place]) 04/24/2019  . Abdominal aortic aneurysm (AAA) 3.0 cm to 5.5 cm in diameter in male ([place]) 01/06/2017  . Essential hypertension 04/09/2015  . Hyperpigmentation of skin 10/05/2014  . COPD (chronic obstructive pulmonary disease) ([place]) 06/26/2014  . Sacroiliac joint dysfunction of right side 06/26/2014  . Alcoholism ([place])   . Cocaine abuse ([place]) 11/18/2013  . Scrotal mass 07/21/2013  . Cirrhosis ([place]) 05/28/2013  . Routine general medical examination at a health care facility 07/19/2012  . Abnormal TSH 04/09/2012  . Coronary artery disease involving native heart with unstable angina  pectoris ([place]) 01/14/2012  . Hx of non-ST elevation myocardial infarction (NSTEMI) 01/05/2012  . Tobacco abuse 01/05/2012  . Chronic hepatitis C without hepatic coma ([place]) 01/05/2012  . Depression, recurrent ([place]) 01/05/2012  . Thrombocytopenia ([place]) 01/05/2012    Past Surgical History:  Procedure Laterality Date  . CORONARY ANGIOGRAPHY N/A 04/24/2019   Procedure: CORONARY ANGIOGRAPHY;  Surgeon: Martinique, Peter M, MD;  Location: [place] [facility] LAB;  Service: Cardiovascular;  Laterality: N/A;  . CORONARY/GRAFT ACUTE MI REVASCULARIZATION N/A 04/24/2019   Procedure: Coronary/Graft Acute MI Revascularization;  Surgeon:  Martinique, Peter M, MD;  Location: [place] [facility];  Service: Cardiovascular;  Laterality: N/A;  . EYE SURGERY  1954   corrective surgery for crossed eyes  . EYE SURGERY  1959  . LEFT HEART CATH AND CORONARY ANGIOGRAPHY N/A 04/24/2019   Procedure: LEFT HEART CATH AND CORONARY ANGIOGRAPHY;  Surgeon: Martinique, Peter M, MD;  Location: [place] [facility];  Service: Cardiovascular;  Laterality: N/A;  . LEFT HEART CATHETERIZATION WITH CORONARY ANGIOGRAM N/A 01/05/2012   Procedure: LEFT HEART CATHETERIZATION WITH CORONARY ANGIOGRAM;  Surgeon: Larey Dresser, MD;  Location: [facility] CATH LAB;  Service: Cardiovascular;  Laterality: N/A;       Family History  Problem Relation Age of Onset  . Heart attack Mother 59  . Diabetes Mother   . Heart disease Mother   . Hyperlipidemia Father   . Hypertension Father   . Diabetes Maternal Grandfather   . Heart disease Maternal Grandfather   . Early death Maternal Grandfather   . Heart attack Maternal Grandfather   . Hearing loss Sister   . Hyperlipidemia Sister   . Depression Brother   . Diabetes Brother   . Hyperlipidemia Brother   . Early death Maternal Grandmother   . Heart disease Maternal Grandmother   . Heart attack Maternal Grandmother   . Hearing loss Sister     Social History   Tobacco Use  . Smoking status: Light Tobacco Smoker    Packs/day: 0.10    Types: Cigarettes  . Smokeless tobacco: Never Used  . Tobacco comment: almost quit, very rare, 1-2 daily  Substance Use Topics  . Alcohol use: Not Currently    Alcohol/week: 0.0 standard drinks    Comment: Last drink: 10 days ago   . Drug use: Yes    Types: Cocaine    Comment: history of cocaine last use 2014    Home Medications Prior to Admission medications   Medication Sig Start Date End Date Taking? Authorizing Provider  albuterol (VENTOLIN HFA) 108 (90 Base) MCG/ACT inhaler Inhale 2 puffs into the lungs every 6 (six) hours as needed for wheezing or shortness of breath. 04/26/19    Leanor Kail, PA  aspirin EC 81 MG tablet Take 81 mg by mouth daily.    [provider]  atorvastatin (LIPITOR) 80 MG tablet Take 1 tablet (80 mg total) by mouth daily at 6 PM. 04/26/19   Bhagat, Bhavinkumar, PA  colchicine 0.6 MG tablet Take 1 tablet (0.6 mg total) by mouth daily. 04/27/19   Bhagat, Crista Luria, PA  cyclobenzaprine (FLEXERIL) 10 MG tablet TAKE 1 TABLET BY MOUTH TWICE DAILY AS NEEDED FOR MUSCLE SPASM Patient taking differently: Take 10 mg by mouth daily as needed for muscle spasms.  01/12/19   Copland, Gay Filler, MD  fluticasone (FLOVENT HFA) 110 MCG/ACT inhaler Inhale 2 puffs into the lungs 2 (two) times daily. 04/26/19 04/25/20  Leanor Kail, PA  metoprolol succinate (TOPROL-XL) 25 MG 24  hr tablet Take 1 tablet (25 mg total) by mouth daily. 04/26/19   Leanor Kail, PA  Multiple Vitamin (MULTIVITAMIN WITH MINERALS) TABS tablet Take 1 tablet by mouth daily.    [provider]  nitroGLYCERIN (NITROSTAT) 0.4 MG SL tablet Place 1 tablet (0.4 mg total) under the tongue every 5 (five) minutes as needed for chest pain. 04/26/19   Leanor Kail, PA  ticagrelor (BRILINTA) 90 MG TABS tablet Take 1 tablet (90 mg total) by mouth 2 (two) times daily. 04/26/19   Leanor Kail, PA    Allergies    Patient has no known allergies.  Review of Systems   Review of Systems  Constitutional: Negative for chills and fever.  Respiratory: Negative for shortness of breath.   Cardiovascular: Negative for chest pain.  Gastrointestinal: Positive for abdominal pain, nausea and vomiting. Negative for constipation and diarrhea.  Genitourinary: Negative for difficulty urinating, dysuria and flank pain.  All other systems reviewed and are negative.   Physical Exam Updated Vital Signs SpO2 96%   Physical Exam Vitals and nursing note reviewed.  Constitutional:      General: He is in acute distress.     Appearance: He is well-developed.     Comments:  Distressed but cooperative  HENT:     Head: Normocephalic and atraumatic.  Eyes:     General: No scleral icterus.       Right eye: No discharge.        Left eye: No discharge.     Conjunctiva/sclera: Conjunctivae normal.     Pupils: Pupils are equal, round, and reactive to light.  Cardiovascular:     Rate and Rhythm: Normal rate and regular rhythm.  Pulmonary:     Effort: Pulmonary effort is normal. Tachypnea present. No respiratory distress.     Breath sounds: Normal breath sounds.  Abdominal:     General: Abdomen is flat. Bowel sounds are normal. There is no distension.     Palpations: Abdomen is soft.     Tenderness: There is abdominal tenderness in the right upper quadrant and epigastric area.     Hernia: No hernia is present.     Comments: Ecchymosis over the LLQ from Lovenox shots  Musculoskeletal:     Cervical back: Normal range of motion.  Skin:    General: Skin is warm and dry.  Neurological:     Mental Status: He is alert and oriented to person, place, and time.  Psychiatric:        Behavior: Behavior normal.     ED Results / Procedures / Treatments   Labs (all labs ordered are listed, but only abnormal results are displayed) Labs Reviewed  CBC WITH DIFFERENTIAL/PLATELET - Abnormal; Notable for the following components:      Result Value   WBC 11.1 (*)    Neutro Abs 10.2 (*)    Lymphs Abs 0.6 (*)    All other components within normal limits  COMPREHENSIVE METABOLIC PANEL - Abnormal; Notable for the following components:   Glucose, Bld 141 (*)    Albumin 3.3 (*)    AST 232 (*)    ALT 364 (*)    Alkaline Phosphatase 200 (*)    Total Bilirubin 1.9 (*)    GFR calc non Af Amer 60 (*)    All other components within normal limits  TROPONIN I (HIGH SENSITIVITY) - Abnormal; Notable for the following components:   Troponin I (High Sensitivity) 1,335 (*)    All other components within normal  limits  TROPONIN I (HIGH SENSITIVITY) - Abnormal; Notable for the  following components:   Troponin I (High Sensitivity) 1,043 (*)    All other components within normal limits  SARS CORONAVIRUS 2 (TAT 6-24 HRS)  LIPASE, BLOOD  URINALYSIS, ROUTINE W REFLEX MICROSCOPIC  LACTIC ACID, PLASMA  LACTIC ACID, PLASMA    EKG EKG Interpretation  Date/Time:  Saturday April 30 2019 08:43:50 EST Ventricular Rate:  109 PR Interval:    QRS Duration: 72 QT Interval:  383 QTC Calculation: 516 R Axis:   -27 Text Interpretation: Sinus tachycardia Inferior infarct, old Probable anterior infarct, age indeterminate Prolonged QT interval Baseline wander in lead(s) V2 V6 Confirmed by Aletta Edouard [id number]) on 04/30/2019 8:48:34 AM   Radiology US Abdomen Limited RUQ  Result Date: 04/30/2019 CLINICAL DATA:  69 year old male with acute RIGHT UPPER abdominal pain for 1 day. EXAM: ULTRASOUND ABDOMEN LIMITED RIGHT UPPER QUADRANT COMPARISON:  10/12/2018 ultrasound FINDINGS: Gallbladder: Multiple tiny mobile gallstones are noted. There is no evidence of gallbladder wall thickening, pericholecystic fluid or sonographic Murphy sign. Common bile duct: Diameter: 6 mm.  No intrahepatic or extrahepatic biliary dilatation. Liver: No focal lesion identified. Within normal limits in parenchymal echogenicity. Portal vein is patent on color Doppler imaging with normal direction of blood flow towards the liver. Other: A small RIGHT pleural effusion is identified. IMPRESSION: 1. Cholelithiasis without other sonographic evidence of acute cholecystitis. No biliary dilatation. 2. Small RIGHT pleural effusion. Electronically Signed   By: Margarette Canada M.D.   On: 04/30/2019 08:22    Procedures Procedures (including critical care time)  Medications Ordered in ED Medications  HYDROmorphone (DILAUDID) injection 1 mg (1 mg Intravenous Given 04/30/19 0753)  ondansetron (ZOFRAN) injection 4 mg (4 mg Intravenous Given 04/30/19 0753)    ED Course  I have reviewed the triage vital signs and the nursing  notes.  Pertinent labs & imaging results that were available during my care of the patient were reviewed by me and considered in my medical decision making (see chart for details).  Clinical Course as of Apr 29 1504  Sat Apr 30, 7527  7939 69 year old male complaining of right upper quadrant abdominal pain associated with some nausea.  Vomited x1.  Recent stent placement.  He said he has had this pain before and he says it is his gallbladder.  Occurs every 6 months or so.  Tender right upper quadrant.  Checking labs pain medication nausea medicine.   [MB]  1232 Dr. Georgette Dover from [place] [facility] is evaluated the patient and recommends medical admission to trend LFTs and troponin.  They will follow along   [MB]    Clinical Course User Index [MB] Hayden Rasmussen, MD   69 year old male presents with acute RUQ/epigastric pain early this morning which feels like biliary colic he's had in the past. On initial exam he is distressed, tachypneic, and mildly hypertensive. He is tender in the RUQ and epigastric region. He was recently admitted for an MI but states the pain does not feel like that. Will obtain labs, UA, EKG, RUQ [place]. Will provide pain and nausea control.  CBC shows mild leukocytosis (11). CMP shows worsening LFTs. AP is 200, AST is 232, ALT 364, and bilirubin 1.9. These are slightly up from when he was hospitalized last week and markedly elevated from when he was last seen in August 2020 for the same problem. On recheck pt feels better just reports a dry mouth. EKG shows new T wave inversion -  will add on Trop to see if it's trending down from hospitalization.   Discussed with Theodoro Grist PA with general surgery who feels worsening LFTs is minimal and with reassuring [place] he would not admit pt for sure. He recommends getting a HIDA scan and they will come to see.  HIDA scan shows possible biliary obstruction. Surgery recommends medicine admission and they will follow but would not operate due to  recent MI and being on antiplatelets. 2nd trop is improved. Will consult for admission.  Discussed with Dr. Roosevelt Locks who will admit for RUQ abdominal pain with possible biliary obstruction, elevated LFTs, and elevated troponins.  MDM Rules/Calculators/A&P                       Final Clinical Impression(s) / ED Diagnoses Final diagnoses:  RUQ abdominal pain  Elevated troponin  Elevated LFTs    Rx / DC Orders ED Discharge Orders    None       Recardo Evangelist, PA-C 04/30/19 1523    Hayden Rasmussen, MD 04/30/19 [phone]

## 2019-04-30 NOTE — ED Notes (Signed)
Pt taken to NM 

## 2019-04-30 NOTE — Progress Notes (Signed)
Pt arrived to room 6N06 via stretcher from the ED. Pt ambulated from stretcher to bed unassisted. Received report from Tobi Bastos, Charity fundraiser. See assessment. Will continue to monitor.

## 2019-04-30 NOTE — ED Triage Notes (Signed)
Pt in via [facility] w/upper abdominal cp, sharp in nature. States it woke him at 0300 this am, had heart attack with stent placement on 2/21 per EMS. C/o red/brown emesis at 0530. He took 3 NTG PTA, no pain relief noted. Has hx of galbladder issues, states this pain is similar. Denies sob, dizziness or pain radiation, skin warm and dry

## 2019-04-30 NOTE — Consult Note (Signed)
Reason for Consult:RUQ pain Referring Physician: Melina Copa, EDP  Hunter Mcmillan is an 69 y.o. male.  HPI: Hunter Mcmillan is a 69 y.o. male with history of coronary artery disease, history of substance abuse, hypertension, hyperlipidemia and history of hep C and cirrhosis who presents with abdominal pain.  Patient states that the pain woke him up from sleep around 3:00 in the morning.  It is over the right upper quadrant and epigastric area.  Feels like a "balloon being blown up inside of me".  Pain is constant and severe.  He has had this pain with biliary colic in the past. He was recommended to schedule an appointment with general surgery but has not done this yet.  He reports associated nausea and vomiting.  Emesis was reportedly reddish-brown in nature.  He ate ramen and chicken last night. He was discharged from the hospital 4 days ago due to STEMI.  He is on Aspirin and Brilenta He was having CP at that time and states it does not feel similar. He reports compliance with his medicines and denies chest pain or shortness of breath.  He also denies fever, chills, diarrhea, constipation, urinary symptoms.  He denies any prior abdominal surgeries.  In reviewing the patient's chart, he has had some chronically mildly elevated liver function tests. 2/21 - AST/ALT 171/124 - T. Bili 1.5  Upon return from HIDA scan, his abdominal pain has resolved.   Past Medical History:  Diagnosis Date  . Alcoholism ([place])   . CAD (coronary artery disease)    a. 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 Chicken pox   . Depression   . ETOH abuse   . Genital warts   . Hepatic steatosis   . Hepatitis C    a. s/p Ribavirin and  Interferon ~ 2003 in [place], [place] => stopped after 6 mos due to neutropenia - viral load noted to be low at that time;  no follow up since;   b. abdominal u/s 11/13:  diffuse hepatic steatosis and/or hepatocellular disease    . Hx of echocardiogram    a. Echo 01/06/12: Mild focal basal hypertrophy the septum, vigorous LV, EF 65-70%, Gr 2 diast dysfn, trivial AI, MAC, trivial MR, trivial TR, PASP 36  . Hyperlipidemia   . Hypertension   . Non-STEMI (non-ST elevated myocardial infarction) ([facility])   . Stroke [facility])    "minor" pt reported  . Thrombocytopenia ([place])   . Tobacco abuse     Past Surgical History:  Procedure Laterality Date  . CORONARY ANGIOGRAPHY N/A 04/24/2019   Procedure: CORONARY ANGIOGRAPHY;  Surgeon: Martinique, Peter M, MD;  Location: [facility];  Service: Cardiovascular;  Laterality: N/A;  . CORONARY/GRAFT ACUTE MI REVASCULARIZATION N/A 04/24/2019   Procedure: Coronary/Graft Acute MI Revascularization;  Surgeon: Martinique, Peter M, MD;  Location: [facility];  Service: Cardiovascular;  Laterality: N/A;  . EYE SURGERY  1954   corrective surgery for crossed eyes  . EYE SURGERY  1959  . LEFT HEART CATH AND CORONARY ANGIOGRAPHY N/A 04/24/2019   Procedure: LEFT HEART CATH AND CORONARY ANGIOGRAPHY;  Surgeon: Martinique, Peter M, MD;  Location: [facility];  Service: Cardiovascular;  Laterality: N/A;  . LEFT HEART CATHETERIZATION WITH CORONARY ANGIOGRAM N/A 01/05/2012   Procedure: LEFT HEART CATHETERIZATION WITH CORONARY ANGIOGRAM;  Surgeon: Larey Dresser, MD;  Location: [facility] C[facility];  Service: Cardiovascular;  Laterality: N/A;    Family History  Problem Relation Age of Onset  . Heart attack Mother 86  . Diabetes Mother   . Heart disease Mother   . Hyperlipidemia Father   . Hypertension Father   . Diabetes Maternal Grandfather   . Heart disease Maternal Grandfather   . Early death Maternal Grandfather   . Heart attack Maternal Grandfather   . Hearing loss Sister   .  Hyperlipidemia Sister   . Depression Brother   . Diabetes Brother   . Hyperlipidemia Brother   . Early death Maternal Grandmother   . Heart disease Maternal Grandmother   . Heart attack Maternal Grandmother   . Hearing loss Sister     Social History:  reports that he has been smoking cigarettes. He has been smoking about 0.10 packs per day. He has never used smokeless tobacco. He reports previous alcohol use. He reports current drug use. Drug: Cocaine.  Allergies: No Known Allergies  Medications:   Prior to Admission medications   Medication Sig Start Date End Date Taking? Authorizing Provider  albuterol (VENTOLIN HFA) 108 (90 Base) MCG/ACT inhaler Inhale 2 puffs into the lungs every 6 (six) hours as needed for wheezing or shortness of breath. 04/26/19   Leanor Kail, PA  aspirin EC 81 MG tablet Take 81 mg by mouth daily.    [provider]  atorvastatin (LIPITOR) 80 MG tablet Take 1 tablet (80 mg total) by mouth daily at 6 PM. 04/26/19   Bhagat, Bhavinkumar, PA  colchicine 0.6 MG tablet Take 1 tablet (0.6 mg total) by mouth daily. 04/27/19   Bhagat, Crista Luria, PA  cyclobenzaprine (FLEXERIL) 10 MG tablet TAKE 1 TABLET BY MOUTH TWICE DAILY AS NEEDED FOR MUSCLE SPASM Patient taking differently: Take 10 mg by mouth daily as needed for muscle spasms.  01/12/19   Copland, Gay Filler, MD  fluticasone (FLOVENT HFA) 110 MCG/ACT inhaler Inhale 2 puffs into the lungs 2 (two) times daily. 04/26/19 04/25/20  Leanor Kail, PA  metoprolol succinate (TOPROL-XL) 25 MG 24 hr tablet Take 1 tablet (25 mg total) by mouth daily. 04/26/19   Leanor Kail, PA  Multiple Vitamin (MULTIVITAMIN WITH MINERALS) TABS tablet Take 1 tablet by mouth daily.    [provider]  nitroGLYCERIN (NITROSTAT) 0.4 MG SL tablet Place 1 tablet (0.4 mg total) under the tongue every 5 (five) minutes as needed for chest pain. 04/26/19   Leanor Kail, PA  ticagrelor (BRILINTA) 90 MG TABS tablet  Take 1 tablet (90 mg total) by mouth 2 (two) times daily. 04/26/19   Leanor Kail, PA     Results for orders placed or performed during the hospital encounter of 04/30/19 (from the past 48 hour(s))  CBC with Differential     Status: Abnormal   Collection Time: 04/30/19  7:33 AM  Result Value Ref Range   WBC 11.1 (H) 4.0 - 10.5 K/uL   RBC 4.36 4.22 - 5.81 MIL/uL   Hemoglobin 14.4 13.0 - 17.0 g/dL   HCT 43.0 39.0 - 52.0 %   MCV 98.6 80.0 - 100.0 fL   MCH 33.0 26.0 - 34.0 pg   MCHC 33.5 30.0 - 36.0 g/dL   RDW 13.1 11.5 - 15.5 %   Platelets 181 150 - 400 K/uL  nRBC 0.0 0.0 - 0.2 %   Neutrophils Relative % 93 %   Neutro Abs 10.2 (H) 1.7 - 7.7 K/uL   Lymphocytes Relative 5 %   Lymphs Abs 0.6 (L) 0.7 - 4.0 K/uL   Monocytes Relative 2 %   Monocytes Absolute 0.2 0.1 - 1.0 K/uL   Eosinophils Relative 0 %   Eosinophils Absolute 0.0 0.0 - 0.5 K/uL   Basophils Relative 0 %   Basophils Absolute 0.0 0.0 - 0.1 K/uL   Immature Granulocytes 0 %   Abs Immature Granulocytes 0.03 0.00 - 0.07 K/uL    Comment: Performed at [facility] [address], [place], [place] [address]  Comprehensive metabolic panel     Status: Abnormal   Collection Time: 04/30/19  7:33 AM  Result Value Ref Range   Sodium 137 135 - 145 mmol/L   Potassium 4.0 3.5 - 5.1 mmol/L   Chloride 101 98 - 111 mmol/L   CO2 25 22 - 32 mmol/L   Glucose, Bld 141 (H) 70 - 99 mg/dL    Comment: Glucose reference range applies only to samples taken after fasting for at least 8 hours.   BUN 16 8 - 23 mg/dL   Creatinine, Ser 1.23 0.61 - 1.24 mg/dL   Calcium 8.9 8.9 - 10.3 mg/dL   Total Protein 6.9 6.5 - 8.1 g/dL   Albumin 3.3 (L) 3.5 - 5.0 g/dL   AST 232 (H) 15 - 41 U/L   ALT 364 (H) 0 - 44 U/L   Alkaline Phosphatase 200 (H) 38 - 126 U/L   Total Bilirubin 1.9 (H) 0.3 - 1.2 mg/dL   GFR calc non Af Amer 60 (L) >60 mL/min   GFR calc Af Amer >60 >60 mL/min   Anion gap 11 5 - 15    Comment: Performed at [facility] [address], [place], [place] [address]  Lipase, blood     Status: None   Collection Time: 04/30/19  7:33 AM  Result Value Ref Range   Lipase 26 11 - 51 U/L    Comment: Performed at [facility] [address], [place], [place] [address]  Troponin I (High Sensitivity)     Status: Abnormal   Collection Time: 04/30/19  7:33 AM  Result Value Ref Range   Troponin I (High Sensitivity) 1,335 (HH) <18 ng/L    Comment: CRITICAL RESULT CALLED TO, READ BACK BY AND VERIFIED WITH: Gordan Payment RN AT [id number] 04/30/19 BY WOOLLEN (NOTE) Elevated high sensitivity troponin I (hsTnI) values and significant  changes across serial measurements may suggest ACS but many other  chronic and acute conditions are known to elevate hsTnI results.  Refer to the Links section for chest pain algorithms and additional  guidance. Performed at [facility], [place] [address] [place], [place] [address]   Urinalysis, Routine w reflex microscopic     Status: None   Collection Time: 04/30/19  9:55 AM  Result Value Ref Range   Color, Urine YELLOW YELLOW   APPearance CLEAR CLEAR   Specific Gravity, Urine 1.014 1.005 - 1.030   pH 6.0 5.0 - 8.0   Glucose, UA NEGATIVE NEGATIVE mg/dL   Hgb urine dipstick NEGATIVE NEGATIVE   Bilirubin Urine NEGATIVE NEGATIVE   Ketones, ur NEGATIVE NEGATIVE mg/dL   Protein, ur NEGATIVE NEGATIVE mg/dL   Nitrite NEGATIVE NEGATIVE   Leukocytes,Ua NEGATIVE NEGATIVE    Comment: Performed at [facility]  [address], [place], [place] [address]    US Abdomen Limited RUQ  Result Date: 04/30/2019 CLINICAL DATA:  69 year old male with acute RIGHT UPPER abdominal pain for 1 day. EXAM: ULTRASOUND ABDOMEN LIMITED RIGHT UPPER QUADRANT COMPARISON:  10/12/2018 ultrasound FINDINGS: Gallbladder: Multiple tiny mobile gallstones are noted. There is no evidence of gallbladder wall thickening, pericholecystic fluid or sonographic Murphy sign. Common bile duct: Diameter: 6 mm.  No  intrahepatic or extrahepatic biliary dilatation. Liver: No focal lesion identified. Within normal limits in parenchymal echogenicity. Portal vein is patent on color Doppler imaging with normal direction of blood flow towards the liver. Other: A small RIGHT pleural effusion is identified. IMPRESSION: 1. Cholelithiasis without other sonographic evidence of acute cholecystitis. No biliary dilatation. 2. Small RIGHT pleural effusion. Electronically Signed   By: Margarette Canada M.D.   On: 04/30/2019 08:22    Review of Systems   Review of Systems  Constitutional: Negative for fever, chills and unexpected weight change.  HENT: Negative for hearing loss, congestion, sore throat, trouble swallowing and voice change.  Eyes: Negative for visual disturbance.  Respiratory: Negative for cough and wheezing.  Cardiovascular: Negative for chest pain, palpitations and leg swelling.  Gastrointestinal: Positive for nausea, vomiting, abdominal pain.  Negative for diarrhea, constipation, blood in stool, abdominal distention and anal bleeding.  Genitourinary: Negative for hematuria, vaginal bleeding and difficulty urinating.  Musculoskeletal: Negative for arthralgias.  Skin: Negative for rash and wound.  Neurological: Negative for seizures, syncope and headaches.  Hematological: Negative for adenopathy. Does not bruise/bleed easily.  Psychiatric/Behavioral: Negative for confusion.  10-system review otherwise negative.  Blood pressure 136/74, pulse (!) 117, temperature 98.4 F (36.9 C), resp. rate 20, weight 67.4 kg, SpO2 94 %. Physical Exam Constitutional:  WDWN in NAD, conversant, no obvious deformities; lying in bed comfortably Eyes:  Pupils equal, round; sclera anicteric; moist conjunctiva; no lid lag HENT:  Oral mucosa moist; good dentition  Neck:  No masses palpated, trachea midline; no thyromegaly Lungs:  CTA bilaterally; normal respiratory effort CV:  Regular rate and rhythm; no murmurs; extremities  well-perfused with no edema Abd:  +bowel sounds, soft, tender in RUQ and epigastrium, no palpable organomegaly; no palpable hernias; some abdominal wall ecchymosis Musc:  Unable to assess gait; no apparent clubbing or cyanosis in extremities Lymphatic:  No palpable cervical or axillary lymphadenopathy Skin:  Warm, dry; no sign of jaundice Psychiatric - alert and oriented x 4; calm mood and affect  Assessment/Plan: History of hepatic cirrhosis and chronic steatosis RUQ - possible acute cholecystitis Recent MI (6 days ago) - still with elevated Troponin Anticoagulated on ASA and Brilenta  HIDA scan pending - If this shows acute cholecystitis, would ask IR to place percutaneous cholecystostomy. However, since he is asymptomatic, this is likely going to be negative for acute cholecystitis. Patient is not currently a surgical candidate with recent MI, persistently elevated troponin, and anticoagulation. We will follow up tomorrow.  TRH can admit to follow LFT's and for cardiac evaluation.  Imogene Burn Cayetano Mikita 04/30/2019, 12:16 PM

## 2019-04-30 NOTE — H&P (Signed)
History and Physical    Hunter Mcmillan HFW:[id number] DOB: [birth year] DOA: 04/30/2019  PCP: [name], MD (Confirm with patient/family/NH records and if not entered, this has to be entered at [facility] point of entry) Patient coming from: Home  I have personally briefly reviewed patient's old medical records in [place]  Chief Complaint: Abd pain  HPI: Hunter Mcmillan is a 69 y.o. male with medical history significant of coronary artery disease, recent STEMI and s/p Stenting x2, history of substance abuse, hypertension, hyperlipidemia and history of hep C  And cirrhosis presented with new onset of abdominal pain.  Patient woke up this morning with severe RUQ pain.  Feels like a "balloon being blown up inside of me".  Pain is constant and severe 7-8/10, cramping like.  He remember that he had similar pain every few months recent 2-3 years. He was recommended to schedule an appointment with general surgery but has not done this yet.  He reports associated nausea and vomiting, non-bloody, non-biliary.  He was discharged from the hospital 4 days ago due to STEMI and two new stents were placed in LCX. He also denies fever, chills, diarrhea, constipation, urinary symptoms.  He denies any prior abdominal surgeries. ED Course: RUQ U/S showing gall stone but no Murphy sign and no CBD dilation. HIDA not ruling out Biliary obstruction but more favorable of hepatic dysfunction.  Review of Systems: As per HPI otherwise 10 point review of systems negative.    Past Medical History:  Diagnosis Date  . Alcoholism ([place])   . CAD (coronary artery disease)    a. 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 Chicken pox    . Depression   . ETOH abuse   . Genital warts   . Hepatic steatosis   . Hepatitis C    a. s/p [name] ~ 2003 in [place], [place] => stopped after 6 mos due to neutropenia - viral load noted to be low at that time;  no follow up since;   b. abdominal u/s 11/13:  diffuse hepatic steatosis and/or hepatocellular disease    . Hx of echocardiogram    a. Echo 01/06/12: Mild focal basal hypertrophy the septum, vigorous LV, EF 65-70%, Gr 2 diast dysfn, trivial AI, MAC, trivial MR, trivial TR, PASP 36  . Hyperlipidemia   . Hypertension   . Non-STEMI (non-ST elevated myocardial infarction) ([place])   . Stroke [facility])    "minor" pt reported  . Thrombocytopenia ([place])   . Tobacco abuse     Past Surgical History:  Procedure Laterality Date  . CORONARY ANGIOGRAPHY N/A 04/24/2019   Procedure: CORONARY ANGIOGRAPHY;  Surgeon: Martinique, Peter M, MD;  Location: [place] [facility];  Service: Cardiovascular;  Laterality: N/A;  . CORONARY/GRAFT ACUTE MI REVASCULARIZATION N/A 04/24/2019   Procedure: Coronary/Graft Acute MI Revascularization;  Surgeon: Martinique, Peter M, MD;  Location: [place] [facility];  Service: Cardiovascular;  Laterality: N/A;  . EYE SURGERY  1954   corrective surgery for crossed eyes  . EYE SURGERY  1959  . LEFT HEART CATH AND CORONARY ANGIOGRAPHY N/A 04/24/2019   Procedure: LEFT HEART CATH AND CORONARY ANGIOGRAPHY;  Surgeon:  Martinique, Peter M, MD;  Location: [place];  Service: Cardiovascular;  Laterality: N/A;  . LEFT HEART CATHETERIZATION WITH CORONARY ANGIOGRAM N/A 01/05/2012   Procedure: LEFT HEART CATHETERIZATION WITH CORONARY ANGIOGRAM;  Surgeon: Larey Dresser, MD;  Location: [facility];  Service: Cardiovascular;  Laterality: N/A;     reports that he has been smoking cigarettes. He has been smoking about 0.10 packs per day. He has never used smokeless tobacco. He reports previous alcohol use. He reports current drug use. Drug: Cocaine.  No Known Allergies  Family  History  Problem Relation Age of Onset  . Heart attack Mother 60  . Diabetes Mother   . Heart disease Mother   . Hyperlipidemia Father   . Hypertension Father   . Diabetes Maternal Grandfather   . Heart disease Maternal Grandfather   . Early death Maternal Grandfather   . Heart attack Maternal Grandfather   . Hearing loss Sister   . Hyperlipidemia Sister   . Depression Brother   . Diabetes Brother   . Hyperlipidemia Brother   . Early death Maternal Grandmother   . Heart disease Maternal Grandmother   . Heart attack Maternal Grandmother   . Hearing loss Sister      Prior to Admission medications   Medication Sig Start Date End Date Taking? Authorizing Provider  albuterol (VENTOLIN HFA) 108 (90 Base) MCG/ACT inhaler Inhale 2 puffs into the lungs every 6 (six) hours as needed for wheezing or shortness of breath. 04/26/19   Leanor Kail, PA  aspirin EC 81 MG tablet Take 81 mg by mouth daily.    [provider]  atorvastatin (LIPITOR) 80 MG tablet Take 1 tablet (80 mg total) by mouth daily at 6 PM. 04/26/19   Bhagat, Bhavinkumar, PA  colchicine 0.6 MG tablet Take 1 tablet (0.6 mg total) by mouth daily. 04/27/19   Bhagat, Crista Luria, PA  cyclobenzaprine (FLEXERIL) 10 MG tablet TAKE 1 TABLET BY MOUTH TWICE DAILY AS NEEDED FOR MUSCLE SPASM Patient taking differently: Take 10 mg by mouth daily as needed for muscle spasms.  01/12/19   Copland, Gay Filler, MD  fluticasone (FLOVENT HFA) 110 MCG/ACT inhaler Inhale 2 puffs into the lungs 2 (two) times daily. 04/26/19 04/25/20  Leanor Kail, PA  metoprolol succinate (TOPROL-XL) 25 MG 24 hr tablet Take 1 tablet (25 mg total) by mouth daily. 04/26/19   Leanor Kail, PA  Multiple Vitamin (MULTIVITAMIN WITH MINERALS) TABS tablet Take 1 tablet by mouth daily.    [provider]  nitroGLYCERIN (NITROSTAT) 0.4 MG SL tablet Place 1 tablet (0.4 mg total) under the tongue every 5 (five) minutes as needed for chest pain.  04/26/19   Leanor Kail, PA  ticagrelor (BRILINTA) 90 MG TABS tablet Take 1 tablet (90 mg total) by mouth 2 (two) times daily. 04/26/19   Leanor Kail, PA    Physical Exam: Vitals:   04/30/19 0745 04/30/19 0748 04/30/19 0844 04/30/19 0948  BP: (!) 145/72 (!) 145/72  136/74  Pulse:  97  (!) 117  Resp: (!) 39 (!) 26 (!) 23 20  Temp:  98.4 F (36.9 C)    SpO2:  97%  94%  Weight:        Constitutional: NAD, calm, comfortable Vitals:   04/30/19 0745 04/30/19 0748 04/30/19 0844 04/30/19 0948  BP: (!) 145/72 (!) 145/72  136/74  Pulse:  97  (!) 117  Resp: (!) 39 (!) 26 (!) 23 20  Temp:  98.4 F (36.9 C)  SpO2:  97%  94%  Weight:       Eyes: PERRL, lids and conjunctivae normal ENMT: Mucous membranes are moist. Posterior pharynx clear of any exudate or lesions.Normal dentition.  Neck: normal, supple, no masses, no thyromegaly Respiratory: clear to auscultation bilaterally, no wheezing, no crackles. Normal respiratory effort. No accessory muscle use.  Cardiovascular: Regular rate and rhythm, no murmurs / rubs / gallops. No extremity edema. 2+ pedal pulses. No carotid bruits.  Abdomen: No masses palpated. No hepatosplenomegaly. Bowel sounds positive. Mild tender on RUQ, no Murphy sign. Musculoskeletal: no clubbing / cyanosis. No joint deformity upper and lower extremities. Good ROM, no contractures. Normal muscle tone.  Skin: no rashes, lesions, ulcers. No induration Neurologic: CN 2-12 grossly intact. Sensation intact, DTR normal. Strength 5/5 in all 4.  Psychiatric: Normal judgment and insight. Alert and oriented x 3. Normal mood.     Labs on Admission: I have personally reviewed following labs and imaging studies  CBC: Recent Labs  Lab 04/24/19 0758 04/24/19 0902 04/25/19 0212 04/30/19 0733  WBC 10.2  --  9.5 11.1*  NEUTROABS 8.2*  --   --  10.2*  HGB 13.3 13.3 13.0 14.4  HCT 40.3 39.0 40.3 43.0  MCV 100.2*  --  105.8* 98.6  PLT 112*  --  114* 332   Basic  Metabolic Panel: Recent Labs  Lab 04/24/19 0758 04/24/19 0902 04/25/19 0212 04/26/19 0314 04/30/19 0733  NA 133* 135 131* 134* 137  K 3.9 3.7 5.0 3.7 4.0  CL 96* 102 100 101 101  CO2 25  --  18* 23 25  GLUCOSE 214* 154* 122* 105* 141*  BUN _0 CREATININE 1.36* 0.90 1.13 1.12 1.23  CALCIUM 9.2  --  8.8* 8.4* 8.9  MG  --   --  1.8  --   --    GFR: Estimated Creatinine Clearance: 54 mL/min (by C-G formula based on SCr of 1.23 mg/dL). Liver Function Tests: Recent Labs  Lab 04/24/19 0758 04/30/19 0733  AST 171* 232*  ALT 124* 364*  ALKPHOS 87 200*  BILITOT 1.5* 1.9*  PROT 7.1 6.9  ALBUMIN 3.7 3.3*   Recent Labs  Lab 04/30/19 0733  LIPASE 26   No results for input(s): AMMONIA in the last 168 hours. Coagulation Profile: Recent Labs  Lab 04/24/19 0758  INR 1.2   Cardiac Enzymes: No results for input(s): CKTOTAL, CKMB, CKMBINDEX, TROPONINI in the last 168 hours. BNP (last 3 results) No results for input(s): PROBNP in the last 8760 hours. HbA1C: No results for input(s): HGBA1C in the last 72 hours. CBG: No results for input(s): GLUCAP in the last 168 hours. Lipid Profile: No results for input(s): CHOL, HDL, LDLCALC, TRIG, CHOLHDL, LDLDIRECT in the last 72 hours. Thyroid Function Tests: No results for input(s): TSH, T4TOTAL, FREET4, T3FREE, THYROIDAB in the last 72 hours. Anemia Panel: No results for input(s): VITAMINB12, FOLATE, FERRITIN, TIBC, IRON, RETICCTPCT in the last 72 hours. Urine analysis:    Component Value Date/Time   COLORURINE YELLOW 04/30/2019 [place] 04/30/2019 0955   LABSPEC 1.014 04/30/2019 0955   PHURINE 6.0 04/30/2019 0955   GLUCOSEU NEGATIVE 04/30/2019 0955   HGBUR NEGATIVE 04/30/2019 0955   BILIRUBINUR NEGATIVE 04/30/2019 0955   [place] 04/30/2019 0955   PROTEINUR NEGATIVE 04/30/2019 0955   UROBILINOGEN 1.0 01/05/2012 1400   NITRITE NEGATIVE 04/30/2019 0955   LEUKOCYTESUR NEGATIVE 04/30/2019  0955    Radiological Exams on Admission: NM Hepatobiliary  Liver Func  Result Date: 04/30/2019 CLINICAL DATA:  Abnormal liver enzymes. EXAM: NUCLEAR MEDICINE HEPATOBILIARY IMAGING TECHNIQUE: Sequential images of the abdomen were obtained out to 60 minutes following intravenous administration of radiopharmaceutical. RADIOPHARMACEUTICALS:  5.45 mCi Tc-55m Choletec IV COMPARISON:  Ultrasound evaluation of 04/30/2019 FINDINGS: Uptake in the liver occurs over time, no visible bowel or biliary activity. Cardiac activity persists for an extended. Quite notable at 10, 15 and 20 minutes. IMPRESSION: 1. Persistent cardiac activity with liver activity and no sign of biliary or bowel activity, findings favor marked hepatic dysfunction. Biliary obstruction remains within the differential given this pattern of uptake. 2. MRI/MRCP could be considered for further evaluation given above findings, to exclude acute biliary obstruction. Electronically Signed   By: GZetta BillsM.D.   On: 04/30/2019 12:29   UKoreaAbdomen Limited RUQ  Result Date: 04/30/2019 CLINICAL DATA:  69year old male with acute RIGHT UPPER abdominal pain for 1 day. EXAM: ULTRASOUND ABDOMEN LIMITED RIGHT UPPER QUADRANT COMPARISON:  10/12/2018 ultrasound FINDINGS: Gallbladder: Multiple tiny mobile gallstones are noted. There is no evidence of gallbladder wall thickening, pericholecystic fluid or sonographic Murphy sign. Common bile duct: Diameter: 6 mm.  No intrahepatic or extrahepatic biliary dilatation. Liver: No focal lesion identified. Within normal limits in parenchymal echogenicity. Portal vein is patent on color Doppler imaging with normal direction of blood flow towards the liver. Other: A small RIGHT pleural effusion is identified. IMPRESSION: 1. Cholelithiasis without other sonographic evidence of acute cholecystitis. No biliary dilatation. 2. Small RIGHT pleural effusion. Electronically Signed   By: JMargarette CanadaM.D.   On: 04/30/2019 08:22     EKG: Independently reviewed. No acute ST-T changes  Assessment/Plan Active Problems:   Cholelithiases   Cholelithiasis   Symptomatic cholelithiasis With mild elevation of WBC count, no systemic signs of fever, physical exam and RUQ UKoreashowed no Murphy sign. Pt reported that he has had multiple episodes in the past with similar symptoms, all resolved spontaneously. Monitor off ABX for now, as there is no strong evidence of acute cholecystitic or cholangitis  Not a surgical candidate now given recent MI and stenting, and Duo-antiplt regimen has to be continued at this time. May need IR cholesystostomy if develop s/s of cholesystitis  Transaminitis  No CBD dilation, slowly trending up of ALT/AST, suspect probably passing/passed small gall stone. Agreed with surgery, will monitor LFT Hold statin for now Non-Tylenol pain meds  Sinus Tachycardia Likely related to RUQ pain, re-evaluate when pain more controlled  Positive troponin S/P recent STEMI and Cath and Stenting, overall trending down compared to readings from last week indicating a resolving of ACS.  Chronic HepC with Cirrhosis No S/S of acute issue, outpt GI F/U.  Recent STEMI Continue Duo-antiplt regimen Will add ACEI  Chronic systolic CHF Clinical compensated, continue current CHF regimen.  COPD No S/S of acute exacerbation.  HTN Resume home meds  Prolonged QTCs Monitor  DVT prophylaxis: Heparin Code Status: Full Code Family Communication: None at bedside Disposition Plan: Once LFT stablized and symptoms improved, pt can be discharged, otherwise may need IR cholesystostomy Consults called: Dr. TMolli Poseyfrom Surgery Admission status: Tele admission   PLequita HaltMD [facility] Pager [phone] 04/30/2019, 1:52 PM

## 2019-04-30 NOTE — ED Notes (Signed)
Pt transported to US

## 2019-05-01 DIAGNOSIS — R7989 Other specified abnormal findings of blood chemistry: Secondary | ICD-10-CM

## 2019-05-01 LAB — HEPATITIS PANEL, ACUTE
HCV Ab: REACTIVE — AB
Hep A IgM: NONREACTIVE
Hep B C IgM: NONREACTIVE
Hepatitis B Surface Ag: NONREACTIVE

## 2019-05-01 LAB — COMPREHENSIVE METABOLIC PANEL
ALT: 276 U/L — ABNORMAL HIGH (ref 0–44)
AST: 127 U/L — ABNORMAL HIGH (ref 15–41)
Albumin: 2.7 g/dL — ABNORMAL LOW (ref 3.5–5.0)
Alkaline Phosphatase: 165 U/L — ABNORMAL HIGH (ref 38–126)
Anion gap: 9 (ref 5–15)
BUN: 15 mg/dL (ref 8–23)
CO2: 25 mmol/L (ref 22–32)
Calcium: 8.5 mg/dL — ABNORMAL LOW (ref 8.9–10.3)
Chloride: 101 mmol/L (ref 98–111)
Creatinine, Ser: 0.97 mg/dL (ref 0.61–1.24)
GFR calc Af Amer: >60 mL/min (ref >60)
GFR calc non Af Amer: >60 mL/min (ref >60)
Glucose, Bld: 99 mg/dL (ref 70–99)
Potassium: 3.9 mmol/L (ref 3.5–5.1)
Sodium: 135 mmol/L (ref 135–145)
Total Bilirubin: 3.3 mg/dL — ABNORMAL HIGH (ref 0.3–1.2)
Total Protein: 5.7 g/dL — ABNORMAL LOW (ref 6.5–8.1)

## 2019-05-01 LAB — CBC
HCT: 35.4 % — ABNORMAL LOW (ref 39.0–52.0)
Hemoglobin: 11.9 g/dL — ABNORMAL LOW (ref 13.0–17.0)
MCH: 32.8 pg (ref 26.0–34.0)
MCHC: 33.6 g/dL (ref 30.0–36.0)
MCV: 97.5 fL (ref 80.0–100.0)
Platelets: 127 10*3/uL — ABNORMAL LOW (ref 150–400)
RBC: 3.63 MIL/uL — ABNORMAL LOW (ref 4.22–5.81)
RDW: 13.2 % (ref 11.5–15.5)
WBC: 11.4 10*3/uL — ABNORMAL HIGH (ref 4.0–10.5)
nRBC: 0 % (ref 0.0–0.2)

## 2019-05-01 NOTE — Progress Notes (Signed)
PROGRESS NOTE    Hunter Mcmillan  TUU:[id number] DOB: [birth year] DOA: 04/30/2019 PCP: Pearline Cables, MD    Brief Narrative:  69 y.o. male with medical history significant of coronary artery disease, recent STEMI and s/p Stenting x2, history of substance abuse, hypertension, hyperlipidemia and history of hep C  And cirrhosis presented with new onset of abdominal pain. Patient woke up this morning with severe RUQ pain. Feels like a "balloon being blown up inside of me". Pain is constant and severe 7-8/10, cramping like. He remember that he had similar pain every few months recent 2-3 years. He was recommended to schedule an appointment with general surgery but has not done this yet. He reports associated nausea and vomiting, non-bloody, non-biliary. He was discharged from the hospital 4 days ago due to STEMI and two new stents were placed in LCX.He also denies fever, chills, diarrhea, constipation, urinary symptoms. He denies any prior abdominal surgeries. ED Course: RUQ U/S showing gall stone but no Murphy sign and no CBD dilation. HIDA not ruling out Biliary obstruction but more favorable of hepatic dysfunction.  Assessment & Plan:   Active Problems:   Cholelithiases   Cholelithiasis  Symptomatic cholelithiasis -With mild elevation of WBC count, no systemic signs of fever, physical exam and RUQ US showed no Murphy sign. -Seen by General Surgery. LFT's are improving per below, although bili trending up today -HIDA noted to be non-diagnostic. Surgery recommending GI consult for rising T-bili -Discussed with GI. Recommendation for MRCP and checking hep B and C  Transaminitis  -No CBD dilation, AST/ALT trending down today, although t.bili rising per above -avoiding hepatotoxic agents -Pt currently tolerating clears without nausea or abd discomfort, requesting advancing diet  Sinus Tachycardia -Likely related to RUQ pain, stable and normal rate at present  Positive  troponin -S/P recent STEMI and Cath and Stenting,  -No chest pain or sob  Chronic HepC with Cirrhosis -Pt s/p treatment with Harvoni -No S/S of acute issue, outpt GI F/U. -Repeat hep C and B per GI recs  Recent STEMI -Continue Duo-antiplt regimen -no chest pain at this time -cont with beta blocker, aspirin  Chronic systolic CHF -Clinical compensated -cont beta blocker  COPD No S/S of acute exacerbation. -On minimal O2 support  HTN -continue home meds  Prolonged QTCs -Cont monitor for now  DVT prophylaxis: Heparin subq Code Status: Full Family Communication: Pt in room, family not at bedside Disposition Plan: from home, d/c home when cleared by general surgery and GI  Consultants:   General Surgery  GI  Procedures:     Antimicrobials: Anti-infectives (From admission, onward)   None       Subjective: Tolerating clears this AM, requesting advancing diet  Objective: Vitals:   04/30/19 1955 05/01/19 0425 05/01/19 0841 05/01/19 1318  BP:  103/67  112/66  Pulse:  75  72  Resp:  20  20  Temp:  98.3 F (36.8 C)  98 F (36.7 C)  TempSrc:  Oral  Oral  SpO2: 99% 97% 95% 100%  Weight:      Height:        Intake/Output Summary (Last 24 hours) at 05/01/2019 1516 Last data filed at 05/01/2019 1317 Gross per 24 hour  Intake 840 ml  Output 1775 ml  Net -935 ml   Filed Weights   04/30/19 0718 04/30/19 1444  Weight: 67.4 kg 67.8 kg    Examination:  General exam: Appears calm and comfortable  Respiratory system: Clear to auscultation. Respiratory effort normal.  Cardiovascular system: S1 & S2 heard, RRR. Gastrointestinal system: Abdomen is nondistended, soft pos BS Central nervous system: Alert and oriented. No focal neurological deficits. Extremities: Symmetric 5 x 5 power. Skin: No rashes, lesions Psychiatry: Judgement and insight appear normal. Mood & affect appropriate.   Data Reviewed: I have personally reviewed following labs and imaging  studies  CBC: Recent Labs  Lab 04/25/19 0212 04/30/19 0733 05/01/19 0325  WBC 9.5 11.1* 11.4*  NEUTROABS  --  10.2*  --   HGB 13.0 14.4 11.9*  HCT 40.3 43.0 35.4*  MCV 105.8* 98.6 97.5  PLT 114* 181 903*   Basic Metabolic Panel: Recent Labs  Lab 04/25/19 0212 04/26/19 0314 04/30/19 0733 05/01/19 0325  NA 131* 134* 137 135  K 5.0 3.7 4.0 3.9  CL 100 101 101 101  CO2 18* 23 25 25   GLUCOSE 122* 105* 141* 99  BUN 23 22 16 15   CREATININE 1.13 1.12 1.23 0.97  CALCIUM 8.8* 8.4* 8.9 8.5*  MG 1.8  --   --   --    GFR: Estimated Creatinine Clearance: 68.9 mL/min (by C-G formula based on SCr of 0.97 mg/dL). Liver Function Tests: Recent Labs  Lab 04/30/19 0733 05/01/19 0325  AST 232* 127*  ALT 364* 276*  ALKPHOS 200* 165*  BILITOT 1.9* 3.3*  PROT 6.9 5.7*  ALBUMIN 3.3* 2.7*   Recent Labs  Lab 04/30/19 0733  LIPASE 26   No results for input(s): AMMONIA in the last 168 hours. Coagulation Profile: No results for input(s): INR, PROTIME in the last 168 hours. Cardiac Enzymes: No results for input(s): CKTOTAL, CKMB, CKMBINDEX, TROPONINI in the last 168 hours. BNP (last 3 results) No results for input(s): PROBNP in the last 8760 hours. HbA1C: No results for input(s): HGBA1C in the last 72 hours. CBG: No results for input(s): GLUCAP in the last 168 hours. Lipid Profile: No results for input(s): CHOL, HDL, LDLCALC, TRIG, CHOLHDL, LDLDIRECT in the last 72 hours. Thyroid Function Tests: No results for input(s): TSH, T4TOTAL, FREET4, T3FREE, THYROIDAB in the last 72 hours. Anemia Panel: No results for input(s): VITAMINB12, FOLATE, FERRITIN, TIBC, IRON, RETICCTPCT in the last 72 hours. Sepsis Labs: Recent Labs  Lab 04/30/19 1538 04/30/19 1919  LATICACIDVEN 1.2 1.6    Recent Results (from the past 240 hour(s))  Respiratory Panel by RT PCR (Flu A&B, Covid) - Nasopharyngeal Swab     Status: None   Collection Time: 04/24/19  8:09 AM   Specimen: Nasopharyngeal Swab    Result Value Ref Range Status   SARS Coronavirus 2 by RT PCR NEGATIVE NEGATIVE Final    Comment: (NOTE) SARS-CoV-2 target nucleic acids are NOT DETECTED. The SARS-CoV-2 RNA is generally detectable in upper respiratoy specimens during the acute phase of infection. The lowest concentration of SARS-CoV-2 viral copies this assay can detect is 131 copies/mL. A negative result does not preclude SARS-Cov-2 infection and should not be used as the sole basis for treatment or other patient management decisions. A negative result may occur with  improper specimen collection/handling, submission of specimen other than nasopharyngeal swab, presence of viral mutation(s) within the areas targeted by this assay, and inadequate number of viral copies (<131 copies/mL). A negative result must be combined with clinical observations, patient history, and epidemiological information. The expected result is Negative. Fact Sheet for Patients:  PinkCheek.be Fact Sheet for Healthcare Providers:  GravelBags.it This test is not yet ap proved or cleared by the [place] and  has been authorized for  detection and/or diagnosis of SARS-CoV-2 by FDA under an Emergency Use Authorization (EUA). This EUA will remain  in effect (meaning this test can be used) for the duration of the COVID-19 declaration under Section 564(b)(1) of the Act, 21 U.S.C. section 360bbb-3(b)(1), unless the authorization is terminated or revoked sooner.    Influenza A by PCR NEGATIVE NEGATIVE Final   Influenza B by PCR NEGATIVE NEGATIVE Final    Comment: (NOTE) The Xpert Xpress SARS-CoV-2/FLU/RSV assay is intended as an aid in  the diagnosis of influenza from Nasopharyngeal swab specimens and  should not be used as a sole basis for treatment. Nasal washings and  aspirates are unacceptable for Xpert Xpress SARS-CoV-2/FLU/RSV  testing. Fact Sheet for  Patients: https://www.moore.com/ Fact Sheet for Healthcare Providers: https://www.young.biz/ This test is not yet approved or cleared by the [place] FDA and  has been authorized for detection and/or diagnosis of SARS-CoV-2 by  FDA under an Emergency Use Authorization (EUA). This EUA will remain  in effect (meaning this test can be used) for the duration of the  Covid-19 declaration under Section 564(b)(1) of the Act, 21  U.S.C. section 360bbb-3(b)(1), unless the authorization is  terminated or revoked. Performed at [facility], [address] [place], [place] [address]   MRSA PCR Screening     Status: None   Collection Time: 04/24/19 10:27 AM   Specimen: Nasal Mucosa; Nasopharyngeal  Result Value Ref Range Status   MRSA by PCR NEGATIVE NEGATIVE Final    Comment:        The GeneXpert MRSA Assay (FDA approved for NASAL specimens only), is one component of a comprehensive MRSA colonization surveillance program. It is not intended to diagnose MRSA infection nor to guide or monitor treatment for MRSA infections. Performed at [facility], [address] [place], [place] [address]   SARS CORONAVIRUS 2 (TAT 6-24 HRS) Nasopharyngeal Nasopharyngeal Swab     Status: None   Collection Time: 04/30/19  1:39 PM   Specimen: Nasopharyngeal Swab  Result Value Ref Range Status   SARS Coronavirus 2 NEGATIVE NEGATIVE Final    Comment: (NOTE) SARS-CoV-2 target nucleic acids are NOT DETECTED. The SARS-CoV-2 RNA is generally detectable in upper and lower respiratory specimens during the acute phase of infection. Negative results do not preclude SARS-CoV-2 infection, do not rule out co-infections with other pathogens, and should not be used as the sole basis for treatment or other patient management decisions. Negative results must be combined with clinical observations, patient history, and epidemiological information. The  expected result is Negative. Fact Sheet for Patients: HairSlick.no Fact Sheet for Healthcare Providers: quierodirigir.com This test is not yet approved or cleared by the [place] FDA and  has been authorized for detection and/or diagnosis of SARS-CoV-2 by FDA under an Emergency Use Authorization (EUA). This EUA will remain  in effect (meaning this test can be used) for the duration of the COVID-19 declaration under Section 56 4(b)(1) of the Act, 21 U.S.C. section 360bbb-3(b)(1), unless the authorization is terminated or revoked sooner. Performed at [facility], [address], [place], [place] [address]      Radiology Studies: NM Hepatobiliary Liver Func  Result Date: 04/30/2019 CLINICAL DATA:  Abnormal liver enzymes. EXAM: NUCLEAR MEDICINE HEPATOBILIARY IMAGING TECHNIQUE: Sequential images of the abdomen were obtained out to 60 minutes following intravenous administration of radiopharmaceutical. RADIOPHARMACEUTICALS:  5.45 mCi Tc-12m  Choletec IV COMPARISON:  Ultrasound evaluation of 04/30/2019 FINDINGS: Uptake in the liver occurs over  time, no visible bowel or biliary activity. Cardiac activity persists for an extended. Quite notable at 10, 15 and 20 minutes. IMPRESSION: 1. Persistent cardiac activity with liver activity and no sign of biliary or bowel activity, findings favor marked hepatic dysfunction. Biliary obstruction remains within the differential given this pattern of uptake. 2. MRI/MRCP could be considered for further evaluation given above findings, to exclude acute biliary obstruction. Electronically Signed   By: Donzetta Kohut M.D.   On: 04/30/2019 12:29   US Abdomen Limited RUQ  Result Date: 04/30/2019 CLINICAL DATA:  69 year old male with acute RIGHT UPPER abdominal pain for 1 day. EXAM: ULTRASOUND ABDOMEN LIMITED RIGHT UPPER QUADRANT COMPARISON:  10/12/2018 ultrasound FINDINGS: Gallbladder: Multiple tiny  mobile gallstones are noted. There is no evidence of gallbladder wall thickening, pericholecystic fluid or sonographic Murphy sign. Common bile duct: Diameter: 6 mm.  No intrahepatic or extrahepatic biliary dilatation. Liver: No focal lesion identified. Within normal limits in parenchymal echogenicity. Portal vein is patent on color Doppler imaging with normal direction of blood flow towards the liver. Other: A small RIGHT pleural effusion is identified. IMPRESSION: 1. Cholelithiasis without other sonographic evidence of acute cholecystitis. No biliary dilatation. 2. Small RIGHT pleural effusion. Electronically Signed   By: Harmon Pier M.D.   On: 04/30/2019 08:22    Scheduled Meds: . aspirin EC  81 mg Oral Daily  . budesonide (PULMICORT) nebulizer solution  0.25 mg Nebulization BID  . colchicine  0.6 mg Oral Daily  . heparin  5,000 Units Subcutaneous Q8H  . metoprolol succinate  25 mg Oral Daily  . multivitamin with minerals  1 tablet Oral Daily  . ticagrelor  90 mg Oral BID   Continuous Infusions:   LOS: 1 day   Rickey Barbara, MD [facility] Pager On Amion  If 7PM-7AM, please contact night-coverage 05/01/2019, 3:16 PM

## 2019-05-01 NOTE — Plan of Care (Signed)
  Problem: Education: Goal: Knowledge of General Education information will improve Description: Including pain rating scale, medication(s)/side effects and non-pharmacologic comfort measures Outcome: Progressing   Problem: Health Behavior/Discharge Planning: Goal: Ability to manage health-related needs will improve Outcome: Progressing   Problem: Clinical Measurements: Goal: Ability to maintain clinical measurements within normal limits will improve Outcome: Progressing Goal: Diagnostic test results will improve Outcome: Progressing Goal: Cardiovascular complication will be avoided Outcome: Progressing   Problem: Nutrition: Goal: Adequate nutrition will be maintained Outcome: Progressing   Problem: Pain Managment: Goal: General experience of comfort will improve Outcome: Progressing   Problem: Safety: Goal: Ability to remain free from injury will improve Outcome: Progressing   Problem: Skin Integrity: Goal: Risk for impaired skin integrity will decrease Outcome: Progressing

## 2019-05-01 NOTE — Progress Notes (Signed)
CC: Abdominal and chest pain  Subjective: He says his pain when he came in was just kind of under his xiphoid more on the right side than the left.  He is having no pain now and is hungry and wants to eat.  Objective: Vital signs in last 24 hours: Temp:  [98.1 F (36.7 C)-98.4 F (36.9 C)] 98.3 F (36.8 C) (02/28 0425) Pulse Rate:  [60-102] 75 (02/28 0425) Resp:  [18-24] 20 (02/28 0425) BP: (88-108)/(60-67) 103/67 (02/28 0425) SpO2:  [94 %-99 %] 95 % (02/28 0841) Weight:  [67.8 kg] 67.8 kg (02/27 1444) Last BM Date: 04/30/19 360 p.o. recorded today nothing recorded yesterday 800 urine recorded BM x1 Afebrile vital signs are stable 1 blood pressure down 88/60 WBC stable at 11.4 H/H down 11.9/35.4 Platelets 127,000 RUQ [place] 2/27: Multiple tiny gallstones no evidence of gallbladder wall thickening/pericholecystic fluid or sonographic Murphy sign CBD 6 mm HIDA scan 2/27: Uptake in the liver occurs every time no visible bowel or biliary activity.  Findings consistent with marked hepatic dysfunction biliary obstruction remains in the differential.  Intake/Output from previous day: 02/27 0701 - 02/28 0700 In: -  Out: 800 [Urine:800] Intake/Output this shift: Total I/O In: 360 [P.O.:360] Out: 375 [Urine:375]  General appearance: alert, cooperative and no distress Resp: clear to auscultation bilaterally Cardio: Regular rate and rhythm GI: soft, non-tender; bowel sounds normal; no masses,  no organomegaly and Some ecchymosis from probable Lovenox injections lower abdomen. Extremities: extremities normal, atraumatic, no cyanosis or edema  Lab Results:  Recent Labs    04/30/19 0733 05/01/19 0325  WBC 11.1* 11.4*  HGB 14.4 11.9*  HCT 43.0 35.4*  PLT 181 127*    BMET Recent Labs    04/30/19 0733 05/01/19 0325  NA 137 135  K 4.0 3.9  CL 101 101  CO2 25 25  GLUCOSE 141* 99  BUN 16 15  CREATININE 1.23 0.97  CALCIUM 8.9 8.5*   PT/INR No results for input(s):  LABPROT, INR in the last 72 hours.  Recent Labs  Lab 04/30/19 0733 05/01/19 0325  AST 232* 127*  ALT 364* 276*  ALKPHOS 200* 165*  BILITOT 1.9* 3.3*  PROT 6.9 5.7*  ALBUMIN 3.3* 2.7*     Lipase     Component Value Date/Time   LIPASE 26 04/30/2019 0733     Medications: . aspirin EC  81 mg Oral Daily  . budesonide (PULMICORT) nebulizer solution  0.25 mg Nebulization BID  . colchicine  0.6 mg Oral Daily  . heparin  5,000 Units Subcutaneous Q8H  . metoprolol succinate  25 mg Oral Daily  . multivitamin with minerals  1 tablet Oral Daily  . ticagrelor  90 mg Oral BID   Antibiotics Given (last 72 hours)    None      Assessment/Plan Recent MI with ongoing elevated troponins  - Troponin 1335 >> 1043 PTCA with stenting 04/24/2019 Dr. Peter [place] Anticoagulation aspirin/Brilinta Hx alcohol abuse Anemia/thrombocytopenia Hx depression    Right upper quadrant abdominal pain -possible cholecystitis Hx hepatitis C/chronic steatosis AST/ALT 232/364 >> 127/276 T bilirubin 1.9>> 3.3  FEN: Clear liquid ID: None DVT: Heparin/Brilinta/aspirin Follow-up: TBD   Plan: Is AST/ALT is improving his bilirubin is going up she is concerned for some obstruction.  He is hungry wants to eat but will review with Dr. Donell Beers.  We need to decide whether to go forward with an MRCP/GI consult.  LOS: 1 day    Thiago Ragsdale 05/01/2019 Please see Amion

## 2019-05-01 NOTE — Progress Notes (Deleted)
Cardiology Office Note   Date:  05/01/2019   ID:  Hunter Mcmillan, DOB Apr 14, 1950, MRN [id number]  PCP:  [name], MD  Cardiologist: Dr.Jordan  No chief complaint on file.    History of Present Illness: Hunter Mcmillan is a 69 y.o. male who presents for post hospital follow up after admission for Acute Inferior STEMI, HTN,HL, Tobacco abuse and ETOH Hepatitis,  and COPD. 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 He was treated medically.   Emergent cardiac cath revealed severe 2 vessel CAD with 100% proximal RCA with heavy thrombosis and 90% small LCx stenosis. He had successful PCI of the RCA with thrombectomy and stenting with DES X 2. He had repeat cath approximately 2 hours later, this did not show any new changes. He was recommended for medical management.   Echocardiogram revealed EF of 40-45% and grade I DD. He was treated with DAPT with ASA and Brilinta, added BB and high intensity statin. ACE/ARB was not added due to hypotension. He was also treated for pericarditis and was given colchicine for one week. He was also given albuterol and fluticasone inhaler due to ongoing DOE during Phase I cardiac rehab.  He returned to the ED on 04/30/2019 with complaints of epigastric pain and pain over the right upper quadrant. Felt like a "balloon blowing up" inside chest. He has a history of biliary colic.  Past Medical History:  Diagnosis Date  . Alcoholism ([place])   . CAD (coronary artery disease)    a. 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  Chicken pox   . Depression   . ETOH abuse   . Genital warts   . Hepatic steatosis   . Hepatitis C    a. s/p [name] ~ 2003 in [place], [place] => stopped after 6 mos due to neutropenia - viral load noted to be low at that time;  no follow up since;   b. abdominal u/s 11/13:  diffuse hepatic steatosis and/or hepatocellular disease    . Hx of echocardiogram    a. Echo 01/06/12: Mild focal basal hypertrophy the septum, vigorous LV, EF 65-70%, Gr 2 diast dysfn, trivial AI, MAC, trivial MR, trivial TR, PASP 36  . Hyperlipidemia   . Hypertension   . Non-STEMI (non-ST elevated myocardial infarction) ([place])   . Stroke [facility])    "minor" pt reported  . Thrombocytopenia ([place])   . Tobacco abuse     Past Surgical History:  Procedure Laterality Date  . CORONARY ANGIOGRAPHY N/A 04/24/2019   Procedure: CORONARY ANGIOGRAPHY;  Surgeon: Martinique, Peter M, MD;  Location: [facility];  Service: Cardiovascular;  Laterality: N/A;  . CORONARY/GRAFT ACUTE MI REVASCULARIZATION N/A 04/24/2019   Procedure: Coronary/Graft Acute MI Revascularization;  Surgeon: Martinique, Peter M, MD;  Location: [facility];  Service: Cardiovascular;  Laterality: N/A;  . EYE SURGERY  1954   corrective surgery for crossed eyes  . EYE SURGERY  1959  . LEFT HEART CATH AND CORONARY ANGIOGRAPHY N/A  04/24/2019   Procedure: LEFT HEART CATH AND CORONARY ANGIOGRAPHY;  Surgeon: Martinique, Peter M, MD;  Location: [facility];  Service: Cardiovascular;  Laterality: N/A;  . LEFT HEART CATHETERIZATION WITH CORONARY ANGIOGRAM N/A 01/05/2012   Procedure: LEFT HEART CATHETERIZATION WITH CORONARY ANGIOGRAM;  Surgeon: Larey Dresser, MD;  Location: [facility];  Service: Cardiovascular;  Laterality: N/A;     No current facility-administered medications for this visit.   No current outpatient medications on file.   Facility-Administered Medications Ordered in Other Visits  Medication Dose Route Frequency Provider Last Rate  Last Admin  . albuterol (PROVENTIL) (2.5 MG/3ML) 0.083% nebulizer solution 2.5 mg  2.5 mg Inhalation Q6H PRN Wynetta Fines T, MD      . aspirin EC tablet 81 mg  81 mg Oral Daily Wynetta Fines T, MD   81 mg at 05/01/19 0902  . budesonide (PULMICORT) nebulizer solution 0.25 mg  0.25 mg Nebulization BID Wynetta Fines T, MD   0.25 mg at 05/01/19 0840  . colchicine tablet 0.6 mg  0.6 mg Oral Daily Wynetta Fines T, MD   0.6 mg at 05/01/19 0902  . cyclobenzaprine (FLEXERIL) tablet 10 mg  10 mg Oral Daily PRN Wynetta Fines T, MD      . heparin injection 5,000 Units  5,000 Units Subcutaneous Q8H Lequita Halt, MD   5,000 Units at 05/01/19 [phone]  . HYDROmorphone (DILAUDID) injection 0.5 mg  0.5 mg Intravenous Q4H PRN Wynetta Fines T, MD      . metoprolol succinate (TOPROL-XL) 24 hr tablet 25 mg  25 mg Oral Daily Wynetta Fines T, MD   25 mg at 05/01/19 0901  . multivitamin with minerals tablet 1 tablet  1 tablet Oral Daily Lequita Halt, MD   1 tablet at 05/01/19 0902  . ondansetron (ZOFRAN) tablet 4 mg  4 mg Oral Q6H PRN Wynetta Fines T, MD       Or  . ondansetron [facility]) injection 4 mg  4 mg Intravenous Q6H PRN Wynetta Fines T, MD      . oxyCODONE (Oxy IR/ROXICODONE) immediate release tablet 5 mg  5 mg Oral Q4H PRN Wynetta Fines T, MD      . ticagrelor [facility]) tablet 90 mg  90 mg Oral BID Lequita Halt, MD   90 mg at 05/01/19 1941    Allergies:   Patient has no known allergies.    Social History:  The patient  reports that he has been smoking cigarettes. He has been smoking about 0.10 packs per day. He has never used smokeless tobacco. He reports previous alcohol use. He reports current drug use. Drug: Cocaine.   Family History:  The patient's family history includes Depression in his brother; Diabetes in his brother, maternal grandfather, and mother; Early death in his maternal grandfather and maternal grandmother; Hearing loss in his sister and sister; Heart attack in his maternal grandfather and maternal grandmother;  Heart attack (age of onset: 76) in his mother; Heart disease in his maternal grandfather, maternal grandmother, and mother; Hyperlipidemia in his brother, father, and sister; Hypertension in his father.    ROS: All other systems are reviewed and negative. Unless otherwise mentioned in H&P    PHYSICAL EXAM: VS:  There were no vitals taken for this visit. , BMI There is no height or weight on file to calculate BMI. GEN: Well nourished, well developed, in no acute distress HEENT: normal Neck: no JVD, carotid bruits, or masses Cardiac: ***RRR; no murmurs,  rubs, or gallops,no edema  Respiratory:  Clear to auscultation bilaterally, normal work of breathing GI: soft, nontender, nondistended, + BS MS: no deformity or atrophy Skin: warm and dry, no rash Neuro:  Strength and sensation are intact Psych: euthymic mood, full affect   EKG:  EKG {ACTION; IS/IS YIR:[id number]} ordered today. The ekg ordered today demonstrates ***   Recent Labs: 04/25/2019: Magnesium 1.8 05/01/2019: ALT 276; BUN 15; Creatinine, Ser 0.97; Hemoglobin 11.9; Platelets 127; Potassium 3.9; Sodium 135    Lipid Panel    Component Value Date/Time   CHOL 94 04/26/2019 0314   TRIG 63 04/26/2019 0314   HDL 16 (L) 04/26/2019 0314   CHOLHDL 5.9 04/26/2019 0314   VLDL 13 04/26/2019 0314   LDLCALC 65 04/26/2019 0314      Wt Readings from Last 3 Encounters:  04/30/19 149 lb 7.6 oz (67.8 kg)  04/26/19 148 lb 8 oz (67.4 kg)  12/22/18 151 lb (68.5 kg)      Other studies Reviewed: Echo 04/24/19 1. Left ventricular ejection fraction, by estimation, is 40 to 45%. The  left ventricle has mildly decreased function. The left ventricle demonstrates regional wall motion abnormalities (see scoring diagram/findings for description). There is mild left ventricular hypertrophy. Left ventricular diastolic parameters are consistent with Grade I diastolic dysfunction (impaired relaxation).  2. Right ventricular systolic function is  severely reduced. The right  ventricular size is mildly enlarged. There is mildly elevated pulmonary  artery systolic pressure.  3. The mitral valve is grossly normal. Trivial mitral valve egurgitation.  4. Tricuspid valve regurgitation is mild to moderate.  5. The aortic valve has an indeterminant number of cusps. Aortic valve regurgitation is not visualized.  6. The inferior vena cava is dilated in size with <50% respiratory variability, suggesting right atrial pressure of 15 mmHg.   CORONARY ANGIOGRAPHY 04/24/2019  Conclusion    Prox Cx lesion is 90% stenosed.  Previously placed Prox RCA to Mid RCA stent (unknown type) is widely patent.  1. Continued excellent patency of the RCA stents. The LCx stenosis is chronic and this is a small vessel.   Plan: continue medical management. Will remove femoral sheath manually. Will add colchicine for possible pericardial component to pain. Prn analgesics.    Coronary/Graft Acute MI Revascularization  04/24/2019  LEFT HEART CATH AND CORONARY ANGIOGRAPHY  Conclusion    Ost LM lesion is 25% stenosed.  Mid LAD lesion is 25% stenosed.  Prox Cx lesion is 90% stenosed.  Ost RCA to Mid RCA lesion is 100% stenosed.  A drug-eluting stent was successfully placed using a STENT RESOLUTE ONYX 3.5X38.  A drug-eluting stent was successfully placed using a STENT RESOLUTE ONYX 3.5X30.  Post intervention, there is a 0% residual stenosis.  LV end diastolic pressure is normal.  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 Severe 2 vessel CAD - culprit lesion is 100% occlusion of the proximal RCA with heavy thrombus - 90% small LCx 3. Normal LVEDP 4. Successful PCI of the RCA with thrombectomy and stenting with DES x 2.       ASSESSMENT AND PLAN:  1.  ***   Current medicines are reviewed at length with the patient today.  I have spent *** dedicated to the care of this patient on the date of this encounter to include  pre-visit review of records, assessment, management and diagnostic testing,with shared decision making.  Labs/ tests ordered today include: *** Phill Myron. [place] Pugh, ANP, AACC   05/01/2019  10:46 AM    [facility] [address] Office [phone] Fax [phone]  Notice: This dictation was prepared with Dragon dictation along with smaller phrase technology. Any transcriptional errors that result from this process are unintentional and may not be corrected upon review.

## 2019-05-02 ENCOUNTER — Other Ambulatory Visit: Payer: Self-pay

## 2019-05-02 ENCOUNTER — Inpatient Hospital Stay (HOSPITAL_COMMUNITY): Payer: Medicare Other

## 2019-05-02 ENCOUNTER — Encounter (HOSPITAL_COMMUNITY): Payer: Self-pay | Admitting: Internal Medicine

## 2019-05-02 ENCOUNTER — Ambulatory Visit: Payer: Medicare Other | Admitting: Adult Health

## 2019-05-02 DIAGNOSIS — R109 Unspecified abdominal pain: Secondary | ICD-10-CM

## 2019-05-02 DIAGNOSIS — R778 Other specified abnormalities of plasma proteins: Secondary | ICD-10-CM

## 2019-05-02 HISTORY — DX: Unspecified abdominal pain: R10.9

## 2019-05-02 LAB — CBC
HCT: 37.7 % — ABNORMAL LOW (ref 39.0–52.0)
Hemoglobin: 13 g/dL (ref 13.0–17.0)
MCH: 33.3 pg (ref 26.0–34.0)
MCHC: 34.5 g/dL (ref 30.0–36.0)
MCV: 96.7 fL (ref 80.0–100.0)
Platelets: 139 10*3/uL — ABNORMAL LOW (ref 150–400)
RBC: 3.9 MIL/uL — ABNORMAL LOW (ref 4.22–5.81)
RDW: 13.4 % (ref 11.5–15.5)
WBC: 7.4 10*3/uL (ref 4.0–10.5)
nRBC: 0 % (ref 0.0–0.2)

## 2019-05-02 LAB — COMPREHENSIVE METABOLIC PANEL
ALT: 203 U/L — ABNORMAL HIGH (ref 0–44)
AST: 90 U/L — ABNORMAL HIGH (ref 15–41)
Albumin: 2.8 g/dL — ABNORMAL LOW (ref 3.5–5.0)
Alkaline Phosphatase: 192 U/L — ABNORMAL HIGH (ref 38–126)
Anion gap: 10 (ref 5–15)
BUN: 10 mg/dL (ref 8–23)
CO2: 23 mmol/L (ref 22–32)
Calcium: 8.7 mg/dL — ABNORMAL LOW (ref 8.9–10.3)
Chloride: 103 mmol/L (ref 98–111)
Creatinine, Ser: 1.04 mg/dL (ref 0.61–1.24)
GFR calc Af Amer: >60 mL/min (ref >60)
GFR calc non Af Amer: >60 mL/min (ref >60)
Glucose, Bld: 101 mg/dL — ABNORMAL HIGH (ref 70–99)
Potassium: 4.2 mmol/L (ref 3.5–5.1)
Sodium: 136 mmol/L (ref 135–145)
Total Bilirubin: 3.9 mg/dL — ABNORMAL HIGH (ref 0.3–1.2)
Total Protein: 6.1 g/dL — ABNORMAL LOW (ref 6.5–8.1)

## 2019-05-02 LAB — HCV RNA QUANT: HCV Quantitative: NOT DETECTED IU/mL (ref >50)

## 2019-05-02 MED ORDER — POLYETHYLENE GLYCOL 3350 17 G PO PACK: 17.0000 g | PACK | Freq: Every day | ORAL | Status: DC | PRN

## 2019-05-02 MED ORDER — PANTOPRAZOLE SODIUM 40 MG PO TBEC
40.0000 mg | DELAYED_RELEASE_TABLET | Freq: Two times a day (BID) | ORAL | Status: DC
  Administered 2019-05-02 – 2019-05-03 (×2): 40 mg via ORAL
  Filled 2019-05-02 (×2): qty 1

## 2019-05-02 MED ORDER — DOCUSATE SODIUM 100 MG PO CAPS
100.0000 mg | ORAL_CAPSULE | Freq: Two times a day (BID) | ORAL | Status: DC
  Administered 2019-05-02 – 2019-05-03 (×3): 100 mg via ORAL
  Filled 2019-05-02 (×3): qty 1

## 2019-05-02 MED ORDER — GADOBUTROL 1 MMOL/ML IV SOLN
7.0000 mL | Freq: Once | INTRAVENOUS | Status: AC | PRN
  Administered 2019-05-02: 7 mL via INTRAVENOUS

## 2019-05-02 NOTE — Plan of Care (Signed)

## 2019-05-02 NOTE — Consult Note (Signed)
Referring Provider: Dr. Marylu Lund Primary Care Physician:  Lorelei Pont Gay Filler, MD Primary Gastroenterologist:  Althia Forts  Reason for Consultation:  Abdominal pain  HPI: Hunter Mcmillan is a 69 y.o. male  With past medical history of coronary artery disease with recent STEMI and stenting x2 on 04/24/2019, history of substance abuse, hypertension, hyperlipidemia and history of hep C (treated) presenting with abdominal pain.  He states that on Saturday 2/27, he had severe epigastric pain that radiated to the RUQ and LUQ, which caused him to present to the ED. He also noted shortness of breath, nausea, and one episode of hematemesis that day.  Today, he reported one well-formed black stool.  BUN normal past 2 days.  On arrival, CMP pertinent for AST 232, ALT 364, Alk phos 200, and total bili 1.9. Hgb 11.9 (down from 14.7 on 04/30/19). Lipase WNL (26).  HCV RNA quant pending.  Troponin elevated to 1335. Today, LFTs trending down.  AST 90, ALT 203, alk phos 192.  T bili with delayed elevation to 3.9.  MRI showed cholelithiasis without acute cholecystitis or any CBD abnormalities.  No signs of cirrhosis.  Hepatobiliary scan showed no biliary excretion consistent with hepatocellular disease.   Since Saturday, he has not had any abdominal pain or other GI symptoms.  He currently denies abdominal pain, nausea, and vomiting.  He denies dysphagia and heartburn.  He denies diarrhea or loose stools.  He is very hungry and wants to eat.  He denies family history of gastrointestinal malignancies.  He notes his sister has Crohn's disease.   Past Medical History:  Diagnosis Date  . Abdominal pain 05/02/2019  . Alcoholism ([place])   . CAD (coronary artery disease)    a. 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 Chicken pox   . Depression   . ETOH abuse   . Genital warts   . Hepatic steatosis   . Hepatitis C    a. s/p [name] ~ 2003 in [place], [place] => stopped after 6 mos due to neutropenia - viral load noted to be low at that time;  no follow up since;   b. abdominal u/s 11/13:  diffuse hepatic steatosis and/or hepatocellular disease    . Hx of echocardiogram    a. Echo 01/06/12: Mild focal basal hypertrophy the septum, vigorous LV, EF 65-70%, Gr 2 diast dysfn, trivial AI, MAC, trivial MR, trivial TR, PASP 36  . Hyperlipidemia   . Hypertension   . Non-STEMI (non-ST elevated myocardial infarction) ([place])   . Stroke [facility])    "minor" pt reported  . Thrombocytopenia ([place])   . Tobacco abuse     Past Surgical History:  Procedure Laterality Date  . CORONARY ANGIOGRAPHY N/A 04/24/2019   Procedure: CORONARY ANGIOGRAPHY;  Surgeon: Martinique, Peter M, MD;  Location: [facility];  Service: Cardiovascular;  Laterality: N/A;  . CORONARY/GRAFT ACUTE MI REVASCULARIZATION N/A 04/24/2019   Procedure: Coronary/Graft Acute MI Revascularization;  Surgeon: Martinique, Peter M, MD;  Location: [facility];  Service: Cardiovascular;  Laterality: N/A;  . EYE SURGERY  1954   corrective surgery for crossed eyes  . EYE SURGERY  1959  .  LEFT HEART CATH AND CORONARY ANGIOGRAPHY N/A 04/24/2019   Procedure: LEFT HEART CATH AND CORONARY ANGIOGRAPHY;  Surgeon: Martinique, Peter M, MD;  Location: [facility];  Service: Cardiovascular;  Laterality: N/A;  . LEFT HEART CATHETERIZATION WITH CORONARY ANGIOGRAM N/A 01/05/2012   Procedure: LEFT HEART CATHETERIZATION WITH CORONARY ANGIOGRAM;  Surgeon: Larey Dresser, MD;  Location: [facility] C[facility];  Service: Cardiovascular;  Laterality: N/A;    Prior to Admission medications   Medication Sig Start Date End Date Taking? Authorizing Provider  albuterol (VENTOLIN HFA) 108 (90 Base)  MCG/ACT inhaler Inhale 2 puffs into the lungs every 6 (six) hours as needed for wheezing or shortness of breath. 04/26/19   Leanor Kail, PA  aspirin EC 81 MG tablet Take 81 mg by mouth daily.    [provider]  atorvastatin (LIPITOR) 80 MG tablet Take 1 tablet (80 mg total) by mouth daily at 6 PM. 04/26/19   Bhagat, Bhavinkumar, PA  colchicine 0.6 MG tablet Take 1 tablet (0.6 mg total) by mouth daily. 04/27/19   Bhagat, Crista Luria, PA  cyclobenzaprine (FLEXERIL) 10 MG tablet TAKE 1 TABLET BY MOUTH TWICE DAILY AS NEEDED FOR MUSCLE SPASM Patient taking differently: Take 10 mg by mouth daily as needed for muscle spasms.  01/12/19   Copland, Gay Filler, MD  fluticasone (FLOVENT HFA) 110 MCG/ACT inhaler Inhale 2 puffs into the lungs 2 (two) times daily. 04/26/19 04/25/20  Leanor Kail, PA  metoprolol succinate (TOPROL-XL) 25 MG 24 hr tablet Take 1 tablet (25 mg total) by mouth daily. 04/26/19   Leanor Kail, PA  Multiple Vitamin (MULTIVITAMIN WITH MINERALS) TABS tablet Take 1 tablet by mouth daily.    [provider]  nitroGLYCERIN (NITROSTAT) 0.4 MG SL tablet Place 1 tablet (0.4 mg total) under the tongue every 5 (five) minutes as needed for chest pain. 04/26/19   Leanor Kail, PA  ticagrelor (BRILINTA) 90 MG TABS tablet Take 1 tablet (90 mg total) by mouth 2 (two) times daily. 04/26/19   Leanor Kail, PA    Current Facility-Administered Medications  Medication Dose Route Frequency Provider Last Rate Last Admin  . albuterol (PROVENTIL) (2.5 MG/3ML) 0.083% nebulizer solution 2.5 mg  2.5 mg Inhalation Q6H PRN Wynetta Fines T, MD      . aspirin EC tablet 81 mg  81 mg Oral Daily Wynetta Fines T, MD   81 mg at 05/02/19 1032  . budesonide (PULMICORT) nebulizer solution 0.25 mg  0.25 mg Nebulization BID Wynetta Fines T, MD   0.25 mg at 05/02/19 0759  . colchicine tablet 0.6 mg  0.6 mg Oral Daily Wynetta Fines T, MD   0.6 mg at 05/02/19 1032  . cyclobenzaprine (FLEXERIL)  tablet 10 mg  10 mg Oral Daily PRN Wynetta Fines T, MD      . docusate sodium (COLACE) capsule 100 mg  100 mg Oral BID Rayburn, Kelly A, PA-C   100 mg at 05/02/19 1032  . heparin injection 5,000 Units  5,000 Units Subcutaneous Q8H Lequita Halt, MD   5,000 Units at 05/02/19 [phone]  . HYDROmorphone (DILAUDID) injection 0.5 mg  0.5 mg Intravenous Q4H PRN Wynetta Fines T, MD      . metoprolol succinate (TOPROL-XL) 24 hr tablet 25 mg  25 mg Oral Daily Wynetta Fines T, MD   25 mg at 05/02/19 1032  . multivitamin with minerals tablet 1 tablet  1 tablet Oral Daily Lequita Halt, MD   1 tablet at 05/02/19 1032  . ondansetron (  ZOFRAN) tablet 4 mg  4 mg Oral Q6H PRN Wynetta Fines T, MD       Or  . ondansetron [facility]) injection 4 mg  4 mg Intravenous Q6H PRN Wynetta Fines T, MD      . oxyCODONE (Oxy IR/ROXICODONE) immediate release tablet 5 mg  5 mg Oral Q4H PRN Wynetta Fines T, MD      . polyethylene glycol (MIRALAX / GLYCOLAX) packet 17 g  17 g Oral Daily PRN Rayburn, Floyce Stakes, PA-C      . ticagrelor (BRILINTA) tablet 90 mg  90 mg Oral BID Wynetta Fines T, MD   90 mg at 05/02/19 1033    Allergies as of 04/30/2019  . (No Known Allergies)    Family History  Problem Relation Age of Onset  . Heart attack Mother 67  . Diabetes Mother   . Heart disease Mother   . Hyperlipidemia Father   . Hypertension Father   . Diabetes Maternal Grandfather   . Heart disease Maternal Grandfather   . Early death Maternal Grandfather   . Heart attack Maternal Grandfather   . Hearing loss Sister   . Hyperlipidemia Sister   . Depression Brother   . Diabetes Brother   . Hyperlipidemia Brother   . Early death Maternal Grandmother   . Heart disease Maternal Grandmother   . Heart attack Maternal Grandmother   . Hearing loss Sister     Social History   Socioeconomic History  . Marital status: Single    Spouse name: Not on file  . Number of children: 1  . Years of education: Not on file  . Highest education level: Bachelor's  degree (e.g., BA, AB, BS)  Occupational History  . Occupation: Music therapist: [employer]    Comment: retired  Tobacco Use  . Smoking status: Light Tobacco Smoker    Packs/day: 0.10    Types: Cigarettes  . Smokeless tobacco: Never Used  . Tobacco comment: almost quit, very rare, 1-2 daily  Substance and Sexual Activity  . Alcohol use: Not Currently    Alcohol/week: 0.0 standard drinks    Comment: Last drink: 10 days ago   . Drug use: Yes    Types: Cocaine    Comment: history of cocaine last use 2014  . Sexual activity: Yes    Partners: Female    Comment: pt. declined condoms  Other Topics Concern  . Not on file  Social History Narrative  . Not on file   Social Determinants of Health   Financial Resource Strain:   . Difficulty of Paying Living Expenses: Not on file  Food Insecurity:   . Worried About Charity fundraiser in the Last Year: Not on file  . Ran Out of Food in the Last Year: Not on file  Transportation Needs:   . Lack of Transportation (Medical): Not on file  . Lack of Transportation (Non-Medical): Not on file  Physical Activity:   . Days of Exercise per Week: Not on file  . Minutes of Exercise per Session: Not on file  Stress:   . Feeling of Stress : Not on file  Social Connections:   . Frequency of Communication with Friends and Family: Not on file  . Frequency of Social Gatherings with Friends and Family: Not on file  . Attends Religious Services: Not on file  . Active Member of Clubs or Organizations: Not on file  . Attends Archivist Meetings: Not on file  .  Marital Status: Not on file  Intimate Partner Violence:   . Fear of Current or Ex-Partner: Not on file  . Emotionally Abused: Not on file  . Physically Abused: Not on file  . Sexually Abused: Not on file    Physical Exam: Vital signs in last 24 hours: Temp:  [98 F (36.7 C)-99.5 F (37.5 C)] 98.7 F (37.1 C) (03/01 0437) Pulse Rate:  [72-75] 72 (03/01  0437) Resp:  [20] 20 (03/01 0437) BP: (93-112)/(56-66) 93/56 (03/01 0437) SpO2:  [96 %-100 %] 96 % (03/01 0759) Last BM Date: 04/30/19 General:  Alert,  Well-developed, well-nourished, pleasant and cooperative in NAD Head: Normocephalic and atraumatic. Eyes: Sclera clear, no icterus.   Lungs: Clear throughout to auscultation.   No wheezes, crackles, or rhonchi. No evident respiratory distress. Heart:  Regular rate and rhythm; no murmurs, clicks, rubs,  or gallops. Abdomen: Soft, nontender, and nondistended. No masses, hepatosplenomegaly noted.  Msk: Symmetrical without gross deformities. Extremities:  Without edema. Neurologic: Alert and coherent;  grossly normal neurologically. Skin: Intact; small patch of ecchymosis on LLQ below belly button.  Significant ecchymosis noted on right inner thigh. Psych:  Alert and cooperative. Normal mood and affect.  Intake/Output from previous day: 02/28 0701 - 03/01 0700 In: 1680 [P.O.:1680] Out: 1625 [Urine:1625] Intake/Output this shift: Total I/O In: 240 [P.O.:240] Out: -   Lab Results: Recent Labs    04/30/19 0733 05/01/19 0325 05/02/19 0348  WBC 11.1* 11.4* 7.4  HGB 14.4 11.9* 13.0  HCT 43.0 35.4* 37.7*  PLT 181 127* 139*   BMET Recent Labs    04/30/19 0733 05/01/19 0325 05/02/19 0348  NA 137 135 136  K 4.0 3.9 4.2  CL 101 101 103  CO2 _0 GLUCOSE 141* 99 101*  BUN _1 CREATININE 1.23 0.97 1.04  CALCIUM 8.9 8.5* 8.7*   LFT Recent Labs    05/02/19 0348  PROT 6.1*  ALBUMIN 2.8*  AST 90*  ALT 203*  ALKPHOS 192*  BILITOT 3.9*   PT/INR No results for input(s): LABPROT, INR in the last 72 hours.  Studies/Results: NM Hepatobiliary Liver Func  Result Date: 04/30/2019 CLINICAL DATA:  Abnormal liver enzymes. EXAM: NUCLEAR MEDICINE HEPATOBILIARY IMAGING TECHNIQUE: Sequential images of the abdomen were obtained out to 60 minutes following intravenous administration of radiopharmaceutical.  RADIOPHARMACEUTICALS:  5.45 mCi Tc-28m Choletec IV COMPARISON:  Ultrasound evaluation of 04/30/2019 FINDINGS: Uptake in the liver occurs over time, no visible bowel or biliary activity. Cardiac activity persists for an extended. Quite notable at 10, 15 and 20 minutes. IMPRESSION: 1. Persistent cardiac activity with liver activity and no sign of biliary or bowel activity, findings favor marked hepatic dysfunction. Biliary obstruction remains within the differential given this pattern of uptake. 2. MRI/MRCP could be considered for further evaluation given above findings, to exclude acute biliary obstruction. Electronically Signed   By: GZetta BillsM.D.   On: 04/30/2019 12:29   MR ABDOMEN MRCP W WO CONTAST  Result Date: 05/02/2019 CLINICAL DATA:  Severe right upper quadrant abdominal pain. EXAM: MRI ABDOMEN WITHOUT AND WITH CONTRAST (INCLUDING MRCP) TECHNIQUE: Multiplanar multisequence MR imaging of the abdomen was performed both before and after the administration of intravenous contrast. Heavily T2-weighted images of the biliary and pancreatic ducts were obtained, and three-dimensional MRCP images were rendered by post processing. CONTRAST:  717mGADAVIST GADOBUTROL 1 MMOL/ML IV SOLN COMPARISON:  CT scan 12/13/2018, ultrasound 04/30/2019 and nuclear medicine hepatic biliary scan 04/30/2019. FINDINGS: Lower chest:  A small left pleural effusion is noted. Minimal bibasilar atelectasis. The heart is mildly enlarged but appears stable. No pericardial effusion. Hepatobiliary: No worrisome hepatic lesions or intrahepatic biliary dilatation. I do not see any morphologic features of cirrhosis. Numerous tiny layering gallstones are noted the gallbladder no gallbladder wall thickening, pericholecystic fluid or pericholecystic inflammatory changes to suggest acute cholecystitis. Normal caliber and course of the common bile duct. No common bile duct stones are identified. Pancreas:  No mass, inflammation or ductal  dilatation. Spleen:  Normal size. No focal lesions. Adrenals/Urinary Tract: The adrenal glands and kidneys are unremarkable. Stomach/Bowel: The stomach, duodenum, visualized small bowel and visualized colon are unremarkable. Vascular/Lymphatic: Aortic atherosclerosis but no aneurysm or dissection. The branch vessels are patent. The major venous structures are patent. No mesenteric or retroperitoneal mass or adenopathy. Other:  No ascites or abdominal hernia. Musculoskeletal: No significant bony findings. IMPRESSION: 1. Cholelithiasis but no MR findings to suggest acute cholecystitis. 2. Normal caliber and course of the common bile duct. No common bile duct stones. 3. No acute abdominal findings, mass lesions or adenopathy. 4. Small left pleural effusion. Electronically Signed   By: Marijo Sanes M.D.   On: 05/02/2019 08:00    Impression: 1. Cholelithiasis.  2.  Elevated liver chemistries, improving, possibly multifactorial (alcohol, shock liver, possible passage of common duct stone)  3.  Recent abdominal pain, now resolved.  Could be due to his MI, acute gastritis or passage of common duct stone, or, conceivably, an attack of biliary colic although the latter would not likely account for the elevated liver chemistries.  4. Hematemesis and melena times 1, the latter since being on aspirin and Brillinta, without changes in Hgb.  Do not suspect ongoing active bleeding; however, it appears patient had a recent minor bleed.  Reassuring that stool was well-formed and that he has had no other episodes.  I suspect this may have been aspirin gastropathy and/or stress gastritis.  Plan: 1.  Monitor abdominal pain, now that it has resolved.  Not a surgical candidate per surgical consult.  Recommend observation.  I think the patient would be a reasonable candidate for an elective cholecystectomy, perhaps a year from now, since there is a good chance that his upper abdominal pain was reflective of biliary colic (he  indicates that he was advised to have his gallbladder out last summer, but never got around to scheduling it)  2. Ordered Protonix 28m BID PO.  Will hold off on EGD at this time, due to recent MI, as well as absence of evidence of acute/ongoing GI bleeding which would force our hand.  Will monitor hemoglobin level over time.  3.  The patient would be a candidate for elective colon cancer screening, which might most appropriately be done by stool testing through his primary physician.   LOS: 2 days   RRussell 05/02/2019, 11:28 AM   Pager [phone] no answer or after 5 PM call [phone]

## 2019-05-02 NOTE — Progress Notes (Signed)
Patient left to mri with nurse at his side, via bed, no s/s of discomfort, pt is alert.

## 2019-05-02 NOTE — Progress Notes (Signed)
PROGRESS NOTE    Hunter Mcmillan  ZOX:[id number] DOB: Feb 24, 1951 DOA: 04/30/2019 PCP: Pearline Cables, MD    Brief Narrative:  69 y.o. male with medical history significant of coronary artery disease, recent STEMI and s/p Stenting x2, history of substance abuse, hypertension, hyperlipidemia and history of hep C  And cirrhosis presented with new onset of abdominal pain. Patient woke up this morning with severe RUQ pain. Feels like a "balloon being blown up inside of me". Pain is constant and severe 7-8/10, cramping like. He remember that he had similar pain every few months recent 2-3 years. He was recommended to schedule an appointment with general surgery but has not done this yet. He reports associated nausea and vomiting, non-bloody, non-biliary. He was discharged from the hospital 4 days ago due to STEMI and two new stents were placed in LCX.He also denies fever, chills, diarrhea, constipation, urinary symptoms. He denies any prior abdominal surgeries. ED Course: RUQ U/S showing gall stone but no Murphy sign and no CBD dilation. HIDA not ruling out Biliary obstruction but more favorable of hepatic dysfunction.  Assessment & Plan:   Active Problems:   Cholelithiases   Cholelithiasis  Symptomatic cholelithiasis -With mild elevation of WBC count, no systemic signs of fever, physical exam and RUQ US showed no Murphy sign. -Seen by General Surgery -LFT's gradually trending down -HIDA noted to be non-diagnostic. Surgery recently recommended GI consult for rising T-bili -MRCP unremarkable -Concerns of biliary cholic as presenting problem  Transaminitis  -No CBD dilation,LFT's now trending down -avoiding hepatotoxic agents -Pt currently tolerating clears without nausea or abd discomfort  Sinus Tachycardia -Likely related to RUQ pain -Remains stable at present  Positive troponin -S/P recent STEMI and Cath and Stenting,  -No chest pain or sob -Outpt f/u per  below  Chronic HepC with Cirrhosis -Pt s/p treatment with Harvoni -No S/S of acute issue, outpt GI F/U. -Hep C with undetectable viral load, reviewed  Recent STEMI -Continue Duo-antiplt regimen -no chest pain at this time -cont with beta blocker, aspirin -Pt was supposed to have f/u apponitment scheduled for today. Discussed with on-call Cardiologist who recommended rescheduling appt for later  Chronic systolic CHF -Clinical compensated -cont beta blocker as tolerated  COPD No S/S of acute exacerbation. -On minimal O2 support  HTN -continue home meds  Prolonged QTCs -Cont monitor for now -Stable at present  Black stool -Per GI, concern for possible aspirin gastropathy and/or stress gastritis, plan for protonix bid  DVT prophylaxis: Heparin subq Code Status: Full Family Communication: Pt in room, family not at bedside Disposition Plan: from home, d/c home when cleared by both general surgery and GI  Consultants:   General Surgery  GI  Procedures:     Antimicrobials: Anti-infectives (From admission, onward)   None      Subjective: Tolerating diet thus far. Reported black stools  Objective: Vitals:   05/01/19 2033 05/02/19 0437 05/02/19 0759 05/02/19 1440  BP: 101/61 (!) 93/56  117/76  Pulse: 75 72  78  Resp: 20 20  17   Temp: 99.5 F (37.5 C) 98.7 F (37.1 C)  98.9 F (37.2 C)  TempSrc: Oral Oral  Oral  SpO2: 97% 96% 96% 98%  Weight:      Height:        Intake/Output Summary (Last 24 hours) at 05/02/2019 1757 Last data filed at 05/02/2019 1000 Gross per 24 hour  Intake 240 ml  Output 200 ml  Net 40 ml   07/02/2019  04/30/19 0718 04/30/19 1444  Weight: 67.4 kg 67.8 kg    Examination: General exam: Awake, laying in bed, in nad Respiratory system: Normal respiratory effort, no wheezing Cardiovascular system: regular rate, s1, s2 Gastrointestinal system: Soft, nondistended, positive BS Central nervous system: CN2-12 grossly  intact, strength intact Extremities: Perfused, no clubbing Skin: Normal skin turgor, no notable skin lesions seen Psychiatry: Mood normal // no visual hallucinations   Data Reviewed: I have personally reviewed following labs and imaging studies  CBC: Recent Labs  Lab 04/30/19 0733 05/01/19 0325 05/02/19 0348  WBC 11.1* 11.4* 7.4  NEUTROABS 10.2*  --   --   HGB 14.4 11.9* 13.0  HCT 43.0 35.4* 37.7*  MCV 98.6 97.5 96.7  PLT 181 127* 139*   Basic Metabolic Panel: Recent Labs  Lab 04/26/19 0314 04/30/19 0733 05/01/19 0325 05/02/19 0348  NA 134* 137 135 136  K 3.7 4.0 3.9 4.2  CL 101 101 101 103  CO2 23 25 25 23   GLUCOSE 105* 141* 99 101*  BUN 22 16 15 10   CREATININE 1.12 1.23 0.97 1.04  CALCIUM 8.4* 8.9 8.5* 8.7*   GFR: Estimated Creatinine Clearance: 64.3 mL/min (by C-G formula based on SCr of 1.04 mg/dL). Liver Function Tests: Recent Labs  Lab 04/30/19 0733 05/01/19 0325 05/02/19 0348  AST 232* 127* 90*  ALT 364* 276* 203*  ALKPHOS 200* 165* 192*  BILITOT 1.9* 3.3* 3.9*  PROT 6.9 5.7* 6.1*  ALBUMIN 3.3* 2.7* 2.8*   Recent Labs  Lab 04/30/19 0733  LIPASE 26   No results for input(s): AMMONIA in the last 168 hours. Coagulation Profile: No results for input(s): INR, PROTIME in the last 168 hours. Cardiac Enzymes: No results for input(s): CKTOTAL, CKMB, CKMBINDEX, TROPONINI in the last 168 hours. BNP (last 3 results) No results for input(s): PROBNP in the last 8760 hours. HbA1C: No results for input(s): HGBA1C in the last 72 hours. CBG: No results for input(s): GLUCAP in the last 168 hours. Lipid Profile: No results for input(s): CHOL, HDL, LDLCALC, TRIG, CHOLHDL, LDLDIRECT in the last 72 hours. Thyroid Function Tests: No results for input(s): TSH, T4TOTAL, FREET4, T3FREE, THYROIDAB in the last 72 hours. Anemia Panel: No results for input(s): VITAMINB12, FOLATE, FERRITIN, TIBC, IRON, RETICCTPCT in the last 72 hours. Sepsis Labs: Recent Labs  Lab  04/30/19 1538 04/30/19 1919  LATICACIDVEN 1.2 1.6    Recent Results (from the past 240 hour(s))  Respiratory Panel by RT PCR (Flu A&B, Covid) - Nasopharyngeal Swab     Status: None   Collection Time: 04/24/19  8:09 AM   Specimen: Nasopharyngeal Swab  Result Value Ref Range Status   SARS Coronavirus 2 by RT PCR NEGATIVE NEGATIVE Final    Comment: (NOTE) SARS-CoV-2 target nucleic acids are NOT DETECTED. The SARS-CoV-2 RNA is generally detectable in upper respiratoy specimens during the acute phase of infection. The lowest concentration of SARS-CoV-2 viral copies this assay can detect is 131 copies/mL. A negative result does not preclude SARS-Cov-2 infection and should not be used as the sole basis for treatment or other patient management decisions. A negative result may occur with  improper specimen collection/handling, submission of specimen other than nasopharyngeal swab, presence of viral mutation(s) within the areas targeted by this assay, and inadequate number of viral copies (<131 copies/mL). A negative result must be combined with clinical observations, patient history, and epidemiological information. The expected result is Negative. Fact Sheet for Patients:  05/02/19 Fact Sheet for Healthcare Providers:  04/26/19 This  test is not yet ap proved or cleared by the [place] and  has been authorized for detection and/or diagnosis of SARS-CoV-2 by FDA under an Emergency Use Authorization (EUA). This EUA will remain  in effect (meaning this test can be used) for the duration of the COVID-19 declaration under Section 564(b)(1) of the Act, 21 U.S.C. section 360bbb-3(b)(1), unless the authorization is terminated or revoked sooner.    Influenza A by PCR NEGATIVE NEGATIVE Final   Influenza B by PCR NEGATIVE NEGATIVE Final    Comment: (NOTE) The Xpert Xpress SARS-CoV-2/FLU/RSV assay is intended as an aid in   the diagnosis of influenza from Nasopharyngeal swab specimens and  should not be used as a sole basis for treatment. Nasal washings and  aspirates are unacceptable for Xpert Xpress SARS-CoV-2/FLU/RSV  testing. Fact Sheet for Patients: PinkCheek.be Fact Sheet for Healthcare Providers: GravelBags.it This test is not yet approved or cleared by the [place] FDA and  has been authorized for detection and/or diagnosis of SARS-CoV-2 by  FDA under an Emergency Use Authorization (EUA). This EUA will remain  in effect (meaning this test can be used) for the duration of the  Covid-19 declaration under Section 564(b)(1) of the Act, 21  U.S.C. section 360bbb-3(b)(1), unless the authorization is  terminated or revoked. Performed at [facility], [place] [address], [place], [place] [address]   MRSA PCR Screening     Status: None   Collection Time: 04/24/19 10:27 AM   Specimen: Nasal Mucosa; Nasopharyngeal  Result Value Ref Range Status   MRSA by PCR NEGATIVE NEGATIVE Final    Comment:        The GeneXpert MRSA Assay (FDA approved for NASAL specimens only), is one component of a comprehensive MRSA colonization surveillance program. It is not intended to diagnose MRSA infection nor to guide or monitor treatment for MRSA infections. Performed at [facility], [place] [address], [place], [place] [address]   SARS CORONAVIRUS 2 (TAT 6-24 HRS) Nasopharyngeal Nasopharyngeal Swab     Status: None   Collection Time: 04/30/19  1:39 PM   Specimen: Nasopharyngeal Swab  Result Value Ref Range Status   SARS Coronavirus 2 NEGATIVE NEGATIVE Final    Comment: (NOTE) SARS-CoV-2 target nucleic acids are NOT DETECTED. The SARS-CoV-2 RNA is generally detectable in upper and lower respiratory specimens during the acute phase of infection. Negative results do not preclude SARS-CoV-2 infection, do not rule out co-infections with other  pathogens, and should not be used as the sole basis for treatment or other patient management decisions. Negative results must be combined with clinical observations, patient history, and epidemiological information. The expected result is Negative. Fact Sheet for Patients: SugarRoll.be Fact Sheet for Healthcare Providers: https://www.woods-mathews.com/ This test is not yet approved or cleared by the [place] FDA and  has been authorized for detection and/or diagnosis of SARS-CoV-2 by FDA under an Emergency Use Authorization (EUA). This EUA will remain  in effect (meaning this test can be used) for the duration of the COVID-19 declaration under Section 56 4(b)(1) of the Act, 21 U.S.C. section 360bbb-3(b)(1), unless the authorization is terminated or revoked sooner. Performed at [facility], [place] [address], [place], [place] [address]      Radiology Studies: MR ABDOMEN MRCP W WO CONTAST  Result Date: 05/02/2019 CLINICAL DATA:  Severe right upper quadrant abdominal pain. EXAM: MRI ABDOMEN WITHOUT AND WITH CONTRAST (INCLUDING MRCP) TECHNIQUE: Multiplanar multisequence MR imaging of the abdomen was performed  both before and after the administration of intravenous contrast. Heavily T2-weighted images of the biliary and pancreatic ducts were obtained, and three-dimensional MRCP images were rendered by post processing. CONTRAST:  25mL GADAVIST GADOBUTROL 1 MMOL/ML IV SOLN COMPARISON:  CT scan 12/13/2018, ultrasound 04/30/2019 and nuclear medicine hepatic biliary scan 04/30/2019. FINDINGS: Lower chest: A small left pleural effusion is noted. Minimal bibasilar atelectasis. The heart is mildly enlarged but appears stable. No pericardial effusion. Hepatobiliary: No worrisome hepatic lesions or intrahepatic biliary dilatation. I do not see any morphologic features of cirrhosis. Numerous tiny layering gallstones are noted the gallbladder no gallbladder wall  thickening, pericholecystic fluid or pericholecystic inflammatory changes to suggest acute cholecystitis. Normal caliber and course of the common bile duct. No common bile duct stones are identified. Pancreas:  No mass, inflammation or ductal dilatation. Spleen:  Normal size. No focal lesions. Adrenals/Urinary Tract: The adrenal glands and kidneys are unremarkable. Stomach/Bowel: The stomach, duodenum, visualized small bowel and visualized colon are unremarkable. Vascular/Lymphatic: Aortic atherosclerosis but no aneurysm or dissection. The branch vessels are patent. The major venous structures are patent. No mesenteric or retroperitoneal mass or adenopathy. Other:  No ascites or abdominal hernia. Musculoskeletal: No significant bony findings. IMPRESSION: 1. Cholelithiasis but no MR findings to suggest acute cholecystitis. 2. Normal caliber and course of the common bile duct. No common bile duct stones. 3. No acute abdominal findings, mass lesions or adenopathy. 4. Small left pleural effusion. Electronically Signed   By: Rudie Meyer M.D.   On: 05/02/2019 08:00    Scheduled Meds: . aspirin EC  81 mg Oral Daily  . budesonide (PULMICORT) nebulizer solution  0.25 mg Nebulization BID  . colchicine  0.6 mg Oral Daily  . docusate sodium  100 mg Oral BID  . heparin  5,000 Units Subcutaneous Q8H  . metoprolol succinate  25 mg Oral Daily  . multivitamin with minerals  1 tablet Oral Daily  . pantoprazole  40 mg Oral BID AC  . ticagrelor  90 mg Oral BID   Continuous Infusions:   LOS: 2 days   Rickey Barbara, MD [facility] Pager On Amion  If 7PM-7AM, please contact night-coverage 05/02/2019, 5:57 PM

## 2019-05-02 NOTE — Progress Notes (Signed)
[facility] Progress Note     Subjective: Patient denies abdominal pain, nausea, vomiting. Reports he is not passing gas and has not had a BM in the last 1-2 days. Concerned about his cardiology appointment at [facility] today, for which he had arranged transportation.   Review of Systems  Constitutional: Negative for chills and fever.  Respiratory: Negative for shortness of breath.   Cardiovascular: Negative for chest pain.  Gastrointestinal: Positive for constipation. Negative for abdominal pain, nausea and vomiting.  Genitourinary: Negative for dysuria, frequency and urgency.     Objective: Vital signs in last 24 hours: Temp:  [98 F (36.7 C)-99.5 F (37.5 C)] 98.7 F (37.1 C) (03/01 0437) Pulse Rate:  [72-75] 72 (03/01 0437) Resp:  [20] 20 (03/01 0437) BP: (93-112)/(56-66) 93/56 (03/01 0437) SpO2:  [95 %-100 %] 96 % (03/01 0759) Last BM Date: 04/30/19  Intake/Output from previous day: 02/28 0701 - 03/01 0700 In: 1680 [P.O.:1680] Out: 1625 [Urine:1625] Intake/Output this shift: No intake/output data recorded.  PE: General: pleasant, WD, WN white male who is laying in bed in NAD HEENT:  Sclera are anicteric.  PERRL.  Ears and nose without any masses or lesions.  Mouth is pink and moist Heart: regular, rate, and rhythm.  Normal s1,s2. No obvious murmurs, gallops, or rubs noted.  Palpable radial and pedal pulses bilaterally Lungs: CTAB, no wheezes, rhonchi, or rales noted.  Respiratory effort nonlabored Abd: soft, NT, ND, +BS, no masses, hernias, or organomegaly MS: all 4 extremities are symmetrical with no cyanosis, clubbing, or edema. Skin: warm and dry with no masses, lesions, or rashes Neuro: Cranial nerves 2-12 grossly intact, sensation intact Psych: A&Ox3 with an appropriate affect.   Lab Results:  Recent Labs    05/01/19 0325 05/02/19 0348  WBC 11.4* 7.4  HGB 11.9* 13.0  HCT 35.4* 37.7*  PLT 127* 139*   BMET Recent Labs    05/01/19 0325  05/02/19 0348  NA 135 136  K 3.9 4.2  CL 101 103  CO2 25 23  GLUCOSE 99 101*  BUN 15 10  CREATININE 0.97 1.04  CALCIUM 8.5* 8.7*   PT/INR No results for input(s): LABPROT, INR in the last 72 hours. CMP     Component Value Date/Time   NA 136 05/02/2019 0348   K 4.2 05/02/2019 0348   CL 103 05/02/2019 0348   CO2 23 05/02/2019 0348   GLUCOSE 101 (H) 05/02/2019 0348   BUN 10 05/02/2019 0348   CREATININE 1.04 05/02/2019 0348   CALCIUM 8.7 (L) 05/02/2019 0348   PROT 6.1 (L) 05/02/2019 0348   ALBUMIN 2.8 (L) 05/02/2019 0348   AST 90 (H) 05/02/2019 0348   ALT 203 (H) 05/02/2019 0348   ALKPHOS 192 (H) 05/02/2019 0348   BILITOT 3.9 (H) 05/02/2019 0348   GFRNONAA >60 05/02/2019 0348   GFRAA >60 05/02/2019 0348   Lipase     Component Value Date/Time   LIPASE 26 04/30/2019 0733       Studies/Results: NM Hepatobiliary Liver Func  Result Date: 04/30/2019 CLINICAL DATA:  Abnormal liver enzymes. EXAM: NUCLEAR MEDICINE HEPATOBILIARY IMAGING TECHNIQUE: Sequential images of the abdomen were obtained out to 60 minutes following intravenous administration of radiopharmaceutical. RADIOPHARMACEUTICALS:  5.45 mCi Tc-67m  Choletec IV COMPARISON:  Ultrasound evaluation of 04/30/2019 FINDINGS: Uptake in the liver occurs over time, no visible bowel or biliary activity. Cardiac activity persists for an extended. Quite notable at 10, 15 and 20 minutes. IMPRESSION: 1. Persistent cardiac activity with liver activity  and no sign of biliary or bowel activity, findings favor marked hepatic dysfunction. Biliary obstruction remains within the differential given this pattern of uptake. 2. MRI/MRCP could be considered for further evaluation given above findings, to exclude acute biliary obstruction. Electronically Signed   By: Donzetta Kohut M.D.   On: 04/30/2019 12:29   US Abdomen Limited RUQ  Result Date: 04/30/2019 CLINICAL DATA:  69 year old male with acute RIGHT UPPER abdominal pain for 1 day. EXAM:  ULTRASOUND ABDOMEN LIMITED RIGHT UPPER QUADRANT COMPARISON:  10/12/2018 ultrasound FINDINGS: Gallbladder: Multiple tiny mobile gallstones are noted. There is no evidence of gallbladder wall thickening, pericholecystic fluid or sonographic Murphy sign. Common bile duct: Diameter: 6 mm.  No intrahepatic or extrahepatic biliary dilatation. Liver: No focal lesion identified. Within normal limits in parenchymal echogenicity. Portal vein is patent on color Doppler imaging with normal direction of blood flow towards the liver. Other: A small RIGHT pleural effusion is identified. IMPRESSION: 1. Cholelithiasis without other sonographic evidence of acute cholecystitis. No biliary dilatation. 2. Small RIGHT pleural effusion. Electronically Signed   By: Harmon Pier M.D.   On: 04/30/2019 08:22    Anti-infectives: Anti-infectives (From admission, onward)   None       Assessment/Plan Recent MI with ongoing elevated troponins  - Troponin 1335 >> 1043 PTCA with stenting 04/24/2019 Dr. Peter [place] Anticoagulation aspirin/Brilinta Hx alcohol abuse Anemia/thrombocytopenia Hx depression  Right upper quadrant abdominal pain  Hx hepatitis C/chronic steatosis - AST/ALT 232/364 >> 127/276 >> 90/203 - T bilirubin 1.9>> 3.3 >> 3.9 - HIDA scan non-diagnostic, which is not surprising in setting of cirrhosis - pain has resolved - MRCP pending, GI to see - surgery risk is prohibitive in setting of cirrhosis, recent MI, and anticoagulation  FEN: FLD ID: None DVT: SQ Heparin/Brilinta/aspirin Follow-up: TBD  Plan: AST/ALT trending down. Tbili still trending upward, GI to see today. MRCP done, read pending. No indication for surgical intervention at this time.   LOS: 2 days    Wells Guiles , [facility] 05/02/2019, 8:02 AM Please see Amion for pager number during day hours 7:00am-4:30pm

## 2019-05-03 LAB — CBC
HCT: 39.4 % (ref 39.0–52.0)
Hemoglobin: 13.5 g/dL (ref 13.0–17.0)
MCH: 33.1 pg (ref 26.0–34.0)
MCHC: 34.3 g/dL (ref 30.0–36.0)
MCV: 96.6 fL (ref 80.0–100.0)
Platelets: 169 10*3/uL (ref 150–400)
RBC: 4.08 MIL/uL — ABNORMAL LOW (ref 4.22–5.81)
RDW: 13.3 % (ref 11.5–15.5)
WBC: 6.8 10*3/uL (ref 4.0–10.5)
nRBC: 0 % (ref 0.0–0.2)

## 2019-05-03 LAB — COMPREHENSIVE METABOLIC PANEL
ALT: 167 U/L — ABNORMAL HIGH (ref 0–44)
AST: 67 U/L — ABNORMAL HIGH (ref 15–41)
Albumin: 2.8 g/dL — ABNORMAL LOW (ref 3.5–5.0)
Alkaline Phosphatase: 261 U/L — ABNORMAL HIGH (ref 38–126)
Anion gap: 8 (ref 5–15)
BUN: 10 mg/dL (ref 8–23)
CO2: 22 mmol/L (ref 22–32)
Calcium: 8.8 mg/dL — ABNORMAL LOW (ref 8.9–10.3)
Chloride: 103 mmol/L (ref 98–111)
Creatinine, Ser: 0.86 mg/dL (ref 0.61–1.24)
GFR calc Af Amer: >60 mL/min (ref >60)
GFR calc non Af Amer: >60 mL/min (ref >60)
Glucose, Bld: 112 mg/dL — ABNORMAL HIGH (ref 70–99)
Potassium: 3.6 mmol/L (ref 3.5–5.1)
Sodium: 133 mmol/L — ABNORMAL LOW (ref 135–145)
Total Bilirubin: 2.6 mg/dL — ABNORMAL HIGH (ref 0.3–1.2)
Total Protein: 6.5 g/dL (ref 6.5–8.1)

## 2019-05-03 NOTE — Progress Notes (Signed)
AVS given and reviewed with pt. Medications discussed. All questions answered to satisfaction. Pt verbalized understanding of information given. Pt to be escorted off the unit with all belongings via wheelchair by staff member.  

## 2019-05-03 NOTE — Plan of Care (Signed)
  Problem: Education: Goal: Knowledge of General Education information will improve Description Including pain rating scale, medication(s)/side effects and non-pharmacologic comfort measures Outcome: Progressing   

## 2019-05-03 NOTE — Discharge Summary (Signed)
Physician Discharge Summary  Jaquell Seddon ZOX:[id number] DOB: 1950-07-03 DOA: 04/30/2019  PCP: Pearline Cables, MD  Admit date: 04/30/2019 Discharge date: 05/03/2019  Admitted From: Home Disposition:  Home  Recommendations for Outpatient Follow-up:  1. Follow up with PCP in 1-2 weeks 2. Follow up with Cardiology as scheduled 3. Follow up with General Surgery as scheduled  Discharge Condition:Improved CODE STATUS:Full Diet recommendation: Regular   Brief/Interim Summary: 69 y.o.malewith medical history significant ofcoronary artery disease, recent STEMI and s/p Stenting x2, history of substance abuse, hypertension, hyperlipidemia and history of hep CAnd cirrhosis presentedwith new onset ofabdominal pain. Patientwoke up thismorning with severe RUQ pain. Feels like a "balloon being blown up inside of me". Pain is constant and severe7-8/10, cramping like. Heremember that he had similarpain every few months recent 2-3 years. He was recommended to schedule an appointment with general surgery but has not done this yet. He reports associated nausea and vomiting, non-bloody, non-biliary. He was discharged from the hospital 4 days ago due to [facility] two new stents were placed in LCX.He also denies fever, chills, diarrhea, constipation, urinary symptoms. He denies any prior abdominal surgeries. ED Course:RUQ U/S showing gall stone but no Murphy sign and no CBD dilation. HIDA not ruling out Biliary obstruction but more favorable of hepatic dysfunction.  Discharge Diagnoses:  Active Problems:   Cholelithiases   Cholelithiasis  Symptomatic cholelithiasis -With mild elevation of WBC count, no systemic signs of fever, physical exam and RUQ US showed no Murphy sign. -Seen by General Surgery -LFT's gradually trending down -HIDA noted to be non-diagnostic. Surgery recently recommended GI consult for rising T-bili -MRCP unremarkable -Concerns of biliary cholic as presenting  problem -Follow up as outpatient  Transaminitis  -No CBD dilation,LFT's now trending down -avoiding hepatotoxic agents -Tolerated advanced diet  Sinus Tachycardia -Likely related to RUQ pain -Remains stable at present  Positive troponin -S/P recent STEMI and Cath and Stenting,  -No chest pain or sob -Outpt f/u per below  Chronic HepC with Cirrhosis -Pt s/p treatment with Harvoni -No S/S of acute issue, outpt GI F/U. -Hep C with undetectable viral load, reviewed  Recent STEMI -Continue Duo-antiplt regimen -no chest pain at this time -cont with beta blocker, aspirin -Pt was supposed to have f/u apponitment scheduled for today. Discussed with on-call Cardiologist who recommended rescheduling appt for later  Chronic systolic CHF -Clinical compensated -cont beta blocker as tolerated  COPD No S/S of acute exacerbation. -On minimal O2 support  HTN -continue home meds  Prolonged QTCs -Cont monitor for now -Stable at present  Black stool -Per GI, concern for possible aspirin gastropathy and/or stress gastritis, plan for protonix bid -Hgb remained stable. Improved. GI signed off  Discharge Instructions   Allergies as of 05/03/2019   No Known Allergies     Medication List    TAKE these medications   albuterol 108 (90 Base) MCG/ACT inhaler Commonly known as: VENTOLIN HFA Inhale 2 puffs into the lungs every 6 (six) hours as needed for wheezing or shortness of breath.   aspirin EC 81 MG tablet Take 81 mg by mouth daily.   atorvastatin 80 MG tablet Commonly known as: LIPITOR Take 1 tablet (80 mg total) by mouth daily at 6 PM.   colchicine 0.6 MG tablet Take 1 tablet (0.6 mg total) by mouth daily.   cyclobenzaprine 10 MG tablet Commonly known as: FLEXERIL TAKE 1 TABLET BY MOUTH TWICE DAILY AS NEEDED FOR MUSCLE SPASM What changed: See the new instructions.   fluticasone 110  MCG/ACT inhaler Commonly known as: FLOVENT HFA Inhale 2 puffs into the  lungs 2 (two) times daily.   metoprolol succinate 25 MG 24 hr tablet Commonly known as: TOPROL-XL Take 1 tablet (25 mg total) by mouth daily.   multivitamin with minerals Tabs tablet Take 1 tablet by mouth daily.   nitroGLYCERIN 0.4 MG SL tablet Commonly known as: NITROSTAT Place 1 tablet (0.4 mg total) under the tongue every 5 (five) minutes as needed for chest pain.   ticagrelor 90 MG Tabs tablet Commonly known as: BRILINTA Take 1 tablet (90 mg total) by mouth 2 (two) times daily.      Follow-up Information    Copland, Gwenlyn Found, MD. Schedule an appointment as soon as possible for a visit in 2 week(s).   Specialty: Family Medicine Contact information: [address] [place] [address] [phone]        [place], Peter M, MD .   Specialty: Cardiology Contact information: [address] [place] [address] [phone]        [facility]. Schedule an appointment as soon as possible for a visit.   Contact information: [address] [place] [address]         No Known Allergies  Consultations:  GI   General Surgery  Procedures/Studies: NM Hepatobiliary Liver Func  Result Date: 04/30/2019 CLINICAL DATA:  Abnormal liver enzymes. EXAM: NUCLEAR MEDICINE HEPATOBILIARY IMAGING TECHNIQUE: Sequential images of the abdomen were obtained out to 60 minutes following intravenous administration of radiopharmaceutical. RADIOPHARMACEUTICALS:  5.45 mCi Tc-58m  Choletec IV COMPARISON:  Ultrasound evaluation of 04/30/2019 FINDINGS: Uptake in the liver occurs over time, no visible bowel or biliary activity. Cardiac activity persists for an extended. Quite notable at 10, 15 and 20 minutes. IMPRESSION: 1. Persistent cardiac activity with liver activity and no sign of biliary or bowel activity, findings favor marked hepatic dysfunction. Biliary obstruction remains within the differential  given this pattern of uptake. 2. MRI/MRCP could be considered for further evaluation given above findings, to exclude acute biliary obstruction. Electronically Signed   By: Donzetta Kohut M.D.   On: 04/30/2019 12:29   CARDIAC CATHETERIZATION  Result Date: 04/24/2019  Prox Cx lesion is 90% stenosed.  Previously placed Prox RCA to Mid RCA stent (unknown type) is widely patent.  1. Continued excellent patency of the RCA stents. The LCx stenosis is chronic and this is a small vessel. Plan: continue medical management. Will remove femoral sheath manually. Will add colchicine for possible pericardial component to pain. Prn analgesics.   CARDIAC CATHETERIZATION  Result Date: 04/24/2019  Suezanne Jacquet LM lesion is 25% stenosed.  Mid LAD lesion is 25% stenosed.  Prox Cx lesion is 90% stenosed.  Ost RCA to Mid RCA lesion is 100% stenosed.  A drug-eluting stent was successfully placed using a STENT RESOLUTE ONYX 3.5X38.  A drug-eluting stent was successfully placed using a STENT RESOLUTE ONYX 3.5X30.  Post intervention, there is a 0% residual stenosis.  LV end diastolic pressure is normal.  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 Severe 2 vessel CAD    - culprit lesion is 100% occlusion of the proximal RCA with heavy thrombus    - 90% small LCx 3. Normal LVEDP 4. Successful PCI of the RCA with thrombectomy and stenting with DES x 2. Plan: DAPT for at least one year. IV Aggrastat for 18 hours. Start beta blocker and high dose statin.  Assess LV function by Echo.   DG Chest Port 1 View  Result Date: 04/24/2019 CLINICAL DATA:  Chest pain EXAM: PORTABLE CHEST 1 VIEW COMPARISON:  07/30/2017 chest radiograph. FINDINGS: In Stable cardiomediastinal silhouette with normal heart size. No pneumothorax. No pleural effusion. Lungs appear clear, with no acute consolidative airspace disease and no pulmonary edema. IMPRESSION: No active disease. Electronically Signed   By: Delbert Phenix M.D.   On: 04/24/2019 08:24   MR  ABDOMEN MRCP W WO CONTAST  Result Date: 05/02/2019 CLINICAL DATA:  Severe right upper quadrant abdominal pain. EXAM: MRI ABDOMEN WITHOUT AND WITH CONTRAST (INCLUDING MRCP) TECHNIQUE: Multiplanar multisequence MR imaging of the abdomen was performed both before and after the administration of intravenous contrast. Heavily T2-weighted images of the biliary and pancreatic ducts were obtained, and three-dimensional MRCP images were rendered by post processing. CONTRAST:  7mL GADAVIST GADOBUTROL 1 MMOL/ML IV SOLN COMPARISON:  CT scan 12/13/2018, ultrasound 04/30/2019 and nuclear medicine hepatic biliary scan 04/30/2019. FINDINGS: Lower chest: A small left pleural effusion is noted. Minimal bibasilar atelectasis. The heart is mildly enlarged but appears stable. No pericardial effusion. Hepatobiliary: No worrisome hepatic lesions or intrahepatic biliary dilatation. I do not see any morphologic features of cirrhosis. Numerous tiny layering gallstones are noted the gallbladder no gallbladder wall thickening, pericholecystic fluid or pericholecystic inflammatory changes to suggest acute cholecystitis. Normal caliber and course of the common bile duct. No common bile duct stones are identified. Pancreas:  No mass, inflammation or ductal dilatation. Spleen:  Normal size. No focal lesions. Adrenals/Urinary Tract: The adrenal glands and kidneys are unremarkable. Stomach/Bowel: The stomach, duodenum, visualized small bowel and visualized colon are unremarkable. Vascular/Lymphatic: Aortic atherosclerosis but no aneurysm or dissection. The branch vessels are patent. The major venous structures are patent. No mesenteric or retroperitoneal mass or adenopathy. Other:  No ascites or abdominal hernia. Musculoskeletal: No significant bony findings. IMPRESSION: 1. Cholelithiasis but no MR findings to suggest acute cholecystitis. 2. Normal caliber and course of the common bile duct. No common bile duct stones. 3. No acute abdominal  findings, mass lesions or adenopathy. 4. Small left pleural effusion. Electronically Signed   By: Rudie Meyer M.D.   On: 05/02/2019 08:00   ECHOCARDIOGRAM COMPLETE  Result Date: 04/24/2019    ECHOCARDIOGRAM REPORT   Patient Name:   CONNELLY NETTERVILLE Date of Exam: 04/24/2019 Medical Rec #:  [id number]        Height:       71.0 in Accession #:    [id number]       Weight:       150.0 lb Date of Birth:  June 01, 1950        BSA:          1.866 m Patient Age:    68 years         BP:           110/63 mmHg Patient Gender: M                HR:           65 bpm. Exam Location:  Inpatient Procedure: 2D Echo, Cardiac Doppler and Color Doppler Indications:    CAD  History:        Patient has prior history of Echocardiogram examinations, most                 recent 01/06/2012.  Sonographer:    Roosvelt Maser RDCS Referring Phys: [phone] PETER M [place] IMPRESSIONS  1. Left ventricular ejection  fraction, by estimation, is 40 to 45%. The left ventricle has mildly decreased function. The left ventricle demonstrates regional wall motion abnormalities (see scoring diagram/findings for description). There is mild left ventricular hypertrophy. Left ventricular diastolic parameters are consistent with Grade I diastolic dysfunction (impaired relaxation).  2. Right ventricular systolic function is severely reduced. The right ventricular size is mildly enlarged. There is mildly elevated pulmonary artery systolic pressure.  3. The mitral valve is grossly normal. Trivial mitral valve regurgitation.  4. Tricuspid valve regurgitation is mild to moderate.  5. The aortic valve has an indeterminant number of cusps. Aortic valve regurgitation is not visualized.  6. The inferior vena cava is dilated in size with <50% respiratory variability, suggesting right atrial pressure of 15 mmHg. FINDINGS  Left Ventricle: Left ventricular ejection fraction, by estimation, is 40 to 45%. The left ventricle has mildly decreased function. The left ventricle demonstrates  regional wall motion abnormalities. The left ventricular internal cavity size was normal in size. There is mild left ventricular hypertrophy. Left ventricular diastolic parameters are consistent with Grade I diastolic dysfunction (impaired relaxation).  LV Wall Scoring: The entire inferior wall, basal inferolateral segment, and basal anterolateral segment are hypokinetic. The entire anterior wall, mid and distal lateral wall, entire septum, mid anterolateral segment, and apex are normal. Right Ventricle: The right ventricular size is mildly enlarged. No increase in right ventricular wall thickness. Right ventricular systolic function is severely reduced. There is mildly elevated pulmonary artery systolic pressure. The tricuspid regurgitant velocity is 2.09 m/s, and with an assumed right atrial pressure of 15 mmHg, the estimated right ventricular systolic pressure is 32.5 mmHg. Left Atrium: Left atrial size was normal in size. Right Atrium: Right atrial size was normal in size. Pericardium: There is no evidence of pericardial effusion. Mitral Valve: The mitral valve is grossly normal. Trivial mitral valve regurgitation. Tricuspid Valve: The tricuspid valve is grossly normal. Tricuspid valve regurgitation is mild to moderate. Aortic Valve: The aortic valve has an indeterminant number of cusps. Aortic valve regurgitation is not visualized. There is mild calcification of the aortic valve. Pulmonic Valve: The pulmonic valve was not well visualized. Pulmonic valve regurgitation is not visualized. Aorta: The aortic root is normal in size and structure. Venous: The inferior vena cava is dilated in size with less than 50% respiratory variability, suggesting right atrial pressure of 15 mmHg. IAS/Shunts: No atrial level shunt detected by color flow Doppler.  LEFT VENTRICLE PLAX 2D LVIDd:         3.40 cm     Diastology LVIDs:         2.40 cm     LV e' lateral:   8.38 cm/s LV PW:         1.30 cm     LV E/e' lateral: 5.1 LV IVS:         1.10 cm     LV e' medial:    5.18 cm/s LVOT diam:     2.00 cm     LV E/e' medial:  8.3 LV SV:         41.47 ml LV SV Index:   22.22 LVOT Area:     3.14 cm  LV Volumes (MOD) LV vol d, MOD A2C: 55.3 ml LV vol d, MOD A4C: 77.5 ml LV vol s, MOD A2C: 35.2 ml LV vol s, MOD A4C: 41.2 ml LV SV MOD A2C:     20.1 ml LV SV MOD A4C:     77.5 ml LV SV MOD  BP:      26.9 ml RIGHT VENTRICLE            IVC RV Basal diam:  4.60 cm    IVC diam: 2.20 cm RV Mid diam:    4.20 cm RV S prime:     5.63 cm/s TAPSE (M-mode): 0.7 cm LEFT ATRIUM             Index       RIGHT ATRIUM           Index LA diam:        3.10 cm 1.66 cm/m  RA Area:     14.20 cm LA Vol (A2C):   52.0 ml 27.87 ml/m RA Volume:   37.80 ml  20.26 ml/m LA Vol (A4C):   41.6 ml 22.29 ml/m LA Biplane Vol: 50.5 ml 27.06 ml/m  AORTIC VALVE LVOT Vmax:   79.00 cm/s LVOT Vmean:  44.700 cm/s LVOT VTI:    0.132 m  AORTA Ao Root diam: 2.90 cm MITRAL VALVE               TRICUSPID VALVE MV Area (PHT): 2.32 cm    TR Peak grad:   17.5 mmHg MV Decel Time: 327 msec    TR Vmax:        209.00 cm/s MV E velocity: 42.80 cm/s MV A velocity: 58.70 cm/s  SHUNTS MV E/A ratio:  0.73        Systemic VTI:  0.13 m                            Systemic Diam: 2.00 cm Nona Dell MD Electronically signed by Nona Dell MD Signature Date/Time: 04/24/2019/5:13:12 PM    Final    US Abdomen Limited RUQ  Result Date: 04/30/2019 CLINICAL DATA:  69 year old male with acute RIGHT UPPER abdominal pain for 1 day. EXAM: ULTRASOUND ABDOMEN LIMITED RIGHT UPPER QUADRANT COMPARISON:  10/12/2018 ultrasound FINDINGS: Gallbladder: Multiple tiny mobile gallstones are noted. There is no evidence of gallbladder wall thickening, pericholecystic fluid or sonographic Murphy sign. Common bile duct: Diameter: 6 mm.  No intrahepatic or extrahepatic biliary dilatation. Liver: No focal lesion identified. Within normal limits in parenchymal echogenicity. Portal vein is patent on color Doppler imaging with normal  direction of blood flow towards the liver. Other: A small RIGHT pleural effusion is identified. IMPRESSION: 1. Cholelithiasis without other sonographic evidence of acute cholecystitis. No biliary dilatation. 2. Small RIGHT pleural effusion. Electronically Signed   By: Harmon Pier M.D.   On: 04/30/2019 08:22     Subjective: Eager to go home  Discharge Exam: Vitals:   05/03/19 0426 05/03/19 0711  BP: 127/79   Pulse: 77   Resp: 18   Temp: 98.9 F (37.2 C)   SpO2: 96% 95%   Vitals:   05/02/19 2051 05/02/19 2052 05/03/19 0426 05/03/19 0711  BP: 114/68  127/79   Pulse: 73 72 77   Resp: 16 16 18    Temp: 98.2 F (36.8 C)  98.9 F (37.2 C)   TempSrc: Oral  Oral   SpO2: 97% 97% 96% 95%  Weight:      Height:        General: Pt is alert, awake, not in acute distress Cardiovascular: RRR, S1/S2 +, no rubs, no gallops Respiratory: CTA bilaterally, no wheezing, no rhonchi Abdominal: Soft, NT, ND, bowel sounds + Extremities: no edema, no cyanosis   The results of significant diagnostics from this  hospitalization (including imaging, microbiology, ancillary and laboratory) are listed below for reference.     Microbiology: Recent Results (from the past 240 hour(s))  Respiratory Panel by RT PCR (Flu A&B, Covid) - Nasopharyngeal Swab     Status: None   Collection Time: 04/24/19  8:09 AM   Specimen: Nasopharyngeal Swab  Result Value Ref Range Status   SARS Coronavirus 2 by RT PCR NEGATIVE NEGATIVE Final    Comment: (NOTE) SARS-CoV-2 target nucleic acids are NOT DETECTED. The SARS-CoV-2 RNA is generally detectable in upper respiratoy specimens during the acute phase of infection. The lowest concentration of SARS-CoV-2 viral copies this assay can detect is 131 copies/mL. A negative result does not preclude SARS-Cov-2 infection and should not be used as the sole basis for treatment or other patient management decisions. A negative result may occur with  improper specimen  collection/handling, submission of specimen other than nasopharyngeal swab, presence of viral mutation(s) within the areas targeted by this assay, and inadequate number of viral copies (<131 copies/mL). A negative result must be combined with clinical observations, patient history, and epidemiological information. The expected result is Negative. Fact Sheet for Patients:  https://www.moore.com/ Fact Sheet for Healthcare Providers:  https://www.young.biz/ This test is not yet ap proved or cleared by the [place] FDA and  has been authorized for detection and/or diagnosis of SARS-CoV-2 by FDA under an Emergency Use Authorization (EUA). This EUA will remain  in effect (meaning this test can be used) for the duration of the COVID-19 declaration under Section 564(b)(1) of the Act, 21 U.S.C. section 360bbb-3(b)(1), unless the authorization is terminated or revoked sooner.    Influenza A by PCR NEGATIVE NEGATIVE Final   Influenza B by PCR NEGATIVE NEGATIVE Final    Comment: (NOTE) The Xpert Xpress SARS-CoV-2/FLU/RSV assay is intended as an aid in  the diagnosis of influenza from Nasopharyngeal swab specimens and  should not be used as a sole basis for treatment. Nasal washings and  aspirates are unacceptable for Xpert Xpress SARS-CoV-2/FLU/RSV  testing. Fact Sheet for Patients: https://www.moore.com/ Fact Sheet for Healthcare Providers: https://www.young.biz/ This test is not yet approved or cleared by the [place] FDA and  has been authorized for detection and/or diagnosis of SARS-CoV-2 by  FDA under an Emergency Use Authorization (EUA). This EUA will remain  in effect (meaning this test can be used) for the duration of the  Covid-19 declaration under Section 564(b)(1) of the Act, 21  U.S.C. section 360bbb-3(b)(1), unless the authorization is  terminated or revoked. Performed at [facility], [address], [place], [place] [address]   MRSA PCR Screening     Status: None   Collection Time: 04/24/19 10:27 AM   Specimen: Nasal Mucosa; Nasopharyngeal  Result Value Ref Range Status   MRSA by PCR NEGATIVE NEGATIVE Final    Comment:        The GeneXpert MRSA Assay (FDA approved for NASAL specimens only), is one component of a comprehensive MRSA colonization surveillance program. It is not intended to diagnose MRSA infection nor to guide or monitor treatment for MRSA infections. Performed at [facility], [address] [place], [place] [address]   SARS CORONAVIRUS 2 (TAT 6-24 HRS) Nasopharyngeal Nasopharyngeal Swab     Status: None   Collection Time: 04/30/19  1:39 PM   Specimen: Nasopharyngeal Swab  Result Value Ref Range Status   SARS Coronavirus 2 NEGATIVE NEGATIVE Final    Comment: (NOTE) SARS-CoV-2 target nucleic acids are NOT DETECTED.  The SARS-CoV-2 RNA is generally detectable in upper and lower respiratory specimens during the acute phase of infection. Negative results do not preclude SARS-CoV-2 infection, do not rule out co-infections with other pathogens, and should not be used as the sole basis for treatment or other patient management decisions. Negative results must be combined with clinical observations, patient history, and epidemiological information. The expected result is Negative. Fact Sheet for Patients: HairSlick.no Fact Sheet for Healthcare Providers: quierodirigir.com This test is not yet approved or cleared by the [place] FDA and  has been authorized for detection and/or diagnosis of SARS-CoV-2 by FDA under an Emergency Use Authorization (EUA). This EUA will remain  in effect (meaning this test can be used) for the duration of the COVID-19 declaration under Section 56 4(b)(1) of the Act, 21 U.S.C. section 360bbb-3(b)(1), unless the authorization is terminated or revoked  sooner. Performed at [facility], [address], [place], [place] [address]      Labs: BNP (last 3 results) No results for input(s): BNP in the last 8760 hours. Basic Metabolic Panel: Recent Labs  Lab 04/30/19 0733 05/01/19 0325 05/02/19 0348 05/03/19 0352  NA 137 135 136 133*  K 4.0 3.9 4.2 3.6  CL 101 101 103 103  CO2 GLUCOSE 141* 99 101* 112*  BUN CREATININE 1.23 0.97 1.04 0.86  CALCIUM 8.9 8.5* 8.7* 8.8*   Liver Function Tests: Recent Labs  Lab 04/30/19 0733 05/01/19 0325 05/02/19 0348 05/03/19 0352  AST 232* 127* 90* 67*  ALT 364* 276* 203* 167*  ALKPHOS 200* 165* 192* 261*  BILITOT 1.9* 3.3* 3.9* 2.6*  PROT 6.9 5.7* 6.1* 6.5  ALBUMIN 3.3* 2.7* 2.8* 2.8*   Recent Labs  Lab 04/30/19 0733  LIPASE 26   No results for input(s): AMMONIA in the last 168 hours. CBC: Recent Labs  Lab 04/30/19 0733 05/01/19 0325 05/02/19 0348 05/03/19 0352  WBC 11.1* 11.4* 7.4 6.8  NEUTROABS 10.2*  --   --   --   HGB 14.4 11.9* 13.0 13.5  HCT 43.0 35.4* 37.7* 39.4  MCV 98.6 97.5 96.7 96.6  PLT 181 127* 139* 169   Cardiac Enzymes: No results for input(s): CKTOTAL, CKMB, CKMBINDEX, TROPONINI in the last 168 hours. BNP: Invalid input(s): POCBNP CBG: No results for input(s): GLUCAP in the last 168 hours. D-Dimer No results for input(s): DDIMER in the last 72 hours. Hgb A1c No results for input(s): HGBA1C in the last 72 hours. Lipid Profile No results for input(s): CHOL, HDL, LDLCALC, TRIG, CHOLHDL, LDLDIRECT in the last 72 hours. Thyroid function studies No results for input(s): TSH, T4TOTAL, T3FREE, THYROIDAB in the last 72 hours.  Invalid input(s): FREET3 Anemia work up No results for input(s): VITAMINB12, FOLATE, FERRITIN, TIBC, IRON, RETICCTPCT in the last 72 hours. Urinalysis    Component Value Date/Time   COLORURINE YELLOW 04/30/2019 0955   APPEARANCEUR CLEAR 04/30/2019 0955   LABSPEC 1.014 04/30/2019 0955   PHURINE 6.0  04/30/2019 0955   GLUCOSEU NEGATIVE 04/30/2019 0955   HGBUR NEGATIVE 04/30/2019 0955   BILIRUBINUR NEGATIVE 04/30/2019 0955   KETONESUR NEGATIVE 04/30/2019 0955   PROTEINUR NEGATIVE 04/30/2019 0955   UROBILINOGEN 1.0 01/05/2012 1400   NITRITE NEGATIVE 04/30/2019 0955   LEUKOCYTESUR NEGATIVE 04/30/2019 0955   Sepsis Labs Invalid input(s): PROCALCITONIN,  WBC,  LACTICIDVEN Microbiology Recent Results (from the past 240 hour(s))  Respiratory Panel by RT PCR (Flu A&B, Covid) - Nasopharyngeal Swab  Status: None   Collection Time: 04/24/19  8:09 AM   Specimen: Nasopharyngeal Swab  Result Value Ref Range Status   SARS Coronavirus 2 by RT PCR NEGATIVE NEGATIVE Final    Comment: (NOTE) SARS-CoV-2 target nucleic acids are NOT DETECTED. The SARS-CoV-2 RNA is generally detectable in upper respiratoy specimens during the acute phase of infection. The lowest concentration of SARS-CoV-2 viral copies this assay can detect is 131 copies/mL. A negative result does not preclude SARS-Cov-2 infection and should not be used as the sole basis for treatment or other patient management decisions. A negative result may occur with  improper specimen collection/handling, submission of specimen other than nasopharyngeal swab, presence of viral mutation(s) within the areas targeted by this assay, and inadequate number of viral copies (<131 copies/mL). A negative result must be combined with clinical observations, patient history, and epidemiological information. The expected result is Negative. Fact Sheet for Patients:  PinkCheek.be Fact Sheet for Healthcare Providers:  GravelBags.it This test is not yet ap proved or cleared by the [place] FDA and  has been authorized for detection and/or diagnosis of SARS-CoV-2 by FDA under an Emergency Use Authorization (EUA). This EUA will remain  in effect (meaning this test can be used) for the duration  of the COVID-19 declaration under Section 564(b)(1) of the Act, 21 U.S.C. section 360bbb-3(b)(1), unless the authorization is terminated or revoked sooner.    Influenza A by PCR NEGATIVE NEGATIVE Final   Influenza B by PCR NEGATIVE NEGATIVE Final    Comment: (NOTE) The Xpert Xpress SARS-CoV-2/FLU/RSV assay is intended as an aid in  the diagnosis of influenza from Nasopharyngeal swab specimens and  should not be used as a sole basis for treatment. Nasal washings and  aspirates are unacceptable for Xpert Xpress SARS-CoV-2/FLU/RSV  testing. Fact Sheet for Patients: PinkCheek.be Fact Sheet for Healthcare Providers: GravelBags.it This test is not yet approved or cleared by the [place] FDA and  has been authorized for detection and/or diagnosis of SARS-CoV-2 by  FDA under an Emergency Use Authorization (EUA). This EUA will remain  in effect (meaning this test can be used) for the duration of the  Covid-19 declaration under Section 564(b)(1) of the Act, 21  U.S.C. section 360bbb-3(b)(1), unless the authorization is  terminated or revoked. Performed at [facility], [place] [address] [place], [place] [address]   MRSA PCR Screening     Status: None   Collection Time: 04/24/19 10:27 AM   Specimen: Nasal Mucosa; Nasopharyngeal  Result Value Ref Range Status   MRSA by PCR NEGATIVE NEGATIVE Final    Comment:        The GeneXpert MRSA Assay (FDA approved for NASAL specimens only), is one component of a comprehensive MRSA colonization surveillance program. It is not intended to diagnose MRSA infection nor to guide or monitor treatment for MRSA infections. Performed at [facility], [place] [address], [place], [place] [address]   SARS CORONAVIRUS 2 (TAT 6-24 HRS) Nasopharyngeal Nasopharyngeal Swab     Status: None   Collection Time: 04/30/19  1:39 PM   Specimen: Nasopharyngeal Swab  Result Value Ref Range Status    SARS Coronavirus 2 NEGATIVE NEGATIVE Final    Comment: (NOTE) SARS-CoV-2 target nucleic acids are NOT DETECTED. The SARS-CoV-2 RNA is generally detectable in upper and lower respiratory specimens during the acute phase of infection. Negative results do not preclude SARS-CoV-2 infection, do not rule out co-infections with other pathogens, and should not be used as the  sole basis for treatment or other patient management decisions. Negative results must be combined with clinical observations, patient history, and epidemiological information. The expected result is Negative. Fact Sheet for Patients: HairSlick.no Fact Sheet for Healthcare Providers: quierodirigir.com This test is not yet approved or cleared by the [place] FDA and  has been authorized for detection and/or diagnosis of SARS-CoV-2 by FDA under an Emergency Use Authorization (EUA). This EUA will remain  in effect (meaning this test can be used) for the duration of the COVID-19 declaration under Section 56 4(b)(1) of the Act, 21 U.S.C. section 360bbb-3(b)(1), unless the authorization is terminated or revoked sooner. Performed at [facility], [address], [place], [place] [address]    Time spent: 30 min  SIGNED:   Rickey Barbara, MD  [facility] 05/03/2019, 1:40 PM  If 7PM-7AM, please contact night-coverage

## 2019-05-03 NOTE — Progress Notes (Signed)
Patient remains pain-free.  Tolerating solid diet without problem.  LFTs trending downward.  Abdomen nontender.  Hep C viral quantitation negative, status post treatment.  Recommendations:  1. Have contacted my office to contact pt to make outpt f/u appt w/ me, to monitor LFT's, verify he's in the pipeline for f/u w/ Gen'l Surg (for mgt of gallstones), and decide on what method for colon cancer screening.  2. We will sign off but call us if further questions arise.  Florencia Reasons, M.D. Pager [phone] If no answer or after 5 PM call [phone]

## 2019-05-03 NOTE — Plan of Care (Signed)
  Problem: Education: Goal: Knowledge of General Education information will improve Description: Including pain rating scale, medication(s)/side effects and non-pharmacologic comfort measures Outcome: Progressing   Problem: Health Behavior/Discharge Planning: Goal: Ability to manage health-related needs will improve Outcome: Progressing   Problem: Clinical Measurements: Goal: Ability to maintain clinical measurements within normal limits will improve Outcome: Progressing Goal: Diagnostic test results will improve Outcome: Progressing Goal: Respiratory complications will improve Outcome: Progressing Goal: Cardiovascular complication will be avoided Outcome: Progressing   Problem: Activity: Goal: Risk for activity intolerance will decrease Outcome: Progressing   Problem: Nutrition: Goal: Adequate nutrition will be maintained Outcome: Progressing   Problem: Elimination: Goal: Will not experience complications related to bowel motility Outcome: Progressing Goal: Will not experience complications related to urinary retention Outcome: Progressing   Problem: Pain Managment: Goal: General experience of comfort will improve Outcome: Progressing   Problem: Safety: Goal: Ability to remain free from injury will improve Outcome: Progressing   Problem: Skin Integrity: Goal: Risk for impaired skin integrity will decrease Outcome: Progressing   

## 2019-05-05 ENCOUNTER — Telehealth: Payer: Self-pay | Admitting: *Deleted

## 2019-05-05 NOTE — Telephone Encounter (Signed)
PHONE [id number] HOSP-04/24/2019 - 04/26/2019 (2 days)  [facility]  HOSP-04/30/2019 - 05/03/2019 (3 days)  [facility] TOC w/[facility]  TOC f/u appt 05/20/2019@1 :45 PM Marjie Skiff, PA-C

## 2019-05-05 NOTE — Telephone Encounter (Signed)
Transition Care Management Follow-up Telephone Call   Date discharged? 05/03/19   How have you been since you were released from the hospital? Doing well   Do you understand why you were in the hospital? yes   Do you understand the discharge instructions? yes   Where were you discharged to? home   Items Reviewed:  Medications reviewed: yes, they added a blood thinner and cholesterol med, but I don't have the sheet right now.  Allergies reviewed: yes  Dietary changes reviewed: yes  Referrals reviewed: yes   Functional Questionnaire:   Activities of Daily Living (ADLs):   He states they are independent in the following: ambulation, bathing and hygiene, feeding, continence, grooming, toileting and dressing States they require assistance with the following: na   Any transportation issues/concerns?: no   Any patient concerns? no   Confirmed importance and date/time of follow-up visits scheduled yes  Provider Appointment booked with PCP 05/12/19. (pt states he already has appt for follow up w/ PCP and does not wish to be seen any sooner)  Confirmed with patient if condition begins to worsen call PCP or go to the ER.  Patient was given the office number and encouraged to call back with question or concerns.  : yes

## 2019-05-06 NOTE — Telephone Encounter (Signed)
Patient contacted regarding discharge from [facility] on 05/03/19.  Patient understands to follow up with provider Marjie Skiff, PA on 05/20/2019 at 1:45 PM at [facility]. Patient understands discharge instructions? Yes Patient understands medications and regiment? Yes Patient understands to bring all medications to this visit? Yes

## 2019-05-09 ENCOUNTER — Encounter (HOSPITAL_COMMUNITY): Payer: Self-pay | Admitting: Emergency Medicine

## 2019-05-09 ENCOUNTER — Telehealth (HOSPITAL_COMMUNITY): Payer: Self-pay

## 2019-05-09 ENCOUNTER — Inpatient Hospital Stay (HOSPITAL_COMMUNITY)
Admission: EM | Admit: 2019-05-09 | Discharge: 2019-05-13 | DRG: 871 | Disposition: A | Discharge status: Home / Self Care | Payer: Medicare Other | Attending: Internal Medicine | Admitting: Internal Medicine

## 2019-05-09 ENCOUNTER — Inpatient Hospital Stay (HOSPITAL_COMMUNITY): Payer: Medicare Other

## 2019-05-09 ENCOUNTER — Other Ambulatory Visit: Payer: Self-pay

## 2019-05-09 ENCOUNTER — Emergency Department (HOSPITAL_COMMUNITY): Payer: Medicare Other

## 2019-05-09 DIAGNOSIS — N179 Acute kidney failure, unspecified: Secondary | ICD-10-CM | POA: Diagnosis not present

## 2019-05-09 DIAGNOSIS — J449 Chronic obstructive pulmonary disease, unspecified: Secondary | ICD-10-CM | POA: Diagnosis present

## 2019-05-09 DIAGNOSIS — F1410 Cocaine abuse, uncomplicated: Secondary | ICD-10-CM | POA: Diagnosis present

## 2019-05-09 DIAGNOSIS — Z7902 Long term (current) use of antithrombotics/antiplatelets: Secondary | ICD-10-CM

## 2019-05-09 DIAGNOSIS — I252 Old myocardial infarction: Secondary | ICD-10-CM | POA: Diagnosis not present

## 2019-05-09 DIAGNOSIS — K8012 Calculus of gallbladder with acute and chronic cholecystitis without obstruction: Secondary | ICD-10-CM

## 2019-05-09 DIAGNOSIS — J40 Bronchitis, not specified as acute or chronic: Secondary | ICD-10-CM | POA: Diagnosis not present

## 2019-05-09 DIAGNOSIS — A419 Sepsis, unspecified organism: Secondary | ICD-10-CM | POA: Diagnosis not present

## 2019-05-09 DIAGNOSIS — I10 Essential (primary) hypertension: Secondary | ICD-10-CM | POA: Diagnosis not present

## 2019-05-09 DIAGNOSIS — E785 Hyperlipidemia, unspecified: Secondary | ICD-10-CM

## 2019-05-09 DIAGNOSIS — Z8673 Personal history of transient ischemic attack (TIA), and cerebral infarction without residual deficits: Secondary | ICD-10-CM | POA: Diagnosis not present

## 2019-05-09 DIAGNOSIS — I498 Other specified cardiac arrhythmias: Secondary | ICD-10-CM | POA: Diagnosis not present

## 2019-05-09 DIAGNOSIS — R1011 Right upper quadrant pain: Secondary | ICD-10-CM | POA: Diagnosis not present

## 2019-05-09 DIAGNOSIS — K219 Gastro-esophageal reflux disease without esophagitis: Secondary | ICD-10-CM | POA: Diagnosis present

## 2019-05-09 DIAGNOSIS — Z5181 Encounter for therapeutic drug level monitoring: Secondary | ICD-10-CM

## 2019-05-09 DIAGNOSIS — I2510 Atherosclerotic heart disease of native coronary artery without angina pectoris: Secondary | ICD-10-CM | POA: Diagnosis not present

## 2019-05-09 DIAGNOSIS — F17210 Nicotine dependence, cigarettes, uncomplicated: Secondary | ICD-10-CM | POA: Diagnosis present

## 2019-05-09 DIAGNOSIS — Z7982 Long term (current) use of aspirin: Secondary | ICD-10-CM | POA: Diagnosis not present

## 2019-05-09 DIAGNOSIS — K8050 Calculus of bile duct without cholangitis or cholecystitis without obstruction: Secondary | ICD-10-CM | POA: Diagnosis not present

## 2019-05-09 DIAGNOSIS — Z79899 Other long term (current) drug therapy: Secondary | ICD-10-CM | POA: Diagnosis not present

## 2019-05-09 DIAGNOSIS — Z743 Need for continuous supervision: Secondary | ICD-10-CM | POA: Diagnosis not present

## 2019-05-09 DIAGNOSIS — K810 Acute cholecystitis: Secondary | ICD-10-CM | POA: Diagnosis not present

## 2019-05-09 DIAGNOSIS — I25110 Atherosclerotic heart disease of native coronary artery with unstable angina pectoris: Secondary | ICD-10-CM | POA: Diagnosis not present

## 2019-05-09 DIAGNOSIS — Z20822 Contact with and (suspected) exposure to covid-19: Secondary | ICD-10-CM | POA: Diagnosis not present

## 2019-05-09 DIAGNOSIS — I959 Hypotension, unspecified: Secondary | ICD-10-CM | POA: Diagnosis present

## 2019-05-09 DIAGNOSIS — K760 Fatty (change of) liver, not elsewhere classified: Secondary | ICD-10-CM | POA: Diagnosis not present

## 2019-05-09 DIAGNOSIS — Z955 Presence of coronary angioplasty implant and graft: Secondary | ICD-10-CM | POA: Diagnosis not present

## 2019-05-09 DIAGNOSIS — K8066 Calculus of gallbladder and bile duct with acute and chronic cholecystitis without obstruction: Secondary | ICD-10-CM | POA: Diagnosis present

## 2019-05-09 DIAGNOSIS — E872 Acidosis: Secondary | ICD-10-CM | POA: Diagnosis not present

## 2019-05-09 DIAGNOSIS — Z03818 Encounter for observation for suspected exposure to other biological agents ruled out: Secondary | ICD-10-CM | POA: Diagnosis not present

## 2019-05-09 DIAGNOSIS — I2119 ST elevation (STEMI) myocardial infarction involving other coronary artery of inferior wall: Secondary | ICD-10-CM | POA: Diagnosis not present

## 2019-05-09 DIAGNOSIS — R509 Fever, unspecified: Secondary | ICD-10-CM

## 2019-05-09 DIAGNOSIS — K828 Other specified diseases of gallbladder: Secondary | ICD-10-CM | POA: Diagnosis not present

## 2019-05-09 DIAGNOSIS — K9189 Other postprocedural complications and disorders of digestive system: Secondary | ICD-10-CM | POA: Diagnosis not present

## 2019-05-09 DIAGNOSIS — R1084 Generalized abdominal pain: Secondary | ICD-10-CM | POA: Diagnosis not present

## 2019-05-09 DIAGNOSIS — K8020 Calculus of gallbladder without cholecystitis without obstruction: Secondary | ICD-10-CM | POA: Diagnosis present

## 2019-05-09 LAB — CBC WITH DIFFERENTIAL/PLATELET
Abs Immature Granulocytes: 0.04 10*3/uL (ref 0.00–0.07)
Basophils Absolute: 0 10*3/uL (ref 0.0–0.1)
Basophils Relative: 0 %
Eosinophils Absolute: 0 10*3/uL (ref 0.0–0.5)
Eosinophils Relative: 0 %
HCT: 44.3 % (ref 39.0–52.0)
Hemoglobin: 14.5 g/dL (ref 13.0–17.0)
Immature Granulocytes: 0 %
Lymphocytes Relative: 8 %
Lymphs Abs: 0.9 10*3/uL (ref 0.7–4.0)
MCH: 32.7 pg (ref 26.0–34.0)
MCHC: 32.7 g/dL (ref 30.0–36.0)
MCV: 99.8 fL (ref 80.0–100.0)
Monocytes Absolute: 0.9 10*3/uL (ref 0.1–1.0)
Monocytes Relative: 9 %
Neutro Abs: 8.8 10*3/uL — ABNORMAL HIGH (ref 1.7–7.7)
Neutrophils Relative %: 83 %
Platelets: 269 10*3/uL (ref 150–400)
RBC: 4.44 MIL/uL (ref 4.22–5.81)
RDW: 14 % (ref 11.5–15.5)
WBC: 10.6 10*3/uL — ABNORMAL HIGH (ref 4.0–10.5)
nRBC: 0 % (ref 0.0–0.2)

## 2019-05-09 LAB — RAPID URINE DRUG SCREEN, HOSP PERFORMED
Amphetamines: NOT DETECTED
Barbiturates: NOT DETECTED
Benzodiazepines: NOT DETECTED
Cocaine: NOT DETECTED
Opiates: NOT DETECTED
Tetrahydrocannabinol: POSITIVE — AB

## 2019-05-09 LAB — URINALYSIS, ROUTINE W REFLEX MICROSCOPIC
Bilirubin Urine: NEGATIVE
Glucose, UA: 50 mg/dL — AB
Hgb urine dipstick: NEGATIVE
Ketones, ur: NEGATIVE mg/dL
Leukocytes,Ua: NEGATIVE
Nitrite: NEGATIVE
Protein, ur: 100 mg/dL — AB
Specific Gravity, Urine: 1.023 (ref 1.005–1.030)
pH: 6 (ref 5.0–8.0)

## 2019-05-09 LAB — COMPREHENSIVE METABOLIC PANEL
ALT: 48 U/L — ABNORMAL HIGH (ref 0–44)
ALT: 42 U/L (ref 0–44)
AST: 34 U/L (ref 15–41)
AST: 25 U/L (ref 15–41)
Albumin: 3.3 g/dL — ABNORMAL LOW (ref 3.5–5.0)
Albumin: 3.2 g/dL — ABNORMAL LOW (ref 3.5–5.0)
Alkaline Phosphatase: 158 U/L — ABNORMAL HIGH (ref 38–126)
Alkaline Phosphatase: 156 U/L — ABNORMAL HIGH (ref 38–126)
Anion gap: 14 (ref 5–15)
Anion gap: 11 (ref 5–15)
BUN: 12 mg/dL (ref 8–23)
BUN: 10 mg/dL (ref 8–23)
CO2: 21 mmol/L — ABNORMAL LOW (ref 22–32)
CO2: 22 mmol/L (ref 22–32)
Calcium: 9.3 mg/dL (ref 8.9–10.3)
Calcium: 9.2 mg/dL (ref 8.9–10.3)
Chloride: 99 mmol/L (ref 98–111)
Chloride: 102 mmol/L (ref 98–111)
Creatinine, Ser: 0.86 mg/dL (ref 0.61–1.24)
Creatinine, Ser: 0.88 mg/dL (ref 0.61–1.24)
GFR calc Af Amer: >60 mL/min (ref >60)
GFR calc Af Amer: >60 mL/min (ref >60)
GFR calc non Af Amer: >60 mL/min (ref >60)
GFR calc non Af Amer: >60 mL/min (ref >60)
Glucose, Bld: 192 mg/dL — ABNORMAL HIGH (ref 70–99)
Glucose, Bld: 123 mg/dL — ABNORMAL HIGH (ref 70–99)
Potassium: 4.1 mmol/L (ref 3.5–5.1)
Potassium: 4.4 mmol/L (ref 3.5–5.1)
Sodium: 134 mmol/L — ABNORMAL LOW (ref 135–145)
Sodium: 135 mmol/L (ref 135–145)
Total Bilirubin: 1.7 mg/dL — ABNORMAL HIGH (ref 0.3–1.2)
Total Bilirubin: 1.7 mg/dL — ABNORMAL HIGH (ref 0.3–1.2)
Total Protein: 7.6 g/dL (ref 6.5–8.1)
Total Protein: 7.7 g/dL (ref 6.5–8.1)

## 2019-05-09 LAB — RESPIRATORY PANEL BY RT PCR (FLU A&B, COVID)
Influenza A by PCR: NEGATIVE
Influenza B by PCR: NEGATIVE
SARS Coronavirus 2 by RT PCR: NEGATIVE

## 2019-05-09 LAB — CBC
HCT: 44.1 % (ref 39.0–52.0)
Hemoglobin: 14.5 g/dL (ref 13.0–17.0)
MCH: 32.6 pg (ref 26.0–34.0)
MCHC: 32.9 g/dL (ref 30.0–36.0)
MCV: 99.1 fL (ref 80.0–100.0)
Platelets: 248 10*3/uL (ref 150–400)
RBC: 4.45 MIL/uL (ref 4.22–5.81)
RDW: 14 % (ref 11.5–15.5)
WBC: 11.5 10*3/uL — ABNORMAL HIGH (ref 4.0–10.5)
nRBC: 0 % (ref 0.0–0.2)

## 2019-05-09 LAB — TROPONIN I (HIGH SENSITIVITY)
Troponin I (High Sensitivity): 45 ng/L — ABNORMAL HIGH (ref <18)
Troponin I (High Sensitivity): 47 ng/L — ABNORMAL HIGH (ref <18)

## 2019-05-09 LAB — LIPASE, BLOOD: Lipase: 18 U/L (ref 11–51)

## 2019-05-09 MED ORDER — COLCHICINE 0.6 MG PO TABS
0.6000 mg | ORAL_TABLET | Freq: Every day | ORAL | Status: DC
  Administered 2019-05-09 – 2019-05-12 (×4): 0.6 mg via ORAL
  Filled 2019-05-09 (×5): qty 1

## 2019-05-09 MED ORDER — TIROFIBAN HCL IN NACL 5-0.9 MG/100ML-% IV SOLN
0.1500 ug/kg/min | INTRAVENOUS | Status: DC
  Administered 2019-05-09 – 2019-05-10 (×3): 0.15 ug/kg/min via INTRAVENOUS
  Filled 2019-05-09 (×4): qty 100

## 2019-05-09 MED ORDER — NITROGLYCERIN 0.4 MG SL SUBL
0.4000 mg | SUBLINGUAL_TABLET | SUBLINGUAL | Status: DC | PRN
  Administered 2019-05-10: 0.4 mg via SUBLINGUAL
  Filled 2019-05-09: qty 1

## 2019-05-09 MED ORDER — ONDANSETRON HCL 4 MG/2ML IJ SOLN
4.0000 mg | Freq: Four times a day (QID) | INTRAMUSCULAR | Status: DC | PRN
  Administered 2019-05-11: 4 mg via INTRAVENOUS
  Filled 2019-05-09: qty 2

## 2019-05-09 MED ORDER — ONDANSETRON HCL 4 MG/2ML IJ SOLN
4.0000 mg | Freq: Once | INTRAMUSCULAR | Status: AC
  Administered 2019-05-09: 4 mg via INTRAVENOUS
  Filled 2019-05-09: qty 2

## 2019-05-09 MED ORDER — MORPHINE SULFATE (PF) 2 MG/ML IV SOLN
2.0000 mg | INTRAVENOUS | Status: DC | PRN
  Administered 2019-05-10: 2 mg via INTRAVENOUS
  Filled 2019-05-09: qty 1

## 2019-05-09 MED ORDER — ALBUTEROL SULFATE (2.5 MG/3ML) 0.083% IN NEBU: 3.0000 mL | INHALATION_SOLUTION | Freq: Four times a day (QID) | RESPIRATORY_TRACT | Status: DC | PRN

## 2019-05-09 MED ORDER — TICAGRELOR 90 MG PO TABS: 90.0000 mg | ORAL_TABLET | Freq: Two times a day (BID) | ORAL | Status: DC

## 2019-05-09 MED ORDER — ORAL CARE MOUTH RINSE
15.0000 mL | Freq: Two times a day (BID) | OROMUCOSAL | Status: DC
  Administered 2019-05-09 – 2019-05-11 (×5): 15 mL via OROMUCOSAL

## 2019-05-09 MED ORDER — PIPERACILLIN-TAZOBACTAM 3.375 G IVPB
3.3750 g | Freq: Three times a day (TID) | INTRAVENOUS | Status: DC
  Administered 2019-05-09 – 2019-05-13 (×14): 3.375 g via INTRAVENOUS
  Filled 2019-05-09 (×14): qty 50

## 2019-05-09 MED ORDER — ADULT MULTIVITAMIN W/MINERALS CH
1.0000 | ORAL_TABLET | Freq: Every day | ORAL | Status: DC
  Administered 2019-05-09 – 2019-05-12 (×4): 1 via ORAL
  Filled 2019-05-09 (×5): qty 1

## 2019-05-09 MED ORDER — METOPROLOL SUCCINATE ER 25 MG PO TB24
25.0000 mg | ORAL_TABLET | Freq: Every day | ORAL | Status: DC
  Administered 2019-05-09: 25 mg via ORAL
  Filled 2019-05-09: qty 1

## 2019-05-09 MED ORDER — SODIUM CHLORIDE 0.9 % IV SOLN: INTRAVENOUS | Status: AC

## 2019-05-09 MED ORDER — FENTANYL CITRATE (PF) 100 MCG/2ML IJ SOLN
INTRAMUSCULAR | Status: AC
  Filled 2019-05-09: qty 2

## 2019-05-09 MED ORDER — TICAGRELOR 90 MG PO TABS
90.0000 mg | ORAL_TABLET | Freq: Two times a day (BID) | ORAL | Status: DC
  Administered 2019-05-09: 90 mg via ORAL
  Filled 2019-05-09 (×2): qty 1

## 2019-05-09 MED ORDER — OXYCODONE HCL 5 MG PO TABS
5.0000 mg | ORAL_TABLET | ORAL | Status: DC | PRN
  Administered 2019-05-09: 5 mg via ORAL
  Filled 2019-05-09: qty 1

## 2019-05-09 MED ORDER — ACETAMINOPHEN 325 MG PO TABS
650.0000 mg | ORAL_TABLET | Freq: Four times a day (QID) | ORAL | Status: DC | PRN
  Administered 2019-05-09: 650 mg via ORAL
  Filled 2019-05-09: qty 2

## 2019-05-09 MED ORDER — MORPHINE SULFATE (PF) 4 MG/ML IV SOLN
4.0000 mg | INTRAVENOUS | Status: DC | PRN
  Administered 2019-05-09: 06:00:00 4 mg via INTRAVENOUS
  Filled 2019-05-09: qty 1

## 2019-05-09 MED ORDER — CYCLOBENZAPRINE HCL 10 MG PO TABS
10.0000 mg | ORAL_TABLET | Freq: Every day | ORAL | Status: DC | PRN
  Administered 2019-05-10: 10 mg via ORAL
  Filled 2019-05-09: qty 1

## 2019-05-09 MED ORDER — ASPIRIN EC 81 MG PO TBEC
81.0000 mg | DELAYED_RELEASE_TABLET | Freq: Every day | ORAL | Status: DC
  Administered 2019-05-09 – 2019-05-12 (×4): 81 mg via ORAL
  Filled 2019-05-09 (×5): qty 1

## 2019-05-09 MED ORDER — PIPERACILLIN-TAZOBACTAM 3.375 G IVPB 30 MIN
3.3750 g | Freq: Once | INTRAVENOUS | Status: AC
  Administered 2019-05-09: 3.375 g via INTRAVENOUS
  Filled 2019-05-09: qty 50

## 2019-05-09 MED ORDER — ATORVASTATIN CALCIUM 80 MG PO TABS
80.0000 mg | ORAL_TABLET | Freq: Every day | ORAL | Status: DC
  Administered 2019-05-09: 80 mg via ORAL
  Filled 2019-05-09 (×2): qty 1

## 2019-05-09 MED ORDER — FENTANYL CITRATE (PF) 100 MCG/2ML IJ SOLN
50.0000 ug | Freq: Once | INTRAMUSCULAR | Status: AC
  Administered 2019-05-09: 50 ug via INTRAVENOUS
  Filled 2019-05-09: qty 2

## 2019-05-09 MED ORDER — SODIUM CHLORIDE 0.9 % IV SOLN: INTRAVENOUS | Status: DC

## 2019-05-09 MED ORDER — ONDANSETRON HCL 4 MG PO TABS: 4.0000 mg | ORAL_TABLET | Freq: Four times a day (QID) | ORAL | Status: DC | PRN

## 2019-05-09 MED ORDER — FENTANYL CITRATE (PF) 100 MCG/2ML IJ SOLN
50.0000 ug | INTRAMUSCULAR | Status: DC | PRN
  Administered 2019-05-09 – 2019-05-11 (×4): 50 ug via INTRAVENOUS
  Filled 2019-05-09 (×3): qty 2

## 2019-05-09 MED ORDER — BUDESONIDE 0.5 MG/2ML IN SUSP
0.5000 mg | Freq: Two times a day (BID) | RESPIRATORY_TRACT | Status: DC
  Administered 2019-05-09 – 2019-05-13 (×8): 0.5 mg via RESPIRATORY_TRACT
  Filled 2019-05-09 (×9): qty 2

## 2019-05-09 NOTE — Progress Notes (Deleted)
Subjective:   Hunter Mcmillan is a 69 y.o. male who presents for Medicare Annual/Subsequent preventive examination.  Review of Systems:  Home Safety/Smoke Alarms: Feels safe in home. Smoke alarms in place.    Male:   CCS-     PSA-  Lab Results  Component Value Date   PSA 0.31 05/25/2017   PSA 0.78 10/08/2015   PSA 1.18 10/05/2014       Objective:    Vitals: There were no vitals taken for this visit.  There is no height or weight on file to calculate BMI.  Advanced Directives 05/09/2019 05/09/2019 05/02/2019 04/30/2019 12/13/2018 10/12/2018 01/28/2017  Does Patient Have a Medical Advance Directive? _0  No No  Would patient like information on creating a medical advance directive? No - Patient declined No - Patient declined No - Patient declined No - Patient declined Yes (ED - Information included in AVS) Yes (ED - Information included in AVS) No - Patient declined  Pre-existing out of facility DNR order (yellow form or pink MOST form) - - - - - - -    Tobacco Social History   Tobacco Use  Smoking Status Light Tobacco Smoker  . Packs/day: 0.10  . Types: Cigarettes  Smokeless Tobacco Never Used  Tobacco Comment   almost quit, very rare, 1-2 daily     Ready to quit: Not Answered Counseling given: Not Answered Comment: almost quit, very rare, 1-2 daily   Clinical Intake:                       Past Medical History:  Diagnosis Date  . Abdominal pain 05/02/2019  . Alcoholism ([place])   . CAD (coronary artery disease)    a. 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 Chicken pox   . Depression   . ETOH abuse   . Genital warts   . Hepatic steatosis   . Hepatitis C      a. s/p [name] ~ 2003 in [place], [place] => stopped after 6 mos due to neutropenia - viral load noted to be low at that time;  no follow up since;   b. abdominal u/s 11/13:  diffuse hepatic steatosis and/or hepatocellular disease    . Hx of echocardiogram    a. Echo 01/06/12: Mild focal basal hypertrophy the septum, vigorous LV, EF 65-70%, Gr 2 diast dysfn, trivial AI, MAC, trivial MR, trivial TR, PASP 36  . Hyperlipidemia   . Hypertension   . Non-STEMI (non-ST elevated myocardial infarction) ([place])   . Stroke [facility])    "minor" pt reported  . Thrombocytopenia ([place])   . Tobacco abuse    Past Surgical History:  Procedure Laterality Date  . CORONARY ANGIOGRAPHY N/A 04/24/2019   Procedure: CORONARY ANGIOGRAPHY;  Surgeon: Martinique, Peter M, MD;  Location: [place] [facility];  Service: Cardiovascular;  Laterality: N/A;  . CORONARY/GRAFT ACUTE MI REVASCULARIZATION N/A 04/24/2019   Procedure: Coronary/Graft Acute MI Revascularization;  Surgeon: Martinique, Peter M, MD;  Location: [place] [facility];  Service: Cardiovascular;  Laterality: N/A;  . EYE SURGERY  1954   corrective surgery for crossed eyes  .  EYE SURGERY  1959  . LEFT HEART CATH AND CORONARY ANGIOGRAPHY N/A 04/24/2019   Procedure: LEFT HEART CATH AND CORONARY ANGIOGRAPHY;  Surgeon: Martinique, Peter M, MD;  Location: [place] [facility];  Service: Cardiovascular;  Laterality: N/A;  . LEFT HEART CATHETERIZATION WITH CORONARY ANGIOGRAM N/A 01/05/2012   Procedure: LEFT HEART CATHETERIZATION WITH CORONARY ANGIOGRAM;  Surgeon: Larey Dresser, MD;  Location: [facility] C[facility] LAB;  Service: Cardiovascular;  Laterality: N/A;   Family History  Problem Relation Age of Onset  . Heart attack Mother 11  . Diabetes Mother   . Heart disease Mother   . Hyperlipidemia Father   . Hypertension Father   . Diabetes Maternal Grandfather   . Heart disease Maternal Grandfather   . Early death Maternal Grandfather   . Heart attack Maternal Grandfather   .  Hearing loss Sister   . Hyperlipidemia Sister   . Depression Brother   . Diabetes Brother   . Hyperlipidemia Brother   . Early death Maternal Grandmother   . Heart disease Maternal Grandmother   . Heart attack Maternal Grandmother   . Hearing loss Sister    Social History   Socioeconomic History  . Marital status: Single    Spouse name: Not on file  . Number of children: 1  . Years of education: Not on file  . Highest education level: Bachelor's degree (e.g., BA, AB, BS)  Occupational History  . Occupation: Music therapist: [employer]    Comment: retired  Tobacco Use  . Smoking status: Light Tobacco Smoker    Packs/day: 0.10    Types: Cigarettes  . Smokeless tobacco: Never Used  . Tobacco comment: almost quit, very rare, 1-2 daily  Substance and Sexual Activity  . Alcohol use: Not Currently    Alcohol/week: 0.0 standard drinks    Comment: Last drink: 10 days ago   . Drug use: Not Currently    Types: Cocaine    Comment: history of cocaine last use 2014  . Sexual activity: Yes    Partners: Female    Comment: pt. declined condoms  Other Topics Concern  . Not on file  Social History Narrative  . Not on file   Social Determinants of Health   Financial Resource Strain:   . Difficulty of Paying Living Expenses: Not on file  Food Insecurity:   . Worried About Charity fundraiser in the Last Year: Not on file  . Ran Out of Food in the Last Year: Not on file  Transportation Needs:   . Lack of Transportation (Medical): Not on file  . Lack of Transportation (Non-Medical): Not on file  Physical Activity:   . Days of Exercise per Week: Not on file  . Minutes of Exercise per Session: Not on file  Stress:   . Feeling of Stress : Not on file  Social Connections:   . Frequency of Communication with Friends and Family: Not on file  . Frequency of Social Gatherings with Friends and Family: Not on file  . Attends Religious Services: Not on file  . Active Member  of Clubs or Organizations: Not on file  . Attends Archivist Meetings: Not on file  . Marital Status: Not on file    Facility-Administered Encounter Medications as of 05/10/2019  Medication  . 0.9 %  sodium chloride infusion  . acetaminophen (TYLENOL) tablet 650 mg  . albuterol (PROVENTIL) (2.5 MG/3ML) 0.083% nebulizer solution 3 mL  . aspirin EC tablet 81  mg  . atorvastatin (LIPITOR) tablet 80 mg  . budesonide (PULMICORT) nebulizer solution 0.5 mg  . colchicine tablet 0.6 mg  . cyclobenzaprine (FLEXERIL) tablet 10 mg  . fentaNYL (SUBLIMAZE) injection 50 mcg  . MEDLINE mouth rinse  . metoprolol succinate (TOPROL-XL) 24 hr tablet 25 mg  . morphine 2 MG/ML injection 2 mg  . multivitamin with minerals tablet 1 tablet  . nitroGLYCERIN (NITROSTAT) SL tablet 0.4 mg  . ondansetron (ZOFRAN) tablet 4 mg   Or  . ondansetron (ZOFRAN) injection 4 mg  . oxyCODONE (Oxy IR/ROXICODONE) immediate release tablet 5 mg  . piperacillin-tazobactam (ZOSYN) IVPB 3.375 g  . tirofiban (AGGRASTAT) infusion 50 mcg/mL 100 mL   Outpatient Encounter Medications as of 05/10/2019  Medication Sig  . albuterol (VENTOLIN HFA) 108 (90 Base) MCG/ACT inhaler Inhale 2 puffs into the lungs every 6 (six) hours as needed for wheezing or shortness of breath.  Marland Kitchen aspirin EC 81 MG tablet Take 81 mg by mouth daily.  Marland Kitchen atorvastatin (LIPITOR) 80 MG tablet Take 1 tablet (80 mg total) by mouth daily at 6 PM.  . colchicine 0.6 MG tablet Take 1 tablet (0.6 mg total) by mouth daily.  . cyclobenzaprine (FLEXERIL) 10 MG tablet TAKE 1 TABLET BY MOUTH TWICE DAILY AS NEEDED FOR MUSCLE SPASM (Patient taking differently: Take 10 mg by mouth daily as needed for muscle spasms. )  . fluticasone (FLOVENT HFA) 110 MCG/ACT inhaler Inhale 2 puffs into the lungs 2 (two) times daily.  . metoprolol succinate (TOPROL-XL) 25 MG 24 hr tablet Take 1 tablet (25 mg total) by mouth daily.  . Multiple Vitamin (MULTIVITAMIN WITH MINERALS) TABS tablet  Take 1 tablet by mouth daily.  . nitroGLYCERIN (NITROSTAT) 0.4 MG SL tablet Place 1 tablet (0.4 mg total) under the tongue every 5 (five) minutes as needed for chest pain.  . ticagrelor (BRILINTA) 90 MG TABS tablet Take 1 tablet (90 mg total) by mouth 2 (two) times daily.    Activities of Daily Living In your present state of health, do you have any difficulty performing the following activities: 05/09/2019 05/02/2019  Hearing? N N  Vision? N N  Difficulty concentrating or making decisions? N N  Walking or climbing stairs? Y Y  Dressing or bathing? N N  Doing errands, shopping? N N  Some recent data might be hidden    Patient Care Team: Copland, Gay Filler, MD as PCP - General (Family Medicine) Martinique, Peter M, MD as PCP - Cardiology (Cardiology)   Assessment:   This is a routine wellness examination for [facility]. Physical assessment deferred to PCP.  Exercise Activities and Dietary recommendations   Diet (meal preparation, eat out, water intake, caffeinated beverages, dairy products, fruits and vegetables): {Desc; diets:16563} Breakfast: Lunch:  Dinner:      Goals   None     Fall Risk Fall Risk  10/01/2018 12/09/2016 10/08/2015 07/23/2015 04/09/2015  Falls in the past year? (No Data) No No No No  Comment Emmi Telephone Survey: data to providers prior to load - - - -  Number falls in past yr: (No Data) - - - -  Comment Emmi Telephone Survey Actual Response =  - - - -   Depression Screen PHQ 2/9 Scores 01/06/2017 12/09/2016 10/08/2015 07/23/2015  PHQ - 2 Score 0 1 0 0  PHQ- 9 Score 0 3 - -  Exception Documentation - - - -    Cognitive Function Ad8 score reviewed for issues:  Issues making decisions:  Less  interest in hobbies / activities:  Repeats questions, stories (family complaining):  Trouble using ordinary gadgets (microwave, computer, phone):  Forgets the month or year:   Mismanaging finances:   Remembering appts:  Daily problems with thinking and/or memory: Ad8  score is=         Immunization History  Administered Date(s) Administered  . Fluad Quad(high Dose 65+) 12/22/2018  . Influenza,inj,Quad PF,6+ Mos 12/02/2012, 11/15/2014, 11/28/2016  . Pneumococcal Conjugate-13 10/05/2014  . Pneumococcal Polysaccharide-23 12/02/2012, 12/22/2018  . Tdap 07/16/2011    Screening Tests Health Maintenance  Topic Date Due  . COLONOSCOPY  12/02/2020  . TETANUS/TDAP  07/15/2021  . INFLUENZA VACCINE  Completed  . Hepatitis C Screening  Completed  . PNA vac Low Risk Adult  Completed       Plan:   ***  I have personally reviewed and noted the following in the patient's chart:   . Medical and social history . Use of alcohol, tobacco or illicit drugs  . Current medications and supplements . Functional ability and status . Nutritional status . Physical activity . Advanced directives . List of other physicians . Hospitalizations, surgeries, and ER visits in previous 12 months . Vitals . Screenings to include cognitive, depression, and falls . Referrals and appointments  In addition, I have reviewed and discussed with patient certain preventive protocols, quality metrics, and best practice recommendations. A written personalized care plan for preventive services as well as general preventive health recommendations were provided to patient.     Shela Nevin, [place]  05/09/2019

## 2019-05-09 NOTE — Progress Notes (Signed)
[facility] Progress Note     Subjective: Patient recently seen by surgery for similar RUQ pain that resolved. Patient does not actually have a diagnosis of cirrhosis but history of Hep C and hepatic steatosis. Reports severe R flank pain this AM and nausea. Last BM was yesterday AM. Still taking Brilinta in the hospital.   Review of Systems  Constitutional: Negative for chills and fever.  Respiratory: Negative for shortness of breath.   Cardiovascular: Negative for chest pain.  Gastrointestinal: Positive for abdominal pain and nausea. Negative for blood in stool, constipation, diarrhea, melena and vomiting.  Genitourinary: Negative for dysuria, frequency and urgency.     Objective: Vital signs in last 24 hours: Temp:  [97.6 F (36.4 C)-98.8 F (37.1 C)] 98.8 F (37.1 C) (03/08 0452) Pulse Rate:  [92-106] 106 (03/08 0452) Resp:  [18-34] 18 (03/08 0452) BP: (111-143)/(62-88) 126/73 (03/08 0452) SpO2:  [97 %-100 %] 99 % (03/08 0452) Weight:  [66.3 kg-68 kg] 66.3 kg (03/08 0452) Last BM Date: 05/08/19  Intake/Output from previous day: No intake/output data recorded. Intake/Output this shift: No intake/output data recorded.  PE: General: pleasant, WD, WN white male who is laying in bed in NAD HEENT:  Sclera are anicteric.  PERRL.  Ears and nose without any masses or lesions.  Mouth is pink and moist Heart: regular, rate, and rhythm.  Normal s1,s2. No obvious murmurs, gallops, or rubs noted.  Palpable radial and pedal pulses bilaterally Lungs: CTAB, no wheezes, rhonchi, or rales noted.  Respiratory effort nonlabored Abd: soft, NT, ND, +BS, no masses, hernias, or organomegaly MS: all 4 extremities are symmetrical with no cyanosis, clubbing, or edema. Skin: warm and dry with no masses, lesions, or rashes Neuro: Cranial nerves 2-12 grossly intact, sensation intact Psych: A&Ox3 with an appropriate affect.   Lab Results:  Recent Labs    05/09/19 0122 05/09/19 0438   WBC 10.6* 11.5*  HGB 14.5 14.5  HCT 44.3 44.1  PLT 269 248   BMET Recent Labs    05/09/19 0122 05/09/19 0438  NA 134* 135  K 4.1 4.4  CL 99 102  CO2 21* 22  GLUCOSE 192* 123*  BUN 12 10  CREATININE 0.86 0.88  CALCIUM 9.3 9.2   PT/INR No results for input(s): LABPROT, INR in the last 72 hours. CMP     Component Value Date/Time   NA 135 05/09/2019 0438   K 4.4 05/09/2019 0438   CL 102 05/09/2019 0438   CO2 22 05/09/2019 0438   GLUCOSE 123 (H) 05/09/2019 0438   BUN 10 05/09/2019 0438   CREATININE 0.88 05/09/2019 0438   CALCIUM 9.2 05/09/2019 0438   PROT 7.7 05/09/2019 0438   ALBUMIN 3.2 (L) 05/09/2019 0438   AST 25 05/09/2019 0438   ALT 42 05/09/2019 0438   ALKPHOS 156 (H) 05/09/2019 0438   BILITOT 1.7 (H) 05/09/2019 0438   GFRNONAA >60 05/09/2019 0438   GFRAA >60 05/09/2019 0438   Lipase     Component Value Date/Time   LIPASE 18 05/09/2019 0122       Studies/Results: US Abdomen Limited RUQ  Result Date: 05/09/2019 CLINICAL DATA:  Initial evaluation for acute right upper quadrant pain. EXAM: ULTRASOUND ABDOMEN LIMITED RIGHT UPPER QUADRANT COMPARISON:  Recent MRI from 05/02/2019. FINDINGS: Gallbladder: Gallbladder distended measuring up to 13.2 cm in length. Layering echogenic material within the gallbladder lumen consistent with sludge. Multiple scattered superimposed tiny stones indicated by sonographer. Gallbladder wall mildly thickened up to 4 mm. No free  pericholecystic fluid. Positive sonographic Murphy sign elicited on exam. Common bile duct: Diameter: 6.7 mm. At least 1 small stone measuring up to 3.6 mm present within the common bile duct. Probable adjacent sludge noted as well. Liver: No focal lesion identified. Within normal limits in parenchymal echogenicity. Portal vein is patent on color Doppler imaging with normal direction of blood flow towards the liver. Other: None. IMPRESSION: 1. Distended gallbladder with internal stones and sludge, with  associated mild gallbladder wall thickening and positive sonographic Murphy sign. Clinical correlation for possible acute cholecystitis recommended. 2. At least 1 small stone and sludge within the common bile duct which measures up to 6.7 mm. Electronically Signed   By: Jeannine Boga M.D.   On: 05/09/2019 02:26    Anti-infectives: Anti-infectives (From admission, onward)   Start     Dose/Rate Route Frequency Ordered Stop   05/09/19 0345  piperacillin-tazobactam (ZOSYN) IVPB 3.375 g     3.375 g 100 mL/hr over 30 Minutes Intravenous  Once 05/09/19 0342 05/09/19 0440       Assessment/Plan Recent MI  PTCA with stenting 04/24/2019 Dr. Peter Martinique Anticoagulation aspirin/Brilinta  Hx alcohol abuse Hx depression  Choledocholithiasis Possible cholecystitis Hx hepatitis C/chronic steatosis - AST/ALT both within normal parameters, Alk Phos elevated at 156 - T bilirubin 1.7 - CBD stone visualized on RUQ [place] yesterday - recommend GI consult for ERCP - WBC 11.5, afebrile - HIDA was non-diagnostic 2/27 so I doubt repeating would be of much significance - if GI unable to decompress with ERCP and still concerned for cholecystitis, may need IR consult for possible cholecystostomy  - surgery is higher risk in setting of recent MI and anticoagulation  FEN: NPO ID: None DVT: Brilinta Follow-up: TBD  Plan: GI consult for choledocholithiasis on RUQ [place]. Recommend cardiology consult to determine risk surrounding any procedures and whether Brilinta can be held or reversed at all. No indications for acute surgical intervention at this time.   LOS: 0 days    Brigid Re , [facility] 05/09/2019, 7:49 AM Please see Amion for pager number during day hours 7:00am-4:30pm

## 2019-05-09 NOTE — ED Provider Notes (Signed)
[facility] EMERGENCY DEPARTMENT Provider Note   CSN: [id number] Arrival date & time: 05/09/19  0035     History Chief Complaint  Patient presents with  . Abdominal Pain    Hunter Mcmillan is a 69 y.o. male.  The history is provided by the patient and medical records.  Abdominal Pain Associated symptoms: nausea     69 y.o. M with hx of alcoholism, CAD with recent stentint x2 of RCA on 04/24/19, depression, hyperlipidemia, hypertension, prior stroke, presenting to the ED with abdominal pain.  Reports discharge from this facility earlier this month for abdominal pain and diagnosed with gallstones.  States he has been in a holding pattern with this due to his recent cardiac issues.  States it is the exact same pain he had last time.  He reports associated nausea but no vomiting.  He was able to eat cereal this morning but has not had much else today.  Pain is worse with eating.  No fever, cough, chest pain, or SOB.  No sick contacts.  He has been taking ibuprofen and pepto bismol without any significant relief.  GI-- [facility], Dr. Cristina Gong  Past Medical History:  Diagnosis Date  . Abdominal pain 05/02/2019  . Alcoholism ([place])   . CAD (coronary artery disease)    a. 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 Chicken pox   . Depression   . ETOH abuse   . Genital warts   . Hepatic steatosis   . Hepatitis C    a. s/p [name] ~ 2003 in [place], [place] => stopped after 6 mos due to neutropenia - viral load noted to be low at that time;  no follow up since;   b. abdominal u/s 11/13:  diffuse hepatic steatosis and/or hepatocellular disease    . Hx of echocardiogram    a. Echo  01/06/12: Mild focal basal hypertrophy the septum, vigorous LV, EF 65-70%, Gr 2 diast dysfn, trivial AI, MAC, trivial MR, trivial TR, PASP 36  . Hyperlipidemia   . Hypertension   . Non-STEMI (non-ST elevated myocardial infarction) ([place])   . Stroke [facility])    "minor" pt reported  . Thrombocytopenia ([place])   . Tobacco abuse     Patient Active Problem List   Diagnosis Date Noted  . Cholelithiases 04/30/2019  . Cholelithiasis 04/30/2019  . RUQ abdominal pain   . Presence of drug-eluting stent in right coronary artery 04/25/2019  . Hyperlipidemia with target LDL less than 70 04/25/2019  . Acute inferior STEMI 04/25/2019  . STEMI involving right coronary artery ([place]) 04/24/2019  . Abdominal aortic aneurysm (AAA) 3.0 cm to 5.5 cm in diameter in male ([place]) 01/06/2017  . Essential hypertension 04/09/2015  . Hyperpigmentation of skin 10/05/2014  . COPD (chronic obstructive pulmonary disease) ([place]) 06/26/2014  . Sacroiliac joint dysfunction of right side 06/26/2014  . Alcoholism ([place])   . Cocaine abuse ([place]) 11/18/2013  . Scrotal mass 07/21/2013  . Cirrhosis ([employer]) 05/28/2013  . Routine general medical examination at a health care facility 07/19/2012  . Abnormal TSH 04/09/2012  . Coronary artery disease involving native heart with unstable angina pectoris ([place]) 01/14/2012  .  Hx of non-ST elevation myocardial infarction (NSTEMI) 01/05/2012  . Tobacco abuse 01/05/2012  . Chronic hepatitis C without hepatic coma ([place]) 01/05/2012  . Depression, recurrent ([place]) 01/05/2012  . Thrombocytopenia ([place]) 01/05/2012    Past Surgical History:  Procedure Laterality Date  . CORONARY ANGIOGRAPHY N/A 04/24/2019   Procedure: CORONARY ANGIOGRAPHY;  Surgeon: Martinique, Peter M, MD;  Location: [facility];  Service: Cardiovascular;  Laterality: N/A;  . CORONARY/GRAFT ACUTE MI REVASCULARIZATION N/A 04/24/2019   Procedure: Coronary/Graft Acute MI Revascularization;  Surgeon: Martinique, Peter M, MD;  Location: [facility];  Service: Cardiovascular;  Laterality: N/A;  . EYE SURGERY  1954   corrective surgery for crossed eyes  . EYE SURGERY  1959  . LEFT HEART CATH AND CORONARY ANGIOGRAPHY N/A 04/24/2019   Procedure: LEFT HEART CATH AND CORONARY ANGIOGRAPHY;  Surgeon: Martinique, Peter M, MD;  Location: [facility];  Service: Cardiovascular;  Laterality: N/A;  . LEFT HEART CATHETERIZATION WITH CORONARY ANGIOGRAM N/A 01/05/2012   Procedure: LEFT HEART CATHETERIZATION WITH CORONARY ANGIOGRAM;  Surgeon: Larey Dresser, MD;  Location: [facility] CATH LAB;  Service: Cardiovascular;  Laterality: N/A;       Family History  Problem Relation Age of Onset  . Heart attack Mother 48  . Diabetes Mother   . Heart disease Mother   . Hyperlipidemia Father   . Hypertension Father   . Diabetes Maternal Grandfather   . Heart disease Maternal Grandfather   . Early death Maternal Grandfather   . Heart attack Maternal Grandfather   . Hearing loss Sister   . Hyperlipidemia Sister   . Depression Brother   . Diabetes Brother   . Hyperlipidemia Brother   . Early death Maternal Grandmother   . Heart disease Maternal Grandmother   . Heart attack Maternal Grandmother   . Hearing loss Sister     Social History   Tobacco Use  . Smoking status: Light Tobacco Smoker    Packs/day: 0.10    Types: Cigarettes  . Smokeless tobacco: Never Used  . Tobacco comment: almost quit, very rare, 1-2 daily  Substance Use Topics  . Alcohol use: Not Currently    Alcohol/week: 0.0 standard drinks    Comment: Last drink: 10 days ago   . Drug use: Not Currently    Types: Cocaine    Comment: history of cocaine last use 2014    Home Medications Prior to Admission medications   Medication Sig Start Date End Date Taking? Authorizing Provider  albuterol (VENTOLIN HFA) 108 (90 Base) MCG/ACT inhaler Inhale 2 puffs into the lungs every 6 (six) hours as needed for wheezing or shortness of breath. 04/26/19   Leanor Kail, PA    aspirin EC 81 MG tablet Take 81 mg by mouth daily.    [provider]  atorvastatin (LIPITOR) 80 MG tablet Take 1 tablet (80 mg total) by mouth daily at 6 PM. 04/26/19   Bhagat, Bhavinkumar, PA  colchicine 0.6 MG tablet Take 1 tablet (0.6 mg total) by mouth daily. 04/27/19   Bhagat, Crista Luria, PA  cyclobenzaprine (FLEXERIL) 10 MG tablet TAKE 1 TABLET BY MOUTH TWICE DAILY AS NEEDED FOR MUSCLE SPASM Patient taking differently: Take 10 mg by mouth daily as needed for muscle spasms.  01/12/19   Copland, Gay Filler, MD  fluticasone (FLOVENT HFA) 110 MCG/ACT inhaler Inhale 2 puffs into the lungs 2 (two) times daily. 04/26/19 04/25/20  Leanor Kail, PA  metoprolol succinate (TOPROL-XL) 25 MG 24 hr tablet Take  1 tablet (25 mg total) by mouth daily. 04/26/19   Leanor Kail, PA  Multiple Vitamin (MULTIVITAMIN WITH MINERALS) TABS tablet Take 1 tablet by mouth daily.    [provider]  nitroGLYCERIN (NITROSTAT) 0.4 MG SL tablet Place 1 tablet (0.4 mg total) under the tongue every 5 (five) minutes as needed for chest pain. 04/26/19   Leanor Kail, PA  ticagrelor (BRILINTA) 90 MG TABS tablet Take 1 tablet (90 mg total) by mouth 2 (two) times daily. 04/26/19   Leanor Kail, PA    Allergies    Patient has no known allergies.  Review of Systems   Review of Systems  Gastrointestinal: Positive for abdominal pain and nausea.  All other systems reviewed and are negative.   Physical Exam Updated Vital Signs BP 130/88 (BP Location: Right Arm)   Pulse 92   Temp 97.6 F (36.4 C) (Oral)   Resp 20   SpO2 99%   Physical Exam Vitals and nursing note reviewed.  Constitutional:      Appearance: He is well-developed.     Comments: Appears uncomfortable  HENT:     Head: Normocephalic and atraumatic.  Eyes:     Conjunctiva/sclera: Conjunctivae normal.     Pupils: Pupils are equal, round, and reactive to light.  Cardiovascular:     Rate and Rhythm: Normal rate and  regular rhythm.     Heart sounds: Normal heart sounds.  Pulmonary:     Effort: Pulmonary effort is normal.     Breath sounds: Normal breath sounds.  Abdominal:     General: Bowel sounds are normal.     Palpations: Abdomen is soft.     Tenderness: There is abdominal tenderness in the right upper quadrant and epigastric area.     Comments: Soft, TTP RUQ > epigastric  Musculoskeletal:        General: Normal range of motion.     Cervical back: Normal range of motion.  Skin:    General: Skin is warm and dry.  Neurological:     Mental Status: He is alert and oriented to person, place, and time.     ED Results / Procedures / Treatments   Labs (all labs ordered are listed, but only abnormal results are displayed) Labs Reviewed  CBC WITH DIFFERENTIAL/PLATELET - Abnormal; Notable for the following components:      Result Value   WBC 10.6 (*)    Neutro Abs 8.8 (*)    All other components within normal limits  COMPREHENSIVE METABOLIC PANEL - Abnormal; Notable for the following components:   Sodium 134 (*)    CO2 21 (*)    Glucose, Bld 192 (*)    Albumin 3.3 (*)    ALT 48 (*)    Alkaline Phosphatase 158 (*)    Total Bilirubin 1.7 (*)    All other components within normal limits  URINALYSIS, ROUTINE W REFLEX MICROSCOPIC - Abnormal; Notable for the following components:   Color, Urine AMBER (*)    Glucose, UA 50 (*)    Protein, ur 100 (*)    Bacteria, UA RARE (*)    All other components within normal limits  TROPONIN I (HIGH SENSITIVITY) - Abnormal; Notable for the following components:   Troponin I (High Sensitivity) 45 (*)    All other components within normal limits  RESPIRATORY PANEL BY RT PCR (FLU A&B, COVID)  LIPASE, BLOOD    EKG None  Radiology US Abdomen Limited RUQ  Result Date: 05/09/2019 CLINICAL DATA:  Initial  evaluation for acute right upper quadrant pain. EXAM: ULTRASOUND ABDOMEN LIMITED RIGHT UPPER QUADRANT COMPARISON:  Recent MRI from 05/02/2019. FINDINGS:  Gallbladder: Gallbladder distended measuring up to 13.2 cm in length. Layering echogenic material within the gallbladder lumen consistent with sludge. Multiple scattered superimposed tiny stones indicated by sonographer. Gallbladder wall mildly thickened up to 4 mm. No free pericholecystic fluid. Positive sonographic Murphy sign elicited on exam. Common bile duct: Diameter: 6.7 mm. At least 1 small stone measuring up to 3.6 mm present within the common bile duct. Probable adjacent sludge noted as well. Liver: No focal lesion identified. Within normal limits in parenchymal echogenicity. Portal vein is patent on color Doppler imaging with normal direction of blood flow towards the liver. Other: None. IMPRESSION: 1. Distended gallbladder with internal stones and sludge, with associated mild gallbladder wall thickening and positive sonographic Murphy sign. Clinical correlation for possible acute cholecystitis recommended. 2. At least 1 small stone and sludge within the common bile duct which measures up to 6.7 mm. Electronically Signed   By: Jeannine Boga M.D.   On: 05/09/2019 02:26    Procedures Procedures (including critical care time)  Medications Ordered in ED Medications  ondansetron (ZOFRAN) injection 4 mg (4 mg Intravenous Given 05/09/19 0150)  fentaNYL (SUBLIMAZE) injection 50 mcg (50 mcg Intravenous Given 05/09/19 0150)    ED Course  I have reviewed the triage vital signs and the nursing notes.  Pertinent labs & imaging results that were available during my care of the patient were reviewed by me and considered in my medical decision making (see chart for details).    MDM Rules/Calculators/A&P  69 y.o. M here with RUQ pain.  Admitted earlier in the month for same, diagnosed with gallstones, but normal HIDA scan and MRCP.  Not felt to be an operative candidate given recent STEMI with continued elevated troponins and antiplatelets.  Returns today due to worsening pain today, states it feels  exactly as before.  He is afebrile and nontoxic but does appear very uncomfortable.  He does have tenderness in the epigastrium, but more so in the right upper quadrant.  Remainder of abdomen is soft and benign.  Labs as above, mild leukocytosis.  Alk phos is elevated but decreased from prior, same with bilirubin at 1.7 today.  Troponin is 45, this is a significant increase from prior of 1043.  [place] with distended gallbladder with stones and sludge, mild wall thickening, and + Murphy's sign.  Also noted at least 1 stone in the CBD which is measuring up to 6.46m.    Patient will need repeat medical admission, GI and surgery consultation.  I discussed this with him and he acknowledged understanding.  He is feeling much better here after fentanyl and Zofran.  His vitals remained stable.  Will repeat rapid Covid swab as he likely will need procedural intervention.  Discussed with Dr. RRosendo Gros- will see in consult, agrees with medical admission.  We will start abx.  Discussed with hospitalist, Dr. DShanon Brow- will admit for ongoing care.  COVID screen negative.  Final Clinical Impression(s) / ED Diagnoses Final diagnoses:  RUQ pain  Choledocholithiasis    Rx / DC Orders ED Discharge Orders    None       SLarene Pickett PA-C [id number]   GDelora Fuel MD [id number]

## 2019-05-09 NOTE — Consult Note (Addendum)
Cardiology Consultation:   Patient ID: [name] MRN: [id number]; DOB: 1950-09-20  Admit date: 05/09/2019 Date of Consult: 05/09/2019  Primary Care Provider: [name], MD Primary Cardiologist: Peter Martinique, MD  Primary Electrophysiologist:  None    Patient Profile:   Hunter Mcmillan is a 69 y.o. male with a hx of CAD with recent STEMI (04/26/19) successful PCI of the RCA with DES x2, HTN, HL, Stroke, ETOH, HVC/ETOH, COPD and tobacco use who is being seen today for the evaluation of management of DAPT in the setting of abd pain and invasive testing at the request of Dr. Horris Latino.  History of Present Illness:   Hunter Mcmillan is a 69 yo male with PMH noted above. 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 Decision was made to manage medically. Most recently was admitted on 04/24/19 with acute onset of SSCP and SOB. Reported he was recently evaluated for gallbladder disease and surgery was recommended but had not completed. He was found to have a STEMI and taken for cath with 100% occlusion of the pRCA with DES x2 and heavy thrombus in the LCx. Decision made to treat LCx medically. He was placed on DAPT with ASA/Brilinta. Echo showed EF of 40-45% with G1DD. Placed on BB and high dose statin. He was taken back to the cath lab for recurrent chest pain but RCA stents were patent. Started on colchicine for possible pericarditis. Due to follow up in the office on 3/19.   He presented back to the ED on 04/30/19 with complaints of abd pain. Had elevated LFTs on admission which trended down. Also had heme + stools and seen by GI with recommendations to continue on protonix. Seen by general surgery for symptomatic cholelithiasis. HIDA scan was non diagnostic and MRCP was unremarkable. Recommendations were for symptom management and no indication for surgical management at that time.   Presented back to the ED on 05/09/19 with abd  pain and n/v. States he had been treating with ibuprofen 83m TID prior to admission. Labs showed stable electrolytes, Cr 0.86, normal AST/ALT,  hsTn 45>>47, WBC 10.6>>11.5. CXR negative. EKG on admission showed SR with TWI in inferolateral leads. He was evaluated by general surgery who recommended GI consult for possible ERCP. GI consulted today and would like to plan for Friday. Cardiology has been asked to weight in on possibility of holding his Brilinta.   From a cardiac standpoint he reports doing well, no chest pain and his breathing has much improved since his cath.   Heart Pathway Score:     Past Medical History:  Diagnosis Date  . Abdominal pain 05/02/2019  . [place]ism ([place]   . CAD (coronary artery disease)    a. 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 Chicken pox   . Depression   . ETOH abuse   . Genital warts   . Hepatic steatosis   . Hepatitis C    a. [name] 6 mos due to neutropenia - viral load noted to be low at that time;  no follow up since;   b. abdominal u/s 11/13:  diffuse hepatic steatosis and/or hepatocellular disease    . Hx of echocardiogram    a. Echo 01/06/12: Mild focal basal hypertrophy the septum, vigorous LV, EF 65-70%, Gr 2 diast dysfn, trivial AI, MAC, trivial MR, trivial TR, PASP 36  . Hyperlipidemia   . Hypertension   . Non-STEMI (non-ST elevated myocardial infarction) ([place])   . Stroke [facility])    "minor" pt reported  . Thrombocytopenia ([place])   . Tobacco abuse     Past Surgical History:  Procedure Laterality Date  . CORONARY ANGIOGRAPHY N/A 04/24/2019   Procedure: CORONARY ANGIOGRAPHY;  Surgeon: Martinique,  Peter M, MD;  Location: [facility];  Service: Cardiovascular;  Laterality: N/A;  . CORONARY/GRAFT ACUTE MI REVASCULARIZATION N/A 04/24/2019   Procedure: Coronary/Graft Acute MI Revascularization;  Surgeon: Martinique, Peter M, MD;  Location: [facility];  Service: Cardiovascular;  Laterality: N/A;  . EYE SURGERY  1954   corrective surgery for crossed eyes  . EYE SURGERY  1959  . LEFT HEART CATH AND CORONARY ANGIOGRAPHY N/A 04/24/2019   Procedure: LEFT HEART CATH AND CORONARY ANGIOGRAPHY;  Surgeon: Martinique, Peter M, MD;  Location: [facility];  Service: Cardiovascular;  Laterality: N/A;  . LEFT HEART CATHETERIZATION WITH CORONARY ANGIOGRAM N/A 01/05/2012   Procedure: LEFT HEART CATHETERIZATION WITH CORONARY ANGIOGRAM;  Surgeon: Larey Dresser, MD;  Location: [facility] CATH LAB;  Service: Cardiovascular;  Laterality: N/A;     Home Medications:  Prior to Admission medications   Medication Sig Start Date End Date Taking? Authorizing Provider  albuterol (VENTOLIN HFA) 108 (90 Base) MCG/ACT inhaler Inhale 2 puffs into the lungs every 6 (six) hours as needed for wheezing or shortness of breath. 04/26/19  Yes Bhagat, Bhavinkumar, PA  aspirin EC 81 MG tablet Take 81 mg by mouth daily.   Yes [provider]  atorvastatin (LIPITOR) 80 MG tablet Take 1 tablet (80 mg total) by mouth daily at 6 PM. 04/26/19  Yes Bhagat, Bhavinkumar, PA  colchicine 0.6 MG tablet Take 1 tablet (0.6 mg total) by mouth daily. 04/27/19  Yes Bhagat, Bhavinkumar, PA  cyclobenzaprine (FLEXERIL) 10 MG tablet TAKE 1 TABLET BY MOUTH TWICE DAILY AS NEEDED FOR MUSCLE SPASM Patient taking differently: Take 10 mg by mouth daily as needed for muscle spasms.  01/12/19  Yes Copland, Gay Filler, MD  fluticasone (FLOVENT HFA) 110 MCG/ACT inhaler Inhale 2 puffs into the lungs 2 (two) times daily. 04/26/19 04/25/20 Yes Bhagat, Bhavinkumar, PA  metoprolol succinate (TOPROL-XL) 25 MG 24 hr tablet Take 1 tablet (25 mg total) by mouth  daily. 04/26/19  Yes Bhagat, Bhavinkumar, PA  Multiple Vitamin (MULTIVITAMIN WITH MINERALS) TABS tablet Take 1 tablet by mouth daily.   Yes [provider]  nitroGLYCERIN (NITROSTAT) 0.4 MG SL tablet Place 1 tablet (0.4 mg total) under the tongue every 5 (five) minutes as needed for chest pain. 04/26/19  Yes Bhagat, Bhavinkumar, PA  ticagrelor (BRILINTA) 90 MG TABS tablet Take 1 tablet (90 mg total) by mouth 2 (two) times daily. 04/26/19  Yes Bhagat, Crista Luria, PA    Inpatient Medications: Scheduled Meds: . aspirin EC  81 mg Oral Daily  . atorvastatin  80 mg Oral q1800  . budesonide  0.5 mg Inhalation BID  . colchicine  0.6 mg Oral Daily  . mouth rinse  15 mL Mouth Rinse BID  . metoprolol succinate  25 mg Oral Daily  . multivitamin with minerals  1 tablet Oral Daily  . ticagrelor  90 mg Oral BID   Continuous Infusions: . sodium chloride 75 mL/hr at 05/09/19 0518  . piperacillin-tazobactam (ZOSYN)  IV 3.375 g (05/09/19 0950)   PRN Meds: albuterol, cyclobenzaprine, fentaNYL (SUBLIMAZE) injection, morphine injection, nitroGLYCERIN, ondansetron **OR** ondansetron (ZOFRAN) IV, oxyCODONE  Allergies:   No Known Allergies  Social History:   Social History   Socioeconomic History  . Marital status: Single    Spouse name: Not on file  . Number of children: 1  . Years of education: Not on file  . Highest education level: Bachelor's degree (e.g., BA, AB, BS)  Occupational History  . Occupation: Music therapist: [employer]    Comment: retired  Tobacco Use  . Smoking status: Light Tobacco Smoker    Packs/day: 0.10    Types: Cigarettes  . Smokeless tobacco: Never Used  . Tobacco comment: almost quit, very rare, 1-2 daily  Substance and Sexual Activity  . Alcohol use: Not Currently    Alcohol/week: 0.0 standard drinks    Comment: Last drink: 10 days ago   . Drug use: Not Currently    Types: Cocaine    Comment: history of cocaine last use 2014  . Sexual  activity: Yes    Partners: Female    Comment: pt. declined condoms  Other Topics Concern  . Not on file  Social History Narrative  . Not on file   Social Determinants of Health   Financial Resource Strain:   . Difficulty of Paying Living Expenses: Not on file  Food Insecurity:   . Worried About Charity fundraiser in the Last Year: Not on file  . Ran Out of Food in the Last Year: Not on file  Transportation Needs:   . Lack of Transportation (Medical): Not on file  . Lack of Transportation (Non-Medical): Not on file  Physical Activity:   . Days of Exercise per Week: Not on file  . Minutes of Exercise per Session: Not on file  Stress:   . Feeling of Stress : Not on file  Social Connections:   . Frequency of Communication with Friends and Family: Not on file  . Frequency of Social Gatherings with Friends and Family: Not on file  . Attends Religious Services: Not on file  . Active Member of Clubs or Organizations: Not on file  . Attends Archivist Meetings: Not on file  . Marital Status: Not on file  Intimate Partner Violence:   . Fear of Current or Ex-Partner: Not on file  . Emotionally Abused: Not on file  . Physically Abused: Not on file  . Sexually Abused: Not on file    Family History:    Family History  Problem Relation Age of Onset  . Heart attack Mother 56  . Diabetes Mother   . Heart disease Mother   . Hyperlipidemia Father   . Hypertension Father   . Diabetes Maternal Grandfather   . Heart disease Maternal Grandfather   . Early death Maternal Grandfather   . Heart attack Maternal Grandfather   . Hearing loss Sister   . Hyperlipidemia Sister   . Depression Brother   . Diabetes Brother   . Hyperlipidemia Brother   . Early death Maternal Grandmother   . Heart disease Maternal Grandmother   . Heart attack Maternal Grandmother   . Hearing loss Sister      ROS:  Please see the history of present illness.   All other  ROS reviewed and negative.      Physical Exam/Data:   Vitals:   05/09/19 0315 05/09/19 0330 05/09/19 0452 05/09/19 0845  BP: 132/77 111/62 126/73   Pulse: 97 (!) 105 (!) 106   Resp: 20 (!) 26 18   Temp:   98.8 F (37.1 C)   TempSrc:   Oral   SpO2: 98% 97% 99% 99%  Weight:   66.3 kg   Height:   _0  (1.803 m)     Intake/Output Summary (Last 24 hours) at 05/09/2019 1239 Last data filed at 05/09/2019 1117 Gross per 24 hour  Intake 0 ml  Output 75 ml  Net -75 ml   Last 3 Weights 05/09/2019 05/09/2019 04/30/2019  Weight (lbs) 146 lb 2.6 oz 150 lb 149 lb 7.6 oz  Weight (kg) 66.3 kg 68.04 kg 67.8 kg     Body mass index is 20.39 kg/m.  General:  Thin older WM, in no acute distress HEENT: normal Lymph: no adenopathy Neck: no JVD Endocrine:  No thryomegaly Vascular: No carotid bruits; FA pulses 2+ bilaterally without bruits  Cardiac:  normal S1, S2; RRR; no murmur  Lungs:  clear to auscultation bilaterally, no wheezing, rhonchi or rales  Abd: soft, nontender, no hepatomegaly  Ext: no edema Musculoskeletal:  No deformities, BUE and BLE strength normal and equal Skin: warm and dry  Neuro:  CNs 2-12 intact, no focal abnormalities noted Psych:  Normal affect   EKG:  The EKG was personally reviewed and demonstrates:  SR with TWI in inferolateral leads (evolving from prior STEMI 2/21)  Relevant CV Studies:  Echo: 04/24/19  IMPRESSIONS    1. Left ventricular ejection fraction, by estimation, is 40 to 45%. The  left ventricle has mildly decreased function. The left ventricle  demonstrates regional wall motion abnormalities (see scoring  diagram/findings for description). There is mild left  ventricular hypertrophy. Left ventricular diastolic parameters are  consistent with Grade I diastolic dysfunction (impaired relaxation).  2. Right ventricular systolic function is severely reduced. The right  ventricular size is mildly enlarged. There is mildly elevated pulmonary  artery systolic pressure.  3. The  mitral valve is grossly normal. Trivial mitral valve  regurgitation.  4. Tricuspid valve regurgitation is mild to moderate.  5. The aortic valve has an indeterminant number of cusps. Aortic valve  regurgitation is not visualized.  6. The inferior vena cava is dilated in size with <50% respiratory  variability, suggesting right atrial pressure of 15 mmHg.   Cath: 04/24/19   Ost LM lesion is 25% stenosed.  Mid LAD lesion is 25% stenosed.  Prox Cx lesion is 90% stenosed.  Ost RCA to Mid RCA lesion is 100% stenosed.  A drug-eluting stent was successfully placed using a STENT RESOLUTE ONYX 3.5X38.  A drug-eluting stent was successfully placed using a STENT RESOLUTE ONYX 3.5X30.  Post intervention, there is a 0% residual stenosis.  LV end diastolic pressure is normal.   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 Severe 2 vessel CAD    - culprit lesion is 100% occlusion of the proximal RCA with heavy thrombus    - 90% small LCx 3. Normal LVEDP 4. Successful PCI of the RCA with thrombectomy and stenting with DES x 2.  Plan: DAPT for at least one year. IV Aggrastat for 18 hours. Start beta blocker and high dose statin. Assess LV function by Echo.     Diagnostic Dominance: Right  Intervention     Laboratory  Data:  High Sensitivity Troponin:   Recent Labs  Lab 04/24/19 1100 04/30/19 0733 04/30/19 1245 05/09/19 0122 05/09/19 0830  TROPONINIHS 18,332* 1,335* 1,043* 45* 47*     Chemistry Recent Labs  Lab 05/03/19 0352 05/09/19 0122 05/09/19 0438  NA 133* 134* 135  K 3.6 4.1 4.4  CL 103 99 102  CO2 22 21* 22  GLUCOSE 112* 192* 123*  BUN _0 CREATININE 0.86 0.86 0.88  CALCIUM 8.8* 9.3 9.2  GFRNONAA >60 >60 >60  GFRAA >60 >60 >60  ANIONGAP _1 Recent Labs  Lab 05/03/19 0352 05/09/19 0122 05/09/19 0438  PROT 6.5 7.6 7.7  ALBUMIN 2.8* 3.3* 3.2*  AST 67* 34 25  ALT 167* 48* 42  ALKPHOS 261* 158* 156*  BILITOT 2.6* 1.7* 1.7*    Hematology Recent Labs  Lab 05/03/19 0352 05/09/19 0122 05/09/19 0438  WBC 6.8 10.6* 11.5*  RBC 4.08* 4.44 4.45  HGB 13.5 14.5 14.5  HCT 39.4 44.3 44.1  MCV 96.6 99.8 99.1  MCH 33.1 32.7 32.6  MCHC 34.3 32.7 32.9  RDW 13.3 14.0 14.0  PLT 169 269 248   BNPNo results for input(s): BNP, PROBNP in the last 168 hours.  DDimer No results for input(s): DDIMER in the last 168 hours.   Radiology/Studies:  US Abdomen Limited RUQ  Result Date: 05/09/2019 CLINICAL DATA:  Initial evaluation for acute right upper quadrant pain. EXAM: ULTRASOUND ABDOMEN LIMITED RIGHT UPPER QUADRANT COMPARISON:  Recent MRI from 05/02/2019. FINDINGS: Gallbladder: Gallbladder distended measuring up to 13.2 cm in length. Layering echogenic material within the gallbladder lumen consistent with sludge. Multiple scattered superimposed tiny stones indicated by sonographer. Gallbladder wall mildly thickened up to 4 mm. No free pericholecystic fluid. Positive sonographic Murphy sign elicited on exam. Common bile duct: Diameter: 6.7 mm. At least 1 small stone measuring up to 3.6 mm present within the common bile duct. Probable adjacent sludge noted as well. Liver: No focal lesion identified. Within normal limits in parenchymal echogenicity. Portal vein is patent on color Doppler imaging with normal direction of blood flow towards the liver. Other: None. IMPRESSION: 1. Distended gallbladder with internal stones and sludge, with associated mild gallbladder wall thickening and positive sonographic Murphy sign. Clinical correlation for possible acute cholecystitis recommended. 2. At least 1 small stone and sludge within the common bile duct which measures up to 6.7 mm. Electronically Signed   By: Jeannine Boga M.D.   On: 05/09/2019 02:26    Assessment and Plan:   Hunter Mcmillan is a 69 y.o. male with a hx of CAD with recent STEMI (04/26/19) successful PCI of the RCA with DES x2, HTN, HL, Stroke, ETOH, HVC/ETOH, COPD and  tobacco use who is being seen today for the evaluation of management of DAPT in the setting of abd pain and invasive testing at the request of Dr. Horris Latino.  1. CAD with recent STEMI: admitted on 2/21 with inferior STEMI with PCI/DESx2 to the RCA, and disease in an anomalous Lcx which was small vessel and treated medically. Has been on DAPT with ASA/Brilinta since that time. Now presents with acute choledocholithiasis, seen by general surgery and GI with plans for possible ERCP later this week. This is a complex situation with his recent STEMI, not even month out with DAPT. Will need bridging with stopping Brilinta.. Discussed with PharmD and plan to start Aggrastat this evening through Friday. Would resume Brilinta post ERCP, suspect he will need a loading dose at  that time.   2. Acute choledocholithiasis/cholelithiasis: this is his second admission for abd pain since his STEMI. As above seen by GI/general surgery who feel he would benefit from ERCP.   3. HTN: stable with current therapy  4. HL: on high dose statin therapy. LFTs stable this admission.    For questions or updates, please contact [place] Please consult [email or web address] for contact info under   Signed, Reino Bellis, NP  05/09/2019 12:39 PM    Patient seen and examined. Agree with assessment and plan.  Hunter Mcmillan is a 69 year old male who has a history of CAD who recently suffered an ST segment elevation MI secondary to total RCA occlusion. He had extensive thrombus and required thrombectomy with ultimate stenting with sequential 3.5x38 and 3.5 x 30 mm Resolute stents.  He was also found to have an anomalous takeoff of the left circumflex coronary artery from the RCA with 90% stenosis in the small circumflex vessel for which initial medical therapy was recommended.  He had mild concomitant CAD involving his left main and LAD.  Patient has had issues with recurrent right upper quadrant discomfort secondary to  cholelithiasis.  He recently was evaluated and had significant LFT elevation which subsequently has resolved.  He was readmitted with recurrent right upper quadrant discomfort and plans are for possible ERCP on Friday.  He had taken his morning dose of Brilinta.  He is only 2 weeks since his STEMI and we will need to transition him to glycoprotein 2b3a inhibition with Aggrastat while Brilinta is on hold to reduce potential for stent thrombosis.  Presently, the patient denies recurrent anginal symptomatology following his successful stenting.  He has significantly reduced his tobacco use but unfortunately had smoked until his present admission.  Earlier this morning, blood pressure was 126/73 with pulse at 106.  Presently, blood pressure is low in the upper 90s/60 range.  Also temperature was increased at last check at 101.  He was afebrile this morning.  There is no JVD.  Lungs are clear without wheezing.  Rhythm is regular without ectopy.  He has definite right upper quadrant tenderness to palpation.  Sounds are positive.  There was no significant edema.  He had grossly normal neurologic exam.  Affect was normal.  ECG shows sinus rhythm at 98 with evolving inferolateral T wave inversion from his prior STEMI ST elevation.  We will need to monitor blood pressure and heart rate.  Patient is not on telemetry.  Recommend telemetry monitoring depending upon blood pressure may need slight titration of beta-blocker therapy if heart rate continues to be elevated.  LFTs have normalized and he continues to be on statin therapy with atorvastatin 80 mg for LDL target less than 70.  I discussed the importance of complete smoking cessation which hopefully has occurred since he is not had any cigarettes for the last 2 days.  We will follow patient.  Aggrastat should be discontinued a minimum of 4 hours prior to planned ERCP.  Brilinta can be given orally once okay following ERCP therapy.  Troy Sine, MD,  [facility] 05/09/2019 2:41 PM

## 2019-05-09 NOTE — Telephone Encounter (Signed)
Pt insurance is active and benefits verified through [facility] Medicare Co-pay 0, DED 0/0 met, out of pocket $3,600/$250 met, co-insurance 0%. no pre-authorization required. Passport, 05/09/2019_0 :09pm, REF# [id number]  Will contact patient to see if he is interested in the Cardiac Rehab Program. If interested, patient will need to complete follow up appt. Once completed, patient will be contacted for scheduling upon review by the RN Navigator.

## 2019-05-09 NOTE — Progress Notes (Signed)
Patient in severe pain 10/10,Morphine 4 mg given with no effect, Dr Onalee Hua notified with new order.

## 2019-05-09 NOTE — Progress Notes (Signed)
Pharmacy Consult Note - Initial Consult  Pharmacy Consult for Aggrastat infusion  Indication:  No Known Allergies  Patient Measurements: Height: _0  (180.3 cm) Weight: 146 lb 2.6 oz (66.3 kg) IBW/kg (Calculated) : 75.3   Vital Signs: Temp: 101.1 F (38.4 C) (03/08 1446) Temp Source: Oral (03/08 1446) BP: 92/51 (03/08 1446) Pulse Rate: 86 (03/08 1446)  Labs: Recent Labs    05/09/19 0122 05/09/19 0438 05/09/19 0830  HGB 14.5 14.5  --   HCT 44.3 44.1  --   PLT 269 248  --   CREATININE 0.86 0.88  --   TROPONINIHS 45*  --  47*    Estimated Creatinine Clearance: 74.3 mL/min (by C-G formula based on SCr of 0.88 mg/dL).   Medical History: Past Medical History:  Diagnosis Date  . Abdominal pain 05/02/2019  . Alcoholism ([place])   . CAD (coronary artery disease)    a. 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 Chicken pox   . Depression   . ETOH abuse   . Genital warts   . Hepatic steatosis   . Hepatitis C    a. s/p [name] ~ 2003 in [place], [place] => stopped after 6 mos due to neutropenia - viral load noted to be low at that time;  no follow up since;   b. abdominal u/s 11/13:  diffuse hepatic steatosis and/or hepatocellular disease    . Hx of echocardiogram    a. Echo 01/06/12: Mild focal basal hypertrophy the septum, vigorous LV, EF 65-70%, Gr 2 diast dysfn, trivial AI, MAC, trivial MR, trivial TR, PASP 36  . Hyperlipidemia   . Hypertension   . Non-STEMI (non-ST elevated myocardial infarction) ([place])   . Stroke [facility])    "minor" pt reported  . Thrombocytopenia ([place])   . Tobacco abuse     Medications:  Medications Prior to Admission  Medication Sig Dispense Refill  Last Dose  . albuterol (VENTOLIN HFA) 108 (90 Base) MCG/ACT inhaler Inhale 2 puffs into the lungs every 6 (six) hours as needed for wheezing or shortness of breath. 18 g 6 05/08/2019 at Unknown time  . aspirin EC 81 MG tablet Take 81 mg by mouth daily.   05/08/2019 at Unknown time  . atorvastatin (LIPITOR) 80 MG tablet Take 1 tablet (80 mg total) by mouth daily at 6 PM. 30 tablet 6 05/08/2019 at Unknown time  . colchicine 0.6 MG tablet Take 1 tablet (0.6 mg total) by mouth daily. 10 tablet 0 05/08/2019 at Unknown time  . cyclobenzaprine (FLEXERIL) 10 MG tablet TAKE 1 TABLET BY MOUTH TWICE DAILY AS NEEDED FOR MUSCLE SPASM (Patient taking differently: Take 10 mg by mouth daily as needed for muscle spasms. ) 30 tablet 3 Past Month at Unknown time  . fluticasone (FLOVENT HFA) 110 MCG/ACT inhaler Inhale 2 puffs into the lungs 2 (two) times daily. 1 Inhaler 3 05/08/2019 at Unknown time  . metoprolol succinate (TOPROL-XL) 25 MG 24 hr tablet Take 1 tablet (25 mg total) by mouth daily. 30 tablet 6 05/08/2019 at 0930  . Multiple Vitamin (MULTIVITAMIN WITH MINERALS) TABS tablet Take 1 tablet by mouth daily.   05/08/2019 at Unknown  time  . nitroGLYCERIN (NITROSTAT) 0.4 MG SL tablet Place 1 tablet (0.4 mg total) under the tongue every 5 (five) minutes as needed for chest pain. 25 tablet 1 unknown  . ticagrelor (BRILINTA) 90 MG TABS tablet Take 1 tablet (90 mg total) by mouth 2 (two) times daily. 60 tablet 11 05/08/2019 at 2300    Assessment: 69 y.o male with with a hx of CAD with recent STEMI (04/26/19) successful PCI of the RCA with DES x2, currently on Brilinta post PCI.  He presented back to ED 05/09/19 with abdominal pain. GI consulted, needs ERCP.  Plan for ERCP on Friday 05/13/19. Brilinta needs to be on hold for at least 5 days.   Per Cardiology, pharmacy consulted to start Aggrastat IV infusion while holding Brilinta, CrCl is >60 ml/min. Will start the Aggrastat infusion when next Brilinta dose would have been due.  Last  Brilinta dose given this morning ~ 10AM.    Goal of Therapy:  Monitor platelets by anticoagulation protocol: Yes   Plan:  At 22:00 tonight start Aggrastat 0.15 mcg/kg/minute. Monitor CBC daily, monitor for bleeding  Thank you for allowing pharmacy to be part of this patients care team. Nicole Cella, [place] Pharmacist [phone] Please check AMION for all [place] phone numbers After 10:00 PM, call [place] [phone] 05/09/2019,3:26 PM

## 2019-05-09 NOTE — Plan of Care (Signed)
  Problem: Education: Goal: Knowledge of General Education information will improve Description: Including pain rating scale, medication(s)/side effects and non-pharmacologic comfort measures Outcome: Progressing   Problem: Health Behavior/Discharge Planning: Goal: Ability to manage health-related needs will improve Outcome: Progressing   Problem: Clinical Measurements: Goal: Ability to maintain clinical measurements within normal limits will improve Outcome: Progressing Goal: Respiratory complications will improve Outcome: Progressing Goal: Cardiovascular complication will be avoided Outcome: Progressing   Problem: Activity: Goal: Risk for activity intolerance will decrease Outcome: Progressing   Problem: Elimination: Goal: Will not experience complications related to urinary retention Outcome: Progressing   Problem: Pain Managment: Goal: General experience of comfort will improve Outcome: Progressing   Problem: Safety: Goal: Ability to remain free from injury will improve Outcome: Progressing   Problem: Skin Integrity: Goal: Risk for impaired skin integrity will decrease Outcome: Progressing   

## 2019-05-09 NOTE — Consult Note (Signed)
[facility] Consult  Referring Provider: Alma Friendly, MD Primary Care Physician:  [name], MD Primary Gastroenterologist:Unassigned  Reason for Consultation: CBD stone  HPI: Hunter Mcmillan is a 69 y.o. male with a past medical history of recent STEMI in 04/24/2019, on Brilinta, presented to the ER with complains of right-sided abdominal pain.  Patient states he had similar pain in August 2020, ultrasound done showed cholelithiasis without acute cholecystitis and possible cirrhosis.  He was advised to follow-up with a surgeon as an outpatient, however making dietary changes, the abdominal pain got better. He developed similar pain in February/21 and ultrasound at that point again showed cholelithiasis without cholecystitis. This is now his fourth presentation with right upper quadrant abdominal pain, ultrasound on this admission from 05/09/2019 showed distended gallbladder measuring 13.2 cm, multiple sludge and stones, thickened gallbladder wall up to 4 mm, CBD of 6.7 mm with one small stone measuring 3.6 mm in common bile duct along with sludge. Patient had an MRI on 05/02/2019 which showed no hepatic lesions, intrahepatic biliary dilatation or cirrhosis but had numerous tiny layering gallstones.  Patient states that the right upper quadrant abdominal pain is improved after taking ibuprofen 800 mg 3 times a day.  He does notice worsening of abdominal pain after eating.  The pain does not radiate, is not associated with nausea, vomiting or fever.  Patient states he has several missing teeth/dentures, and has trouble swallowing solids such as chicken or [place] which feel stuck in mid chest, intermittently, likely once every 2 to 3 months. No prior EGD. No history of early satiety or dysphagia. Patient states he had a colonoscopy few years ago at [facility] for screening and was reportedly normal.  He stopped drinking about a year and a half ago, otherwise would  drink 1 can of 12 ounces of beer daily for the last 40 to 50 years. He has decreased his intake of cigarettes, but continues to smoke 2 to 3 cigarettes a day.   Past Medical History:  Diagnosis Date  . Abdominal pain 05/02/2019  . Alcoholism ([place])   . CAD (coronary artery disease)    a. 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 Chicken pox   . Depression   . ETOH abuse   . Genital warts   . Hepatic steatosis   . Hepatitis C    a. s/p [name] ~ 2003 in [place], [place] => stopped after 6 mos due to neutropenia - viral load noted to be low at that time;  no follow up since;   b. abdominal u/s 11/13:  diffuse hepatic steatosis and/or hepatocellular disease    . Hx of echocardiogram    a. Echo 01/06/12: Mild focal basal hypertrophy the septum, vigorous LV, EF 65-70%, Gr 2 diast dysfn, trivial AI, MAC, trivial MR, trivial TR, PASP 36  . Hyperlipidemia   . Hypertension   . Non-STEMI (non-ST elevated myocardial infarction) ([place])   . Stroke [facility])    "minor" pt reported  . Thrombocytopenia ([place])   . Tobacco abuse     Past Surgical History:  Procedure Laterality Date  . CORONARY ANGIOGRAPHY N/A 04/24/2019   Procedure: CORONARY  ANGIOGRAPHY;  Surgeon: Martinique, Peter M, MD;  Location: [facility];  Service: Cardiovascular;  Laterality: N/A;  . CORONARY/GRAFT ACUTE MI REVASCULARIZATION N/A 04/24/2019   Procedure: Coronary/Graft Acute MI Revascularization;  Surgeon: Martinique, Peter M, MD;  Location: [facility];  Service: Cardiovascular;  Laterality: N/A;  . EYE SURGERY  1954   corrective surgery for crossed eyes  . EYE SURGERY  1959  . LEFT HEART CATH AND CORONARY ANGIOGRAPHY N/A  04/24/2019   Procedure: LEFT HEART CATH AND CORONARY ANGIOGRAPHY;  Surgeon: Martinique, Peter M, MD;  Location: [facility];  Service: Cardiovascular;  Laterality: N/A;  . LEFT HEART CATHETERIZATION WITH CORONARY ANGIOGRAM N/A 01/05/2012   Procedure: LEFT HEART CATHETERIZATION WITH CORONARY ANGIOGRAM;  Surgeon: Larey Dresser, MD;  Location: [facility] C[facility] LAB;  Service: Cardiovascular;  Laterality: N/A;    Prior to Admission medications   Medication Sig Start Date End Date Taking? Authorizing Provider  albuterol (VENTOLIN HFA) 108 (90 Base) MCG/ACT inhaler Inhale 2 puffs into the lungs every 6 (six) hours as needed for wheezing or shortness of breath. 04/26/19  Yes Bhagat, Bhavinkumar, PA  aspirin EC 81 MG tablet Take 81 mg by mouth daily.   Yes [provider]  atorvastatin (LIPITOR) 80 MG tablet Take 1 tablet (80 mg total) by mouth daily at 6 PM. 04/26/19  Yes Bhagat, Bhavinkumar, PA  colchicine 0.6 MG tablet Take 1 tablet (0.6 mg total) by mouth daily. 04/27/19  Yes Bhagat, Bhavinkumar, PA  cyclobenzaprine (FLEXERIL) 10 MG tablet TAKE 1 TABLET BY MOUTH TWICE DAILY AS NEEDED FOR MUSCLE SPASM Patient taking differently: Take 10 mg by mouth daily as needed for muscle spasms.  01/12/19  Yes Copland, Gay Filler, MD  fluticasone (FLOVENT HFA) 110 MCG/ACT inhaler Inhale 2 puffs into the lungs 2 (two) times daily. 04/26/19 04/25/20 Yes Bhagat, Bhavinkumar, PA  metoprolol succinate (TOPROL-XL) 25 MG 24 hr tablet Take 1 tablet (25 mg total) by mouth daily. 04/26/19  Yes Bhagat, Bhavinkumar, PA  Multiple Vitamin (MULTIVITAMIN WITH MINERALS) TABS tablet Take 1 tablet by mouth daily.   Yes [provider]  nitroGLYCERIN (NITROSTAT) 0.4 MG SL tablet Place 1 tablet (0.4 mg total) under the tongue every 5 (five) minutes as needed for chest pain. 04/26/19  Yes Bhagat, Bhavinkumar, PA  ticagrelor (BRILINTA) 90 MG TABS tablet Take 1 tablet (90 mg total) by mouth 2 (two) times daily. 04/26/19  Yes Bhagat,  Crista Luria, PA    Current Facility-Administered Medications  Medication Dose Route Frequency Provider Last Rate Last Admin  . 0.9 %  sodium chloride infusion   Intravenous Continuous Phillips Grout, MD 75 mL/hr at 05/09/19 0518 New Bag at 05/09/19 0518  . albuterol (PROVENTIL) (2.5 MG/3ML) 0.083% nebulizer solution 3 mL  3 mL Inhalation Q6H PRN Phillips Grout, MD      . aspirin EC tablet 81 mg  81 mg Oral Daily Derrill Kay A, MD   81 mg at 05/09/19 0943  . atorvastatin (LIPITOR) tablet 80 mg  80 mg Oral q1800 Derrill Kay A, MD      . budesonide (PULMICORT) nebulizer solution 0.5 mg  0.5 mg Inhalation BID Derrill Kay A, MD   0.5 mg at 05/09/19 0843  . colchicine tablet 0.6 mg  0.6 mg Oral Daily Derrill Kay A, MD   0.6 mg at 05/09/19 0943  . cyclobenzaprine (FLEXERIL) tablet 10 mg  10 mg Oral Daily PRN Phillips Grout, MD      .  fentaNYL (SUBLIMAZE) injection 50 mcg  50 mcg Intravenous Q2H PRN Phillips Grout, MD   50 mcg at 05/09/19 0848  . MEDLINE mouth rinse  15 mL Mouth Rinse BID Phillips Grout, MD   15 mL at 05/09/19 0943  . metoprolol succinate (TOPROL-XL) 24 hr tablet 25 mg  25 mg Oral Daily Derrill Kay A, MD   25 mg at 05/09/19 0943  . morphine 4 MG/ML injection 4 mg  4 mg Intravenous Q4H PRN Phillips Grout, MD   4 mg at 05/09/19 6644  . multivitamin with minerals tablet 1 tablet  1 tablet Oral Daily Phillips Grout, MD   1 tablet at 05/09/19 0943  . nitroGLYCERIN (NITROSTAT) SL tablet 0.4 mg  0.4 mg Sublingual Q5 min PRN Phillips Grout, MD      . ondansetron [facility]) tablet 4 mg  4 mg Oral Q6H PRN Phillips Grout, MD       Or  . ondansetron [facility]) injection 4 mg  4 mg Intravenous Q6H PRN Derrill Kay A, MD      . oxyCODONE (Oxy IR/ROXICODONE) immediate release tablet 5 mg  5 mg Oral Q4H PRN Phillips Grout, MD   5 mg at 05/09/19 0515  . piperacillin-tazobactam (ZOSYN) IVPB 3.375 g  3.375 g Intravenous Q8H Wendee Beavers, RPH 12.5 mL/hr at 05/09/19 0950 3.375 g at  05/09/19 0950  . ticagrelor (BRILINTA) tablet 90 mg  90 mg Oral BID Ronnette Juniper, MD        Allergies as of 05/09/2019  . (No Known Allergies)    Family History  Problem Relation Age of Onset  . Heart attack Mother 68  . Diabetes Mother   . Heart disease Mother   . Hyperlipidemia Father   . Hypertension Father   . Diabetes Maternal Grandfather   . Heart disease Maternal Grandfather   . Early death Maternal Grandfather   . Heart attack Maternal Grandfather   . Hearing loss Sister   . Hyperlipidemia Sister   . Depression Brother   . Diabetes Brother   . Hyperlipidemia Brother   . Early death Maternal Grandmother   . Heart disease Maternal Grandmother   . Heart attack Maternal Grandmother   . Hearing loss Sister     Social History   Socioeconomic History  . Marital status: Single    Spouse name: Not on file  . Number of children: 1  . Years of education: Not on file  . Highest education level: Bachelor's degree (e.g., BA, AB, BS)  Occupational History  . Occupation: Music therapist: [employer]    Comment: retired  Tobacco Use  . Smoking status: Light Tobacco Smoker    Packs/day: 0.10    Types: Cigarettes  . Smokeless tobacco: Never Used  . Tobacco comment: almost quit, very rare, 1-2 daily  Substance and Sexual Activity  . Alcohol use: Not Currently    Alcohol/week: 0.0 standard drinks    Comment: Last drink: 10 days ago   . Drug use: Not Currently    Types: Cocaine    Comment: history of cocaine last use 2014  . Sexual activity: Yes    Partners: Female    Comment: pt. declined condoms  Other Topics Concern  . Not on file  Social History Narrative  . Not on file   Social Determinants of Health   Financial Resource Strain:   . Difficulty of Paying Living Expenses: Not on file  Food Insecurity:   .  Worried About Charity fundraiser in the Last Year: Not on file  . Ran Out of Food in the Last Year: Not on file  Transportation Needs:   .  Lack of Transportation (Medical): Not on file  . Lack of Transportation (Non-Medical): Not on file  Physical Activity:   . Days of Exercise per Week: Not on file  . Minutes of Exercise per Session: Not on file  Stress:   . Feeling of Stress : Not on file  Social Connections:   . Frequency of Communication with Friends and Family: Not on file  . Frequency of Social Gatherings with Friends and Family: Not on file  . Attends Religious Services: Not on file  . Active Member of Clubs or Organizations: Not on file  . Attends Archivist Meetings: Not on file  . Marital Status: Not on file  Intimate Partner Violence:   . Fear of Current or Ex-Partner: Not on file  . Emotionally Abused: Not on file  . Physically Abused: Not on file  . Sexually Abused: Not on file    Review of Systems: Positive for: GI: Described in detail in HPI.    Gen: Denies any fever, chills, rigors, night sweats, anorexia, fatigue, weakness, malaise, involuntary weight loss, and sleep disorder CV:  chest pain,Denies angina, palpitations, syncope, orthopnea, PND, peripheral edema, and claudication. Resp: Denies dyspnea, cough, sputum, wheezing, coughing up blood. GU : Denies urinary burning, blood in urine, urinary frequency, urinary hesitancy, nocturnal urination, and urinary incontinence. MS: Denies joint pain or swelling.  Denies muscle weakness, cramps, atrophy.  Derm: Denies rash, itching, oral ulcerations, hives, unhealing ulcers.  Psych: Denies depression, anxiety, memory loss, suicidal ideation, hallucinations,  and confusion. Heme: Denies bruising, bleeding, and enlarged lymph nodes. Neuro:  Denies any headaches, dizziness, paresthesias. Endo:  Denies any problems with DM, thyroid, adrenal function.  Physical Exam: Vital signs in last 24 hours: Temp:  [97.6 F (36.4 C)-98.8 F (37.1 C)] 98.8 F (37.1 C) (03/08 0452) Pulse Rate:  [92-106] 106 (03/08 0452) Resp:  [18-34] 18 (03/08 0452) BP:  (111-143)/(62-88) 126/73 (03/08 0452) SpO2:  [97 %-100 %] 99 % (03/08 0845) Weight:  [66.3 kg-68 kg] 66.3 kg (03/08 0452) Last BM Date: 05/08/19  General:   Alert,  Well-developed, well-nourished, pleasant and cooperative in NAD Head:  Normocephalic and atraumatic. Eyes:  Sclera clear, no icterus.   Conjunctiva pink. Ears:  Normal auditory acuity. Nose:  No deformity, discharge,  or lesions. Mouth:  No deformity or lesions.  Oropharynx pink & moist. Neck:  Supple; no masses or thyromegaly. Lungs:  Clear throughout to auscultation.   No wheezes, crackles, or rhonchi. No acute distress. Heart:  Regular rate and rhythm; no murmurs, clicks, rubs,  or gallops. Extremities:  Without clubbing or edema. Neurologic:  Alert and  oriented x4;  grossly normal neurologically. Skin:  Intact without significant lesions or rashes. Psych:  Alert and cooperative. Normal mood and affect. Abdomen: Right upper quadrant abdominal pain, with rebound and guarding and positive Murphy sign. Normoactive bowel sounds    Lab Results: Recent Labs    05/09/19 0122 05/09/19 0438  WBC 10.6* 11.5*  HGB 14.5 14.5  HCT 44.3 44.1  PLT 269 248   BMET Recent Labs    05/09/19 0122 05/09/19 0438  NA 134* 135  K 4.1 4.4  CL 99 102  CO2 21* 22  GLUCOSE 192* 123*  BUN 12 10  CREATININE 0.86 0.88  CALCIUM 9.3 9.2  LFT Recent Labs    05/09/19 0438  PROT 7.7  ALBUMIN 3.2*  AST 25  ALT 42  ALKPHOS 156*  BILITOT 1.7*   PT/INR No results for input(s): LABPROT, INR in the last 72 hours.  Studies/Results: US Abdomen Limited RUQ  Result Date: 05/09/2019 CLINICAL DATA:  Initial evaluation for acute right upper quadrant pain. EXAM: ULTRASOUND ABDOMEN LIMITED RIGHT UPPER QUADRANT COMPARISON:  Recent MRI from 05/02/2019. FINDINGS: Gallbladder: Gallbladder distended measuring up to 13.2 cm in length. Layering echogenic material within the gallbladder lumen consistent with sludge. Multiple scattered  superimposed tiny stones indicated by sonographer. Gallbladder wall mildly thickened up to 4 mm. No free pericholecystic fluid. Positive sonographic Murphy sign elicited on exam. Common bile duct: Diameter: 6.7 mm. At least 1 small stone measuring up to 3.6 mm present within the common bile duct. Probable adjacent sludge noted as well. Liver: No focal lesion identified. Within normal limits in parenchymal echogenicity. Portal vein is patent on color Doppler imaging with normal direction of blood flow towards the liver. Other: None. IMPRESSION: 1. Distended gallbladder with internal stones and sludge, with associated mild gallbladder wall thickening and positive sonographic Murphy sign. Clinical correlation for possible acute cholecystitis recommended. 2. At least 1 small stone and sludge within the common bile duct which measures up to 6.7 mm. Electronically Signed   By: Jeannine Boga M.D.   On: 05/09/2019 02:26    Impression: Choledocholithiasis, small CBD stone measuring 3.6 cm with a CBD diameter of 6.7 mm Cholelithiasis  Mild leukocytosis T bili/AST/ALT/ALP of 1.09/24/40/156 LFTs have trended down from 05/02/2019,?  Possibly passed CBD stone Normal lipase  History of recent STEMI in February 21 with stent placement, on Brilinta, last dose today  History of significant alcohol use, states he has been abstinent from alcohol use for the past 1.5 year Current smoker  Has been evaluated by surgery, due to recent MI and being on antiplatelet therapy, recommended ERCP   Plan: Discussed with patient's hospitalist Dr. Horris Latino.  Recommend cardiology evaluation and to hold Brilinta from today evening if permitted. If Brilinta is on hold from today, I may be able to plan an ERCP on Friday. Meanwhile I think it is safe to start patient on clear liquid diet. I do believe the patient will benefit from cholecystectomy at one point.   LOS: 0 days   Ronnette Juniper, MD  05/09/2019, 12:20 PM

## 2019-05-09 NOTE — H&P (Signed)
History and Physical    [name] UDJ:[id number] DOB: 1950-10-12 DOA: 05/09/2019  PCP: [name], MD  Patient coming from: home  Chief Complaint:  abd pain  HPI: Hunter Mcmillan is a 69 y.o. male with medical history significant of recent STEMI April 24, 2019 discharge and was doing well and then was readmitted on 04/30/2019 with symptomatic cholelithiasis.  Patient was admitted and medically treated with conservative management and he improved and was discharged on March 2 of 2021 with resolution of his abdominal pain nausea vomiting.  Patient comes back in today approximately 15 days out from his STEMI with right upper quadrant pain again with associated nausea vomiting.  Patient denies any fevers.  He reports the pain was extremely severe.  He denies any other sick contacts since his last discharge.  General surgery was called to evaluate the patient.  Patient's white count is normal.  Again he is afebrile vital signs are stable.  Pain has resolved some in the emergency department with fentanyl.  Patient is being referred for admission for again possible acute cholecystitis with symptomatic choledocholithiasis.  Review of Systems: As per HPI otherwise 10 point review of systems negative.   Past Medical History:  Diagnosis Date  . Abdominal pain 05/02/2019  . Alcoholism ([place])   . CAD (coronary artery disease)    a. 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 Chicken pox   . Depression   . ETOH abuse   . Genital warts   . Hepatic steatosis   . Hepatitis C    a. s/p [name] ~ 2003 in [place], [place] => stopped after 6 mos due to neutropenia - viral load noted to be low at  that time;  no follow up since;   b. abdominal u/s 11/13:  diffuse hepatic steatosis and/or hepatocellular disease    . Hx of echocardiogram    a. Echo 01/06/12: Mild focal basal hypertrophy the septum, vigorous LV, EF 65-70%, Gr 2 diast dysfn, trivial AI, MAC, trivial MR, trivial TR, PASP 36  . Hyperlipidemia   . Hypertension   . Non-STEMI (non-ST elevated myocardial infarction) ([place])   . Stroke [facility])    "minor" pt reported  . Thrombocytopenia ([place])   . Tobacco abuse     Past Surgical History:  Procedure Laterality Date  . CORONARY ANGIOGRAPHY N/A 04/24/2019   Procedure: CORONARY ANGIOGRAPHY;  Surgeon: Martinique, Peter M, MD;  Location: [place] [facility];  Service: Cardiovascular;  Laterality: N/A;  . CORONARY/GRAFT ACUTE MI REVASCULARIZATION N/A 04/24/2019   Procedure: Coronary/Graft Acute MI Revascularization;  Surgeon: Martinique, Peter M, MD;  Location: [place] [facility];  Service: Cardiovascular;  Laterality: N/A;  . EYE SURGERY  1954   corrective surgery for crossed eyes  . EYE SURGERY  1959  . LEFT HEART CATH AND CORONARY ANGIOGRAPHY N/A 04/24/2019   Procedure: LEFT HEART CATH AND CORONARY ANGIOGRAPHY;  Surgeon: Martinique, Peter M, MD;  Location: [place] [facility];  Service: Cardiovascular;  Laterality: N/A;  . LEFT HEART CATHETERIZATION WITH CORONARY ANGIOGRAM N/A 01/05/2012   Procedure: LEFT HEART CATHETERIZATION WITH CORONARY ANGIOGRAM;  Surgeon: Larey Dresser, MD;  Location: [facility]  CATH LAB;  Service: Cardiovascular;  Laterality: N/A;     reports that he has been smoking cigarettes. He has been smoking about 0.10 packs per day. He has never used smokeless tobacco. He reports previous alcohol use. He reports previous drug use. Drug: Cocaine.  No Known Allergies  Family History  Problem Relation Age of Onset  . Heart attack Mother 50  . Diabetes Mother   . Heart disease Mother   . Hyperlipidemia Father   . Hypertension Father   . Diabetes Maternal Grandfather   . Heart disease  Maternal Grandfather   . Early death Maternal Grandfather   . Heart attack Maternal Grandfather   . Hearing loss Sister   . Hyperlipidemia Sister   . Depression Brother   . Diabetes Brother   . Hyperlipidemia Brother   . Early death Maternal Grandmother   . Heart disease Maternal Grandmother   . Heart attack Maternal Grandmother   . Hearing loss Sister     Prior to Admission medications   Medication Sig Start Date End Date Taking? Authorizing Provider  albuterol (VENTOLIN HFA) 108 (90 Base) MCG/ACT inhaler Inhale 2 puffs into the lungs every 6 (six) hours as needed for wheezing or shortness of breath. 04/26/19  Yes Bhagat, Bhavinkumar, PA  aspirin EC 81 MG tablet Take 81 mg by mouth daily.   Yes [provider]  atorvastatin (LIPITOR) 80 MG tablet Take 1 tablet (80 mg total) by mouth daily at 6 PM. 04/26/19  Yes Bhagat, Bhavinkumar, PA  colchicine 0.6 MG tablet Take 1 tablet (0.6 mg total) by mouth daily. 04/27/19  Yes Bhagat, Bhavinkumar, PA  cyclobenzaprine (FLEXERIL) 10 MG tablet TAKE 1 TABLET BY MOUTH TWICE DAILY AS NEEDED FOR MUSCLE SPASM Patient taking differently: Take 10 mg by mouth daily as needed for muscle spasms.  01/12/19  Yes Copland, Gay Filler, MD  fluticasone (FLOVENT HFA) 110 MCG/ACT inhaler Inhale 2 puffs into the lungs 2 (two) times daily. 04/26/19 04/25/20 Yes Bhagat, Bhavinkumar, PA  metoprolol succinate (TOPROL-XL) 25 MG 24 hr tablet Take 1 tablet (25 mg total) by mouth daily. 04/26/19  Yes Bhagat, Bhavinkumar, PA  Multiple Vitamin (MULTIVITAMIN WITH MINERALS) TABS tablet Take 1 tablet by mouth daily.   Yes [provider]  nitroGLYCERIN (NITROSTAT) 0.4 MG SL tablet Place 1 tablet (0.4 mg total) under the tongue every 5 (five) minutes as needed for chest pain. 04/26/19  Yes Bhagat, Bhavinkumar, PA  ticagrelor (BRILINTA) 90 MG TABS tablet Take 1 tablet (90 mg total) by mouth 2 (two) times daily. 04/26/19  Yes Leanor Kail, PA    Physical  Exam: Vitals:   05/09/19 0145 05/09/19 0312 05/09/19 0315 05/09/19 0330  BP: (!) 143/78  132/77 111/62  Pulse: 97  97 (!) 105  Resp: (!) 34  20 (!) 26  Temp:      TempSrc:      SpO2: 100%  98% 97%  Weight:  68 kg    Height:  _0  (1.803 m)       Constitutional: NAD, calm, comfortable Vitals:   05/09/19 0145 05/09/19 0312 05/09/19 0315 05/09/19 0330  BP: (!) 143/78  132/77 111/62  Pulse: 97  97 (!) 105  Resp: (!) 34  20 (!) 26  Temp:      TempSrc:      SpO2: 100%  98% 97%  Weight:  68 kg    Height:  _1  (1.803 m)     Eyes: PERRL, lids and conjunctivae normal ENMT:  Mucous membranes are moist. Posterior pharynx clear of any exudate or lesions.Normal dentition.  Neck: normal, supple, no masses, no thyromegaly Respiratory: clear to auscultation bilaterally, no wheezing, no crackles. Normal respiratory effort. No accessory muscle use.  Cardiovascular: Regular rate and rhythm, no murmurs / rubs / gallops. No extremity edema. 2+ pedal pulses. No carotid bruits.  Abdomen: no tenderness, no masses palpated. No hepatosplenomegaly. Bowel sounds positive.  Musculoskeletal: no clubbing / cyanosis. No joint deformity upper and lower extremities. Good ROM, no contractures. Normal muscle tone.  Skin: no rashes, lesions, ulcers. No induration Neurologic: CN 2-12 grossly intact. Sensation intact, DTR normal. Strength 5/5 in all 4.  Psychiatric: Normal judgment and insight. Alert and oriented x 3. Normal mood.    Labs on Admission: I have personally reviewed following labs and imaging studies  CBC: Recent Labs  Lab 05/03/19 0352 05/09/19 0122  WBC 6.8 10.6*  NEUTROABS  --  8.8*  HGB 13.5 14.5  HCT 39.4 44.3  MCV 96.6 99.8  PLT 169 086   Basic Metabolic Panel: Recent Labs  Lab 05/03/19 0352 05/09/19 0122  NA 133* 134*  K 3.6 4.1  CL 103 99  CO2 22 21*  GLUCOSE 112* 192*  BUN 10 12  CREATININE 0.86 0.86  CALCIUM 8.8* 9.3   GFR: Estimated Creatinine Clearance: 78  mL/min (by C-G formula based on SCr of 0.86 mg/dL). Liver Function Tests: Recent Labs  Lab 05/03/19 0352 05/09/19 0122  AST 67* 34  ALT 167* 48*  ALKPHOS 261* 158*  BILITOT 2.6* 1.7*  PROT 6.5 7.6  ALBUMIN 2.8* 3.3*   Recent Labs  Lab 05/09/19 0122  LIPASE 18   No results for input(s): AMMONIA in the last 168 hours. Coagulation Profile: No results for input(s): INR, PROTIME in the last 168 hours. Cardiac Enzymes: No results for input(s): CKTOTAL, CKMB, CKMBINDEX, TROPONINI in the last 168 hours. BNP (last 3 results) No results for input(s): PROBNP in the last 8760 hours. HbA1C: No results for input(s): HGBA1C in the last 72 hours. CBG: No results for input(s): GLUCAP in the last 168 hours. Lipid Profile: No results for input(s): CHOL, HDL, LDLCALC, TRIG, CHOLHDL, LDLDIRECT in the last 72 hours. Thyroid Function Tests: No results for input(s): TSH, T4TOTAL, FREET4, T3FREE, THYROIDAB in the last 72 hours. Anemia Panel: No results for input(s): VITAMINB12, FOLATE, FERRITIN, TIBC, IRON, RETICCTPCT in the last 72 hours. Urine analysis:    Component Value Date/Time   COLORURINE AMBER (A) 05/09/2019 0122   APPEARANCEUR CLEAR 05/09/2019 0122   LABSPEC 1.023 05/09/2019 0122   PHURINE 6.0 05/09/2019 0122   GLUCOSEU 50 (A) 05/09/2019 0122   HGBUR NEGATIVE 05/09/2019 0122   BILIRUBINUR NEGATIVE 05/09/2019 0122   KETONESUR NEGATIVE 05/09/2019 0122   PROTEINUR 100 (A) 05/09/2019 0122   UROBILINOGEN 1.0 01/05/2012 1400   NITRITE NEGATIVE 05/09/2019 0122   LEUKOCYTESUR NEGATIVE 05/09/2019 0122   Sepsis Labs: !!!!!!!!!!!!!!!!!!!!!!!!!!!!!!!!!!!!!!!!!!!! _0 (procalcitonin:4,lacticidven:4) ) Recent Results (from the past 240 hour(s))  SARS CORONAVIRUS 2 (TAT 6-24 HRS) Nasopharyngeal Nasopharyngeal Swab     Status: None   Collection Time: 04/30/19  1:39 PM   Specimen: Nasopharyngeal Swab  Result Value Ref Range Status   SARS Coronavirus 2 NEGATIVE NEGATIVE Final     Comment: (NOTE) SARS-CoV-2 target nucleic acids are NOT DETECTED. The SARS-CoV-2 RNA is generally detectable in upper and lower respiratory specimens during the acute phase of infection. Negative results do not preclude SARS-CoV-2 infection, do not rule out co-infections with other pathogens, and should  not be used as the sole basis for treatment or other patient management decisions. Negative results must be combined with clinical observations, patient history, and epidemiological information. The expected result is Negative. Fact Sheet for Patients: SugarRoll.be Fact Sheet for Healthcare Providers: https://www.woods-mathews.com/ This test is not yet approved or cleared by the [place] FDA and  has been authorized for detection and/or diagnosis of SARS-CoV-2 by FDA under an Emergency Use Authorization (EUA). This EUA will remain  in effect (meaning this test can be used) for the duration of the COVID-19 declaration under Section 56 4(b)(1) of the Act, 21 U.S.C. section 360bbb-3(b)(1), unless the authorization is terminated or revoked sooner. Performed at [facility], [place] [address], [place], [place] [address]      Radiological Exams on Admission: US Abdomen Limited RUQ  Result Date: 05/09/2019 CLINICAL DATA:  Initial evaluation for acute right upper quadrant pain. EXAM: ULTRASOUND ABDOMEN LIMITED RIGHT UPPER QUADRANT COMPARISON:  Recent MRI from 05/02/2019. FINDINGS: Gallbladder: Gallbladder distended measuring up to 13.2 cm in length. Layering echogenic material within the gallbladder lumen consistent with sludge. Multiple scattered superimposed tiny stones indicated by sonographer. Gallbladder wall mildly thickened up to 4 mm. No free pericholecystic fluid. Positive sonographic Murphy sign elicited on exam. Common bile duct: Diameter: 6.7 mm. At least 1 small stone measuring up to 3.6 mm present within the common bile duct. Probable  adjacent sludge noted as well. Liver: No focal lesion identified. Within normal limits in parenchymal echogenicity. Portal vein is patent on color Doppler imaging with normal direction of blood flow towards the liver. Other: None. IMPRESSION: 1. Distended gallbladder with internal stones and sludge, with associated mild gallbladder wall thickening and positive sonographic Murphy sign. Clinical correlation for possible acute cholecystitis recommended. 2. At least 1 small stone and sludge within the common bile duct which measures up to 6.7 mm. Electronically Signed   By: Jeannine Boga M.D.   On: 05/09/2019 02:26   Old chart reviewed Case discussed with edp  Assessment/Plan 69 yo male with recent stemi comes in second time in last week for symptomatic cholelithiasis  Principal Problem:   Cholelithiasis with possible acute cholecystitis-keep patient n.p.o. at this time.  White count is less than 11.  Patient afebrile.  Abdominal exam has some tenderness but is overall benign.  Not distended.  Not vomiting actively in the ED right now.  Ideally we assume he would want a wait at least 30 days before any surgery takes place on this patient.  May need drainage with IR if his symptoms do not improve again with conservative management.  I have discussed with the patient the situation with his recent STEMI and the rationale for waiting and delaying surgery during this critical time after his PCI.  He states his understanding.  Will likely also need to call GI and cardiology if there is any procedures that will need to be done after following up on surgery recommendations.  No need for any acute interventions tonight.  Active Problems:   Hx of non-ST elevation myocardial infarction (NSTEMI)-continue his antiplatelet treatment.    Cocaine abuse ([facility])-reports no use in 2 weeks.  Check urine drug screen    COPD (chronic obstructive pulmonary disease) ([facility])-stable    Essential hypertension-continue home  meds    Acute cholecystitis-as above     DVT prophylaxis: scds Code Status: full Family Communication: none Disposition Plan: days Consults called:  gen surg Admission status:  admission   Marik Sedore A MD [facility]  If 7PM-7AM, please contact night-coverage [email or web address] Password [facility]  05/09/2019, 4:04 AM

## 2019-05-09 NOTE — Plan of Care (Signed)
  Problem: Clinical Measurements: Goal: Ability to maintain clinical measurements within normal limits will improve Outcome: Progressing Goal: Will remain free from infection Outcome: Progressing Goal: Diagnostic test results will improve Outcome: Progressing Goal: Cardiovascular complication will be avoided Outcome: Progressing   

## 2019-05-09 NOTE — ED Triage Notes (Signed)
Pt arrived from home via [facility] c/o 8/10 ABD pain for 2 days worseing today. Per pt he has had similar pain previously which was diagnosed as gallstones. Denies N/V/D. Pt had 2 cardiac stents on Feb 21st and stated he is on several blood thinners.

## 2019-05-09 NOTE — Progress Notes (Signed)
Pharmacy Antibiotic Note  Hunter Mcmillan is a 69 y.o. male admitted on 05/09/2019 with abdominal pain. He is s/p recent STEMI on 04/24/19.  Pharmacy has been consulted for Zosyn dosing for intra-abdominal infection.  He received Zosyn 3.375 g IV this AM ~ 0400.   Plan: Zosyn 3.375 g IV q8 hours,  Infuse each dose over 4 hours Monitor clinical status, renal function and culture results daily.    Height: 5\' 11"  (180.3 cm) Weight: 146 lb 2.6 oz (66.3 kg) IBW/kg (Calculated) : 75.3  Temp (24hrs), Avg:98.2 F (36.8 C), Min:97.6 F (36.4 C), Max:98.8 F (37.1 C)  Recent Labs  Lab 05/03/19 0352 05/09/19 0122 05/09/19 0438  WBC 6.8 10.6* 11.5*  CREATININE 0.86 0.86 0.88    Estimated Creatinine Clearance: 74.3 mL/min (by C-G formula based on SCr of 0.88 mg/dL).    No Known Allergies  Antimicrobials this admission: Zosyn 3/8>>  Dose adjustments this admission:  Microbiology results: 3/8 COVID: negative  Thank you for allowing pharmacy to be a part of this patient's care.   5/8, RPh Clinical Pharmacist 05/09/2019 9:05 AM

## 2019-05-09 NOTE — Progress Notes (Addendum)
PROGRESS NOTE  Hunter Mcmillan PNT:[id number] DOB: 11/21/1950 DOA: 05/09/2019 PCP: Hunter Mclean, MD  HPI/Recap of past 24 hours: HPI from Dr [name] is a 69 y.o. male with medical history significant of recent STEMI April 24, 2019 discharge and was doing well and then was readmitted on 04/30/2019 with symptomatic cholelithiasis.  Patient was admitted and medically treated with conservative management and he improved and was discharged on March 2 of 2021 with resolution of his abdominal pain. Patient comes back again, approximately 15 days out from his STEMI with right upper quadrant pain again with associated nausea vomiting. General surgery was called to evaluate the patient. Patient is being referred for admission for again possible acute cholecystitis with symptomatic choledocholithiasis.    Today, patient still with significant abdominal pain, especially on any kind of movement/ambulation.  Patient reports resolving nausea/vomiting.  Denies any chest pain, shortness of breath, fever/chills.    Assessment/Plan: Principal Problem:   Cholelithiasis Active Problems:   Hx of non-ST elevation myocardial infarction (NSTEMI)   Cocaine abuse (HCC)   COPD (chronic obstructive pulmonary disease) (HCC)   Essential hypertension   Acute cholecystitis  ??Sepsis likely 2/2 ??acute cholecystitis/Choledocholithiasis Currently febrile 101.1, with leukocytosis BC X 2 pending CXR pending UA unremarkable LFTs have trended down, T bili now 1.7 RUQ ultrasound showed possible acute cholecystitis, with one small stone measuring up to 3.6 mm present in the CBD measuring up to 6.7 mm General surgery consulted, recommend ERCP GI consulted, due to recent STEMI and on Brilinta, will plan for ERCP on 10/13/2019 Cardiology consulted, due to recurrence of acute cholecystitis, recommend bridging with Aggrastat and holding Brilinta until ERCP is done (pharmacy consulted by  cardiology) Continue IV Zosyn, clear liquid diet, IVF Pain management  CAD with recent STEMI On 2/21 had inferior STEMI with PCI/DES x2 Currently chest pain-free Cardiology on board, okay to hold Brilinta and bridge with Aggrastat Continue aspirin, metoprolol Will place patient on telemetry  Hyperlipidemia Continue Lipitor  COPD Continue nebs  Tobacco/polysubstance abuse Advised to quit        Malnutrition Type:      Malnutrition Characteristics:      Nutrition Interventions:       Estimated body mass index is 20.39 kg/m as calculated from the following:   Height as of this encounter: 5\' 11"  (1.803 m).   Weight as of this encounter: 66.3 kg.     Code Status: Full  Family Communication: Plan to update family once able  Disposition Plan: Patient will need ERCP, which will be done on Friday.  Likely discharge after ERCP once stable.  Likely home   Consultants:  GI  Cardiology  General surgery  Procedures:  None  Antimicrobials:  Zosyn  DVT prophylaxis: SCDs for now due to upcoming ERCP   Objective: Vitals:   05/09/19 0315 05/09/19 0330 05/09/19 0452 05/09/19 0845  BP: 132/77 111/62 126/73   Pulse: 97 (!) 105 (!) 106   Resp: 20 (!) 26 18   Temp:   98.8 F (37.1 C)   TempSrc:   Oral   SpO2: 98% 97% 99% 99%  Weight:   66.3 kg   Height:   5\' 11"  (1.803 m)     Intake/Output Summary (Last 24 hours) at 05/09/2019 1433 Last data filed at 05/09/2019 1117 Gross per 24 hour  Intake 0 ml  Output 75 ml  Net -75 ml   Filed Weights   05/09/19 0312 05/09/19 0452  Weight: 68 kg 66.3 kg  Exam:  General: NAD   Cardiovascular: S1, S2 present  Respiratory: CTAB  Abdomen: Soft, ++tender RUQ, nondistended, bowel sounds present  Musculoskeletal: No bilateral pedal edema noted  Skin: Normal  Psychiatry: Normal mood   Data Reviewed: CBC: Recent Labs  Lab 05/03/19 0352 05/09/19 0122 05/09/19 0438  WBC 6.8 10.6* 11.5*   NEUTROABS  --  8.8*  --   HGB 13.5 14.5 14.5  HCT 39.4 44.3 44.1  MCV 96.6 99.8 99.1  PLT 169 269 248   Basic Metabolic Panel: Recent Labs  Lab 05/03/19 0352 05/09/19 0122 05/09/19 0438  NA 133* 134* 135  K 3.6 4.1 4.4  CL 103 99 102  CO2 22 21* 22  GLUCOSE 112* 192* 123*  BUN 10 12 10   CREATININE 0.86 0.86 0.88  CALCIUM 8.8* 9.3 9.2   GFR: Estimated Creatinine Clearance: 74.3 mL/min (by C-G formula based on SCr of 0.88 mg/dL). Liver Function Tests: Recent Labs  Lab 05/03/19 0352 05/09/19 0122 05/09/19 0438  AST 67* 34 25  ALT 167* 48* 42  ALKPHOS 261* 158* 156*  BILITOT 2.6* 1.7* 1.7*  PROT 6.5 7.6 7.7  ALBUMIN 2.8* 3.3* 3.2*   Recent Labs  Lab 05/09/19 0122  LIPASE 18   No results for input(s): AMMONIA in the last 168 hours. Coagulation Profile: No results for input(s): INR, PROTIME in the last 168 hours. Cardiac Enzymes: No results for input(s): CKTOTAL, CKMB, CKMBINDEX, TROPONINI in the last 168 hours. BNP (last 3 results) No results for input(s): PROBNP in the last 8760 hours. HbA1C: No results for input(s): HGBA1C in the last 72 hours. CBG: No results for input(s): GLUCAP in the last 168 hours. Lipid Profile: No results for input(s): CHOL, HDL, LDLCALC, TRIG, CHOLHDL, LDLDIRECT in the last 72 hours. Thyroid Function Tests: No results for input(s): TSH, T4TOTAL, FREET4, T3FREE, THYROIDAB in the last 72 hours. Anemia Panel: No results for input(s): VITAMINB12, FOLATE, FERRITIN, TIBC, IRON, RETICCTPCT in the last 72 hours. Urine analysis:    Component Value Date/Time   COLORURINE AMBER (A) 05/09/2019 0122   APPEARANCEUR CLEAR 05/09/2019 0122   LABSPEC 1.023 05/09/2019 0122   PHURINE 6.0 05/09/2019 0122   GLUCOSEU 50 (A) 05/09/2019 0122   HGBUR NEGATIVE 05/09/2019 0122   BILIRUBINUR NEGATIVE 05/09/2019 0122   KETONESUR NEGATIVE 05/09/2019 0122   PROTEINUR 100 (A) 05/09/2019 0122   UROBILINOGEN 1.0 01/05/2012 1400   NITRITE NEGATIVE 05/09/2019  0122   LEUKOCYTESUR NEGATIVE 05/09/2019 0122   Sepsis Labs: @LABRCNTIP (procalcitonin:4,lacticidven:4)  ) Recent Results (from the past 240 hour(s))  SARS CORONAVIRUS 2 (TAT 6-24 HRS) Nasopharyngeal Nasopharyngeal Swab     Status: None   Collection Time: 04/30/19  1:39 PM   Specimen: Nasopharyngeal Swab  Result Value Ref Range Status   SARS Coronavirus 2 NEGATIVE NEGATIVE Final    Comment: (NOTE) SARS-CoV-2 target nucleic acids are NOT DETECTED. The SARS-CoV-2 RNA is generally detectable in upper and lower respiratory specimens during the acute phase of infection. Negative results do not preclude SARS-CoV-2 infection, do not rule out co-infections with other pathogens, and should not be used as the sole basis for treatment or other patient management decisions. Negative results must be combined with clinical observations, patient history, and epidemiological information. The expected result is Negative. Fact Sheet for Patients: Fact Sheet for Healthcare Providers: 05/02/19 This test is not yet approved or cleared by the [place] FDA and  has been authorized for detection and/or diagnosis of SARS-CoV-2 by FDA under an Emergency Use  Authorization (EUA). This EUA will remain  in effect (meaning this test can be used) for the duration of the COVID-19 declaration under Section 56 4(b)(1) of the Act, 21 U.S.C. section 360bbb-3(b)(1), unless the authorization is terminated or revoked sooner. Performed at [facility], [address], [place], [place] [address]   Respiratory Panel by RT PCR (Flu A&B, Covid) - Nasopharyngeal Swab     Status: None   Collection Time: 05/09/19  3:02 AM   Specimen: Nasopharyngeal Swab  Result Value Ref Range Status   SARS Coronavirus 2 by RT PCR NEGATIVE NEGATIVE Final    Comment: (NOTE) SARS-CoV-2 target nucleic acids are NOT DETECTED. The SARS-CoV-2 RNA is generally  detectable in upper respiratoy specimens during the acute phase of infection. The lowest concentration of SARS-CoV-2 viral copies this assay can detect is 131 copies/mL. A negative result does not preclude SARS-Cov-2 infection and should not be used as the sole basis for treatment or other patient management decisions. A negative result may occur with  improper specimen collection/handling, submission of specimen other than nasopharyngeal swab, presence of viral mutation(s) within the areas targeted by this assay, and inadequate number of viral copies (<131 copies/mL). A negative result must be combined with clinical observations, patient history, and epidemiological information. The expected result is Negative. Fact Sheet for Patients:  https://www.moore.com/ Fact Sheet for Healthcare Providers:  https://www.young.biz/ This test is not yet ap proved or cleared by the [place] FDA and  has been authorized for detection and/or diagnosis of SARS-CoV-2 by FDA under an Emergency Use Authorization (EUA). This EUA will remain  in effect (meaning this test can be used) for the duration of the COVID-19 declaration under Section 564(b)(1) of the Act, 21 U.S.C. section 360bbb-3(b)(1), unless the authorization is terminated or revoked sooner.    Influenza A by PCR NEGATIVE NEGATIVE Final   Influenza B by PCR NEGATIVE NEGATIVE Final    Comment: (NOTE) The Xpert Xpress SARS-CoV-2/FLU/RSV assay is intended as an aid in  the diagnosis of influenza from Nasopharyngeal swab specimens and  should not be used as a sole basis for treatment. Nasal washings and  aspirates are unacceptable for Xpert Xpress SARS-CoV-2/FLU/RSV  testing. Fact Sheet for Patients: https://www.moore.com/ Fact Sheet for Healthcare Providers: https://www.young.biz/ This test is not yet approved or cleared by the [place] FDA and  has been  authorized for detection and/or diagnosis of SARS-CoV-2 by  FDA under an Emergency Use Authorization (EUA). This EUA will remain  in effect (meaning this test can be used) for the duration of the  Covid-19 declaration under Section 564(b)(1) of the Act, 21  U.S.C. section 360bbb-3(b)(1), unless the authorization is  terminated or revoked. Performed at [facility], [address], [place], [place] [address]       Studies: US Abdomen Limited RUQ  Result Date: 05/09/2019 CLINICAL DATA:  Initial evaluation for acute right upper quadrant pain. EXAM: ULTRASOUND ABDOMEN LIMITED RIGHT UPPER QUADRANT COMPARISON:  Recent MRI from 05/02/2019. FINDINGS: Gallbladder: Gallbladder distended measuring up to 13.2 cm in length. Layering echogenic material within the gallbladder lumen consistent with sludge. Multiple scattered superimposed tiny stones indicated by sonographer. Gallbladder wall mildly thickened up to 4 mm. No free pericholecystic fluid. Positive sonographic Murphy sign elicited on exam. Common bile duct: Diameter: 6.7 mm. At least 1 small stone measuring up to 3.6 mm present within the common bile duct. Probable adjacent sludge noted as well. Liver: No focal lesion identified. Within normal limits in  parenchymal echogenicity. Portal vein is patent on color Doppler imaging with normal direction of blood flow towards the liver. Other: None. IMPRESSION: 1. Distended gallbladder with internal stones and sludge, with associated mild gallbladder wall thickening and positive sonographic Murphy sign. Clinical correlation for possible acute cholecystitis recommended. 2. At least 1 small stone and sludge within the common bile duct which measures up to 6.7 mm. Electronically Signed   By: Rise Mu M.D.   On: 05/09/2019 02:26    Scheduled Meds: . aspirin EC  81 mg Oral Daily  . atorvastatin  80 mg Oral q1800  . budesonide  0.5 mg Inhalation BID  . colchicine  0.6 mg Oral Daily  . mouth  rinse  15 mL Mouth Rinse BID  . metoprolol succinate  25 mg Oral Daily  . multivitamin with minerals  1 tablet Oral Daily    Continuous Infusions: . sodium chloride 75 mL/hr at 05/09/19 0518  . piperacillin-tazobactam (ZOSYN)  IV 3.375 g (05/09/19 1359)     LOS: 0 days     Briant Cedar, MD [facility]  If 7PM-7AM, please contact night-coverage [email or web address] 05/09/2019, 2:33 PM

## 2019-05-09 NOTE — Progress Notes (Signed)
   05/09/19 1446  MEWS Score  Temp (!) 101.1 F (38.4 C)  BP (!) 92/51  Pulse Rate 86  Resp 20  SpO2 97 %  O2 Device Room Air  MEWS Score  MEWS Temp 1  MEWS Systolic 1  MEWS Pulse 0  MEWS RR 0  MEWS LOC 0  MEWS Score 2  MEWS Score Color Yellow  MEWS Assessment  Is this an acute change? Yes  MEWS guidelines implemented *See Row Information* Yellow  Provider Notification  Provider Name/Title Andreas Newport, MD  Date Provider Notified 05/09/19  Time Provider Notified 1453  Notification Type Page  Notification Reason Change in status;Other (Comment) (fever, decreased BP)  Response See new orders  Date of Provider Response 05/09/19  Time of Provider Response 1454

## 2019-05-09 NOTE — Progress Notes (Signed)
Pt fever 101.1 BP 92/51. Pt asymptomatic. Andreas Newport, MD paged. Awaiting MD response. Will continue to monitor.

## 2019-05-10 ENCOUNTER — Ambulatory Visit: Payer: Medicare Other | Admitting: *Deleted

## 2019-05-10 DIAGNOSIS — F1410 Cocaine abuse, uncomplicated: Secondary | ICD-10-CM

## 2019-05-10 DIAGNOSIS — I498 Other specified cardiac arrhythmias: Secondary | ICD-10-CM

## 2019-05-10 LAB — COMPREHENSIVE METABOLIC PANEL
ALT: 52 U/L — ABNORMAL HIGH (ref 0–44)
AST: 62 U/L — ABNORMAL HIGH (ref 15–41)
Albumin: 2.3 g/dL — ABNORMAL LOW (ref 3.5–5.0)
Alkaline Phosphatase: 227 U/L — ABNORMAL HIGH (ref 38–126)
Anion gap: 10 (ref 5–15)
BUN: 17 mg/dL (ref 8–23)
CO2: 24 mmol/L (ref 22–32)
Calcium: 8.3 mg/dL — ABNORMAL LOW (ref 8.9–10.3)
Chloride: 99 mmol/L (ref 98–111)
Creatinine, Ser: 1.32 mg/dL — ABNORMAL HIGH (ref 0.61–1.24)
GFR calc Af Amer: >60 mL/min (ref >60)
GFR calc non Af Amer: 55 mL/min — ABNORMAL LOW (ref >60)
Glucose, Bld: 105 mg/dL — ABNORMAL HIGH (ref 70–99)
Potassium: 4.8 mmol/L (ref 3.5–5.1)
Sodium: 133 mmol/L — ABNORMAL LOW (ref 135–145)
Total Bilirubin: 4.2 mg/dL — ABNORMAL HIGH (ref 0.3–1.2)
Total Protein: 5.8 g/dL — ABNORMAL LOW (ref 6.5–8.1)

## 2019-05-10 MED ORDER — VANCOMYCIN HCL 1500 MG/300ML IV SOLN
1500.0000 mg | Freq: Once | INTRAVENOUS | Status: AC
  Administered 2019-05-10: 1500 mg via INTRAVENOUS
  Filled 2019-05-10: qty 300

## 2019-05-10 MED ORDER — TIROFIBAN HCL IN NACL 5-0.9 MG/100ML-% IV SOLN
0.1500 ug/kg/min | INTRAVENOUS | Status: DC
  Administered 2019-05-11 (×2): 0.075 ug/kg/min via INTRAVENOUS
  Administered 2019-05-11 – 2019-05-12 (×2): 0.15 ug/kg/min via INTRAVENOUS
  Filled 2019-05-10 (×3): qty 100

## 2019-05-10 MED ORDER — CARVEDILOL 3.125 MG PO TABS
3.1250 mg | ORAL_TABLET | Freq: Two times a day (BID) | ORAL | Status: DC
  Administered 2019-05-10 – 2019-05-13 (×6): 3.125 mg via ORAL
  Filled 2019-05-10 (×6): qty 1

## 2019-05-10 MED ORDER — VANCOMYCIN HCL 1250 MG/250ML IV SOLN
1250.0000 mg | INTRAVENOUS | Status: DC
  Administered 2019-05-11: 1250 mg via INTRAVENOUS
  Filled 2019-05-10: qty 250

## 2019-05-10 MED ORDER — SODIUM CHLORIDE 0.9 % IV BOLUS: 500.0000 mL | Freq: Once | INTRAVENOUS | Status: DC

## 2019-05-10 MED ORDER — ATORVASTATIN CALCIUM 10 MG PO TABS
20.0000 mg | ORAL_TABLET | Freq: Every day | ORAL | Status: DC
  Administered 2019-05-10 – 2019-05-12 (×3): 20 mg via ORAL
  Filled 2019-05-10 (×3): qty 2

## 2019-05-10 NOTE — Progress Notes (Signed)
Pharmacy Antibiotic Note  Hunter Mcmillan is a 69 y.o. male admitted on 05/09/2019 with abdominal pain. He is s/p recent STEMI on 04/24/19.  Pharmacy consulted 3/8 for Zosyn dosing for intra-abdominal infection.  Today 3/9 pharmacy consulted to add Vancomycin for sepsis   Plan: Vancomycin 1500 mg IV x 1 then 1250 mg q24h , est AUC 480, used SCr 1.32, Vd 0.72 L/kg Continue  Zosyn 3.375 g IV q8 hours,  Infuse each dose over 4 hours Monitor clinical status, renal function , culture results daily.  Vanc pk and trough per protocol   Height: 5\' 11"  (180.3 cm) Weight: 155 lb 13.8 oz (70.7 kg) IBW/kg (Calculated) : 75.3  Temp (24hrs), Avg:100 F (37.8 C), Min:98.1 F (36.7 C), Max:101.5 F (38.6 C)  Recent Labs  Lab 05/09/19 0122 05/09/19 0438 05/10/19 0225  WBC 10.6* 11.5* 9.2  CREATININE 0.86 0.88 1.32*    Estimated Creatinine Clearance: 52.8 mL/min (A) (by C-G formula based on SCr of 1.32 mg/dL (H)).    No Known Allergies  Antimicrobials this admission: Zosyn 3/8>> Vancomycin 3/9>>  Dose adjustments this admission:  Microbiology results: 3/8 COVID: negative 3/8 Blood Cx: sent  Thank you for allowing pharmacy to be a part of this patient's care.  5/8, RPh Clinical Pharmacist  [phone]  Please check AMION for all [facility] Pharm[facility] phone numbers After 10:00 PM, call Main Pharmacy [phone] 05/10/2019 8:36 AM

## 2019-05-10 NOTE — Progress Notes (Addendum)
Progress Note  Patient Name: Hunter Mcmillan Date of Encounter: 05/10/2019  Primary Cardiologist: Dr. Martinique  Subjective   Last evening in the setting of right upper quadrant pain after administration of morphine his pain became more intense and he noted some mild chest discomfort.  This ultimately resolved with fentanyl.  He has been able to walk in the hallways without recurrent chest pain symptomatology.  Inpatient Medications    Scheduled Meds: . aspirin EC  81 mg Oral Daily  . atorvastatin  80 mg Oral q1800  . budesonide  0.5 mg Inhalation BID  . colchicine  0.6 mg Oral Daily  . mouth rinse  15 mL Mouth Rinse BID  . multivitamin with minerals  1 tablet Oral Daily   Continuous Infusions: . sodium chloride 100 mL/hr at 05/10/19 0300  . piperacillin-tazobactam (ZOSYN)  IV 3.375 g (05/10/19 0510)  . sodium chloride    . tirofiban 0.15 mcg/kg/min (05/10/19 0508)  . [START ON 05/11/2019] vancomycin    . vancomycin 1,500 mg (05/10/19 1018)   PRN Meds: acetaminophen, albuterol, cyclobenzaprine, fentaNYL (SUBLIMAZE) injection, morphine injection, nitroGLYCERIN, ondansetron **OR** ondansetron (ZOFRAN) IV, oxyCODONE   Vital Signs    Vitals:   05/10/19 0656 05/10/19 0833 05/10/19 0938 05/10/19 1040  BP: (!) 84/56  99/60 (!) 101/58  Pulse: 86  92 92  Resp: 20   18  Temp: 98.1 F (36.7 C)   98.4 F (36.9 C)  TempSrc: Oral   Oral  SpO2: 97% 98%  100%  Weight:      Height:        Intake/Output Summary (Last 24 hours) at 05/10/2019 1134 Last data filed at 05/10/2019 0900 Gross per 24 hour  Intake 4234.62 ml  Output 375 ml  Net 3859.62 ml    I/O since admission:   Filed Weights   05/09/19 0312 05/09/19 0452 05/10/19 0500  Weight: 68 kg 66.3 kg 70.7 kg    Telemetry    Sinus; had transient trigeminy on monitoring- Personally Reviewed  ECG    ECG (independently read by me): We will repeat today  05/09/2019 ECG (independently read by me): Normal sinus rhythm at 98  bpm.  Q waves 2 3 and F V5 and V6 with T wave inversion system with recent inferolateral myocardial infarction  Physical Exam   BP (!) 101/58 (BP Location: Right Arm)   Pulse 92   Temp 98.4 F (36.9 C) (Oral)   Resp 18   Ht 5\' 11"  (1.803 m)   Wt 70.7 kg   SpO2 100%   BMI 21.74 kg/m  General: Alert, oriented, no distress.  Skin: normal turgor, no rashes, warm and dry; bearded HEENT: Normocephalic, atraumatic. Pupils equal round and reactive to light; sclera anicteric; extraocular muscles intact;  Nose without nasal septal hypertrophy Mouth/Parynx benign; Mallinpatti scale 3 Neck: No JVD, no carotid bruits; normal carotid upstroke Lungs: clear to ausculatation and percussion; no wheezing or rales Chest wall: without tenderness to palpitation Heart: PMI not displaced, RRR, s1 s2 normal, 1/6 systolic murmur, no diastolic murmur, no rubs, gallops, thrills, or heaves Abdomen: Right upper quadrant tenderness to palpation, negative rebound.  Bowel sounds positive ; abdominal aorta nontender and not dilated by palpation. Back: no CVA tenderness Pulses 2+ Musculoskeletal: full range of motion, normal strength, no joint deformities Extremities: no clubbing cyanosis or edema, Homan's sign negative  Neurologic: grossly nonfocal; Cranial nerves grossly wnl Psychologic: Normal mood and affect   Labs    Chemistry Recent Labs  Lab  05/09/19 0122 05/09/19 0438 05/10/19 0225  NA 134* 135 133*  K 4.1 4.4 4.8  CL 99 102 99  CO2 21* 22 24  GLUCOSE 192* 123* 105*  BUN 12 10 17   CREATININE 0.86 0.88 1.32*  CALCIUM 9.3 9.2 8.3*  PROT 7.6 7.7 5.8*  ALBUMIN 3.3* 3.2* 2.3*  AST 34 25 62*  ALT 48* 42 52*  ALKPHOS 158* 156* 227*  BILITOT 1.7* 1.7* 4.2*  GFRNONAA >60 >60 55*  GFRAA >60 >60 >60  ANIONGAP 14 11 10      Hematology Recent Labs  Lab 05/09/19 0122 05/09/19 0438 05/10/19 0225  WBC 10.6* 11.5* 9.2  RBC 4.44 4.45 3.51*  HGB 14.5 14.5 11.5*  HCT 44.3 44.1 34.0*  MCV 99.8  99.1 96.9  MCH 32.7 32.6 32.8  MCHC 32.7 32.9 33.8  RDW 14.0 14.0 14.0  PLT 269 248 197    Cardiac EnzymesNo results for input(s): TROPONINI in the last 168 hours. No results for input(s): TROPIPOC in the last 168 hours.   BNPNo results for input(s): BNP, PROBNP in the last 168 hours.   DDimer No results for input(s): DDIMER in the last 168 hours.   Lipid Panel     Component Value Date/Time   CHOL 94 04/26/2019 0314   TRIG 63 04/26/2019 0314   HDL 16 (L) 04/26/2019 0314   CHOLHDL 5.9 04/26/2019 0314   VLDL 13 04/26/2019 0314   LDLCALC 65 04/26/2019 0314     Radiology    DG Chest Port 1 View  Result Date: 05/09/2019 CLINICAL DATA:  Recent STEMI.  Symptomatic cholelithiasis. EXAM: PORTABLE CHEST 1 VIEW COMPARISON:  April 24, 2019 FINDINGS: There is stable borderline cardiomegaly. Inspiration is shallow and central pulmonary vascular congestion more pronounced. Mild bilateral infrahilar bronchial wall thickening has developed. Interstitial opacities in the mid lung zones may be atelectatic or inflammatory. There is no obvious pleural effusion or overt pulmonary edema or pneumothorax. Moderate skeletal degenerative change, aortic knob calcified atherosclerosis and telemetry leads are present. There is no free subdiaphragmatic air. IMPRESSION: Stable borderline cardiomegaly with mild bilateral infrahilar bronchitis and interstitial opacities in both mid lung zones that may be atelectatic or inflammatory. No pulmonary edema or obvious pleural effusion or free subdiaphragmatic air. Electronically Signed   By: 07/09/2019   On: 05/09/2019 16:02   Laurence Ferrari Abdomen Limited RUQ  Result Date: 05/09/2019 CLINICAL DATA:  Initial evaluation for acute right upper quadrant pain. EXAM: ULTRASOUND ABDOMEN LIMITED RIGHT UPPER QUADRANT COMPARISON:  Recent MRI from 05/02/2019. FINDINGS: Gallbladder: Gallbladder distended measuring up to 13.2 cm in length. Layering echogenic material within the gallbladder  lumen consistent with sludge. Multiple scattered superimposed tiny stones indicated by sonographer. Gallbladder wall mildly thickened up to 4 mm. No free pericholecystic fluid. Positive sonographic Murphy sign elicited on exam. Common bile duct: Diameter: 6.7 mm. At least 1 small stone measuring up to 3.6 mm present within the common bile duct. Probable adjacent sludge noted as well. Liver: No focal lesion identified. Within normal limits in parenchymal echogenicity. Portal vein is patent on color Doppler imaging with normal direction of blood flow towards the liver. Other: None. IMPRESSION: 1. Distended gallbladder with internal stones and sludge, with associated mild gallbladder wall thickening and positive sonographic Murphy sign. Clinical correlation for possible acute cholecystitis recommended. 2. At least 1 small stone and sludge within the common bile duct which measures up to 6.7 mm. Electronically Signed   By: 07/09/2019 M.D.   On: 05/09/2019 02:26  Cardiac Studies   Echo: 04/24/19   IMPRESSIONS  1. Left ventricular ejection fraction, by estimation, is 40 to 45%. The  left ventricle has mildly decreased function. The left ventricle  demonstrates regional wall motion abnormalities (see scoring  diagram/findings for description). There is mild left  ventricular hypertrophy. Left ventricular diastolic parameters are  consistent with Grade I diastolic dysfunction (impaired relaxation).  2. Right ventricular systolic function is severely reduced. The right  ventricular size is mildly enlarged. There is mildly elevated pulmonary  artery systolic pressure.  3. The mitral valve is grossly normal. Trivial mitral valve  regurgitation.  4. Tricuspid valve regurgitation is mild to moderate.  5. The aortic valve has an indeterminant number of cusps. Aortic valve  regurgitation is not visualized.  6. The inferior vena cava is dilated in size with <50% respiratory  variability,  suggesting right atrial pressure of 15 mmHg.   Cath: 04/24/19   Ost LM lesion is 25% stenosed.  Mid LAD lesion is 25% stenosed.  Prox Cx lesion is 90% stenosed.  Ost RCA to Mid RCA lesion is 100% stenosed.  A drug-eluting stent was successfully placed using a STENT RESOLUTE ONYX 3.5X38.  A drug-eluting stent was successfully placed using a STENT RESOLUTE ONYX 3.5X30.  Post intervention, there is a 0% residual stenosis.  LV end diastolic pressure is normal.  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 Severe 2 vessel CAD - culprit lesion is 100% occlusion of the proximal RCA with heavy thrombus - 90% small LCx 3. Normal LVEDP 4. Successful PCI of the RCA with thrombectomy and stenting with DES x 2.  Plan: DAPT for at least one year. IV Aggrastat for 18 hours. Start beta blocker and high dose statin. Assess LV function by Echo.     Diagnostic Dominance: Right  Intervention     Patient Profile     Makana Rostad is a 69 y.o. male with a hx of CAD with recent STEMI (04/26/19) successful PCI of the RCA with DES x2, HTN, HL, Stroke, ETOH, HVC/ETOH, COPD and tobacco use who is being seen today for the evaluation of management of DAPT in the setting of cholelithiasis with distended gallbladder and stones and sludge in common bile duct in need for ERCP on 05/13/2019.   Assessment & Plan    1.  CAD, recent NSTEMI April 24, 2019: Status post DES stenting x2 to RCA along thrombectomy for extensive thrombus with insertion of Resolute 3.5x38 and 3.5x30 DES stents.  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  Presently pain-free.  He had an episode of mild chest discomfort last evening in the setting of increasing right upper quadrant discomfort.  Now pain-free able to ambulate halls without difficulty.  2.  Antiplatelet therapy: Patient needs to  be on DAPT for minimum of 1 year following his ACS.  In anticipation of planned ERCP on Friday, last dose of Brilinta was March 8 in a.m.  He was started on Aggrastat for glycoprotein 2b3a inhibition last evening, with dosing per pharmacy.  3.  PVCs with transient ventricular trigeminy reported by central monitoring; BP has been low.  Will attempt to add very low-dose beta-blocker therapy with carvedilol 3.125 mg twice a day.   4.  Cholelithiasis and choledocholithiasis with distended gallbladder, common bile duct stone with sludge.  5.  Elevated LFTs: Bilirubin increased today to  4.2.  Mild transaminase elevation.  Will reduce atorvastatin from 80 mg down to 20 mg presently  6.  Hyperlipidemia with target LDL less than 70.  As per above, with increased bilirubin and mild increased transaminases today will reduce atorvastatin dose over the short-term.  7.  Tobacco abuse: Last cigarette was 3 days ago.  I discussed the importance of complete smoking cessation.  Signed, Lennette Bihari, MD, [facility] 05/10/2019, 11:34 AM

## 2019-05-10 NOTE — Plan of Care (Signed)

## 2019-05-10 NOTE — Progress Notes (Signed)
Pharmacy Consult Note - Initial Consult  Pharmacy Consult for Aggrastat infusion  Indication:  No Known Allergies  Patient Measurements: Height: _0  (180.3 cm) Weight: 155 lb 13.8 oz (70.7 kg) IBW/kg (Calculated) : 75.3   Vital Signs: Temp: 98.3 F (36.8 C) (03/09 1346) Temp Source: Oral (03/09 1346) BP: 110/60 (03/09 1346) Pulse Rate: 90 (03/09 1346)  Labs: Recent Labs    05/09/19 0122 05/09/19 0122 05/09/19 0438 05/09/19 0830 05/10/19 0225  HGB 14.5   < > 14.5  --  11.5*  HCT 44.3  --  44.1  --  34.0*  PLT 269  --  248  --  197  CREATININE 0.86  --  0.88  --  1.32*  TROPONINIHS 45*  --   --  47*  --    < > = values in this interval not displayed.    Estimated Creatinine Clearance: 52.8 mL/min (A) (by C-G formula based on SCr of 1.32 mg/dL (H)).   Medical History: Past Medical History:  Diagnosis Date  . Abdominal pain 05/02/2019  . Alcoholism ([place])   . CAD (coronary artery disease)    a. 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 Chicken pox   . Depression   . ETOH abuse   . Genital warts   . Hepatic steatosis   . Hepatitis C    a. s/p [name] ~ 2003 in [place], [place] => stopped after 6 mos due to neutropenia - viral load noted to be low at that time;  no follow up since;   b. abdominal u/s 11/13:  diffuse hepatic steatosis and/or hepatocellular disease    . Hx of echocardiogram    a. Echo 01/06/12: Mild focal basal hypertrophy the septum, vigorous LV, EF 65-70%, Gr 2 diast dysfn, trivial AI, MAC, trivial MR, trivial TR, PASP 36  . Hyperlipidemia   . Hypertension   . Non-STEMI (non-ST elevated myocardial infarction) ([place])   . Stroke [facility])    "minor" pt  reported  . Thrombocytopenia ([place])   . Tobacco abuse     Medications:  Medications Prior to Admission  Medication Sig Dispense Refill Last Dose  . albuterol (VENTOLIN HFA) 108 (90 Base) MCG/ACT inhaler Inhale 2 puffs into the lungs every 6 (six) hours as needed for wheezing or shortness of breath. 18 g 6 05/08/2019 at Unknown time  . aspirin EC 81 MG tablet Take 81 mg by mouth daily.   05/08/2019 at Unknown time  . atorvastatin (LIPITOR) 80 MG tablet Take 1 tablet (80 mg total) by mouth daily at 6 PM. 30 tablet 6 05/08/2019 at Unknown time  . colchicine 0.6 MG tablet Take 1 tablet (0.6 mg total) by mouth daily. 10 tablet 0 05/08/2019 at Unknown time  . cyclobenzaprine (FLEXERIL) 10 MG tablet TAKE 1 TABLET BY MOUTH TWICE DAILY AS NEEDED FOR MUSCLE SPASM (Patient taking differently: Take 10 mg by mouth daily as needed for muscle spasms. ) 30 tablet 3 Past Month at Unknown time  . fluticasone (FLOVENT HFA) 110 MCG/ACT inhaler Inhale 2 puffs into the lungs 2 (two) times daily. 1 Inhaler 3 05/08/2019 at Unknown time  . metoprolol succinate (TOPROL-XL)  25 MG 24 hr tablet Take 1 tablet (25 mg total) by mouth daily. 30 tablet 6 05/08/2019 at 0930  . Multiple Vitamin (MULTIVITAMIN WITH MINERALS) TABS tablet Take 1 tablet by mouth daily.   05/08/2019 at Unknown time  . nitroGLYCERIN (NITROSTAT) 0.4 MG SL tablet Place 1 tablet (0.4 mg total) under the tongue every 5 (five) minutes as needed for chest pain. 25 tablet 1 unknown  . ticagrelor (BRILINTA) 90 MG TABS tablet Take 1 tablet (90 mg total) by mouth 2 (two) times daily. 60 tablet 11 05/08/2019 at 2300    Assessment: 69 y.o male with with a hx of CAD with recent STEMI (04/26/19) successful PCI of the RCA with DES x2, currently on Brilinta post PCI.  He presented back to ED 05/09/19 with abdominal pain. GI consulted, needs ERCP.  Plan for ERCP on Friday 05/13/19. Brilinta needs to be on hold for at least 5 days.   Per Cardiology, pharmacy consulted to start Aggrastat IV  infusion while holding Brilinta, CrCl is >60 ml/min. Will start the Aggrastat infusion when next Brilinta dose would have been due.  Last Brilinta dose given 05/09/19 at 10AM.   Today the SCr has increased to 1.32 , CrCl  down to 52 ml/min  Will need to adjust dose for  crcl <60 ml/min, decrease Aggrastat rate to 0.075 mcg/kg/min. Hgb 14.5>11.5, pltc [phone]  No bleeding reported  Goal of Therapy:  Monitor platelets by anticoagulation protocol: Yes   Plan:  Decrease Aggrastat rate to 0.075 mcg/kg/minute. Monitor CBC daily, monitor for bleeding  Thank you for allowing pharmacy to be part of this patients care team. Nicole Cella, [place] Pharmacist [phone] Please check AMION for all [place] phone numbers After 10:00 PM, call [place] [phone] 05/10/2019,2:31 PM

## 2019-05-10 NOTE — Progress Notes (Signed)
PROGRESS NOTE  Hunter Mcmillan PCH:[id number] DOB: June 17, 1950 DOA: 05/09/2019 PCP: [name], MD  HPI/Recap of past 24 hours: HPI from Dr Ellen Henri Sease is a 69 y.o. male with medical history significant of recent STEMI April 24, 2019 discharge and was doing well and then was readmitted on 04/30/2019 with symptomatic cholelithiasis.  Patient was admitted and medically treated with conservative management and he improved and was discharged on March 2 of 2021 with resolution of his abdominal pain. Patient comes back again, approximately 15 days out from his STEMI with right upper quadrant pain again with associated nausea vomiting. General surgery was called to evaluate the patient. Patient is being referred for admission for again possible acute cholecystitis with symptomatic choledocholithiasis.    Overnight, patient reported chest pain resolved with nitroglycerin, denies any further pain this morning, met patient ambulates in the hallway without any chest pain or significant shortness of breath.  Patient still with some mild abdominal pain, denies any nausea/vomiting, no further fever spikes.   Assessment/Plan: Principal Problem:   Cholelithiasis Active Problems:   Hx of non-ST elevation myocardial infarction (NSTEMI)   Cocaine abuse (HCC)   COPD (chronic obstructive pulmonary disease) (HCC)   Essential hypertension   Acute cholecystitis  ??Sepsis likely 2/2 ??acute cholecystitis/Choledocholithiasis Last fever spike of 101.1 was on 05/09/2019, currently afebrile with resolving leukocytosis BC X 2 NGTD CXR with mild bilateral infrahilar bronchitis and interstitial opacities, likely atelectasis versus inflammatory UA unremarkable LFTs have trended up, T bili now 4.2 RUQ ultrasound showed possible acute cholecystitis, with one small stone measuring up to 3.6 mm present in the CBD measuring up to 6.7 mm General surgery consulted, recommend ERCP GI consulted, due to  recent STEMI and on Brilinta, will plan for ERCP on 05/13/2019 Cardiology consulted, due to recurrence of acute cholecystitis, recommend bridging with Aggrastat and holding Brilinta until ERCP is done (pharmacy consulted by cardiology) Continue IV Zosyn, vancomycin, may DC Vanco if MRSA PCR is negative and no fever for greater than 24 hours GI okay to advance to low-fat diet DC IV fluids Pain management  Hypotension SBP noted to be in the 80s Status post gentle hydration, currently stopped due to history of recent STEMI with EF 40 to 45% Monitor closely on telemetry  CAD with recent STEMI On 2/21 had inferior STEMI with PCI/DES x2 Currently chest pain-free Cardiology on board, okay to hold Brilinta and bridge with Aggrastat Continue aspirin, switch to low-dose p.o. Coreg due to hypotension Will place patient on telemetry  Hyperlipidemia Continue Lipitor  COPD Continue nebs  Tobacco/polysubstance abuse Advised to quit        Malnutrition Type:      Malnutrition Characteristics:      Nutrition Interventions:       Estimated body mass index is 21.74 kg/m as calculated from the following:   Height as of this encounter: 5' 11"  (1.803 m).   Weight as of this encounter: 70.7 kg.     Code Status: Full  Family Communication: Discussed extensively with patient  Disposition Plan: Patient will need ERCP, which will be done on Friday.  Likely discharge after ERCP once stable.  Likely home   Consultants:  GI  Cardiology  General surgery  Procedures:  None  Antimicrobials:  Zosyn  Vancomycin  DVT prophylaxis: SCDs for now due to upcoming ERCP   Objective: Vitals:   05/10/19 0938 05/10/19 1040 05/10/19 1346 05/10/19 1540  BP: 99/60 (!) 101/58 110/60 93/65  Pulse: 92 92 90  93  Resp:  18  18  Temp:  98.4 F (36.9 C) 98.3 F (36.8 C) 99.1 F (37.3 C)  TempSrc:  Oral Oral Oral  SpO2:  100% 98% 97%  Weight:      Height:        Intake/Output  Summary (Last 24 hours) at 05/10/2019 1554 Last data filed at 05/10/2019 1258 Gross per 24 hour  Intake 3614.62 ml  Output 600 ml  Net 3014.62 ml   Filed Weights   05/09/19 0312 05/09/19 0452 05/10/19 0500  Weight: 68 kg 66.3 kg 70.7 kg    Exam:  General: NAD   Cardiovascular: S1, S2 present  Respiratory: CTAB  Abdomen: Soft, +tender RUQ, nondistended, bowel sounds present  Musculoskeletal: No bilateral pedal edema noted  Skin: Normal  Psychiatry: Normal mood   Data Reviewed: CBC: Recent Labs  Lab 05/09/19 0122 05/09/19 0438 05/10/19 0225  WBC 10.6* 11.5* 9.2  NEUTROABS 8.8*  --  7.6  HGB 14.5 14.5 11.5*  HCT 44.3 44.1 34.0*  MCV 99.8 99.1 96.9  PLT 269 248 119   Basic Metabolic Panel: Recent Labs  Lab 05/09/19 0122 05/09/19 0438 05/10/19 0225  NA 134* 135 133*  K 4.1 4.4 4.8  CL 99 102 99  CO2 21* 22 24  GLUCOSE 192* 123* 105*  BUN 12 10 17   CREATININE 0.86 0.88 1.32*  CALCIUM 9.3 9.2 8.3*   GFR: Estimated Creatinine Clearance: 52.8 mL/min (A) (by C-G formula based on SCr of 1.32 mg/dL (H)). Liver Function Tests: Recent Labs  Lab 05/09/19 0122 05/09/19 0438 05/10/19 0225  AST 34 25 62*  ALT 48* 42 52*  ALKPHOS 158* 156* 227*  BILITOT 1.7* 1.7* 4.2*  PROT 7.6 7.7 5.8*  ALBUMIN 3.3* 3.2* 2.3*   Recent Labs  Lab 05/09/19 0122  LIPASE 18   No results for input(s): AMMONIA in the last 168 hours. Coagulation Profile: No results for input(s): INR, PROTIME in the last 168 hours. Cardiac Enzymes: No results for input(s): CKTOTAL, CKMB, CKMBINDEX, TROPONINI in the last 168 hours. BNP (last 3 results) No results for input(s): PROBNP in the last 8760 hours. HbA1C: No results for input(s): HGBA1C in the last 72 hours. CBG: No results for input(s): GLUCAP in the last 168 hours. Lipid Profile: No results for input(s): CHOL, HDL, LDLCALC, TRIG, CHOLHDL, LDLDIRECT in the last 72 hours. Thyroid Function Tests: No results for input(s): TSH,  T4TOTAL, FREET4, T3FREE, THYROIDAB in the last 72 hours. Anemia Panel: No results for input(s): VITAMINB12, FOLATE, FERRITIN, TIBC, IRON, RETICCTPCT in the last 72 hours. Urine analysis:    Component Value Date/Time   COLORURINE AMBER (A) 05/09/2019 0122   APPEARANCEUR CLEAR 05/09/2019 0122   LABSPEC 1.023 05/09/2019 0122   PHURINE 6.0 05/09/2019 0122   GLUCOSEU 50 (A) 05/09/2019 0122   HGBUR NEGATIVE 05/09/2019 0122   BILIRUBINUR NEGATIVE 05/09/2019 0122   KETONESUR NEGATIVE 05/09/2019 0122   PROTEINUR 100 (A) 05/09/2019 0122   UROBILINOGEN 1.0 01/05/2012 1400   NITRITE NEGATIVE 05/09/2019 0122   LEUKOCYTESUR NEGATIVE 05/09/2019 0122   Sepsis Labs: @LABRCNTIP (procalcitonin:4,lacticidven:4)  ) Recent Results (from the past 240 hour(s))  Respiratory Panel by RT PCR (Flu A&B, Covid) - Nasopharyngeal Swab     Status: None   Collection Time: 05/09/19  3:02 AM   Specimen: Nasopharyngeal Swab  Result Value Ref Range Status   SARS Coronavirus 2 by RT PCR NEGATIVE NEGATIVE Final    Comment: (NOTE) SARS-CoV-2 target nucleic acids are NOT DETECTED. The  SARS-CoV-2 RNA is generally detectable in upper respiratoy specimens during the acute phase of infection. The lowest concentration of SARS-CoV-2 viral copies this assay can detect is 131 copies/mL. A negative result does not preclude SARS-Cov-2 infection and should not be used as the sole basis for treatment or other patient management decisions. A negative result may occur with  improper specimen collection/handling, submission of specimen other than nasopharyngeal swab, presence of viral mutation(s) within the areas targeted by this assay, and inadequate number of viral copies (<131 copies/mL). A negative result must be combined with clinical observations, patient history, and epidemiological information. The expected result is Negative. Fact Sheet for Patients:  PinkCheek.be Fact Sheet for Healthcare  Providers:  GravelBags.it This test is not yet ap proved or cleared by the [place] FDA and  has been authorized for detection and/or diagnosis of SARS-CoV-2 by FDA under an Emergency Use Authorization (EUA). This EUA will remain  in effect (meaning this test can be used) for the duration of the COVID-19 declaration under Section 564(b)(1) of the Act, 21 U.S.C. section 360bbb-3(b)(1), unless the authorization is terminated or revoked sooner.    Influenza A by PCR NEGATIVE NEGATIVE Final   Influenza B by PCR NEGATIVE NEGATIVE Final    Comment: (NOTE) The Xpert Xpress SARS-CoV-2/FLU/RSV assay is intended as an aid in  the diagnosis of influenza from Nasopharyngeal swab specimens and  should not be used as a sole basis for treatment. Nasal washings and  aspirates are unacceptable for Xpert Xpress SARS-CoV-2/FLU/RSV  testing. Fact Sheet for Patients: PinkCheek.be Fact Sheet for Healthcare Providers: GravelBags.it This test is not yet approved or cleared by the [place] FDA and  has been authorized for detection and/or diagnosis of SARS-CoV-2 by  FDA under an Emergency Use Authorization (EUA). This EUA will remain  in effect (meaning this test can be used) for the duration of the  Covid-19 declaration under Section 564(b)(1) of the Act, 21  U.S.C. section 360bbb-3(b)(1), unless the authorization is  terminated or revoked. Performed at [facility], [place] [address], [place], [place] [address]   Culture, blood (routine x 2)     Status: None (Preliminary result)   Collection Time: 05/09/19  3:44 PM   Specimen: BLOOD  Result Value Ref Range Status   Specimen Description BLOOD RIGHT ANTECUBITAL  Final   Special Requests   Final    BOTTLES DRAWN AEROBIC ONLY Blood Culture adequate volume   Culture   Final    NO GROWTH < 24 HOURS Performed at [facility], [place] [address],  [place], [place] [address]    Report Status PENDING  Incomplete  Culture, blood (routine x 2)     Status: None (Preliminary result)   Collection Time: 05/09/19  3:49 PM   Specimen: BLOOD RIGHT HAND  Result Value Ref Range Status   Specimen Description BLOOD RIGHT HAND  Final   Special Requests   Final    BOTTLES DRAWN AEROBIC ONLY Blood Culture adequate volume   Culture   Final    NO GROWTH < 24 HOURS Performed at [facility], [place] [address], [place], [place] [address]    Report Status PENDING  Incomplete      Studies: No results found.  Scheduled Meds: . aspirin EC  81 mg Oral Daily  . atorvastatin  20 mg Oral q1800  . budesonide  0.5 mg Inhalation BID  . carvedilol  3.125 mg Oral BID WC  . colchicine  0.6 mg Oral Daily  . mouth rinse  15 mL Mouth Rinse BID  . multivitamin with minerals  1 tablet Oral Daily    Continuous Infusions: . piperacillin-tazobactam (ZOSYN)  IV 3.375 g (05/10/19 1418)  . tirofiban 0.075 mcg/kg/min (05/10/19 1427)  . [START ON 05/11/2019] vancomycin       LOS: 1 day     Alma Friendly, MD [facility]  If 7PM-7AM, please contact night-coverage [email or web address] 05/10/2019, 3:54 PM

## 2019-05-10 NOTE — Progress Notes (Addendum)
Subjective: Patient states," I think I passed a stone yesterday between 8-9 PM today, as I felt immediate relief in right upper quadrant abdominal pain after an episode of coughing". He is requesting for his diet to be advanced.   Objective: Vital signs in last 24 hours: Temp:  [98.1 F (36.7 C)-101.5 F (38.6 C)] 98.1 F (36.7 C) (03/09 0656) Pulse Rate:  [82-109] 86 (03/09 0656) Resp:  [18-20] 20 (03/09 0656) BP: (84-120)/(50-74) 84/56 (03/09 0656) SpO2:  [94 %-100 %] 97 % (03/09 0656) Weight:  [70.7 kg] 70.7 kg (03/09 0500) Weight change: 2.66 kg Last BM Date: 05/08/19  PE: Not in distress GENERAL: No pallor, mild icterus ABDOMEN: Continues to have right upper quadrant tenderness, no rebound, bowel sounds normoactive, soft otherwise EXTREMITIES: No deformity  Lab Results: Results for orders placed or performed during the hospital encounter of 05/09/19 (from the past 48 hour(s))  CBC with Differential     Status: Abnormal   Collection Time: 05/09/19  1:22 AM  Result Value Ref Range   WBC 10.6 (H) 4.0 - 10.5 K/uL   RBC 4.44 4.22 - 5.81 MIL/uL   Hemoglobin 14.5 13.0 - 17.0 g/dL   HCT 44.3 39.0 - 52.0 %   MCV 99.8 80.0 - 100.0 fL   MCH 32.7 26.0 - 34.0 pg   MCHC 32.7 30.0 - 36.0 g/dL   RDW 14.0 11.5 - 15.5 %   Platelets 269 150 - 400 K/uL   nRBC 0.0 0.0 - 0.2 %   Neutrophils Relative % 83 %   Neutro Abs 8.8 (H) 1.7 - 7.7 K/uL   Lymphocytes Relative 8 %   Lymphs Abs 0.9 0.7 - 4.0 K/uL   Monocytes Relative 9 %   Monocytes Absolute 0.9 0.1 - 1.0 K/uL   Eosinophils Relative 0 %   Eosinophils Absolute 0.0 0.0 - 0.5 K/uL   Basophils Relative 0 %   Basophils Absolute 0.0 0.0 - 0.1 K/uL   Immature Granulocytes 0 %   Abs Immature Granulocytes 0.04 0.00 - 0.07 K/uL    Comment: Performed at [facility], [address], [place], [place] [address]  Comprehensive metabolic panel     Status: Abnormal   Collection Time: 05/09/19  1:22 AM  Result Value Ref Range   Sodium  134 (L) 135 - 145 mmol/L   Potassium 4.1 3.5 - 5.1 mmol/L   Chloride 99 98 - 111 mmol/L   CO2 21 (L) 22 - 32 mmol/L   Glucose, Bld 192 (H) 70 - 99 mg/dL    Comment: Glucose reference range applies only to samples taken after fasting for at least 8 hours.   BUN 12 8 - 23 mg/dL   Creatinine, Ser 0.86 0.61 - 1.24 mg/dL   Calcium 9.3 8.9 - 10.3 mg/dL   Total Protein 7.6 6.5 - 8.1 g/dL   Albumin 3.3 (L) 3.5 - 5.0 g/dL   AST 34 15 - 41 U/L   ALT 48 (H) 0 - 44 U/L   Alkaline Phosphatase 158 (H) 38 - 126 U/L   Total Bilirubin 1.7 (H) 0.3 - 1.2 mg/dL   GFR calc non Af Amer >60 >60 mL/min   GFR calc Af Amer >60 >60 mL/min   Anion gap 14 5 - 15    Comment: Performed at [facility] [address] [place], [place] [address]  Lipase, blood     Status: None   Collection Time: 05/09/19  1:22 AM  Result Value Ref  Range   Lipase 18 11 - 51 U/L    Comment: Performed at [facility], [address], [place], [place] [address]  Troponin I (High Sensitivity)     Status: Abnormal   Collection Time: 05/09/19  1:22 AM  Result Value Ref Range   Troponin I (High Sensitivity) 45 (H) <18 ng/L    Comment: (NOTE) Elevated high sensitivity troponin I (hsTnI) values and significant  changes across serial measurements may suggest ACS but many other  chronic and acute conditions are known to elevate hsTnI results.  Refer to the "Links" section for chest pain algorithms and additional  guidance. Performed at [facility], [address], [place], [place] [address]   Urinalysis, Routine w reflex microscopic     Status: Abnormal   Collection Time: 05/09/19  1:22 AM  Result Value Ref Range   Color, Urine AMBER (A) YELLOW    Comment: BIOCHEMICALS MAY BE AFFECTED BY COLOR   APPearance CLEAR CLEAR   Specific Gravity, Urine 1.023 1.005 - 1.030   pH 6.0 5.0 - 8.0   Glucose, UA 50 (A) NEGATIVE mg/dL   Hgb urine dipstick NEGATIVE NEGATIVE   Bilirubin Urine NEGATIVE NEGATIVE   Ketones, ur  NEGATIVE NEGATIVE mg/dL   Protein, ur 659 (A) NEGATIVE mg/dL   Nitrite NEGATIVE NEGATIVE   Leukocytes,Ua NEGATIVE NEGATIVE   RBC / HPF 0-5 0 - 5 RBC/hpf   WBC, UA 0-5 0 - 5 WBC/hpf   Bacteria, UA RARE (A) NONE SEEN   Squamous Epithelial / LPF 0-5 0 - 5   Mucus PRESENT    Hyaline Casts, UA PRESENT     Comment: Performed at [facility], [address] [place], [place] [address]  Respiratory Panel by RT PCR (Flu A&B, Covid) - Nasopharyngeal Swab     Status: None   Collection Time: 05/09/19  3:02 AM   Specimen: Nasopharyngeal Swab  Result Value Ref Range   SARS Coronavirus 2 by RT PCR NEGATIVE NEGATIVE    Comment: (NOTE) SARS-CoV-2 target nucleic acids are NOT DETECTED. The SARS-CoV-2 RNA is generally detectable in upper respiratoy specimens during the acute phase of infection. The lowest concentration of SARS-CoV-2 viral copies this assay can detect is 131 copies/mL. A negative result does not preclude SARS-Cov-2 infection and should not be used as the sole basis for treatment or other patient management decisions. A negative result may occur with  improper specimen collection/handling, submission of specimen other than nasopharyngeal swab, presence of viral mutation(s) within the areas targeted by this assay, and inadequate number of viral copies (<131 copies/mL). A negative result must be combined with clinical observations, patient history, and epidemiological information. The expected result is Negative. Fact Sheet for Patients:  https://www.moore.com/ Fact Sheet for Healthcare Providers:  https://www.young.biz/ This test is not yet ap proved or cleared by the [place] FDA and  has been authorized for detection and/or diagnosis of SARS-CoV-2 by FDA under an Emergency Use Authorization (EUA). This EUA will remain  in effect (meaning this test can be used) for the duration of the COVID-19 declaration under Section 564(b)(1) of  the Act, 21 U.S.C. section 360bbb-3(b)(1), unless the authorization is terminated or revoked sooner.    Influenza A by PCR NEGATIVE NEGATIVE   Influenza B by PCR NEGATIVE NEGATIVE    Comment: (NOTE) The Xpert Xpress SARS-CoV-2/FLU/RSV assay is intended as an aid in  the diagnosis of influenza from Nasopharyngeal swab specimens and  should not be used  as a sole basis for treatment. Nasal washings and  aspirates are unacceptable for Xpert Xpress SARS-CoV-2/FLU/RSV  testing. Fact Sheet for Patients: https://www.moore.com/ Fact Sheet for Healthcare Providers: https://www.young.biz/ This test is not yet approved or cleared by the [place] FDA and  has been authorized for detection and/or diagnosis of SARS-CoV-2 by  FDA under an Emergency Use Authorization (EUA). This EUA will remain  in effect (meaning this test can be used) for the duration of the  Covid-19 declaration under Section 564(b)(1) of the Act, 21  U.S.C. section 360bbb-3(b)(1), unless the authorization is  terminated or revoked. Performed at [facility], [address] [place], [place] [address]   Urine rapid drug screen (hosp performed)     Status: Abnormal   Collection Time: 05/09/19  4:04 AM  Result Value Ref Range   Opiates NONE DETECTED NONE DETECTED   Cocaine NONE DETECTED NONE DETECTED   Benzodiazepines NONE DETECTED NONE DETECTED   Amphetamines NONE DETECTED NONE DETECTED   Tetrahydrocannabinol POSITIVE (A) NONE DETECTED   Barbiturates NONE DETECTED NONE DETECTED    Comment: (NOTE) DRUG SCREEN FOR MEDICAL PURPOSES ONLY.  IF CONFIRMATION IS NEEDED FOR ANY PURPOSE, NOTIFY LAB WITHIN 5 DAYS. LOWEST DETECTABLE LIMITS FOR URINE DRUG SCREEN Drug Class                     Cutoff (ng/mL) Amphetamine and metabolites    1000 Barbiturate and metabolites    200 Benzodiazepine                 200 Tricyclics and metabolites     300 Opiates and metabolites         300 Cocaine and metabolites        300 THC                            50 Performed at [facility], [address], [place], [place] [address]   Comprehensive metabolic panel     Status: Abnormal   Collection Time: 05/09/19  4:38 AM  Result Value Ref Range   Sodium 135 135 - 145 mmol/L   Potassium 4.4 3.5 - 5.1 mmol/L   Chloride 102 98 - 111 mmol/L   CO2 22 22 - 32 mmol/L   Glucose, Bld 123 (H) 70 - 99 mg/dL    Comment: Glucose reference range applies only to samples taken after fasting for at least 8 hours.   BUN 10 8 - 23 mg/dL   Creatinine, Ser 3.32 0.61 - 1.24 mg/dL   Calcium 9.2 8.9 - 95.1 mg/dL   Total Protein 7.7 6.5 - 8.1 g/dL   Albumin 3.2 (L) 3.5 - 5.0 g/dL   AST 25 15 - 41 U/L   ALT 42 0 - 44 U/L   Alkaline Phosphatase 156 (H) 38 - 126 U/L   Total Bilirubin 1.7 (H) 0.3 - 1.2 mg/dL   GFR calc non Af Amer >60 >60 mL/min   GFR calc Af Amer >60 >60 mL/min   Anion gap 11 5 - 15    Comment: Performed at [facility], [address] [place], [place] [address]  CBC     Status: Abnormal   Collection Time: 05/09/19  4:38 AM  Result Value Ref Range   WBC 11.5 (H) 4.0 - 10.5 K/uL   RBC 4.45 4.22 - 5.81 MIL/uL   Hemoglobin 14.5 13.0 - 17.0 g/dL  HCT 44.1 39.0 - 52.0 %   MCV 99.1 80.0 - 100.0 fL   MCH 32.6 26.0 - 34.0 pg   MCHC 32.9 30.0 - 36.0 g/dL   RDW 53.9 76.7 - 34.1 %   Platelets 248 150 - 400 K/uL   nRBC 0.0 0.0 - 0.2 %    Comment: Performed at [facility], [address] [place], [place] [address]  Troponin I (High Sensitivity)     Status: Abnormal   Collection Time: 05/09/19  8:30 AM  Result Value Ref Range   Troponin I (High Sensitivity) 47 (H) <18 ng/L    Comment: (NOTE) Elevated high sensitivity troponin I (hsTnI) values and significant  changes across serial measurements may suggest ACS but many other  chronic and acute conditions are known to elevate hsTnI results.  Refer to the "Links" section for chest pain algorithms and additional   guidance. Performed at [facility], [address], [place], [place] [address]   Comprehensive metabolic panel     Status: Abnormal   Collection Time: 05/10/19  2:25 AM  Result Value Ref Range   Sodium 133 (L) 135 - 145 mmol/L   Potassium 4.8 3.5 - 5.1 mmol/L   Chloride 99 98 - 111 mmol/L   CO2 24 22 - 32 mmol/L   Glucose, Bld 105 (H) 70 - 99 mg/dL    Comment: Glucose reference range applies only to samples taken after fasting for at least 8 hours.   BUN 17 8 - 23 mg/dL   Creatinine, Ser 3.53 (H) 0.61 - 1.24 mg/dL   Calcium 8.3 (L) 8.9 - 10.3 mg/dL   Total Protein 5.8 (L) 6.5 - 8.1 g/dL   Albumin 2.3 (L) 3.5 - 5.0 g/dL   AST 62 (H) 15 - 41 U/L   ALT 52 (H) 0 - 44 U/L   Alkaline Phosphatase 227 (H) 38 - 126 U/L   Total Bilirubin 4.2 (H) 0.3 - 1.2 mg/dL   GFR calc non Af Amer 55 (L) >60 mL/min   GFR calc Af Amer >60 >60 mL/min   Anion gap 10 5 - 15    Comment: Performed at [facility], [address], [place], [place] [address]  CBC with Differential/Platelet     Status: Abnormal   Collection Time: 05/10/19  2:25 AM  Result Value Ref Range   WBC 9.2 4.0 - 10.5 K/uL   RBC 3.51 (L) 4.22 - 5.81 MIL/uL   Hemoglobin 11.5 (L) 13.0 - 17.0 g/dL   HCT 26.8 (L) 34.1 - 96.2 %   MCV 96.9 80.0 - 100.0 fL   MCH 32.8 26.0 - 34.0 pg   MCHC 33.8 30.0 - 36.0 g/dL   RDW 22.9 79.8 - 92.1 %   Platelets 197 150 - 400 K/uL   nRBC 0.0 0.0 - 0.2 %   Neutrophils Relative % 82 %   Neutro Abs 7.6 1.7 - 7.7 K/uL   Lymphocytes Relative 5 %   Lymphs Abs 0.5 (L) 0.7 - 4.0 K/uL   Monocytes Relative 9 %   Monocytes Absolute 0.9 0.1 - 1.0 K/uL   Eosinophils Relative 3 %   Eosinophils Absolute 0.2 0.0 - 0.5 K/uL   Basophils Relative 0 %   Basophils Absolute 0.0 0.0 - 0.1 K/uL   Immature Granulocytes 1 %   Abs Immature Granulocytes 0.05 0.00 - 0.07 K/uL    Comment: Performed at [facility], [address], [place], [place] [address]  Studies/Results: DG Chest Port 1  View  Result Date: 05/09/2019 CLINICAL DATA:  Recent STEMI.  Symptomatic cholelithiasis. EXAM: PORTABLE CHEST 1 VIEW COMPARISON:  April 24, 2019 FINDINGS: There is stable borderline cardiomegaly. Inspiration is shallow and central pulmonary vascular congestion more pronounced. Mild bilateral infrahilar bronchial wall thickening has developed. Interstitial opacities in the mid lung zones may be atelectatic or inflammatory. There is no obvious pleural effusion or overt pulmonary edema or pneumothorax. Moderate skeletal degenerative change, aortic knob calcified atherosclerosis and telemetry leads are present. There is no free subdiaphragmatic air. IMPRESSION: Stable borderline cardiomegaly with mild bilateral infrahilar bronchitis and interstitial opacities in both mid lung zones that may be atelectatic or inflammatory. No pulmonary edema or obvious pleural effusion or free subdiaphragmatic air. Electronically Signed   By: Laurence Ferrari   On: 05/09/2019 16:02   US Abdomen Limited RUQ  Result Date: 05/09/2019 CLINICAL DATA:  Initial evaluation for acute right upper quadrant pain. EXAM: ULTRASOUND ABDOMEN LIMITED RIGHT UPPER QUADRANT COMPARISON:  Recent MRI from 05/02/2019. FINDINGS: Gallbladder: Gallbladder distended measuring up to 13.2 cm in length. Layering echogenic material within the gallbladder lumen consistent with sludge. Multiple scattered superimposed tiny stones indicated by sonographer. Gallbladder wall mildly thickened up to 4 mm. No free pericholecystic fluid. Positive sonographic Murphy sign elicited on exam. Common bile duct: Diameter: 6.7 mm. At least 1 small stone measuring up to 3.6 mm present within the common bile duct. Probable adjacent sludge noted as well. Liver: No focal lesion identified. Within normal limits in parenchymal echogenicity. Portal vein is patent on color Doppler imaging with normal direction of blood flow towards the liver. Other: None. IMPRESSION: 1. Distended  gallbladder with internal stones and sludge, with associated mild gallbladder wall thickening and positive sonographic Murphy sign. Clinical correlation for possible acute cholecystitis recommended. 2. At least 1 small stone and sludge within the common bile duct which measures up to 6.7 mm. Electronically Signed   By: Rise Mu M.D.   On: 05/09/2019 02:26    Medications: I have reviewed the patient's current medications.  Assessment: Distended gallbladder with stones and sludge with mild gallbladder wall thickening and positive sonographic Murphy sign At least one small stone and sludge within common bile duct which itself measures 6.7 mm T bili 4.2/AST 62/ALT 52/ALP 227 No leukocytosis  Recent STEMI in 04/26/2019, successful PCI of RCA with drug-eluting stents x2 with history of hypertension, dyslipidemia, stroke, alcohol use, COPD and tobacco use   Plan: Patient was started on Aggrastat infusion from yesterday, last dose of Brilinta was yesterday morning. Aggrastat to be discontinued minimum 4 hours prior to ERCP with plans to restart Brilinta following ERCP. ERCP planned for Friday We will advance patient's diet to low-fat for now. We will follow up periodically until ERCP on Friday. Ideally patient will benefit from a cholecystectomy following ERCP.  Kerin Salen, MD 05/10/2019, 8:15 AM

## 2019-05-11 LAB — COMPREHENSIVE METABOLIC PANEL
ALT: 44 U/L (ref 0–44)
AST: 55 U/L — ABNORMAL HIGH (ref 15–41)
Albumin: 2.2 g/dL — ABNORMAL LOW (ref 3.5–5.0)
Alkaline Phosphatase: 262 U/L — ABNORMAL HIGH (ref 38–126)
Anion gap: 9 (ref 5–15)
BUN: 16 mg/dL (ref 8–23)
CO2: 22 mmol/L (ref 22–32)
Calcium: 8.5 mg/dL — ABNORMAL LOW (ref 8.9–10.3)
Chloride: 103 mmol/L (ref 98–111)
Creatinine, Ser: 0.99 mg/dL (ref 0.61–1.24)
GFR calc Af Amer: >60 mL/min (ref >60)
GFR calc non Af Amer: >60 mL/min (ref >60)
Glucose, Bld: 125 mg/dL — ABNORMAL HIGH (ref 70–99)
Potassium: 4 mmol/L (ref 3.5–5.1)
Sodium: 134 mmol/L — ABNORMAL LOW (ref 135–145)
Total Bilirubin: 3.3 mg/dL — ABNORMAL HIGH (ref 0.3–1.2)
Total Protein: 5.7 g/dL — ABNORMAL LOW (ref 6.5–8.1)

## 2019-05-11 LAB — CBC WITH DIFFERENTIAL/PLATELET
Abs Immature Granulocytes: 0.05 10*3/uL (ref 0.00–0.07)
Abs Immature Granulocytes: 0.03 10*3/uL (ref 0.00–0.07)
Basophils Absolute: 0 10*3/uL (ref 0.0–0.1)
Basophils Absolute: 0 10*3/uL (ref 0.0–0.1)
Basophils Relative: 0 %
Basophils Relative: 0 %
Eosinophils Absolute: 0.2 10*3/uL (ref 0.0–0.5)
Eosinophils Absolute: 0.1 10*3/uL (ref 0.0–0.5)
Eosinophils Relative: 3 %
Eosinophils Relative: 1 %
HCT: 34 % — ABNORMAL LOW (ref 39.0–52.0)
HCT: 33.6 % — ABNORMAL LOW (ref 39.0–52.0)
Hemoglobin: 11.5 g/dL — ABNORMAL LOW (ref 13.0–17.0)
Hemoglobin: 11.6 g/dL — ABNORMAL LOW (ref 13.0–17.0)
Immature Granulocytes: 1 %
Immature Granulocytes: 0 %
Lymphocytes Relative: 5 %
Lymphocytes Relative: 17 %
Lymphs Abs: 0.5 10*3/uL — ABNORMAL LOW (ref 0.7–4.0)
Lymphs Abs: 1.1 10*3/uL (ref 0.7–4.0)
MCH: 32.8 pg (ref 26.0–34.0)
MCH: 33.2 pg (ref 26.0–34.0)
MCHC: 33.8 g/dL (ref 30.0–36.0)
MCHC: 34.5 g/dL (ref 30.0–36.0)
MCV: 96.9 fL (ref 80.0–100.0)
MCV: 96.3 fL (ref 80.0–100.0)
Monocytes Absolute: 0.9 10*3/uL (ref 0.1–1.0)
Monocytes Absolute: 0.8 10*3/uL (ref 0.1–1.0)
Monocytes Relative: 9 %
Monocytes Relative: 11 %
Neutro Abs: 7.6 10*3/uL (ref 1.7–7.7)
Neutro Abs: 4.8 10*3/uL (ref 1.7–7.7)
Neutrophils Relative %: 82 %
Neutrophils Relative %: 71 %
Platelets: 197 10*3/uL (ref 150–400)
Platelets: 210 10*3/uL (ref 150–400)
RBC: 3.51 MIL/uL — ABNORMAL LOW (ref 4.22–5.81)
RBC: 3.49 MIL/uL — ABNORMAL LOW (ref 4.22–5.81)
RDW: 14 % (ref 11.5–15.5)
RDW: 14.2 % (ref 11.5–15.5)
WBC: 9.2 10*3/uL (ref 4.0–10.5)
WBC: 6.8 10*3/uL (ref 4.0–10.5)
nRBC: 0 % (ref 0.0–0.2)
nRBC: 0 % (ref 0.0–0.2)

## 2019-05-11 LAB — MRSA PCR SCREENING: MRSA by PCR: NEGATIVE

## 2019-05-11 MED ORDER — VANCOMYCIN HCL 1500 MG/300ML IV SOLN
1500.0000 mg | INTRAVENOUS | Status: DC
  Administered 2019-05-12: 1500 mg via INTRAVENOUS
  Filled 2019-05-11: qty 300

## 2019-05-11 NOTE — Progress Notes (Signed)
PROGRESS NOTE    Hunter Mcmillan  MWN:[id number] DOB: [birth year] DOA: 05/09/2019 PCP: Pearline Cables, MD    Brief Narrative:  Patient was admitted to the hospital with the working diagnosis of recurrent cholecystitis, symptomatic choledocholithiasis.  69 year old male who presented with abdominal pain.  He has significant past medical history for coronary disease status post angioplasty 04/24/19, for non-ST elevation myocardial infarction.  Recent hospitalization for cholelithiasis 04/30/19, medically managed.  At home patient had recurrent right upper quadrant abdominal pain, associated with nausea and vomiting.  Initial physical examination blood pressure 143/78, heart rate 97, respiratory rate 20, oxygen saturation 98%.  Clear to auscultation bilaterally, heart S1-S2 present rhythmic, abdomen positive tenderness the right upper quadrant, no lower extremity edema.   Abdominal ultrasonography showed distended gallbladder with internal stones and sludge, associated with gallbladder wall thickening and positive sonographic Murphy sign, at least one small stone and sludge within the common bile duct which measures up to 6.7 mm  Patient has been placed on supportive medical therapy including IV antibiotic, IV fluids and analgesics. Clopidogrel has been held and patient has been started on tirofiban IV for bridge therapy. Plan for ERCP on 03.12.21.   Assessment & Plan:   Principal Problem:   Cholelithiasis Active Problems:   Hx of non-ST elevation myocardial infarction (NSTEMI)   Cocaine abuse (HCC)   COPD (chronic obstructive pulmonary disease) (HCC)   Essential hypertension   Acute cholecystitis   1. Acute cholecystitis/ choledocholithiasis/ complicated with sepsis, end organ failure hypotension. Patient is tolerating po well, blood pressure has improved. Wbc is 6,8, cultures with no growth and patient has remained afebrile.   Will continue antibiotic therapy with Zosyn, plan for ERCP  on 03.12.   2. CAD sp angioplasty and stent placement. Patient is chest pain free, will continue aggrastat for now, holding ticagrelor. Continue to hold on carvedilol for for now.   3. Dyslipidemia. Continue statin therapy.  4. COPD/ No signs of acute exacerbation,  5. Tobacco abuse. Smoking cessation counseling.    DVT prophylaxis: enoxparin   Code Status:  full Family Communication: no family at the bedside  Disposition Plan/ discharge barriers: patient acutely will from sepsis and cholecystitis, no plans for dc now.   Consultants:   GI   Cardiology   Procedures:     Antimicrobials:   Zosyn.     Subjective: Patient had intermittent reflux and dyspepsia, but still able to tolerate po with no nausea or vomiting, no chest pain or dyspnea.   Objective: Vitals:   05/10/19 1540 05/10/19 1951 05/11/19 0406 05/11/19 0500  BP: 93/65 (!) 94/56 111/66   Pulse: 93 94 83   Resp: 18 18 19    Temp: 99.1 F (37.3 C) 98.2 F (36.8 C) 98.6 F (37 C)   TempSrc: Oral Oral Oral   SpO2: 97% 96% 97%   Weight:    70.4 kg  Height:        Intake/Output Summary (Last 24 hours) at 05/11/2019 1254 Last data filed at 05/11/2019 1120 Gross per 24 hour  Intake 2631.1 ml  Output 1950 ml  Net 681.1 ml   Filed Weights   05/09/19 0452 05/10/19 0500 05/11/19 0500  Weight: 66.3 kg 70.7 kg 70.4 kg    Examination:   General: Not in pain or dyspnea, deconditioned  Neurology: Awake and alert, non focal  E ENT: no pallor, no icterus, oral mucosa moist Cardiovascular: No JVD. S1-S2 present, rhythmic, no gallops, rubs, or murmurs. No lower extremity edema. Pulmonary:  positive breath sounds bilaterally, adequate air movement, no wheezing, rhonchi or rales. Gastrointestinal. Abdomen with no organomegaly, non tender, no rebound or guarding Skin. No rashes Musculoskeletal: no joint deformities     Data Reviewed: I have personally reviewed following labs and imaging studies  CBC: Recent  Labs  Lab 05/09/19 0122 05/09/19 0438 05/10/19 0225 05/11/19 0204  WBC 10.6* 11.5* 9.2 6.8  NEUTROABS 8.8*  --  7.6 4.8  HGB 14.5 14.5 11.5* 11.6*  HCT 44.3 44.1 34.0* 33.6*  MCV 99.8 99.1 96.9 96.3  PLT 269 248 197 185   Basic Metabolic Panel: Recent Labs  Lab 05/09/19 0122 05/09/19 0438 05/10/19 0225 05/11/19 0204  NA 134* 135 133* 134*  K 4.1 4.4 4.8 4.0  CL 99 102 99 103  CO2 21* 22 24 22   GLUCOSE 192* 123* 105* 125*  BUN 12 10 17 16   CREATININE 0.86 0.88 1.32* 0.99  CALCIUM 9.3 9.2 8.3* 8.5*   GFR: Estimated Creatinine Clearance: 70.1 mL/min (by C-G formula based on SCr of 0.99 mg/dL). Liver Function Tests: Recent Labs  Lab 05/09/19 0122 05/09/19 0438 05/10/19 0225 05/11/19 0204  AST 34 25 62* 55*  ALT 48* 42 52* 44  ALKPHOS 158* 156* 227* 262*  BILITOT 1.7* 1.7* 4.2* 3.3*  PROT 7.6 7.7 5.8* 5.7*  ALBUMIN 3.3* 3.2* 2.3* 2.2*   Recent Labs  Lab 05/09/19 0122  LIPASE 18   No results for input(s): AMMONIA in the last 168 hours. Coagulation Profile: No results for input(s): INR, PROTIME in the last 168 hours. Cardiac Enzymes: No results for input(s): CKTOTAL, CKMB, CKMBINDEX, TROPONINI in the last 168 hours. BNP (last 3 results) No results for input(s): PROBNP in the last 8760 hours. HbA1C: No results for input(s): HGBA1C in the last 72 hours. CBG: No results for input(s): GLUCAP in the last 168 hours. Lipid Profile: No results for input(s): CHOL, HDL, LDLCALC, TRIG, CHOLHDL, LDLDIRECT in the last 72 hours. Thyroid Function Tests: No results for input(s): TSH, T4TOTAL, FREET4, T3FREE, THYROIDAB in the last 72 hours. Anemia Panel: No results for input(s): VITAMINB12, FOLATE, FERRITIN, TIBC, IRON, RETICCTPCT in the last 72 hours.    Radiology Studies: I have reviewed all of the imaging during this hospital visit personally     Scheduled Meds: . aspirin EC  81 mg Oral Daily  . atorvastatin  20 mg Oral q1800  . budesonide  0.5 mg Inhalation  BID  . carvedilol  3.125 mg Oral BID WC  . colchicine  0.6 mg Oral Daily  . mouth rinse  15 mL Mouth Rinse BID  . multivitamin with minerals  1 tablet Oral Daily   Continuous Infusions: . piperacillin-tazobactam (ZOSYN)  IV 3.375 g (05/11/19 0506)  . tirofiban 0.15 mcg/kg/min (05/11/19 0859)  . [START ON 05/12/2019] vancomycin       LOS: 2 days        Cailee Blanke Gerome Apley, MD

## 2019-05-11 NOTE — Progress Notes (Signed)
Pharmacy Consult Note -  Follow up Pharmacy Consult for Aggrastat infusion  Indication:  No Known Allergies  Patient Measurements: Height: _0  (180.3 cm) Weight: 155 lb 3.3 oz (70.4 kg) IBW/kg (Calculated) : 75.3   Vital Signs: Temp: 98.6 F (37 C) (03/10 0406) Temp Source: Oral (03/10 0406) BP: 111/66 (03/10 0406) Pulse Rate: 83 (03/10 0406)  Labs: Recent Labs    05/09/19 0122 05/09/19 0122 05/09/19 0438 05/09/19 0438 05/09/19 0830 05/10/19 0225 05/11/19 0204  HGB 14.5   < > 14.5   < >  --  11.5* 11.6*  HCT Hunter.3   < > Hunter.1  --   --  34.0* 33.6*  PLT 269   < > 248  --   --  197 210  CREATININE 0.86   < > 0.88  --   --  1.32* 0.99  TROPONINIHS 45*  --   --   --  47*  --   --    < > = values in this interval not displayed.    Estimated Creatinine Clearance: 70.1 mL/min (by C-G formula based on SCr of 0.99 mg/dL).   Medical History: Past Medical History:  Diagnosis Date  . Abdominal pain 05/02/2019  . Alcoholism ([place])   . CAD (coronary artery disease)    a. 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 Chicken pox   . Depression   . ETOH abuse   . Genital warts   . Hepatic steatosis   . Hepatitis C    a. s/p [name] ~ 2003 in [place], [place] => stopped after 6 mos due to neutropenia - viral load noted to be low at that time;  no follow up since;   b. abdominal u/s 11/13:  diffuse hepatic steatosis and/or hepatocellular disease    . Hx of echocardiogram    a. Echo 01/06/12: Mild focal basal hypertrophy the septum, vigorous LV, EF 65-70%, Gr 2 diast dysfn, trivial AI, MAC, trivial MR, trivial TR, PASP 36  . Hyperlipidemia   . Hypertension   . Non-STEMI (non-ST  elevated myocardial infarction) ([place])   . Stroke [facility])    "minor" pt reported  . Thrombocytopenia ([place])   . Tobacco abuse     Medications:  Medications Prior to Admission  Medication Sig Dispense Refill Last Dose  . albuterol (VENTOLIN HFA) 108 (90 Base) MCG/ACT inhaler Inhale 2 puffs into the lungs every 6 (six) hours as needed for wheezing or shortness of breath. 18 g 6 05/08/2019 at Unknown time  . aspirin EC 81 MG tablet Take 81 mg by mouth daily.   05/08/2019 at Unknown time  . atorvastatin (LIPITOR) 80 MG tablet Take 1 tablet (80 mg total) by mouth daily at 6 PM. 30 tablet 6 05/08/2019 at Unknown time  . colchicine 0.6 MG tablet Take 1 tablet (0.6 mg total) by mouth daily. 10 tablet 0 05/08/2019 at Unknown time  . cyclobenzaprine (FLEXERIL) 10 MG tablet TAKE 1 TABLET BY MOUTH TWICE DAILY AS NEEDED FOR MUSCLE SPASM (Patient taking differently: Take 10 mg by mouth daily as needed for muscle spasms. ) 30 tablet 3 Past Month at Unknown time  .  fluticasone (FLOVENT HFA) 110 MCG/ACT inhaler Inhale 2 puffs into the lungs 2 (two) times daily. 1 Inhaler 3 05/08/2019 at Unknown time  . metoprolol succinate (TOPROL-XL) 25 MG 24 hr tablet Take 1 tablet (25 mg total) by mouth daily. 30 tablet 6 05/08/2019 at 0930  . Multiple Vitamin (MULTIVITAMIN WITH MINERALS) TABS tablet Take 1 tablet by mouth daily.   05/08/2019 at Unknown time  . nitroGLYCERIN (NITROSTAT) 0.4 MG SL tablet Place 1 tablet (0.4 mg total) under the tongue every 5 (five) minutes as needed for chest pain. 25 tablet 1 unknown  . ticagrelor (BRILINTA) 90 MG TABS tablet Take 1 tablet (90 mg total) by mouth 2 (two) times daily. 60 tablet 11 05/08/2019 at 2300    Assessment: 69 y.o Mcmillan with with a hx of CAD with recent STEMI (04/26/19) successful PCI of the RCA with DES x2, currently on Brilinta post PCI.  He presented back to ED 05/09/19 with abdominal pain. GI consulted, needs ERCP.  Plan for ERCP on Friday 05/13/19. Brilinta needs to be on hold for at  least 5 days.   Per Cardiology, pharmacy consulted to start Aggrastat IV infusion while holding Brilinta, CrCl is >60 ml/min. Will start the Aggrastat infusion when next Brilinta dose would have been due.  Last Brilinta dose given 05/09/19 at 10AM.    On 05/10/19 the SCr increased to 1.32 , CrCl fell <60 ml/min thus Aggrastat adjusted down to 0.075 mcg/kg/min. Hgb 14.5>11.5>11.6 stable and PLTCc  210 k wnl/stable  No bleeding reported, confirmed with RN.  Goal of Therapy:  Monitor platelets by anticoagulation protocol: Yes   Plan:  Increase Aggrastat rate to 0.15 mcg/kg/minute. Monitor CBC daily, monitor for bleeding  Thank you for allowing pharmacy to be part of this patients care team. Nicole Cella, [place] Pharmacist [phone] Please check AMION for all [place] phone numbers After 10:00 PM, call [place] [phone] 05/11/2019,9:29 AM

## 2019-05-11 NOTE — Progress Notes (Signed)
Pharmacy Antibiotic Note  Hunter Mcmillan is a 69 y.o. male admitted on 05/09/2019 with abdominal pain. He is s/p recent STEMI on 04/24/19.  Pharmacy consulted 3/8 for Zosyn dosing for intra-abdominal infection.  Pharmacy consulted on 3/9 to add Vancomycin for sepsis.  On 05/11/19 the SCr improved, down to 0.99, CrCl 70 ml/min  Will adjust vancomycin dose to targe goal AUC 400-550.   Plan: Adjust Vancomycin dose to 1500 mg IV q24h , est AUC 444, used SCr 0.99, Vd 0.72 L/kg.  Goal AUC 400-550. Continue  Zosyn 3.375 g IV q8 hours,  Infuse each dose over 4 hours Monitor clinical status, renal function , culture results daily.  Vanc pk and trough per protocol F/u MRSA PCR   Height: 5\' 11"  (180.3 cm) Weight: 155 lb 3.3 oz (70.4 kg) IBW/kg (Calculated) : 75.3  Temp (24hrs), Avg:98.6 F (37 C), Min:98.2 F (36.8 C), Max:99.1 F (37.3 C)  Recent Labs  Lab 05/09/19 0122 05/09/19 0438 05/10/19 0225 05/11/19 0204  WBC 10.6* 11.5* 9.2 6.8  CREATININE 0.86 0.88 1.32* 0.99    Estimated Creatinine Clearance: 70.1 mL/min (by C-G formula based on SCr of 0.99 mg/dL).    No Known Allergies  Antimicrobials this admission: Zosyn 3/8>> Vancomycin 3/9>>  Dose adjustments this admission: 3/10 vanc adjusted for Scr 0.99 improved  Microbiology results: 3/8 BCx : ngtd 3/8 BCx: ngtd 3/8 COVID: neg  Influenza A/B : neg 3/10 MRSA PCR ordered  Thank you for allowing pharmacy to be a part of this patient's care.  5/8, RPh Clinical Pharmacist  [phone]  Please check AMION for all [facility] Pharmacy phone numbers After 10:00 PM, call Main Pharmacy [phone] 05/11/2019 11:22 AM

## 2019-05-11 NOTE — Consult Note (Signed)
   [facility] CM Inpatient Consult   05/11/2019  Hunter Mcmillan 1950-04-25 [id number]   Referral source:  EMMI red alert General Discharge follow up  Patient screened for high risk score for unplanned readmission score of 25% and for less than 7 days readmission hospitalization, with patient in the [employer] [[employer]] with [employer] [[employer]].  To assess for barriers to care and needs for potential [facility] Care Management services.    Review of patient's medical record form MD hisptory and physical which includes but not limited to and reveals patient is:  HPI from Dr Sydnee Cabal Stan[place]is a 69 y.o.malewith medical history significant ofrecent STEMI April 24, 2019 discharge and was doing well and then was readmitted on 04/30/2019 with symptomatic cholelithiasis. Patient was admitted and medically treated with conservative management and he improved and was discharged on March 2 of 2021 with resolution of his abdominal pain. Patient comes back again, approximately 15 days out from his STEMI with right upper quadrant pain again with associated nausea vomiting. General surgery was called to evaluate the patient. Patient is being referred for admission for again possible acute cholecystitis with symptomatic choledocholithiasis.   Primary Care Provider [PCP]:  Abbe Amsterdam, MD with [facility] is listed as PCP and provides the transition of care follow up  Pharmacy is: [employer] to provider:public and private vehicles  Plan:  Continue to follow progress and disposition to assess for post hospital care management needs.    Please place a [facility] Care Management consult as appropriate and for questions contact:   Charlesetta Shanks, RN BSN CCM [facility]  [phone] business mobile phone Toll free office [phone]  Fax number:  [phone] [email or web address]

## 2019-05-12 ENCOUNTER — Telehealth: Payer: Self-pay | Admitting: Family Medicine

## 2019-05-12 ENCOUNTER — Encounter: Payer: Medicare Other | Admitting: Family Medicine

## 2019-05-12 LAB — COMPREHENSIVE METABOLIC PANEL
ALT: 55 U/L — ABNORMAL HIGH (ref 0–44)
AST: 66 U/L — ABNORMAL HIGH (ref 15–41)
Albumin: 2.3 g/dL — ABNORMAL LOW (ref 3.5–5.0)
Alkaline Phosphatase: 453 U/L — ABNORMAL HIGH (ref 38–126)
Anion gap: 9 (ref 5–15)
BUN: 11 mg/dL (ref 8–23)
CO2: 20 mmol/L — ABNORMAL LOW (ref 22–32)
Calcium: 8.8 mg/dL — ABNORMAL LOW (ref 8.9–10.3)
Chloride: 104 mmol/L (ref 98–111)
Creatinine, Ser: 1.14 mg/dL (ref 0.61–1.24)
GFR calc Af Amer: >60 mL/min (ref >60)
GFR calc non Af Amer: >60 mL/min (ref >60)
Glucose, Bld: 116 mg/dL — ABNORMAL HIGH (ref 70–99)
Potassium: 4 mmol/L (ref 3.5–5.1)
Sodium: 133 mmol/L — ABNORMAL LOW (ref 135–145)
Total Bilirubin: 1.6 mg/dL — ABNORMAL HIGH (ref 0.3–1.2)
Total Protein: 6 g/dL — ABNORMAL LOW (ref 6.5–8.1)

## 2019-05-12 LAB — CBC WITH DIFFERENTIAL/PLATELET
Abs Immature Granulocytes: 0.02 10*3/uL (ref 0.00–0.07)
Basophils Absolute: 0 10*3/uL (ref 0.0–0.1)
Basophils Relative: 0 %
Eosinophils Absolute: 0.1 10*3/uL (ref 0.0–0.5)
Eosinophils Relative: 1 %
HCT: 34.1 % — ABNORMAL LOW (ref 39.0–52.0)
Hemoglobin: 11.8 g/dL — ABNORMAL LOW (ref 13.0–17.0)
Immature Granulocytes: 0 %
Lymphocytes Relative: 24 %
Lymphs Abs: 1.4 10*3/uL (ref 0.7–4.0)
MCH: 32.6 pg (ref 26.0–34.0)
MCHC: 34.6 g/dL (ref 30.0–36.0)
MCV: 94.2 fL (ref 80.0–100.0)
Monocytes Absolute: 0.6 10*3/uL (ref 0.1–1.0)
Monocytes Relative: 10 %
Neutro Abs: 3.5 10*3/uL (ref 1.7–7.7)
Neutrophils Relative %: 65 %
Platelets: 225 10*3/uL (ref 150–400)
RBC: 3.62 MIL/uL — ABNORMAL LOW (ref 4.22–5.81)
RDW: 13.8 % (ref 11.5–15.5)
WBC: 5.6 10*3/uL (ref 4.0–10.5)
nRBC: 0 % (ref 0.0–0.2)

## 2019-05-12 MED ORDER — TIROFIBAN HCL IN NACL 5-0.9 MG/100ML-% IV SOLN
0.1500 ug/kg/min | INTRAVENOUS | Status: DC
  Administered 2019-05-12 (×2): 0.15 ug/kg/min via INTRAVENOUS
  Filled 2019-05-12 (×2): qty 100

## 2019-05-12 NOTE — Plan of Care (Signed)
  Problem: Education: Goal: Knowledge of General Education information will improve Description Including pain rating scale, medication(s)/side effects and non-pharmacologic comfort measures Outcome: Progressing   

## 2019-05-12 NOTE — Anesthesia Preprocedure Evaluation (Addendum)
Anesthesia Evaluation  Patient identified by MRN, date of birth, ID band Patient awake    Reviewed: Allergy & Precautions, H&P , NPO status , Patient's Chart, lab work & pertinent test results  Airway Mallampati: II  TM Distance: >3 FB Neck ROM: Full    Dental no notable dental hx. (+) Poor Dentition, Dental Advisory Given   Pulmonary COPD,  COPD inhaler, Current Smoker and Patient abstained from smoking.,    Pulmonary exam normal breath sounds clear to auscultation       Cardiovascular Exercise Tolerance: Good hypertension, Pt. on medications and Pt. on home beta blockers + CAD, + Past MI and + Cardiac Stents   Rhythm:Regular Rate:Normal     Neuro/Psych Depression CVA, No Residual Symptoms    GI/Hepatic negative GI ROS, Neg liver ROS,   Endo/Other  negative endocrine ROS  Renal/GU negative Renal ROS  negative genitourinary   Musculoskeletal   Abdominal   Peds  Hematology negative hematology ROS (+)   Anesthesia Other Findings   Reproductive/Obstetrics negative OB ROS                            Anesthesia Physical Anesthesia Plan  ASA: III  Anesthesia Plan: General   Post-op Pain Management:    Induction: Intravenous  PONV Risk Score and Plan: 1 and Ondansetron and Dexamethasone  Airway Management Planned: Oral ETT  Additional Equipment:   Intra-op Plan:   Post-operative Plan: Extubation in OR  Informed Consent: I have reviewed the patients History and Physical, chart, labs and discussed the procedure including the risks, benefits and alternatives for the proposed anesthesia with the patient or authorized representative who has indicated his/her understanding and acceptance.     Dental advisory given  Plan Discussed with: CRNA  Anesthesia Plan Comments:         Anesthesia Quick Evaluation

## 2019-05-12 NOTE — Progress Notes (Signed)
PROGRESS NOTE    Hunter Mcmillan  ZSW:[id number] DOB: 11-13-1950 DOA: 05/09/2019 PCP: Pearline Cables, MD    Brief Narrative:  Patient was admitted to the hospital with the working diagnosis of recurrent cholecystitis, symptomatic choledocholithiasis in the setting of recent coronary angioplasty and stent placement.  69 year old male who presented with abdominal pain.  He has significant past medical history for coronary disease status post angioplasty 04/24/19, for non-ST elevation myocardial infarction.  Recent hospitalization for cholelithiasis 04/30/19, medically managed.  At home patient had recurrent right upper quadrant abdominal pain, associated with nausea and vomiting.  Initial physical examination blood pressure 143/78, heart rate 97, respiratory rate 20, oxygen saturation 98%.  Clear to auscultation bilaterally, heart S1-S2 present rhythmic, abdomen positive tenderness the right upper quadrant, no lower extremity edema.   Abdominal ultrasonography showed distended gallbladder with internal stones and sludge, associated with gallbladder wall thickening and positive sonographic Murphy sign, at least one small stone and sludge within the common bile duct which measures up to 6.7 mm  Patient has been placed on supportive medical therapy including IV antibiotic, IV fluids and analgesics. Clopidogrel has been held and patient has been started on tirofiban IV for bridge therapy. Plan for ERCP on 03.12.21.    Assessment & Plan:   Principal Problem:   Cholelithiasis Active Problems:   Hx of non-ST elevation myocardial infarction (NSTEMI)   Cocaine abuse (HCC)   COPD (chronic obstructive pulmonary disease) (HCC)   Essential hypertension   Acute cholecystitis    1. Acute cholecystitis/ choledocholithiasis/ complicated with sepsis, end organ failure hypotension. Today with no abdominal pain, wbc stable at 5,6. Blood pressure 105/69 mmHg. Tolerating po well.   Tolerating well  antibiotic therapy with Zosyn, and plan for ERCP on 03.12. pain control with as needed fentanyl (x1 dose yesterday) and oral oxycodone, dc morphine.    Low suspicion for MRSA will dc IV vancomycin.   2. CAD/ SP 04/24/19 angioplasty and stent placement. Old records personally reviewed patient with severe 2 vessel CAD: 100% proximal RCA and 90% LCx/ PCi to the RCA with thrombectomy and DES x2.   Continue IV tirofiban as bridge therapy for ticagrelor. Carvedilol has been resumed at a lower dose 3,125 mg po bid. Continue with aspirin for now, and atorvastatin.   3. Dyslipidemia. Continue statin therapy.  4. COPD/ No signs of acute exacerbation,continue with inhaled corticosteroid.   5. Tobacco abuse. Smoking cessation counseling.   6. AKI with non gap metabolic acidosis. Renal function with serum cr at 1,14 with a peak at 1,32, today's K is 4,0 and serum bicarbonate at 20, will follow renal panel in am, avoid hypotension and nephrotoxic medications.    DVT prophylaxis: enoxparin   Code Status:  full Family Communication: no family at the bedside  Disposition Plan/ discharge barriers: patient acutely will from sepsis and cholecystitis, no plans for dc now.    Consultants:   GI   Cardiology     Antimicrobials:   Zosyn.    Subjective: Patient is feeling well this am, abdominal pain only to palpation, no nausea or vomiting, no dyspnea or chest pain.   Objective: Vitals:   05/11/19 1946 05/11/19 2021 05/12/19 0500 05/12/19 0506  BP:  109/64  105/69  Pulse:  72  68  Resp:  16  16  Temp:  98.6 F (37 C)  98.4 F (36.9 C)  TempSrc:  Oral  Oral  SpO2: 96% 99%  99%  Weight:   69.9 kg  Height:        Intake/Output Summary (Last 24 hours) at 05/12/2019 1032 Last data filed at 05/12/2019 0745 Gross per 24 hour  Intake 1012.8 ml  Output 2000 ml  Net -987.2 ml   Filed Weights   05/10/19 0500 05/11/19 0500 05/12/19 0500  Weight: 70.7 kg 70.4 kg 69.9 kg     Examination:   General: Not in pain or dyspnea.  Neurology: Awake and alert, non focal  E ENT: no pallor, no icterus, oral mucosa moist Cardiovascular: No JVD. S1-S2 present, rhythmic, no gallops, rubs, or murmurs. No lower extremity edema. Pulmonary: positive breath sounds bilaterally, adequate air movement, no wheezing, rhonchi or rales. Gastrointestinal. Abdomen with no organomegaly, no rebound or guarding. Tender to deep palpation on the right upper quadrant.  Skin. No rashes Musculoskeletal: no joint deformities     Data Reviewed: I have personally reviewed following labs and imaging studies  CBC: Recent Labs  Lab 05/09/19 0122 05/09/19 0438 05/10/19 0225 05/11/19 0204 05/12/19 0316  WBC 10.6* 11.5* 9.2 6.8 5.6  NEUTROABS 8.8*  --  7.6 4.8 3.5  HGB 14.5 14.5 11.5* 11.6* 11.8*  HCT 44.3 44.1 34.0* 33.6* 34.1*  MCV 99.8 99.1 96.9 96.3 94.2  PLT 269 248 197 210 225   Basic Metabolic Panel: Recent Labs  Lab 05/09/19 0122 05/09/19 0438 05/10/19 0225 05/11/19 0204 05/12/19 0316  NA 134* 135 133* 134* 133*  K 4.1 4.4 4.8 4.0 4.0  CL 99 102 99 103 104  CO2 21* 22 24 22  20*  GLUCOSE 192* 123* 105* 125* 116*  BUN 12 10 17 16 11   CREATININE 0.86 0.88 1.32* 0.99 1.14  CALCIUM 9.3 9.2 8.3* 8.5* 8.8*   GFR: Estimated Creatinine Clearance: 60.5 mL/min (by C-G formula based on SCr of 1.14 mg/dL). Liver Function Tests: Recent Labs  Lab 05/09/19 0122 05/09/19 0438 05/10/19 0225 05/11/19 0204 05/12/19 0316  AST 34 25 62* 55* 66*  ALT 48* 42 52* 44 55*  ALKPHOS 158* 156* 227* 262* 453*  BILITOT 1.7* 1.7* 4.2* 3.3* 1.6*  PROT 7.6 7.7 5.8* 5.7* 6.0*  ALBUMIN 3.3* 3.2* 2.3* 2.2* 2.3*   Recent Labs  Lab 05/09/19 0122  LIPASE 18   No results for input(s): AMMONIA in the last 168 hours. Coagulation Profile: No results for input(s): INR, PROTIME in the last 168 hours. Cardiac Enzymes: No results for input(s): CKTOTAL, CKMB, CKMBINDEX, TROPONINI in the last  168 hours. BNP (last 3 results) No results for input(s): PROBNP in the last 8760 hours. HbA1C: No results for input(s): HGBA1C in the last 72 hours. CBG: No results for input(s): GLUCAP in the last 168 hours. Lipid Profile: No results for input(s): CHOL, HDL, LDLCALC, TRIG, CHOLHDL, LDLDIRECT in the last 72 hours. Thyroid Function Tests: No results for input(s): TSH, T4TOTAL, FREET4, T3FREE, THYROIDAB in the last 72 hours. Anemia Panel: No results for input(s): VITAMINB12, FOLATE, FERRITIN, TIBC, IRON, RETICCTPCT in the last 72 hours.    Radiology Studies: I have reviewed all of the imaging during this hospital visit personally     Scheduled Meds: . aspirin EC  81 mg Oral Daily  . atorvastatin  20 mg Oral q1800  . budesonide  0.5 mg Inhalation BID  . carvedilol  3.125 mg Oral BID WC  . colchicine  0.6 mg Oral Daily  . mouth rinse  15 mL Mouth Rinse BID  . multivitamin with minerals  1 tablet Oral Daily   Continuous Infusions: . piperacillin-tazobactam (ZOSYN)  IV  3.375 g (05/12/19 0504)  . tirofiban 0.15 mcg/kg/min (05/12/19 0920)  . vancomycin 1,500 mg (05/12/19 5364)     LOS: 3 days        Mathew Storck Gerome Apley, MD

## 2019-05-12 NOTE — H&P (View-Only) (Signed)
Subjective: Patient was seen and examined at bedside. He has been tolerating low-fat diet with minimal abdominal pain. He has had bowel movements.  Objective: Vital signs in last 24 hours: Temp:  [98.4 F (36.9 C)-99.3 F (37.4 C)] 98.4 F (36.9 C) (03/11 0506) Pulse Rate:  [68-85] 68 (03/11 0506) Resp:  [16-18] 16 (03/11 0506) BP: (105-117)/(64-74) 105/69 (03/11 0506) SpO2:  [96 %-99 %] 99 % (03/11 0506) Weight:  [69.9 kg] 69.9 kg (03/11 0500) Weight change: -0.5 kg Last BM Date: 05/08/19  PE: Not in distress, comfortable GENERAL: No icterus, no pallor ABDOMEN: Soft, nondistended, nontender with normal active bowel sounds EXTREMITIES: no Deformity  Lab Results: Results for orders placed or performed during the hospital encounter of 05/09/19 (from the past 48 hour(s))  Comprehensive metabolic panel     Status: Abnormal   Collection Time: 05/11/19  2:04 AM  Result Value Ref Range   Sodium 134 (L) 135 - 145 mmol/L   Potassium 4.0 3.5 - 5.1 mmol/L   Chloride 103 98 - 111 mmol/L   CO2 22 22 - 32 mmol/L   Glucose, Bld 125 (H) 70 - 99 mg/dL    Comment: Glucose reference range applies only to samples taken after fasting for at least 8 hours.   BUN 16 8 - 23 mg/dL   Creatinine, Ser 2.83 0.61 - 1.24 mg/dL   Calcium 8.5 (L) 8.9 - 10.3 mg/dL   Total Protein 5.7 (L) 6.5 - 8.1 g/dL   Albumin 2.2 (L) 3.5 - 5.0 g/dL   AST 55 (H) 15 - 41 U/L   ALT 44 0 - 44 U/L   Alkaline Phosphatase 262 (H) 38 - 126 U/L   Total Bilirubin 3.3 (H) 0.3 - 1.2 mg/dL   GFR calc non Af Amer >60 >60 mL/min   GFR calc Af Amer >60 >60 mL/min   Anion gap 9 5 - 15    Comment: Performed at [facility], [address], [place], [place] [address]  CBC with Differential/Platelet     Status: Abnormal   Collection Time: 05/11/19  2:04 AM  Result Value Ref Range   WBC 6.8 4.0 - 10.5 K/uL   RBC 3.49 (L) 4.22 - 5.81 MIL/uL   Hemoglobin 11.6 (L) 13.0 - 17.0 g/dL   HCT 16.0 (L) 73.7 - 10.6 %   MCV 96.3 80.0 -  100.0 fL   MCH 33.2 26.0 - 34.0 pg   MCHC 34.5 30.0 - 36.0 g/dL   RDW 26.9 48.5 - 46.2 %   Platelets 210 150 - 400 K/uL   nRBC 0.0 0.0 - 0.2 %   Neutrophils Relative % 71 %   Neutro Abs 4.8 1.7 - 7.7 K/uL   Lymphocytes Relative 17 %   Lymphs Abs 1.1 0.7 - 4.0 K/uL   Monocytes Relative 11 %   Monocytes Absolute 0.8 0.1 - 1.0 K/uL   Eosinophils Relative 1 %   Eosinophils Absolute 0.1 0.0 - 0.5 K/uL   Basophils Relative 0 %   Basophils Absolute 0.0 0.0 - 0.1 K/uL   Immature Granulocytes 0 %   Abs Immature Granulocytes 0.03 0.00 - 0.07 K/uL    Comment: Performed at [facility], [address] [place], [place] [address]  MRSA PCR Screening     Status: None   Collection Time: 05/11/19 11:38 AM   Specimen: Nasal Mucosa; Nasopharyngeal  Result Value Ref Range   MRSA by PCR NEGATIVE NEGATIVE    Comment:  The GeneXpert MRSA Assay (FDA approved for NASAL specimens only), is one component of a comprehensive MRSA colonization surveillance program. It is not intended to diagnose MRSA infection nor to guide or monitor treatment for MRSA infections. Performed at [facility], [address] [place], [place] [address]   Comprehensive metabolic panel     Status: Abnormal   Collection Time: 05/12/19  3:16 AM  Result Value Ref Range   Sodium 133 (L) 135 - 145 mmol/L   Potassium 4.0 3.5 - 5.1 mmol/L   Chloride 104 98 - 111 mmol/L   CO2 20 (L) 22 - 32 mmol/L   Glucose, Bld 116 (H) 70 - 99 mg/dL    Comment: Glucose reference range applies only to samples taken after fasting for at least 8 hours.   BUN 11 8 - 23 mg/dL   Creatinine, Ser 1.14 0.61 - 1.24 mg/dL   Calcium 8.8 (L) 8.9 - 10.3 mg/dL   Total Protein 6.0 (L) 6.5 - 8.1 g/dL   Albumin 2.3 (L) 3.5 - 5.0 g/dL   AST 66 (H) 15 - 41 U/L   ALT 55 (H) 0 - 44 U/L   Alkaline Phosphatase 453 (H) 38 - 126 U/L   Total Bilirubin 1.6 (H) 0.3 - 1.2 mg/dL   GFR calc non Af Amer >60 >60 mL/min   GFR calc Af Amer >60 >60  mL/min   Anion gap 9 5 - 15    Comment: Performed at [facility], [address] [place], [place] [address]  CBC with Differential/Platelet     Status: Abnormal   Collection Time: 05/12/19  3:16 AM  Result Value Ref Range   WBC 5.6 4.0 - 10.5 K/uL   RBC 3.62 (L) 4.22 - 5.81 MIL/uL   Hemoglobin 11.8 (L) 13.0 - 17.0 g/dL   HCT 34.1 (L) 39.0 - 52.0 %   MCV 94.2 80.0 - 100.0 fL   MCH 32.6 26.0 - 34.0 pg   MCHC 34.6 30.0 - 36.0 g/dL   RDW 13.8 11.5 - 15.5 %   Platelets 225 150 - 400 K/uL   nRBC 0.0 0.0 - 0.2 %   Neutrophils Relative % 65 %   Neutro Abs 3.5 1.7 - 7.7 K/uL   Lymphocytes Relative 24 %   Lymphs Abs 1.4 0.7 - 4.0 K/uL   Monocytes Relative 10 %   Monocytes Absolute 0.6 0.1 - 1.0 K/uL   Eosinophils Relative 1 %   Eosinophils Absolute 0.1 0.0 - 0.5 K/uL   Basophils Relative 0 %   Basophils Absolute 0.0 0.0 - 0.1 K/uL   Immature Granulocytes 0 %   Abs Immature Granulocytes 0.02 0.00 - 0.07 K/uL    Comment: Performed at [facility], [address] , [place] [address]    Studies/Results: No results found.  Medications: I have reviewed the patient's current medications.  Assessment: Choledocholithiasis Cholelithiasis with likely cholecystitis, medically managed with IV antibiotics LFTs trending down, T bili 1.6, AST 66, ALT 55, ALP increased to 453  Recent STEMI with placement of drug-eluting stent, was on Brilinta until Monday morning, currently on IV Aggrastat which will be discontinued today evening For ERCP in a.m.   Plan: Patient n.p.o. post midnight IV Aggrastat to be discontinued prior to ERCP Continue IV Zosyn The risks(bleeding, infection, pancreatitis, anesthesia risks and even death) and the benefits of the procedure were discussed with the patient in detail, he understands and verbalizes consent.  Jeaneane Adamec, MD 05/12/2019,   9:32 AM

## 2019-05-12 NOTE — Progress Notes (Signed)
Subjective: Patient was seen and examined at bedside. He has been tolerating low-fat diet with minimal abdominal pain. He has had bowel movements.  Objective: Vital signs in last 24 hours: Temp:  [98.4 F (36.9 C)-99.3 F (37.4 C)] 98.4 F (36.9 C) (03/11 0506) Pulse Rate:  [68-85] 68 (03/11 0506) Resp:  [16-18] 16 (03/11 0506) BP: (105-117)/(64-74) 105/69 (03/11 0506) SpO2:  [96 %-99 %] 99 % (03/11 0506) Weight:  [69.9 kg] 69.9 kg (03/11 0500) Weight change: -0.5 kg Last BM Date: 05/08/19  PE: Not in distress, comfortable GENERAL: No icterus, no pallor ABDOMEN: Soft, nondistended, nontender with normal active bowel sounds EXTREMITIES: no Deformity  Lab Results: Results for orders placed or performed during the hospital encounter of 05/09/19 (from the past 48 hour(s))  Comprehensive metabolic panel     Status: Abnormal   Collection Time: 05/11/19  2:04 AM  Result Value Ref Range   Sodium 134 (L) 135 - 145 mmol/L   Potassium 4.0 3.5 - 5.1 mmol/L   Chloride 103 98 - 111 mmol/L   CO2 22 22 - 32 mmol/L   Glucose, Bld 125 (H) 70 - 99 mg/dL    Comment: Glucose reference range applies only to samples taken after fasting for at least 8 hours.   BUN 16 8 - 23 mg/dL   Creatinine, Ser 2.83 0.61 - 1.24 mg/dL   Calcium 8.5 (L) 8.9 - 10.3 mg/dL   Total Protein 5.7 (L) 6.5 - 8.1 g/dL   Albumin 2.2 (L) 3.5 - 5.0 g/dL   AST 55 (H) 15 - 41 U/L   ALT 44 0 - 44 U/L   Alkaline Phosphatase 262 (H) 38 - 126 U/L   Total Bilirubin 3.3 (H) 0.3 - 1.2 mg/dL   GFR calc non Af Amer >60 >60 mL/min   GFR calc Af Amer >60 >60 mL/min   Anion gap 9 5 - 15    Comment: Performed at [facility], [address], [place], [place] [address]  CBC with Differential/Platelet     Status: Abnormal   Collection Time: 05/11/19  2:04 AM  Result Value Ref Range   WBC 6.8 4.0 - 10.5 K/uL   RBC 3.49 (L) 4.22 - 5.81 MIL/uL   Hemoglobin 11.6 (L) 13.0 - 17.0 g/dL   HCT 16.0 (L) 73.7 - 10.6 %   MCV 96.3 80.0 -  100.0 fL   MCH 33.2 26.0 - 34.0 pg   MCHC 34.5 30.0 - 36.0 g/dL   RDW 26.9 48.5 - 46.2 %   Platelets 210 150 - 400 K/uL   nRBC 0.0 0.0 - 0.2 %   Neutrophils Relative % 71 %   Neutro Abs 4.8 1.7 - 7.7 K/uL   Lymphocytes Relative 17 %   Lymphs Abs 1.1 0.7 - 4.0 K/uL   Monocytes Relative 11 %   Monocytes Absolute 0.8 0.1 - 1.0 K/uL   Eosinophils Relative 1 %   Eosinophils Absolute 0.1 0.0 - 0.5 K/uL   Basophils Relative 0 %   Basophils Absolute 0.0 0.0 - 0.1 K/uL   Immature Granulocytes 0 %   Abs Immature Granulocytes 0.03 0.00 - 0.07 K/uL    Comment: Performed at [facility], [address] [place], [place] [address]  MRSA PCR Screening     Status: None   Collection Time: 05/11/19 11:38 AM   Specimen: Nasal Mucosa; Nasopharyngeal  Result Value Ref Range   MRSA by PCR NEGATIVE NEGATIVE    Comment:  The GeneXpert MRSA Assay (FDA approved for NASAL specimens only), is one component of a comprehensive MRSA colonization surveillance program. It is not intended to diagnose MRSA infection nor to guide or monitor treatment for MRSA infections. Performed at [facility], [place] [address], [place], [place] [address]   Comprehensive metabolic panel     Status: Abnormal   Collection Time: 05/12/19  3:16 AM  Result Value Ref Range   Sodium 133 (L) 135 - 145 mmol/L   Potassium 4.0 3.5 - 5.1 mmol/L   Chloride 104 98 - 111 mmol/L   CO2 20 (L) 22 - 32 mmol/L   Glucose, Bld 116 (H) 70 - 99 mg/dL    Comment: Glucose reference range applies only to samples taken after fasting for at least 8 hours.   BUN 11 8 - 23 mg/dL   Creatinine, Ser 1.14 0.61 - 1.24 mg/dL   Calcium 8.8 (L) 8.9 - 10.3 mg/dL   Total Protein 6.0 (L) 6.5 - 8.1 g/dL   Albumin 2.3 (L) 3.5 - 5.0 g/dL   AST 66 (H) 15 - 41 U/L   ALT 55 (H) 0 - 44 U/L   Alkaline Phosphatase 453 (H) 38 - 126 U/L   Total Bilirubin 1.6 (H) 0.3 - 1.2 mg/dL   GFR calc non Af Amer >60 >60 mL/min   GFR calc Af Amer >60 >60  mL/min   Anion gap 9 5 - 15    Comment: Performed at [facility], [place] [address] [place], [place] [address]  CBC with Differential/Platelet     Status: Abnormal   Collection Time: 05/12/19  3:16 AM  Result Value Ref Range   WBC 5.6 4.0 - 10.5 K/uL   RBC 3.62 (L) 4.22 - 5.81 MIL/uL   Hemoglobin 11.8 (L) 13.0 - 17.0 g/dL   HCT 34.1 (L) 39.0 - 52.0 %   MCV 94.2 80.0 - 100.0 fL   MCH 32.6 26.0 - 34.0 pg   MCHC 34.6 30.0 - 36.0 g/dL   RDW 13.8 11.5 - 15.5 %   Platelets 225 150 - 400 K/uL   nRBC 0.0 0.0 - 0.2 %   Neutrophils Relative % 65 %   Neutro Abs 3.5 1.7 - 7.7 K/uL   Lymphocytes Relative 24 %   Lymphs Abs 1.4 0.7 - 4.0 K/uL   Monocytes Relative 10 %   Monocytes Absolute 0.6 0.1 - 1.0 K/uL   Eosinophils Relative 1 %   Eosinophils Absolute 0.1 0.0 - 0.5 K/uL   Basophils Relative 0 %   Basophils Absolute 0.0 0.0 - 0.1 K/uL   Immature Granulocytes 0 %   Abs Immature Granulocytes 0.02 0.00 - 0.07 K/uL    Comment: Performed at [facility] [address], [place], [place] [address]    Studies/Results: No results found.  Medications: I have reviewed the patient's current medications.  Assessment: Choledocholithiasis Cholelithiasis with likely cholecystitis, medically managed with IV antibiotics LFTs trending down, T bili 1.6, AST 66, ALT 55, ALP increased to 453  Recent STEMI with placement of drug-eluting stent, was on Brilinta until Monday morning, currently on IV Aggrastat which will be discontinued today evening For ERCP in a.m.   Plan: Patient n.p.o. post midnight IV Aggrastat to be discontinued prior to ERCP Continue IV Zosyn The risks(bleeding, infection, pancreatitis, anesthesia risks and even death) and the benefits of the procedure were discussed with the patient in detail, he understands and verbalizes consent.  Ronnette Juniper, MD 05/12/2019,  9:32 AM

## 2019-05-12 NOTE — Progress Notes (Signed)
Progress Note  Patient Name: Hunter Mcmillan Date of Encounter: 05/12/2019  Primary Cardiologist: Dr. Martinique  Subjective   No recurrent chest pain.   Inpatient Medications    Scheduled Meds: . aspirin EC  81 mg Oral Daily  . atorvastatin  20 mg Oral q1800  . budesonide  0.5 mg Inhalation BID  . carvedilol  3.125 mg Oral BID WC  . colchicine  0.6 mg Oral Daily  . mouth rinse  15 mL Mouth Rinse BID  . multivitamin with minerals  1 tablet Oral Daily   Continuous Infusions: . piperacillin-tazobactam (ZOSYN)  IV 3.375 g (05/12/19 0504)  . tirofiban 0.15 mcg/kg/min (05/12/19 0920)  . vancomycin 1,500 mg (05/12/19 0918)   PRN Meds: acetaminophen, albuterol, cyclobenzaprine, fentaNYL (SUBLIMAZE) injection, morphine injection, nitroGLYCERIN, ondansetron **OR** ondansetron (ZOFRAN) IV, oxyCODONE   Vital Signs    Vitals:   05/11/19 1946 05/11/19 2021 05/12/19 0500 05/12/19 0506  BP:  109/64  105/69  Pulse:  72  68  Resp:  16  16  Temp:  98.6 F (37 C)  98.4 F (36.9 C)  TempSrc:  Oral  Oral  SpO2: 96% 99%  99%  Weight:   69.9 kg   Height:        Intake/Output Summary (Last 24 hours) at 05/12/2019 1114 Last data filed at 05/12/2019 0745 Gross per 24 hour  Intake 1012.8 ml  Output 2000 ml  Net -987.2 ml    I/O since admission: +3428  Filed Weights   05/10/19 0500 05/11/19 0500 05/12/19 0500  Weight: 70.7 kg 70.4 kg 69.9 kg    Telemetry    Sinus rhythm, no ectopy on low-dose carvedilol heart rate in the 70s- Personally Reviewed  ECG    05/10/2019 ECG (independently read by me): Normal sinus rhythm at 86 bpm.  Inferior Q waves with T wave inversion V2 through V6.  QTc interval 447 ms  05/09/2019 ECG (independently read by me): Normal sinus rhythm at 98 bpm.  Q waves 2 3 and F V5 and V6 with T wave inversion system with recent inferolateral myocardial infarction  Physical Exam   BP 105/69 (BP Location: Right Arm)   Pulse 68   Temp 98.4 F (36.9 C) (Oral)    Resp 16   Ht 5\' 11"  (1.803 m)   Wt 69.9 kg   SpO2 99%   BMI 21.49 kg/m  General: Alert, oriented, no distress.  Skin: normal turgor, no rashes, warm and dry HEENT: Normocephalic, atraumatic. Pupils equal round and reactive to light; sclera anicteric; extraocular muscles intact;  Nose without nasal septal hypertrophy Mouth/Parynx benign;  Neck: No JVD, no carotid bruits; normal carotid upstroke Lungs: clear to ausculatation and percussion; no wheezing or rales Chest wall: without tenderness to palpitation Heart: PMI not displaced, RRR, s1 s2 normal, 1/6 systolic murmur, no diastolic murmur, no rubs, gallops, thrills, or heaves Abdomen: Resolution of right upper quadrant pain;  no hepatosplenomehaly, BS+; abdominal aorta nontender and not dilated by palpation. Back: no CVA tenderness Pulses 2+ Musculoskeletal: full range of motion, normal strength, no joint deformities Extremities: no clubbing cyanosis or edema, Homan's sign negative  Neurologic: grossly nonfocal; Cranial nerves grossly wnl Psychologic: Normal mood and affect   Labs    Chemistry Recent Labs  Lab 05/10/19 0225 05/11/19 0204 05/12/19 0316  NA 133* 134* 133*  K 4.8 4.0 4.0  CL 99 103 104  CO2 24 22 20*  GLUCOSE 105* 125* 116*  BUN 17 16 11   CREATININE 1.32*  0.99 1.14  CALCIUM 8.3* 8.5* 8.8*  PROT 5.8* 5.7* 6.0*  ALBUMIN 2.3* 2.2* 2.3*  AST 62* 55* 66*  ALT 52* 44 55*  ALKPHOS 227* 262* 453*  BILITOT 4.2* 3.3* 1.6*  GFRNONAA 55* >60 >60  GFRAA >60 >60 >60  ANIONGAP 10 9 9      Hematology Recent Labs  Lab 05/10/19 0225 05/11/19 0204 05/12/19 0316  WBC 9.2 6.8 5.6  RBC 3.51* 3.49* 3.62*  HGB 11.5* 11.6* 11.8*  HCT 34.0* 33.6* 34.1*  MCV 96.9 96.3 94.2  MCH 32.8 33.2 32.6  MCHC 33.8 34.5 34.6  RDW 14.0 14.2 13.8  PLT 197 210 225    Cardiac EnzymesNo results for input(s): TROPONINI in the last 168 hours. No results for input(s): TROPIPOC in the last 168 hours.   BNPNo results for input(s):  BNP, PROBNP in the last 168 hours.   DDimer No results for input(s): DDIMER in the last 168 hours.   Lipid Panel     Component Value Date/Time   CHOL 94 04/26/2019 0314   TRIG 63 04/26/2019 0314   HDL 16 (L) 04/26/2019 0314   CHOLHDL 5.9 04/26/2019 0314   VLDL 13 04/26/2019 0314   LDLCALC 65 04/26/2019 0314     Radiology    No results found.  Cardiac Studies   Echo: 04/24/19   IMPRESSIONS  1. Left ventricular ejection fraction, by estimation, is 40 to 45%. The  left ventricle has mildly decreased function. The left ventricle  demonstrates regional wall motion abnormalities (see scoring  diagram/findings for description). There is mild left  ventricular hypertrophy. Left ventricular diastolic parameters are  consistent with Grade I diastolic dysfunction (impaired relaxation).  2. Right ventricular systolic function is severely reduced. The right  ventricular size is mildly enlarged. There is mildly elevated pulmonary  artery systolic pressure.  3. The mitral valve is grossly normal. Trivial mitral valve  regurgitation.  4. Tricuspid valve regurgitation is mild to moderate.  5. The aortic valve has an indeterminant number of cusps. Aortic valve  regurgitation is not visualized.  6. The inferior vena cava is dilated in size with <50% respiratory  variability, suggesting right atrial pressure of 15 mmHg.   Cath: 04/24/19   Ost LM lesion is 25% stenosed.  Mid LAD lesion is 25% stenosed.  Prox Cx lesion is 90% stenosed.  Ost RCA to Mid RCA lesion is 100% stenosed.  A drug-eluting stent was successfully placed using a STENT RESOLUTE ONYX 3.5X38.  A drug-eluting stent was successfully placed using a STENT RESOLUTE ONYX 3.5X30.  Post intervention, there is a 0% residual stenosis.  LV end diastolic pressure is normal.  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 Severe 2 vessel CAD - culprit lesion is 100% occlusion of the proximal RCA with heavy  thrombus - 90% small LCx 3. Normal LVEDP 4. Successful PCI of the RCA with thrombectomy and stenting with DES x 2.  Plan: DAPT for at least one year. IV Aggrastat for 18 hours. Start beta blocker and high dose statin. Assess LV function by Echo.     Diagnostic Dominance: Right  Intervention     Patient Profile     Kainoa Swoboda is a 69 y.o. male with a hx of CAD with recent STEMI (04/26/19) successful PCI of the RCA with DES x2, HTN, HL, Stroke, ETOH, HVC/ETOH, COPD and tobacco use who is being seen today for the evaluation of management of DAPT in the setting of cholelithiasis with  distended gallbladder and stones and sludge in common bile duct in need for ERCP on 05/13/2019.   Assessment & Plan    1.  CAD, recent NSTEMI April 24, 2019: Status post DES stenting x2 to RCA along thrombectomy for extensive thrombus with insertion of Resolute 3.5x38 and 3.5x30 DES stents.  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  No recurrent chest pain.  2.  Antiplatelet therapy: Patient needs to be on DAPT for minimum of 1 year following his ACS.  In anticipation of planned ERCP on Friday, last dose of Brilinta was March 8 in a.m.  He was started on Aggrastat for glycoprotein 2b3a inhibition the evening of March 8, with dosing per pharmacy.  Will need to hold for at least 6 hours prior to the planned procedure time.  3.  PVCs with transient ventricular trigeminy reported by central monitoring; BP has been low.  Will attempt to add very low-dose beta-blocker therapy with carvedilol 3.125 mg twice a day.   4.  Cholelithiasis and choledocholithiasis with distended gallbladder, common bile duct stone with sludge.  Abdominal discomfort improved  5.  Elevated LFTs: Trending down with bilirubin 4.2 on 3 9-1.6 today.  AST 66/ALT 55.  6.  Hyperlipidemia with target LDL less than  70.  Atorvastatin dosing was reduced to 40 mg from 80 mg due to LFT elevation.  Ultimately once LFTs normalize most likely can do to at higher dose if LDL is still not at target less than 70.  7.  Tobacco abuse: Last cigarette was just prior to admission..  I discussed the importance of complete smoking cessation.  Signed, Lennette Bihari, MD, [facility] 05/12/2019, 11:14 AM

## 2019-05-12 NOTE — Progress Notes (Signed)
Pharmacy Consult Note -  Follow up Pharmacy Consult for Aggrastat infusion  Indication: recent stent No Known Allergies  Patient Measurements: Height: _0  (180.3 cm) Weight: 154 lb 1.6 oz (69.9 kg) IBW/kg (Calculated) : 75.3   Vital Signs: Temp: 98.4 F (36.9 C) (03/11 0506) Temp Source: Oral (03/11 0506) BP: 105/69 (03/11 0506) Pulse Rate: 68 (03/11 0506)  Labs: Recent Labs    05/10/19 0225 05/10/19 0225 05/11/19 0204 05/12/19 0316  HGB 11.5*   < > 11.6* 11.8*  HCT 34.0*  --  33.6* 34.1*  PLT 197  --  210 225  CREATININE 1.32*  --  0.99 1.14   < > = values in this interval not displayed.    Estimated Creatinine Clearance: 60.5 mL/min (by C-G formula based on SCr of 1.14 mg/dL).   Medical History: Past Medical History:  Diagnosis Date  . Abdominal pain 05/02/2019  . Alcoholism ([place])   . CAD (coronary artery disease)    a. 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 Chicken pox   . Depression   . ETOH abuse   . Genital warts   . Hepatic steatosis   . Hepatitis C    a. s/p [name] ~ 2003 in [place], [place] => stopped after 6 mos due to neutropenia - viral load noted to be low at that time;  no follow up since;   b. abdominal u/s 11/13:  diffuse hepatic steatosis and/or hepatocellular disease    . Hx of echocardiogram    a. Echo 01/06/12: Mild focal basal hypertrophy the septum, vigorous LV, EF 65-70%, Gr 2 diast dysfn, trivial AI, MAC, trivial MR, trivial TR, PASP 36  . Hyperlipidemia   . Hypertension   . Non-STEMI (non-ST elevated myocardial infarction) ([place])   . Stroke [facility])    "minor" pt reported  . Thrombocytopenia ([place])   . Tobacco abuse      Medications:  Medications Prior to Admission  Medication Sig Dispense Refill Last Dose  . albuterol (VENTOLIN HFA) 108 (90 Base) MCG/ACT inhaler Inhale 2 puffs into the lungs every 6 (six) hours as needed for wheezing or shortness of breath. 18 g 6 05/08/2019 at Unknown time  . aspirin EC 81 MG tablet Take 81 mg by mouth daily.   05/08/2019 at Unknown time  . atorvastatin (LIPITOR) 80 MG tablet Take 1 tablet (80 mg total) by mouth daily at 6 PM. 30 tablet 6 05/08/2019 at Unknown time  . colchicine 0.6 MG tablet Take 1 tablet (0.6 mg total) by mouth daily. 10 tablet 0 05/08/2019 at Unknown time  . cyclobenzaprine (FLEXERIL) 10 MG tablet TAKE 1 TABLET BY MOUTH TWICE DAILY AS NEEDED FOR MUSCLE SPASM (Patient taking differently: Take 10 mg by mouth daily as needed for muscle spasms. ) 30 tablet 3 Past Month at Unknown time  . fluticasone (FLOVENT HFA) 110 MCG/ACT inhaler Inhale 2 puffs into the lungs 2 (two) times daily. 1 Inhaler 3 05/08/2019 at Unknown time  . metoprolol succinate (TOPROL-XL) 25 MG 24 hr tablet Take 1 tablet (25 mg total) by mouth daily. 30 tablet 6 05/08/2019 at 0930  . Multiple Vitamin (MULTIVITAMIN WITH MINERALS) TABS  tablet Take 1 tablet by mouth daily.   05/08/2019 at Unknown time  . nitroGLYCERIN (NITROSTAT) 0.4 MG SL tablet Place 1 tablet (0.4 mg total) under the tongue every 5 (five) minutes as needed for chest pain. 25 tablet 1 unknown  . ticagrelor (BRILINTA) 90 MG TABS tablet Take 1 tablet (90 mg total) by mouth 2 (two) times daily. 60 tablet 11 05/08/2019 at 2300    Assessment: 69 y.o male with with a hx of CAD with recent STEMI (04/26/19) successful PCI of the RCA with DES x2, currently on Brilinta post PCI.  He presented back to ED 05/09/19 with abdominal pain. GI consulted, needs ERCP.  Plan for ERCP on Friday 05/13/19.   ERCP planned on 3/12 at 9:30am. Will hold 6 hours prior to procedure  Goal of Therapy:  Monitor platelets by anticoagulation protocol: Yes   Plan:  Stop  Aggrastat at 1:30am on 3/12 for ERCP 3/12 Anticipate resuming Brilinta post procedure   Hildred Laser, PharmD Clinical Pharmacist **Pharmacist phone directory can now be found on [email or web address] ([email or web address]).  Listed under [place].

## 2019-05-13 ENCOUNTER — Inpatient Hospital Stay (HOSPITAL_COMMUNITY): Payer: Medicare Other | Admitting: Anesthesiology

## 2019-05-13 ENCOUNTER — Encounter (HOSPITAL_COMMUNITY)
Admission: EM | Disposition: A | Discharge status: Home / Self Care | Payer: Self-pay | Source: Home / Self Care | Attending: Internal Medicine

## 2019-05-13 ENCOUNTER — Encounter (HOSPITAL_COMMUNITY): Payer: Self-pay | Admitting: Family Medicine

## 2019-05-13 ENCOUNTER — Inpatient Hospital Stay (HOSPITAL_COMMUNITY): Payer: Medicare Other

## 2019-05-13 HISTORY — PX: REMOVAL OF STONES: SHX5545

## 2019-05-13 HISTORY — PX: ERCP: SHX5425

## 2019-05-13 HISTORY — PX: SPHINCTEROTOMY: SHX5544

## 2019-05-13 LAB — CBC WITH DIFFERENTIAL/PLATELET
Abs Immature Granulocytes: 0.03 10*3/uL (ref 0.00–0.07)
Basophils Absolute: 0 10*3/uL (ref 0.0–0.1)
Basophils Relative: 1 %
Eosinophils Absolute: 0.1 10*3/uL (ref 0.0–0.5)
Eosinophils Relative: 2 %
HCT: 34.3 % — ABNORMAL LOW (ref 39.0–52.0)
Hemoglobin: 11.9 g/dL — ABNORMAL LOW (ref 13.0–17.0)
Immature Granulocytes: 1 %
Lymphocytes Relative: 29 %
Lymphs Abs: 1.6 10*3/uL (ref 0.7–4.0)
MCH: 32.4 pg (ref 26.0–34.0)
MCHC: 34.7 g/dL (ref 30.0–36.0)
MCV: 93.5 fL (ref 80.0–100.0)
Monocytes Absolute: 0.7 10*3/uL (ref 0.1–1.0)
Monocytes Relative: 12 %
Neutro Abs: 3.1 10*3/uL (ref 1.7–7.7)
Neutrophils Relative %: 55 %
Platelets: 233 10*3/uL (ref 150–400)
RBC: 3.67 MIL/uL — ABNORMAL LOW (ref 4.22–5.81)
RDW: 14 % (ref 11.5–15.5)
WBC: 5.4 10*3/uL (ref 4.0–10.5)
nRBC: 0 % (ref 0.0–0.2)

## 2019-05-13 LAB — COMPREHENSIVE METABOLIC PANEL
ALT: 46 U/L — ABNORMAL HIGH (ref 0–44)
AST: 40 U/L (ref 15–41)
Albumin: 2.4 g/dL — ABNORMAL LOW (ref 3.5–5.0)
Alkaline Phosphatase: 388 U/L — ABNORMAL HIGH (ref 38–126)
Anion gap: 10 (ref 5–15)
BUN: 10 mg/dL (ref 8–23)
CO2: 23 mmol/L (ref 22–32)
Calcium: 9 mg/dL (ref 8.9–10.3)
Chloride: 103 mmol/L (ref 98–111)
Creatinine, Ser: 0.93 mg/dL (ref 0.61–1.24)
GFR calc Af Amer: >60 mL/min (ref >60)
GFR calc non Af Amer: >60 mL/min (ref >60)
Glucose, Bld: 96 mg/dL (ref 70–99)
Potassium: 4.1 mmol/L (ref 3.5–5.1)
Sodium: 136 mmol/L (ref 135–145)
Total Bilirubin: 1.1 mg/dL (ref 0.3–1.2)
Total Protein: 6.2 g/dL — ABNORMAL LOW (ref 6.5–8.1)

## 2019-05-13 SURGERY — ERCP, WITH INTERVENTION IF INDICATED
Anesthesia: General

## 2019-05-13 MED ORDER — INDOMETHACIN 50 MG RE SUPP
RECTAL | Status: DC | PRN
  Administered 2019-05-13: 100 mg via RECTAL

## 2019-05-13 MED ORDER — PANTOPRAZOLE SODIUM 40 MG PO TBEC
40.0000 mg | DELAYED_RELEASE_TABLET | Freq: Every day | ORAL | Status: DC
  Administered 2019-05-13: 40 mg via ORAL
  Filled 2019-05-13: qty 1

## 2019-05-13 MED ORDER — TICAGRELOR 90 MG PO TABS
90.0000 mg | ORAL_TABLET | Freq: Two times a day (BID) | ORAL | Status: DC
  Administered 2019-05-13: 90 mg via ORAL
  Filled 2019-05-13 (×2): qty 1

## 2019-05-13 MED ORDER — PROPOFOL 10 MG/ML IV BOLUS
INTRAVENOUS | Status: DC | PRN
  Administered 2019-05-13: 80 mg via INTRAVENOUS

## 2019-05-13 MED ORDER — EPHEDRINE SULFATE-NACL 50-0.9 MG/10ML-% IV SOSY
PREFILLED_SYRINGE | INTRAVENOUS | Status: DC | PRN
  Administered 2019-05-13: 10 mg via INTRAVENOUS

## 2019-05-13 MED ORDER — SODIUM CHLORIDE 0.9 % IV SOLN
INTRAVENOUS | Status: DC | PRN
  Administered 2019-05-13: 09:00:00 20 mL

## 2019-05-13 MED ORDER — DEXAMETHASONE SODIUM PHOSPHATE 10 MG/ML IJ SOLN
INTRAMUSCULAR | Status: DC | PRN
  Administered 2019-05-13: 5 mg via INTRAVENOUS

## 2019-05-13 MED ORDER — ROCURONIUM BROMIDE 10 MG/ML (PF) SYRINGE
PREFILLED_SYRINGE | INTRAVENOUS | Status: DC | PRN
  Administered 2019-05-13: 50 mg via INTRAVENOUS

## 2019-05-13 MED ORDER — SODIUM CHLORIDE 0.9 % IV SOLN: INTRAVENOUS | Status: DC

## 2019-05-13 MED ORDER — SUGAMMADEX SODIUM 200 MG/2ML IV SOLN
INTRAVENOUS | Status: DC | PRN
  Administered 2019-05-13 (×2): 100 mg via INTRAVENOUS

## 2019-05-13 MED ORDER — ONDANSETRON HCL 4 MG/2ML IJ SOLN
INTRAMUSCULAR | Status: DC | PRN
  Administered 2019-05-13: 4 mg via INTRAVENOUS

## 2019-05-13 MED ORDER — LIDOCAINE 2% (20 MG/ML) 5 ML SYRINGE
INTRAMUSCULAR | Status: DC | PRN
  Administered 2019-05-13: 60 mg via INTRAVENOUS

## 2019-05-13 MED ORDER — GLUCAGON HCL RDNA (DIAGNOSTIC) 1 MG IJ SOLR
INTRAMUSCULAR | Status: AC
  Filled 2019-05-13: qty 1

## 2019-05-13 MED ORDER — FENTANYL CITRATE (PF) 250 MCG/5ML IJ SOLN
INTRAMUSCULAR | Status: DC | PRN
  Administered 2019-05-13: 100 ug via INTRAVENOUS

## 2019-05-13 MED ORDER — LACTATED RINGERS IV SOLN: INTRAVENOUS | Status: DC

## 2019-05-13 MED ORDER — CARVEDILOL 3.125 MG PO TABS: 3.1250 mg | ORAL_TABLET | Freq: Two times a day (BID) | ORAL | 0 refills | Status: DC

## 2019-05-13 MED ORDER — PHENYLEPHRINE 40 MCG/ML (10ML) SYRINGE FOR IV PUSH (FOR BLOOD PRESSURE SUPPORT)
PREFILLED_SYRINGE | INTRAVENOUS | Status: DC | PRN
  Administered 2019-05-13: 120 ug via INTRAVENOUS
  Administered 2019-05-13: 80 ug via INTRAVENOUS
  Administered 2019-05-13: 120 ug via INTRAVENOUS

## 2019-05-13 MED ORDER — ACETAMINOPHEN 500 MG PO TABS: 500.0000 mg | ORAL_TABLET | Freq: Four times a day (QID) | ORAL | 0 refills | Status: AC | PRN

## 2019-05-13 MED FILL — CARVEDILOL 3.125 MG TABLET: 3.125 | 30 days supply | Qty: 60 | Fill #0

## 2019-05-13 MED FILL — ACETAMINOPHEN 500MG XT STRE: 500 | 5 days supply | Qty: 20 | Fill #0

## 2019-05-13 NOTE — Progress Notes (Signed)
Patient had 15 beat of wide QRS. Asymptomatic Dr. Winferd Humphrey notified.

## 2019-05-13 NOTE — Brief Op Note (Signed)
05/09/2019 - 05/13/2019  9:48 AM  PATIENT:  Hunter Mcmillan  69 y.o. male  PRE-OPERATIVE DIAGNOSIS:  CBD stone, brilinta on hold from Monday evening, bridging with aggrastat till Thrusday evening  POST-OPERATIVE DIAGNOSIS:  sphincterotomy, balloon sweep, nothing in duct  PROCEDURE:  Procedure(s): ENDOSCOPIC RETROGRADE CHOLANGIOPANCREATOGRAPHY (ERCP) (N/A) SPHINCTEROTOMY REMOVAL OF STONES  SURGEON:  Surgeon(s) and Role:    Ronnette Juniper, MD - Primary  PHYSICIAN ASSISTANT:   ASSISTANTS: Grace Isaac, RN, Elspeth Cho, Tech   ANESTHESIA:   MAC  EBL:  0 mL   BLOOD ADMINISTERED:none  DRAINS: none   LOCAL MEDICATIONS USED:  NONE  SPECIMEN:  No Specimen  DISPOSITION OF SPECIMEN:  NA  COUNTS:  YES  TOURNIQUET:  * No tourniquets in log *  DICTATION: .Dragon Dictation  PLAN OF CARE: Admit to inpatient   PATIENT DISPOSITION:  PACU - hemodynamically stable.   Delay start of Pharmacological VTE agent (>24hrs) due to surgical blood loss or risk of bleeding: no

## 2019-05-13 NOTE — Interval H&P Note (Signed)
History and Physical Interval Note: 68/male with  choledocholithiasis, Brilinta on hold from 05/08/19, Aggrastat IV stopped at 2 am for an ERCP.  05/13/2019 8:47 AM  Hunter Mcmillan  has presented today for ERCP, with the diagnosis of CBD stone, brilinta on hold from Monday evening, bridging with aggrastat till Thrusday evening.  The various methods of treatment have been discussed with the patient and family. After consideration of risks, benefits and other options for treatment, the patient has consented to  Procedure(s): ENDOSCOPIC RETROGRADE CHOLANGIOPANCREATOGRAPHY (ERCP) (N/A) as a surgical intervention.  The patient's history has been reviewed, patient examined, no change in status, stable for surgery.  I have reviewed the patient's chart and labs.  Questions were answered to the patient's satisfaction.     Kerin Salen

## 2019-05-13 NOTE — Progress Notes (Addendum)
Patient with resolution in abdominal pain and nausea. Undergoing ERCP today for choledocholithiasis, ideally patient would have sphincterotomy with this for symptomatic relief so that he is hopefully able to pass any stones in the future. Patient with recent NSTEMI 04/24/19. Cardiology recommending continuing Brillinta for at least 1 year. He would need to be off of this for about 7 days total surrounding a laparoscopic cholecystectomy. He does not currently have cholecystitis so I do not think his gallbladder needs to be removed urgently. He can follow up in surgical office when more clear from a cardiology standpoint to be off antiplatelet therapy. I will ensure [facility] contact information is in patient's chart. General surgery will sign off at this time.   Wells Guiles , [facility] Surgery 05/13/2019, 8:31 AM Please see Amion for pager number during day hours 7:00am-4:30pm

## 2019-05-13 NOTE — Progress Notes (Addendum)
Progress Note  Patient Name: Hunter Mcmillan Date of Encounter: 05/13/2019  Primary Cardiologist: Dr. [place]  Subjective   Back from ERCP this morning.  Inpatient Medications    Scheduled Meds: . aspirin EC  81 mg Oral Daily  . atorvastatin  20 mg Oral q1800  . budesonide  0.5 mg Inhalation BID  . carvedilol  3.125 mg Oral BID WC  . colchicine  0.6 mg Oral Daily  . mouth rinse  15 mL Mouth Rinse BID  . multivitamin with minerals  1 tablet Oral Daily  . pantoprazole  40 mg Oral Daily  . ticagrelor  90 mg Oral BID   Continuous Infusions:  PRN Meds: acetaminophen, albuterol, cyclobenzaprine, fentaNYL (SUBLIMAZE) injection, nitroGLYCERIN, ondansetron **OR** ondansetron (ZOFRAN) IV, oxyCODONE   Vital Signs    Vitals:   05/13/19 1010 05/13/19 1041 05/13/19 1200 05/13/19 1429  BP: 115/65 118/78  107/69  Pulse: 72 79  73  Resp: 18 20 16 18   Temp:  97.9 F (36.6 C)  97.9 F (36.6 C)  TempSrc:  Oral  Oral  SpO2: 98% 100%  98%  Weight:      Height:        Intake/Output Summary (Last 24 hours) at 05/13/2019 1447 Last data filed at 05/13/2019 0933 Gross per 24 hour  Intake 400 ml  Output 1490 ml  Net -1090 ml    I/O since admission: +3428  Filed Weights   05/11/19 0500 05/12/19 0500 05/13/19 0500  Weight: 70.4 kg 69.9 kg 70 kg    Telemetry    Sinus rhythm, no ectopy on low-dose carvedilol heart rate in the 70s- Personally Reviewed  ECG    05/10/2019 ECG (independently read by me): Normal sinus rhythm at 86 bpm.  Inferior Q waves with T wave inversion V2 through V6.  QTc interval 447 ms  05/09/2019 ECG (independently read by me): Normal sinus rhythm at 98 bpm.  Q waves 2 3 and F V5 and V6 with T wave inversion system with recent inferolateral myocardial infarction  Physical Exam   BP 107/69 (BP Location: Right Arm)   Pulse 73   Temp 97.9 F (36.6 C) (Oral)   Resp 18   Ht 5\' 11"  (1.803 m)   Wt 70 kg   SpO2 98%   BMI 21.52 kg/m  General: Alert,  oriented, no distress.  Skin: normal turgor, no rashes, warm and dry HEENT: Normocephalic, atraumatic. Pupils equal round and reactive to light; sclera anicteric; extraocular muscles intact;  Nose without nasal septal hypertrophy Mouth/Parynx benign;  Neck: No JVD, no carotid bruits; normal carotid upstroke Lungs: clear to ausculatation and percussion; no wheezing or rales Chest wall: without tenderness to palpitation Heart: PMI not displaced, RRR, s1 s2 normal, 1/6 systolic murmur, no diastolic murmur, no rubs, gallops, thrills, or heaves Abdomen: Resolution of right upper quadrant pain;  no hepatosplenomehaly, BS+; abdominal aorta nontender and not dilated by palpation. Back: no CVA tenderness Pulses 2+ Musculoskeletal: full range of motion, normal strength, no joint deformities Extremities: no clubbing cyanosis or edema, Homan's sign negative  Neurologic: grossly nonfocal; Cranial nerves grossly wnl Psychologic: Normal mood and affect   Labs    Chemistry Recent Labs  Lab 05/11/19 0204 05/12/19 0316 05/13/19 0148  NA 134* 133* 136  K 4.0 4.0 4.1  CL 103 104 103  CO2 22 20* 23  GLUCOSE 125* 116* 96  BUN 16 11 10   CREATININE 0.99 1.14 0.93  CALCIUM 8.5* 8.8* 9.0  PROT 5.7* 6.0*  6.2*  ALBUMIN 2.2* 2.3* 2.4*  AST 55* 66* 40  ALT 44 55* 46*  ALKPHOS 262* 453* 388*  BILITOT 3.3* 1.6* 1.1  GFRNONAA >60 >60 >60  GFRAA >60 >60 >60  ANIONGAP 9 9 10      Hematology Recent Labs  Lab 05/11/19 0204 05/12/19 0316 05/13/19 0148  WBC 6.8 5.6 5.4  RBC 3.49* 3.62* 3.67*  HGB 11.6* 11.8* 11.9*  HCT 33.6* 34.1* 34.3*  MCV 96.3 94.2 93.5  MCH 33.2 32.6 32.4  MCHC 34.5 34.6 34.7  RDW 14.2 13.8 14.0  PLT 210 225 233    Cardiac EnzymesNo results for input(s): TROPONINI in the last 168 hours. No results for input(s): TROPIPOC in the last 168 hours.   BNPNo results for input(s): BNP, PROBNP in the last 168 hours.   DDimer No results for input(s): DDIMER in the last 168 hours.     Lipid Panel     Component Value Date/Time   CHOL 94 04/26/2019 0314   TRIG 63 04/26/2019 0314   HDL 16 (L) 04/26/2019 0314   CHOLHDL 5.9 04/26/2019 0314   VLDL 13 04/26/2019 0314   LDLCALC 65 04/26/2019 0314     Radiology    DG ERCP BILIARY & PANCREATIC DUCTS  Result Date: 05/13/2019 CLINICAL DATA:  Suggestion of choledocholithiasis by ultrasound. EXAM: ERCP TECHNIQUE: Multiple spot images obtained with the fluoroscopic device and submitted for interpretation post-procedure. COMPARISON:  Right upper quadrant abdominal ultrasound on 05/09/2019 FINDINGS: Imaging submitted from the ERCP procedure obtained with a C-arm demonstrates cannulation of the common bile duct with contrast injection showing a normal caliber duct. Some small filling defects during the procedure appear to likely be air bubbles. Balloon sweep maneuvers were performed. IMPRESSION: Normal caliber CBD. Some small filling defects during the procedure appear to likely be air bubbles. These images were submitted for radiologic interpretation only. Please see the procedural report for the amount of contrast and the fluoroscopy time utilized. Electronically Signed   By: 07/09/2019 M.D.   On: 05/13/2019 11:30    Cardiac Studies   Echo: 04/24/19   IMPRESSIONS  1. Left ventricular ejection fraction, by estimation, is 40 to 45%. The  left ventricle has mildly decreased function. The left ventricle  demonstrates regional wall motion abnormalities (see scoring  diagram/findings for description). There is mild left  ventricular hypertrophy. Left ventricular diastolic parameters are  consistent with Grade I diastolic dysfunction (impaired relaxation).  2. Right ventricular systolic function is severely reduced. The right  ventricular size is mildly enlarged. There is mildly elevated pulmonary  artery systolic pressure.  3. The mitral valve is grossly normal. Trivial mitral valve  regurgitation.  4. Tricuspid valve  regurgitation is mild to moderate.  5. The aortic valve has an indeterminant number of cusps. Aortic valve  regurgitation is not visualized.  6. The inferior vena cava is dilated in size with <50% respiratory  variability, suggesting right atrial pressure of 15 mmHg.   Cath: 04/24/19   Ost LM lesion is 25% stenosed.  Mid LAD lesion is 25% stenosed.  Prox Cx lesion is 90% stenosed.  Ost RCA to Mid RCA lesion is 100% stenosed.  A drug-eluting stent was successfully placed using a STENT RESOLUTE ONYX 3.5X38.  A drug-eluting stent was successfully placed using a STENT RESOLUTE ONYX 3.5X30.  Post intervention, there is a 0% residual stenosis.  LV end diastolic pressure is normal.  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 Severe 2 vessel  CAD - culprit lesion is 100% occlusion of the proximal RCA with heavy thrombus - 90% small LCx 3. Normal LVEDP 4. Successful PCI of the RCA with thrombectomy and stenting with DES x 2.  Plan: DAPT for at least one year. IV Aggrastat for 18 hours. Start beta blocker and high dose statin. Assess LV function by Echo.     Diagnostic Dominance: Right  Intervention     Patient Profile     Hunter Mcmillan is a 69 y.o. male with a hx of CAD with recent STEMI (04/26/19) successful PCI of the RCA with DES x2, HTN, HL, Stroke, ETOH, HVC/ETOH, COPD and tobacco use who is being seen today for the evaluation of management of DAPT in the setting of cholelithiasis with distended gallbladder and stones and sludge in common bile duct in need for ERCP on 05/13/2019.   Assessment & Plan    1.  CAD, recent NSTEMI April 24, 2019: Status post DES stenting x2 to RCA along thrombectomy for extensive thrombus with insertion of Resolute 3.5x38 and 3.5x30 DES stents.  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  No recurrent  chest pain.  2.  Antiplatelet therapy: Patient needs to be on DAPT for minimum of 1 year following his ACS.  In anticipation of planned ERCP on Friday, last dose of Brilinta was March 8 in a.m.  He was started on Aggrastat for glycoprotein 2b3a inhibition the evening of March 8, with dosing per pharmacy.  Aggrastat was held 6 hours prior to ERCP today.  Results reviewed.  No bleeding.  Can resume Brilinta at 90 mg twice a day  Today.  He had just received his 90 mg dose; and will have a second tonight @10  pm.  3.  PVCs with transient ventricular trigeminy reported by central monitoring; BP has been low.  Ectopy improved with low-dose carvedilol.  Depending upon blood pressure response as outpatient, carvedilol can be further titrated.  4.  Cholelithiasis and choledocholithiasis with distended gallbladder, common bile duct stone with sludge.  Abdominal discomfort improved.  Cholangiogram did not reveal any stones.  Common bile duct was swept several times.  Good blood flow noted.  No bleeding status post sphincterotomy.  5.  Elevated LFTs: Trending down with bilirubin 4.2 on 3 9-1.6 today.  AST 66/ALT 55.  Improving.  6.  Hyperlipidemia with target LDL less than 70.  Atorvastatin dosing was reduced to 40 mg from 80 mg due to LFT elevation.  Ultimately once LFTs normalize most likely can do to at higher dose if LDL is still not at target less than 70.  7.  Tobacco abuse: Last cigarette was just prior to admission..  I discussed the importance of complete smoking cessation.  We will sign off; patient should have follow-up cardiology evaluation with Dr. [place] in office.  Signed, Troy Sine, MD, [facility] 05/13/2019, 2:47 PM

## 2019-05-13 NOTE — Discharge Instructions (Signed)
Cholelithiasis  Cholelithiasis is also called "gallstones." It is a kind of gallbladder disease. The gallbladder is an organ that stores a liquid (bile) that helps you digest fat. Gallstones may not cause symptoms (may be silent gallstones) until they cause a blockage, and then they can cause pain (gallbladder attack). Follow these instructions at home:  Take over-the-counter and prescription medicines only as told by your doctor.  Stay at a healthy weight.  Eat healthy foods. This includes: ? Eating fewer fatty foods, like fried foods. ? Eating fewer refined carbs (refined carbohydrates). Refined carbs are breads and grains that are highly processed, like white bread and white rice. Instead, choose whole grains like whole-wheat bread and brown rice. ? Eating more fiber. Almonds, fresh fruit, and beans are healthy sources of fiber.  Keep all follow-up visits as told by your doctor. This is important. Contact a doctor if:  You have sudden pain in the upper right side of your belly (abdomen). Pain might spread to your right shoulder or your chest. This may be a sign of a gallbladder attack.  You feel sick to your stomach (are nauseous).  You throw up (vomit).  You have been diagnosed with gallstones that have no symptoms and you get: ? Belly pain. ? Discomfort, burning, or fullness in the upper part of your belly (indigestion). Get help right away if:  You have sudden pain in the upper right side of your belly, and it lasts for more than 2 hours.  You have belly pain that lasts for more than 5 hours.  You have a fever or chills.  You keep feeling sick to your stomach or you keep throwing up.  Your skin or the whites of your eyes turn yellow (jaundice).  You have dark-colored pee (urine).  You have light-colored poop (stool). Summary  Cholelithiasis is also called "gallstones."  The gallbladder is an organ that stores a liquid (bile) that helps you digest fat.  Silent  gallstones are gallstones that do not cause symptoms.  A gallbladder attack may cause sudden pain in the upper right side of your belly. Pain might spread to your right shoulder or your chest. If this happens, contact your doctor.  If you have sudden pain in the upper right side of your belly that lasts for more than 2 hours, get help right away. This information is not intended to replace advice given to you by your health care provider. Make sure you discuss any questions you have with your health care provider. Document Revised: 01/30/2017 Document Reviewed: 11/04/2015 Elsevier Patient Education  2020 [employer]  

## 2019-05-13 NOTE — Anesthesia Postprocedure Evaluation (Signed)
Anesthesia Post Note  Patient: [name]  Procedure(s) Performed: ENDOSCOPIC RETROGRADE CHOLANGIOPANCREATOGRAPHY (ERCP) (N/A ) SPHINCTEROTOMY REMOVAL OF STONES     Patient location during evaluation: Endoscopy Anesthesia Type: General Level of consciousness: awake and alert Pain management: pain level controlled Vital Signs Assessment: post-procedure vital signs reviewed and stable Respiratory status: spontaneous breathing, nonlabored ventilation and respiratory function stable Cardiovascular status: blood pressure returned to baseline and stable Postop Assessment: no apparent nausea or vomiting Anesthetic complications: no    Last Vitals:  Vitals:   05/13/19 1010 05/13/19 1041  BP: 115/65 118/78  Pulse: 72 79  Resp: 18 20  Temp:  36.6 C  SpO2: 98% 100%    Last Pain:  Vitals:   05/13/19 1041  TempSrc: Oral  PainSc:                  Avonelle Viveros,W. EDMOND

## 2019-05-13 NOTE — Discharge Summary (Signed)
Physician Discharge Summary  Hunter Mcmillan ZOX:[id number] DOB: [birth year] DOA: 05/09/2019  PCP: Pearline Cables, MD  Admit date: 05/09/2019 Discharge date: 05/13/2019  Admitted From: Home  Disposition:  Home   Recommendations for Outpatient Follow-up and new medication changes:  1. Follow up with Dr. Patsy Lager in 7 days.  2. Patient will resume ticagrelor bid as scheduled. 3. Follow up with surgery as outpatient for elective cholecystectomy. 4. Follow up with cardiology as scheduled. 5. Patient has been placed on carvedilol in exchange to metoprolol.   Home Health: no  Equipment/Devices: no    Discharge Condition: stable  CODE STATUS: full  Diet recommendation: heart healthy diet/ low fat.   Brief/Interim Summary: Patient was admitted to the hospital withtheworking diagnosis of recurrent cholecystitis, symptomatic choledocholithiasis in the setting of recent coronary angioplasty and stent placement.  69 year old male who presented with abdominal pain. He has significant past medical history for coronary artery disease status post angioplasty 04/24/19,for non-ST elevation myocardial infarction. Recent hospitalization for cholelithiasis 04/30/19,medically managed. At home patient had recurrent right upper quadrant abdominal pain, associated with nausea and vomiting. On hisinitial physical examination blood pressure 143/78, heart rate 97, respiratory rate20, oxygen saturation 98%. His lungs wereclear to auscultation bilaterally, heart S1-S2 present rhythmic, abdomen positive tenderness the right upper quadrant, no lower extremity edema. Sodium 134, potassium 4.1, chloride 99, bicarb 21, glucose 192, BUN 12, creatinine 0.86, alkaline phosphatase 158, lipase 18, AST 34, ALT 48, total bilirubin is 1.7, white cell count 10.6, hemoglobin 14.5, hematocrit 44.3, platelets 269.  SARS COVID-19 was negative. Urine analysis negative for infection.  Drug screen positive for  tetrahydrocannabinol.  His chest radiograph was hypoinflated, no infiltrates.  EKG 98 bpm, normal axis, sinus rhythm, no ST segment changes, inferior lateral Q waves and inverted T waves  Abdominal ultrasonography showed distended gallbladder with internal stones and sludge, associated with gallbladder wall thickening and positive sonographic Murphy sign,at least one small stone and sludge within the common bile duct which measures up to 6.7 mm  Patient was placed on supportive medical therapy including IV antibiotic, IV fluids and analgesics. Clopidogrel has been heldandpatient was placed on tirofiban IV for bridge therapy.  Patient underwent ERCP, cholangiogram did not reveal any stones, sphincterectomy was performed, common bile duct was swept several times with a 9 mm and 12 mm balloon starting at bifurcation.  Nothing was to retrived.  Good bile flow was noted.  Postprocedure his diet was advanced with good toleration.  1.  Acute cholecystitis, with choledocholithiasis complicated by sepsis, and organ failure hypotension.  Patient was admitted to the telemetry ward, he received broad-spectrum antibiotic therapy and intravenous fluids with good toleration.  Patient regained hemodynamic stability, ticagrelor was discontinued and patient was placed on tirofiban with good toleration.   Patient underwent ERCP, no stones were found.  Consulted by surgery who will follow up as an outpatient for possible elective cholecystectomy.   At discharge his white cell count is 5.4 and his cultures have been no growth.  2.  Coronary disease status post angioplasty and stent placement 04/24/2019/ LV EF 40 to 45%.  Patient had 2 severe vessel disease, 100% proximal RCA and 90% left circumflex.  He received PCI to the RCA with thrombectomy and drug-eluting stent x2.  Chronic inferior lateral ischemic changes on EKG.  No acute chest pain during this hospitalization.  Patient was placed on glycoprotein 2b3a  inhibition (antiplatelet therapy) bridge with trofiban, with no major complications.  Postprocedure patient has been resumed on ticagrelor.  Patient has been placed on carvedilol per cardiology recommendations for transient trigeminy and ectopy noted on cardiac monitoring.   Patient will continue dual antiplatelet therapy for minimum of 1 year following acute coronary syndrome  3.  Dyslipidemia.  Continue statin therapy.  4.  COPD.  No signs of acute exacerbation, continue bronchodilators and inhaled corticosteroids.  5.  Tobacco abuse.  Continue smoking cessation counseling.  6.  Acute kidney injury with nongap metabolic acidosis.  Patient received supportive medical therapy including intravenous fluids, his peak creatinine reached 1.32, at discharge is down to 0.93, potassium is 4.1 and serum bicarbonate 23.    Discharge Diagnoses:  Principal Problem:   Cholelithiasis Active Problems:   Hx of non-ST elevation myocardial infarction (NSTEMI)   Cocaine abuse (HCC)   COPD (chronic obstructive pulmonary disease) (HCC)   Essential hypertension   Acute cholecystitis    Discharge Instructions   Allergies as of 05/13/2019   No Known Allergies     Medication List    STOP taking these medications   metoprolol succinate 25 MG 24 hr tablet Commonly known as: TOPROL-XL     TAKE these medications   acetaminophen 500 MG tablet Commonly known as: TYLENOL Take 1 tablet (500 mg total) by mouth every 6 (six) hours as needed for mild pain, fever or headache.   albuterol 108 (90 Base) MCG/ACT inhaler Commonly known as: VENTOLIN HFA Inhale 2 puffs into the lungs every 6 (six) hours as needed for wheezing or shortness of breath.   aspirin EC 81 MG tablet Take 81 mg by mouth daily.   atorvastatin 80 MG tablet Commonly known as: LIPITOR Take 1 tablet (80 mg total) by mouth daily at 6 PM.   carvedilol 3.125 MG tablet Commonly known as: COREG Take 1 tablet (3.125 mg total) by mouth 2  (two) times daily with a meal.   colchicine 0.6 MG tablet Take 1 tablet (0.6 mg total) by mouth daily.   cyclobenzaprine 10 MG tablet Commonly known as: FLEXERIL TAKE 1 TABLET BY MOUTH TWICE DAILY AS NEEDED FOR MUSCLE SPASM What changed: See the new instructions.   fluticasone 110 MCG/ACT inhaler Commonly known as: FLOVENT HFA Inhale 2 puffs into the lungs 2 (two) times daily.   multivitamin with minerals Tabs tablet Take 1 tablet by mouth daily.   nitroGLYCERIN 0.4 MG SL tablet Commonly known as: NITROSTAT Place 1 tablet (0.4 mg total) under the tongue every 5 (five) minutes as needed for chest pain.   ticagrelor 90 MG Tabs tablet Commonly known as: BRILINTA Take 1 tablet (90 mg total) by mouth 2 (two) times daily.      Follow-up Information    Surgery, [facility]. Call.   Specialty: General Surgery Why: Call and schedule an appointment to discuss elective cholecystectomy when cleared by cardiology.  Contact information: [address] [place] [address] [phone]          No Known Allergies  Consultations:  GI  Surgery   Cardiology    Procedures/Studies: NM Hepatobiliary Liver Func  Result Date: 04/30/2019 CLINICAL DATA:  Abnormal liver enzymes. EXAM: NUCLEAR MEDICINE HEPATOBILIARY IMAGING TECHNIQUE: Sequential images of the abdomen were obtained out to 60 minutes following intravenous administration of radiopharmaceutical. RADIOPHARMACEUTICALS:  5.45 mCi Tc-59m  Choletec IV COMPARISON:  Ultrasound evaluation of 04/30/2019 FINDINGS: Uptake in the liver occurs over time, no visible bowel or biliary activity. Cardiac activity persists for an extended. Quite notable at 10, 15 and 20 minutes. IMPRESSION: 1. Persistent  cardiac activity with liver activity and no sign of biliary or bowel activity, findings favor marked hepatic dysfunction. Biliary obstruction remains within the differential given this pattern of uptake. 2. MRI/MRCP could be  considered for further evaluation given above findings, to exclude acute biliary obstruction. Electronically Signed   By: Donzetta Kohut M.D.   On: 04/30/2019 12:29   CARDIAC CATHETERIZATION  Result Date: 04/24/2019  Prox Cx lesion is 90% stenosed.  Previously placed Prox RCA to Mid RCA stent (unknown type) is widely patent.  1. Continued excellent patency of the RCA stents. The LCx stenosis is chronic and this is a small vessel. Plan: continue medical management. Will remove femoral sheath manually. Will add colchicine for possible pericardial component to pain. Prn analgesics.   CARDIAC CATHETERIZATION  Result Date: 04/24/2019  Suezanne Jacquet LM lesion is 25% stenosed.  Mid LAD lesion is 25% stenosed.  Prox Cx lesion is 90% stenosed.  Ost RCA to Mid RCA lesion is 100% stenosed.  A drug-eluting stent was successfully placed using a STENT RESOLUTE ONYX 3.5X38.  A drug-eluting stent was successfully placed using a STENT RESOLUTE ONYX 3.5X30.  Post intervention, there is a 0% residual stenosis.  LV end diastolic pressure is normal.  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 Severe 2 vessel CAD    - culprit lesion is 100% occlusion of the proximal RCA with heavy thrombus    - 90% small LCx 3. Normal LVEDP 4. Successful PCI of the RCA with thrombectomy and stenting with DES x 2. Plan: DAPT for at least one year. IV Aggrastat for 18 hours. Start beta blocker and high dose statin. Assess LV function by Echo.   DG Chest Port 1 View  Result Date: 05/09/2019 CLINICAL DATA:  Recent STEMI.  Symptomatic cholelithiasis. EXAM: PORTABLE CHEST 1 VIEW COMPARISON:  April 24, 2019 FINDINGS: There is stable borderline cardiomegaly. Inspiration is shallow and central pulmonary vascular congestion more pronounced. Mild bilateral infrahilar bronchial wall thickening has developed. Interstitial opacities in the mid lung zones may be atelectatic or inflammatory. There is no obvious pleural effusion or overt pulmonary  edema or pneumothorax. Moderate skeletal degenerative change, aortic knob calcified atherosclerosis and telemetry leads are present. There is no free subdiaphragmatic air. IMPRESSION: Stable borderline cardiomegaly with mild bilateral infrahilar bronchitis and interstitial opacities in both mid lung zones that may be atelectatic or inflammatory. No pulmonary edema or obvious pleural effusion or free subdiaphragmatic air. Electronically Signed   By: Laurence Ferrari   On: 05/09/2019 16:02   DG Chest Port 1 View  Result Date: 04/24/2019 CLINICAL DATA:  Chest pain EXAM: PORTABLE CHEST 1 VIEW COMPARISON:  07/30/2017 chest radiograph. FINDINGS: In Stable cardiomediastinal silhouette with normal heart size. No pneumothorax. No pleural effusion. Lungs appear clear, with no acute consolidative airspace disease and no pulmonary edema. IMPRESSION: No active disease. Electronically Signed   By: Delbert Phenix M.D.   On: 04/24/2019 08:24   DG ERCP BILIARY & PANCREATIC DUCTS  Result Date: 05/13/2019 CLINICAL DATA:  Suggestion of choledocholithiasis by ultrasound. EXAM: ERCP TECHNIQUE: Multiple spot images obtained with the fluoroscopic device and submitted for interpretation post-procedure. COMPARISON:  Right upper quadrant abdominal ultrasound on 05/09/2019 FINDINGS: Imaging submitted from the ERCP procedure obtained with a C-arm demonstrates cannulation of the common bile duct with contrast injection showing a normal caliber duct. Some small filling defects during the procedure appear to likely be air bubbles. Balloon sweep maneuvers were performed. IMPRESSION: Normal caliber CBD. Some small  filling defects during the procedure appear to likely be air bubbles. These images were submitted for radiologic interpretation only. Please see the procedural report for the amount of contrast and the fluoroscopy time utilized. Electronically Signed   By: Irish Lack M.D.   On: 05/13/2019 11:30   MR ABDOMEN MRCP W WO  CONTAST  Result Date: 05/02/2019 CLINICAL DATA:  Severe right upper quadrant abdominal pain. EXAM: MRI ABDOMEN WITHOUT AND WITH CONTRAST (INCLUDING MRCP) TECHNIQUE: Multiplanar multisequence MR imaging of the abdomen was performed both before and after the administration of intravenous contrast. Heavily T2-weighted images of the biliary and pancreatic ducts were obtained, and three-dimensional MRCP images were rendered by post processing. CONTRAST:  80mL GADAVIST GADOBUTROL 1 MMOL/ML IV SOLN COMPARISON:  CT scan 12/13/2018, ultrasound 04/30/2019 and nuclear medicine hepatic biliary scan 04/30/2019. FINDINGS: Lower chest: A small left pleural effusion is noted. Minimal bibasilar atelectasis. The heart is mildly enlarged but appears stable. No pericardial effusion. Hepatobiliary: No worrisome hepatic lesions or intrahepatic biliary dilatation. I do not see any morphologic features of cirrhosis. Numerous tiny layering gallstones are noted the gallbladder no gallbladder wall thickening, pericholecystic fluid or pericholecystic inflammatory changes to suggest acute cholecystitis. Normal caliber and course of the common bile duct. No common bile duct stones are identified. Pancreas:  No mass, inflammation or ductal dilatation. Spleen:  Normal size. No focal lesions. Adrenals/Urinary Tract: The adrenal glands and kidneys are unremarkable. Stomach/Bowel: The stomach, duodenum, visualized small bowel and visualized colon are unremarkable. Vascular/Lymphatic: Aortic atherosclerosis but no aneurysm or dissection. The branch vessels are patent. The major venous structures are patent. No mesenteric or retroperitoneal mass or adenopathy. Other:  No ascites or abdominal hernia. Musculoskeletal: No significant bony findings. IMPRESSION: 1. Cholelithiasis but no MR findings to suggest acute cholecystitis. 2. Normal caliber and course of the common bile duct. No common bile duct stones. 3. No acute abdominal findings, mass lesions or  adenopathy. 4. Small left pleural effusion. Electronically Signed   By: Rudie Meyer M.D.   On: 05/02/2019 08:00   ECHOCARDIOGRAM COMPLETE  Result Date: 04/24/2019    ECHOCARDIOGRAM REPORT   Patient Name:   Hunter Mcmillan Date of Exam: 04/24/2019 Medical Rec #:  [id number]        Height:       71.0 in Accession #:    [id number]       Weight:       150.0 lb Date of Birth:  01-04-1951        BSA:          1.866 m Patient Age:    68 years         BP:           110/63 mmHg Patient Gender: M                HR:           65 bpm. Exam Location:  Inpatient Procedure: 2D Echo, Cardiac Doppler and Color Doppler Indications:    CAD  History:        Patient has prior history of Echocardiogram examinations, most                 recent 01/06/2012.  Sonographer:    Roosvelt Maser RDCS Referring Phys: [phone] PETER M [place] IMPRESSIONS  1. Left ventricular ejection fraction, by estimation, is 40 to 45%. The left ventricle has mildly decreased function. The left ventricle demonstrates regional wall motion abnormalities (see scoring diagram/findings for description). There  is mild left ventricular hypertrophy. Left ventricular diastolic parameters are consistent with Grade I diastolic dysfunction (impaired relaxation).  2. Right ventricular systolic function is severely reduced. The right ventricular size is mildly enlarged. There is mildly elevated pulmonary artery systolic pressure.  3. The mitral valve is grossly normal. Trivial mitral valve regurgitation.  4. Tricuspid valve regurgitation is mild to moderate.  5. The aortic valve has an indeterminant number of cusps. Aortic valve regurgitation is not visualized.  6. The inferior vena cava is dilated in size with <50% respiratory variability, suggesting right atrial pressure of 15 mmHg. FINDINGS  Left Ventricle: Left ventricular ejection fraction, by estimation, is 40 to 45%. The left ventricle has mildly decreased function. The left ventricle demonstrates regional wall motion  abnormalities. The left ventricular internal cavity size was normal in size. There is mild left ventricular hypertrophy. Left ventricular diastolic parameters are consistent with Grade I diastolic dysfunction (impaired relaxation).  LV Wall Scoring: The entire inferior wall, basal inferolateral segment, and basal anterolateral segment are hypokinetic. The entire anterior wall, mid and distal lateral wall, entire septum, mid anterolateral segment, and apex are normal. Right Ventricle: The right ventricular size is mildly enlarged. No increase in right ventricular wall thickness. Right ventricular systolic function is severely reduced. There is mildly elevated pulmonary artery systolic pressure. The tricuspid regurgitant velocity is 2.09 m/s, and with an assumed right atrial pressure of 15 mmHg, the estimated right ventricular systolic pressure is 32.5 mmHg. Left Atrium: Left atrial size was normal in size. Right Atrium: Right atrial size was normal in size. Pericardium: There is no evidence of pericardial effusion. Mitral Valve: The mitral valve is grossly normal. Trivial mitral valve regurgitation. Tricuspid Valve: The tricuspid valve is grossly normal. Tricuspid valve regurgitation is mild to moderate. Aortic Valve: The aortic valve has an indeterminant number of cusps. Aortic valve regurgitation is not visualized. There is mild calcification of the aortic valve. Pulmonic Valve: The pulmonic valve was not well visualized. Pulmonic valve regurgitation is not visualized. Aorta: The aortic root is normal in size and structure. Venous: The inferior vena cava is dilated in size with less than 50% respiratory variability, suggesting right atrial pressure of 15 mmHg. IAS/Shunts: No atrial level shunt detected by color flow Doppler.  LEFT VENTRICLE PLAX 2D LVIDd:         3.40 cm     Diastology LVIDs:         2.40 cm     LV e' lateral:   8.38 cm/s LV PW:         1.30 cm     LV E/e' lateral: 5.1 LV IVS:        1.10 cm     LV  e' medial:    5.18 cm/s LVOT diam:     2.00 cm     LV E/e' medial:  8.3 LV SV:         41.47 ml LV SV Index:   22.22 LVOT Area:     3.14 cm  LV Volumes (MOD) LV vol d, MOD A2C: 55.3 ml LV vol d, MOD A4C: 77.5 ml LV vol s, MOD A2C: 35.2 ml LV vol s, MOD A4C: 41.2 ml LV SV MOD A2C:     20.1 ml LV SV MOD A4C:     77.5 ml LV SV MOD BP:      26.9 ml RIGHT VENTRICLE            IVC RV Basal diam:  4.60 cm  IVC diam: 2.20 cm RV Mid diam:    4.20 cm RV S prime:     5.63 cm/s TAPSE (M-mode): 0.7 cm LEFT ATRIUM             Index       RIGHT ATRIUM           Index LA diam:        3.10 cm 1.66 cm/m  RA Area:     14.20 cm LA Vol (A2C):   52.0 ml 27.87 ml/m RA Volume:   37.80 ml  20.26 ml/m LA Vol (A4C):   41.6 ml 22.29 ml/m LA Biplane Vol: 50.5 ml 27.06 ml/m  AORTIC VALVE LVOT Vmax:   79.00 cm/s LVOT Vmean:  44.700 cm/s LVOT VTI:    0.132 m  AORTA Ao Root diam: 2.90 cm MITRAL VALVE               TRICUSPID VALVE MV Area (PHT): 2.32 cm    TR Peak grad:   17.5 mmHg MV Decel Time: 327 msec    TR Vmax:        209.00 cm/s MV E velocity: 42.80 cm/s MV A velocity: 58.70 cm/s  SHUNTS MV E/A ratio:  0.73        Systemic VTI:  0.13 m                            Systemic Diam: 2.00 cm Rozann Lesches MD Electronically signed by Rozann Lesches MD Signature Date/Time: 04/24/2019/5:13:12 PM    Final    US Abdomen Limited RUQ  Result Date: 05/09/2019 CLINICAL DATA:  Initial evaluation for acute right upper quadrant pain. EXAM: ULTRASOUND ABDOMEN LIMITED RIGHT UPPER QUADRANT COMPARISON:  Recent MRI from 05/02/2019. FINDINGS: Gallbladder: Gallbladder distended measuring up to 13.2 cm in length. Layering echogenic material within the gallbladder lumen consistent with sludge. Multiple scattered superimposed tiny stones indicated by sonographer. Gallbladder wall mildly thickened up to 4 mm. No free pericholecystic fluid. Positive sonographic Murphy sign elicited on exam. Common bile duct: Diameter: 6.7 mm. At least 1 small stone measuring  up to 3.6 mm present within the common bile duct. Probable adjacent sludge noted as well. Liver: No focal lesion identified. Within normal limits in parenchymal echogenicity. Portal vein is patent on color Doppler imaging with normal direction of blood flow towards the liver. Other: None. IMPRESSION: 1. Distended gallbladder with internal stones and sludge, with associated mild gallbladder wall thickening and positive sonographic Murphy sign. Clinical correlation for possible acute cholecystitis recommended. 2. At least 1 small stone and sludge within the common bile duct which measures up to 6.7 mm. Electronically Signed   By: Jeannine Boga M.D.   On: 05/09/2019 02:26   US Abdomen Limited RUQ  Result Date: 04/30/2019 CLINICAL DATA:  69 year old male with acute RIGHT UPPER abdominal pain for 1 day. EXAM: ULTRASOUND ABDOMEN LIMITED RIGHT UPPER QUADRANT COMPARISON:  10/12/2018 ultrasound FINDINGS: Gallbladder: Multiple tiny mobile gallstones are noted. There is no evidence of gallbladder wall thickening, pericholecystic fluid or sonographic Murphy sign. Common bile duct: Diameter: 6 mm.  No intrahepatic or extrahepatic biliary dilatation. Liver: No focal lesion identified. Within normal limits in parenchymal echogenicity. Portal vein is patent on color Doppler imaging with normal direction of blood flow towards the liver. Other: A small RIGHT pleural effusion is identified. IMPRESSION: 1. Cholelithiasis without other sonographic evidence of acute cholecystitis. No biliary dilatation. 2. Small RIGHT pleural effusion. Electronically Signed  By: Harmon Pier M.D.   On: 04/30/2019 08:22      Procedures:  ERCP on 05/13/19.  Subjective: Patient is feeling well, no nausea or vomiting and tolerating po well, he has been out of bed and ambulating, with no chest pain or dyspnea.   Discharge Exam: Vitals:   05/13/19 1200 05/13/19 1429  BP:  107/69  Pulse:  73  Resp: 16 18  Temp:  97.9 F (36.6 C)   SpO2:  98%   Vitals:   05/13/19 1010 05/13/19 1041 05/13/19 1200 05/13/19 1429  BP: 115/65 118/78  107/69  Pulse: 72 79  73  Resp: Temp:  97.9 F (36.6 C)  97.9 F (36.6 C)  TempSrc:  Oral  Oral  SpO2: 98% 100%  98%  Weight:      Height:        General: Not in pain or dyspnea.  Neurology: Awake and alert, non focal  E ENT: no pallor, no icterus, oral mucosa moist Cardiovascular: No JVD. S1-S2 present, rhythmic, no gallops, rubs, or murmurs. No lower extremity edema. Pulmonary: positive breath sounds bilaterally, adequate air movement, no wheezing, rhonchi or rales. Gastrointestinal. Abdomen with, no organomegaly, non tender, no rebound or guarding Skin. No rashes Musculoskeletal: no joint deformities   The results of significant diagnostics from this hospitalization (including imaging, microbiology, ancillary and laboratory) are listed below for reference.     Microbiology: Recent Results (from the past 240 hour(s))  Respiratory Panel by RT PCR (Flu A&B, Covid) - Nasopharyngeal Swab     Status: None   Collection Time: 05/09/19  3:02 AM   Specimen: Nasopharyngeal Swab  Result Value Ref Range Status   SARS Coronavirus 2 by RT PCR NEGATIVE NEGATIVE Final    Comment: (NOTE) SARS-CoV-2 target nucleic acids are NOT DETECTED. The SARS-CoV-2 RNA is generally detectable in upper respiratoy specimens during the acute phase of infection. The lowest concentration of SARS-CoV-2 viral copies this assay can detect is 131 copies/mL. A negative result does not preclude SARS-Cov-2 infection and should not be used as the sole basis for treatment or other patient management decisions. A negative result may occur with  improper specimen collection/handling, submission of specimen other than nasopharyngeal swab, presence of viral mutation(s) within the areas targeted by this assay, and inadequate number of viral copies (<131 copies/mL). A negative result must be combined with  clinical observations, patient history, and epidemiological information. The expected result is Negative. Fact Sheet for Patients:  https://www.moore.com/ Fact Sheet for Healthcare Providers:  https://www.young.biz/ This test is not yet ap proved or cleared by the [place] FDA and  has been authorized for detection and/or diagnosis of SARS-CoV-2 by FDA under an Emergency Use Authorization (EUA). This EUA will remain  in effect (meaning this test can be used) for the duration of the COVID-19 declaration under Section 564(b)(1) of the Act, 21 U.S.C. section 360bbb-3(b)(1), unless the authorization is terminated or revoked sooner.    Influenza A by PCR NEGATIVE NEGATIVE Final   Influenza B by PCR NEGATIVE NEGATIVE Final    Comment: (NOTE) The Xpert Xpress SARS-CoV-2/FLU/RSV assay is intended as an aid in  the diagnosis of influenza from Nasopharyngeal swab specimens and  should not be used as a sole basis for treatment. Nasal washings and  aspirates are unacceptable for Xpert Xpress SARS-CoV-2/FLU/RSV  testing. Fact Sheet for Patients: https://www.moore.com/ Fact Sheet for Healthcare Providers: https://www.young.biz/ This test is not yet approved or cleared by the [place]  States FDA and  has been authorized for detection and/or diagnosis of SARS-CoV-2 by  FDA under an Emergency Use Authorization (EUA). This EUA will remain  in effect (meaning this test can be used) for the duration of the  Covid-19 declaration under Section 564(b)(1) of the Act, 21  U.S.C. section 360bbb-3(b)(1), unless the authorization is  terminated or revoked. Performed at [facility], [address] [place], [place] [address]   Culture, blood (routine x 2)     Status: None (Preliminary result)   Collection Time: 05/09/19  3:44 PM   Specimen: BLOOD  Result Value Ref Range Status   Specimen Description BLOOD RIGHT  ANTECUBITAL  Final   Special Requests   Final    BOTTLES DRAWN AEROBIC ONLY Blood Culture adequate volume   Culture   Final    NO GROWTH 4 DAYS Performed at [facility], [address] [place], [place] [address]    Report Status PENDING  Incomplete  Culture, blood (routine x 2)     Status: None (Preliminary result)   Collection Time: 05/09/19  3:49 PM   Specimen: BLOOD RIGHT HAND  Result Value Ref Range Status   Specimen Description BLOOD RIGHT HAND  Final   Special Requests   Final    BOTTLES DRAWN AEROBIC ONLY Blood Culture adequate volume   Culture   Final    NO GROWTH 4 DAYS Performed at [facility], [address] [place], [place] [address]    Report Status PENDING  Incomplete  MRSA PCR Screening     Status: None   Collection Time: 05/11/19 11:38 AM   Specimen: Nasal Mucosa; Nasopharyngeal  Result Value Ref Range Status   MRSA by PCR NEGATIVE NEGATIVE Final    Comment:        The GeneXpert MRSA Assay (FDA approved for NASAL specimens only), is one component of a comprehensive MRSA colonization surveillance program. It is not intended to diagnose MRSA infection nor to guide or monitor treatment for MRSA infections. Performed at [facility], [address] [place], [place] [address]      Labs: BNP (last 3 results) No results for input(s): BNP in the last 8760 hours. Basic Metabolic Panel: Recent Labs  Lab 05/09/19 0438 05/10/19 0225 05/11/19 0204 05/12/19 0316 05/13/19 0148  NA 135 133* 134* 133* 136  K 4.4 4.8 4.0 4.0 4.1  CL 102 99 103 104 103  CO2 22 24 22  20* 23  GLUCOSE 123* 105* 125* 116* 96  BUN 10 17 16 11 10   CREATININE 0.88 1.32* 0.99 1.14 0.93  CALCIUM 9.2 8.3* 8.5* 8.8* 9.0   Liver Function Tests: Recent Labs  Lab 05/09/19 0438 05/10/19 0225 05/11/19 0204 05/12/19 0316 05/13/19 0148  AST 25 62* 55* 66* 40  ALT 42 52* 44 55* 46*  ALKPHOS 156* 227* 262* 453* 388*  BILITOT 1.7* 4.2* 3.3* 1.6* 1.1  PROT 7.7 5.8*  5.7* 6.0* 6.2*  ALBUMIN 3.2* 2.3* 2.2* 2.3* 2.4*   Recent Labs  Lab 05/09/19 0122  LIPASE 18   No results for input(s): AMMONIA in the last 168 hours. CBC: Recent Labs  Lab 05/09/19 0122 05/09/19 0122 05/09/19 07/09/19 05/10/19 0225 05/11/19 0204 05/12/19 0316 05/13/19 0148  WBC 10.6*   < > 11.5* 9.2 6.8 5.6 5.4  NEUTROABS 8.8*  --   --  7.6 4.8 3.5 3.1  HGB 14.5   < > 14.5 11.5* 11.6* 11.8* 11.9*  HCT 44.3   < >  44.1 34.0* 33.6* 34.1* 34.3*  MCV 99.8   < > 99.1 96.9 96.3 94.2 93.5  PLT 269   < > 248 197 210 225 233   < > = values in this interval not displayed.   Cardiac Enzymes: No results for input(s): CKTOTAL, CKMB, CKMBINDEX, TROPONINI in the last 168 hours. BNP: Invalid input(s): POCBNP CBG: No results for input(s): GLUCAP in the last 168 hours. D-Dimer No results for input(s): DDIMER in the last 72 hours. Hgb A1c No results for input(s): HGBA1C in the last 72 hours. Lipid Profile No results for input(s): CHOL, HDL, LDLCALC, TRIG, CHOLHDL, LDLDIRECT in the last 72 hours. Thyroid function studies No results for input(s): TSH, T4TOTAL, T3FREE, THYROIDAB in the last 72 hours.  Invalid input(s): FREET3 Anemia work up No results for input(s): VITAMINB12, FOLATE, FERRITIN, TIBC, IRON, RETICCTPCT in the last 72 hours. Urinalysis    Component Value Date/Time   COLORURINE AMBER (A) 05/09/2019 0122   APPEARANCEUR CLEAR 05/09/2019 0122   LABSPEC 1.023 05/09/2019 0122   PHURINE 6.0 05/09/2019 0122   GLUCOSEU 50 (A) 05/09/2019 0122   HGBUR NEGATIVE 05/09/2019 0122   BILIRUBINUR NEGATIVE 05/09/2019 0122   KETONESUR NEGATIVE 05/09/2019 0122   PROTEINUR 100 (A) 05/09/2019 0122   UROBILINOGEN 1.0 01/05/2012 1400   NITRITE NEGATIVE 05/09/2019 0122   LEUKOCYTESUR NEGATIVE 05/09/2019 0122   Sepsis Labs Invalid input(s): PROCALCITONIN,  WBC,  LACTICIDVEN Microbiology Recent Results (from the past 240 hour(s))  Respiratory Panel by RT PCR (Flu A&B, Covid) - Nasopharyngeal  Swab     Status: None   Collection Time: 05/09/19  3:02 AM   Specimen: Nasopharyngeal Swab  Result Value Ref Range Status   SARS Coronavirus 2 by RT PCR NEGATIVE NEGATIVE Final    Comment: (NOTE) SARS-CoV-2 target nucleic acids are NOT DETECTED. The SARS-CoV-2 RNA is generally detectable in upper respiratoy specimens during the acute phase of infection. The lowest concentration of SARS-CoV-2 viral copies this assay can detect is 131 copies/mL. A negative result does not preclude SARS-Cov-2 infection and should not be used as the sole basis for treatment or other patient management decisions. A negative result may occur with  improper specimen collection/handling, submission of specimen other than nasopharyngeal swab, presence of viral mutation(s) within the areas targeted by this assay, and inadequate number of viral copies (<131 copies/mL). A negative result must be combined with clinical observations, patient history, and epidemiological information. The expected result is Negative. Fact Sheet for Patients:  https://www.moore.com/ Fact Sheet for Healthcare Providers:  https://www.young.biz/ This test is not yet ap proved or cleared by the [place] FDA and  has been authorized for detection and/or diagnosis of SARS-CoV-2 by FDA under an Emergency Use Authorization (EUA). This EUA will remain  in effect (meaning this test can be used) for the duration of the COVID-19 declaration under Section 564(b)(1) of the Act, 21 U.S.C. section 360bbb-3(b)(1), unless the authorization is terminated or revoked sooner.    Influenza A by PCR NEGATIVE NEGATIVE Final   Influenza B by PCR NEGATIVE NEGATIVE Final    Comment: (NOTE) The Xpert Xpress SARS-CoV-2/FLU/RSV assay is intended as an aid in  the diagnosis of influenza from Nasopharyngeal swab specimens and  should not be used as a sole basis for treatment. Nasal washings and  aspirates are  unacceptable for Xpert Xpress SARS-CoV-2/FLU/RSV  testing. Fact Sheet for Patients: https://www.moore.com/ Fact Sheet for Healthcare Providers: https://www.young.biz/ This test is not yet approved or cleared by the [place] and  has been authorized for detection and/or diagnosis of SARS-CoV-2 by  FDA under an Emergency Use Authorization (EUA). This EUA will remain  in effect (meaning this test can be used) for the duration of the  Covid-19 declaration under Section 564(b)(1) of the Act, 21  U.S.C. section 360bbb-3(b)(1), unless the authorization is  terminated or revoked. Performed at [facility], [address], [place], [place] [address]   Culture, blood (routine x 2)     Status: None (Preliminary result)   Collection Time: 05/09/19  3:44 PM   Specimen: BLOOD  Result Value Ref Range Status   Specimen Description BLOOD RIGHT ANTECUBITAL  Final   Special Requests   Final    BOTTLES DRAWN AEROBIC ONLY Blood Culture adequate volume   Culture   Final    NO GROWTH 4 DAYS Performed at [facility], [address] [place], [place] [address]    Report Status PENDING  Incomplete  Culture, blood (routine x 2)     Status: None (Preliminary result)   Collection Time: 05/09/19  3:49 PM   Specimen: BLOOD RIGHT HAND  Result Value Ref Range Status   Specimen Description BLOOD RIGHT HAND  Final   Special Requests   Final    BOTTLES DRAWN AEROBIC ONLY Blood Culture adequate volume   Culture   Final    NO GROWTH 4 DAYS Performed at [facility], [address] [place], [place] [address]    Report Status PENDING  Incomplete  MRSA PCR Screening     Status: None   Collection Time: 05/11/19 11:38 AM   Specimen: Nasal Mucosa; Nasopharyngeal  Result Value Ref Range Status   MRSA by PCR NEGATIVE NEGATIVE Final    Comment:        The GeneXpert MRSA Assay (FDA approved for NASAL specimens only), is one component of  a comprehensive MRSA colonization surveillance program. It is not intended to diagnose MRSA infection nor to guide or monitor treatment for MRSA infections. Performed at [facility], [address], [place], [place] [address]      Time coordinating discharge: 45 minutes  SIGNED:   Coralie Keens, MD  [facility] 05/13/2019, 2:37 PM

## 2019-05-13 NOTE — Plan of Care (Signed)

## 2019-05-13 NOTE — Op Note (Addendum)
[name] Patient Name: Hunter Mcmillan Procedure Date : 05/13/2019 MRN: [id number] Attending MD: Ronnette Juniper , MD Date of Birth: January 21, 1951 CSN: [id number] Age: 69 Admit Type: Inpatient Procedure:                ERCP Indications:              Common bile duct stone(s) Providers:                Ronnette Juniper, MD, Grace Isaac, RN, Elspeth Cho                            Tech., Technician, Dewitt Hoes, CRNA Referring MD:             [facility] Medicines:                Monitored Anesthesia Care Complications:            No immediate complications. Estimated blood loss:                            None Estimated Blood Loss:     Estimated blood loss: none. Procedure:                Pre-Anesthesia Assessment:                           - Prior to the procedure, a History and Physical                            was performed, and patient medications and                            allergies were reviewed. The patient's tolerance of                            previous anesthesia was also reviewed. The risks                            and benefits of the procedure and the sedation                            options and risks were discussed with the patient.                            All questions were answered, and informed consent                            was obtained. Prior Anticoagulants: The patient has                            taken Brilinta, last dose was 5 days prior to                            procedure. ASA Grade Assessment: III - A patient  with severe systemic disease. After reviewing the                            risks and benefits, the patient was deemed in                            satisfactory condition to undergo the procedure.                           After obtaining informed consent, the scope was                            passed under direct vision. Throughout the                            procedure, the patient's  blood pressure, pulse, and                            oxygen saturations were monitored continuously. The                            [id number]-[id number] ([id number]) Olympus Duodensocope was                            introduced through the mouth, and used to inject                            contrast into and used to cannulate the bile duct.                            The ERCP was accomplished without difficulty. The                            patient tolerated the procedure well. Scope In: Scope Out: Findings:      The scout film was normal. The esophagus was successfully intubated       under direct vision. The scope was advanced to a normal major papilla in       the descending duodenum without detailed examination of the pharynx,       larynx and associated structures, and upper GI tract. The upper GI tract       was grossly normal.      Small superficial ulcers were noted in the duodenum.      Two diverticulae was noted in the second portion of duodenum.      The ampulla was located behind one of the diverticular folds and was       visible after lifting the diverticular fold with a sphinctertome.      It was easily canulated on the first attempt.      The wire was noted to advance into the cystic duct and after       repositioning, the bile duct was deeply cannulated with the       sphincterotome.      Contrast was injected. I personally interpreted the bile duct images.       There was brisk flow of contrast through the  ducts. Image quality was       excellent. Contrast extended to the main bile duct.      The main bile duct was normal.      A straight Roadrunner wire was passed into the biliary tree.      A 10 mm biliary sphincterotomy was made with a braided sphincterotome       using ERBE electrocautery.      There was no post-sphincterotomy bleeding.      The biliary tree was swept with a 9 mm and 12 mm balloon starting at the       bifurcation. After mulitple sweeps, nothing was  found.      Air bubbles were noted in the CBD after sphincterotomy, however, there       were no stones.      The pancreatic duct was never canulated or injected during the procedure.      There was good drainage of bile noted post sphincterotomy. Impression:               - A biliary sphincterotomy was performed.                           - The biliary tree was swept and nothing was found.                           - Small superficial ulcers were noted in the                            duodenum. Moderate Sedation:      Patient did not receive moderate sedation for this procedure, but       instead received monitored anesthesia care. Recommendation:           - Advance diet as tolerated, low fat diet.                           - Consider cholecystectomy, likely as an                            outpatient, as per surgical evaluation.                           - Ok to resume Brilinta from today.                           - PPI once a day. Procedure Code(s):        --- Professional ---                           [phone], Endoscopic retrograde                            cholangiopancreatography (ERCP); with                            sphincterotomy/papillotomy Diagnosis Code(s):        --- Professional ---  K80.50, Calculus of bile duct without cholangitis                            or cholecystitis without obstruction CPT copyright 2019 American Medical Association. All rights reserved. The codes documented in this report are preliminary and upon coder review may  be revised to meet current compliance requirements. Kerin Salen, MD 05/13/2019 9:47:50 AM This report has been signed electronically. Number of Addenda: 0

## 2019-05-13 NOTE — Progress Notes (Signed)
Pharmacy Antibiotic Note  Hunter Mcmillan is a 69 y.o. male admitted on 05/09/2019 with abdominal pain. He is s/p recent STEMI on 04/24/19.  Pharmacy consulted for Zosyn dosing (day 5 of antibiotics) for intra-abdominal infection (Acute cholecystitis) -WBC= 5.4, afebrile, CrCl ~ 70, cultures- ngtd  Plan: Continue  Zosyn 3.375 g IV q8 hours Consider defining an end date Will follow renal function, cultures and clinical progress     Height: 5\' 11"  (180.3 cm) Weight: 154 lb 5.2 oz (70 kg) IBW/kg (Calculated) : 75.3  Temp (24hrs), Avg:98 F (36.7 C), Min:97.7 F (36.5 C), Max:98.4 F (36.9 C)  Recent Labs  Lab 05/09/19 0438 05/10/19 0225 05/11/19 0204 05/12/19 0316 05/13/19 0148  WBC 11.5* 9.2 6.8 5.6 5.4  CREATININE 0.88 1.32* 0.99 1.14 0.93    Estimated Creatinine Clearance: 74.2 mL/min (by C-G formula based on SCr of 0.93 mg/dL).    No Known Allergies  Antimicrobials this admission: Zosyn 3/8>> Vancomycin 3/9>> 3/11  Dose adjustments this admission:  Microbiology results: 3/8 COVID: negative 3/8 Blood Cx: ngtd  Thank you for allowing pharmacy to be a part of this patient's care.  5/8, PharmD Clinical Pharmacist **Pharmacist phone directory can now be found on [email or web address] ([email or web address]).  Listed under [facility].

## 2019-05-13 NOTE — Transfer of Care (Signed)
Immediate Anesthesia Transfer of Care Note  Patient: Delvis Kau  Procedure(s) Performed: ENDOSCOPIC RETROGRADE CHOLANGIOPANCREATOGRAPHY (ERCP) (N/A ) SPHINCTEROTOMY REMOVAL OF STONES  Patient Location: PACU and Endoscopy Unit  Anesthesia Type:General  Level of Consciousness: awake, alert  and oriented  Airway & Oxygen Therapy: Patient Spontanous Breathing and Patient connected to face mask oxygen  Post-op Assessment: Report given to RN and Post -op Vital signs reviewed and stable  Post vital signs: Reviewed and stable  Last Vitals:  Vitals Value Taken Time  BP 115/64 05/13/19 0950  Temp    Pulse 83 05/13/19 0950  Resp 18 05/13/19 0950  SpO2 100 % 05/13/19 0950  Vitals shown include unvalidated device data.  Last Pain:  Vitals:   05/13/19 0819  TempSrc: Oral  PainSc: 0-No pain      Patients Stated Pain Goal: 2 (05/11/19 9784)  Complications: No apparent anesthesia complications

## 2019-05-13 NOTE — Anesthesia Procedure Notes (Signed)
Procedure Name: Intubation Date/Time: 05/13/2019 8:59 AM Performed by: Trinna Post., CRNA Pre-anesthesia Checklist: Patient identified, Emergency Drugs available, Suction available, Patient being monitored and Timeout performed Patient Re-evaluated:Patient Re-evaluated prior to induction Oxygen Delivery Method: Circle system utilized Preoxygenation: Pre-oxygenation with 100% oxygen Induction Type: IV induction Ventilation: Mask ventilation without difficulty and Oral airway inserted - appropriate to patient size Laryngoscope Size: Mac and 4 Grade View: Grade I Tube type: Oral Tube size: 7.5 mm Number of attempts: 1 Airway Equipment and Method: Stylet Placement Confirmation: ETT inserted through vocal cords under direct vision,  positive ETCO2 and breath sounds checked- equal and bilateral Secured at: 22 cm Tube secured with: Tape Dental Injury: Teeth and Oropharynx as per pre-operative assessment

## 2019-05-13 NOTE — Op Note (Signed)
ERCP was performed for CBD stone noted on ultrasound..   Findings: 2 separate diverticular opening noted in second portion of the duodenum. After lifting a diverticular fold the ampulla was visible and easily cannulated. Cholangiogram did not reveal any stones. A sphincterotomy was performed. The CBD was swept several times with a 9 mm and 12 mm balloon starting at bifurcation. Nothing was retrieved. Good bile flow was noted.  There was no bleeding noted post sphincterotomy. The pancreatic duct was never cannulated or injected during the entire procedure.   Recommendation: Resume diet as tolerated, low-fat. Okay to resume Brilinta from GI standpoint. As per surgical evaluation, cholecystectomy will be planned more electively likely as an outpatient. If patient is without pain and able to tolerate diet, okay to DC in evening from GI standpoint.   Kerin Salen, MD

## 2019-05-14 LAB — CULTURE, BLOOD (ROUTINE X 2)
Culture: NO GROWTH
Culture: NO GROWTH
Special Requests: ADEQUATE
Special Requests: ADEQUATE

## 2019-05-15 ENCOUNTER — Encounter: Payer: Self-pay | Admitting: *Deleted

## 2019-05-16 ENCOUNTER — Telehealth: Payer: Self-pay | Admitting: *Deleted

## 2019-05-16 NOTE — Telephone Encounter (Signed)
Unable to reach pt. 1st attempt.  

## 2019-05-17 ENCOUNTER — Other Ambulatory Visit: Payer: Self-pay | Admitting: *Deleted

## 2019-05-17 NOTE — Telephone Encounter (Signed)
Unable to reach pt. 2nd attempt. Pt currently has physical scheduled for 05/19/19.

## 2019-05-17 NOTE — Patient Outreach (Signed)
[facility]) Care Management  05/17/2019  Valgene Deloatch Aug 16, 1950 [id number]  Referral Received: 3/15 Initial Outreach: 3/16 Primary provider completed transition of care call  RN spoke with the pt and introduce [facility] services and the purpose for today's call due to his recent discharge. Explained the recent referral to initiate services. Pt further explains his [facility] that will [facility] on Friday this week and a virtual primary provider's visit scheduled 3/18.  RN further inquired on pt's medical issues related to his HTN and COPD. Pt states all his "good and steady" with no major issues. RN further offered to assist with pt with monthly or quarterly services for prevention measures under [facility] services if further assistance if needed with [facility] pharmacy or social worker for any community resources as he continue to manage his care. Pt very appreciative but opt to decline all services at this time with [facility]. RN offered to assist any way based upon his options for services however pt has requested to contact [facility] through his provider if needed in the future. RN has offered to send [facility] information however pt declined, again.   Plan: Will closed based upon pt's request and notify the provider.  Elliot Cousin, RN Care Management Coordinator [facility] Main Office [phone]

## 2019-05-18 ENCOUNTER — Other Ambulatory Visit: Payer: Self-pay

## 2019-05-18 ENCOUNTER — Telehealth: Payer: Self-pay

## 2019-05-18 NOTE — Telephone Encounter (Signed)
Called patient to move to appointment from Friday 03/19 to Monday 03/22. Advised patient to call back to reschedule.

## 2019-05-18 NOTE — Progress Notes (Addendum)
[facility] [address] [place], [place] [address] [phone]  Date:  05/19/2019   Name:  Hunter Mcmillan   DOB:  1950/09/24   MRN:  [id number]  PCP:  [name], MD    Chief Complaint: Annual Exam   History of Present Illness:  Hunter Mcmillan is a 69 y.o. very pleasant male patient who presents with the following:  Here today for a hospital follow-up [place] recently seen by myself in October 2020 History of CAD, hepatitis C post Harvoni treatment He was recently admitted to the hospital actually 3 times February 21 to February 23 with STEMI, stenting x2 February 27 to March 2 with abdominal pain and elevated LFTs March 8 to March 12 with choledocholithiasis  He underwent an ERCP with no gallstones found, surgery plans to do an elective cholecystectomy if necessary  Per most recent discharge summary  1.  Acute cholecystitis, with choledocholithiasis complicated by sepsis, and organ failure hypotension.  Patient was admitted to the telemetry ward, he received broad-spectrum antibiotic therapy and intravenous fluids with good toleration.  Patient regained hemodynamic stability, ticagrelor was discontinued and patient was placed on tirofiban with good toleration.  Patient underwent ERCP, no stones were found.  Consulted by surgery who will follow up as an outpatient for possible elective cholecystectomy.  At discharge his white cell count is 5.4 and his cultures have been no growth. 2.  Coronary disease status post angioplasty and stent placement 04/24/2019/ LV EF 40 to 45%.  Patient had 2 severe vessel disease, 100% proximal RCA and 90% left circumflex.  He received PCI to the RCA with thrombectomy and drug-eluting stent x2.  Chronic inferior lateral ischemic changes on EKG.  No acute chest pain during this hospitalization.  Patient was placed on glycoprotein 2b3a inhibition (antiplatelet therapy) bridge with  trofiban, with no major complications. Postprocedure patient has been resumed on ticagrelor. Patient has been placed on carvedilol per cardiology recommendations for transient trigeminy and ectopy noted on cardiac monitoring.  Patient will continue dual antiplatelet therapy for minimum of 1 year following acute coronary syndrome 3.  Dyslipidemia.  Continue statin therapy. 4.  COPD.  No signs of acute exacerbation, continue bronchodilators and inhaled corticosteroids. 5.  Tobacco abuse.  Continue smoking cessation counseling. 6.  Acute kidney injury with nongap metabolic acidosis.  Patient received supportive medical therapy including intravenous fluids, his peak creatinine reached 1.32, at discharge is down to 0.93, potassium is 4.1 and serum bicarbonate 23.  He will need a CMP, CBC today He does not have a general surgery appt yet  He is seeing cardiology tomorrow however  He is eating ok No vomiting No fever noted  No loss of taste or smell   He does notice a cough which is chronic- due to COPD. This is not a new thing He is working on quitting smoking but it not there yet- he is down to 2 cigs a day which is  He also notes a mild ST this week- he thinks due to riding his motorcycle over the weekend with no mask.  Today his throat feels much better- slightly sore on the left only  He notes that he is feeling less fatigued, breathing better. He may get SOB at the end of 15 minutes of exercise- prior to his stent he could exercise "none"  He is getting his covid vaccine tomorrow at [facility]  Patient Active Problem List   Diagnosis  Date Noted  . Acute cholecystitis 05/09/2019  . Cholelithiases 04/30/2019  . Cholelithiasis 04/30/2019  . RUQ abdominal pain   . Presence of drug-eluting stent in right coronary artery 04/25/2019  . Hyperlipidemia with target LDL less than 70 04/25/2019  . Acute inferior STEMI 04/25/2019  . STEMI involving right coronary artery ([place]) 04/24/2019  .  Abdominal aortic aneurysm (AAA) 3.0 cm to 5.5 cm in diameter in male ([place]) 01/06/2017  . Essential hypertension 04/09/2015  . Hyperpigmentation of skin 10/05/2014  . COPD (chronic obstructive pulmonary disease) ([place]) 06/26/2014  . Sacroiliac joint dysfunction of right side 06/26/2014  . Alcoholism ([place])   . Cocaine abuse ([place]) 11/18/2013  . Scrotal mass 07/21/2013  . Cirrhosis ([place]) 05/28/2013  . Routine general medical examination at a health care facility 07/19/2012  . Abnormal TSH 04/09/2012  . Coronary artery disease involving native heart with unstable angina pectoris ([place]) 01/14/2012  . Hx of non-ST elevation myocardial infarction (NSTEMI) 01/05/2012  . Tobacco abuse 01/05/2012  . Chronic hepatitis C without hepatic coma ([place]) 01/05/2012  . Depression, recurrent ([place]) 01/05/2012  . Thrombocytopenia ([place]) 01/05/2012    Past Medical History:  Diagnosis Date  . Abdominal pain 05/02/2019  . Alcoholism ([place])   . CAD (coronary artery disease)    a. 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 Chicken pox   . Depression   . ETOH abuse   . Genital warts   . Hepatic steatosis   . Hepatitis C    a. s/p [name] ~ 2003 in [place], [place] => stopped after 6 mos due to neutropenia - viral load noted to be low at that time;  no follow up since;   b. abdominal u/s 11/13:  diffuse hepatic steatosis and/or hepatocellular disease    . Hx of echocardiogram    a. Echo 01/06/12: Mild focal basal hypertrophy the septum, vigorous LV, EF 65-70%, Gr 2 diast dysfn, trivial AI, MAC, trivial MR, trivial TR, PASP 36  . Hyperlipidemia   . Hypertension   . Non-STEMI (non-ST elevated myocardial infarction)  ([place])   . Stroke [facility])    "minor" pt reported  . Thrombocytopenia ([place])   . Tobacco abuse     Past Surgical History:  Procedure Laterality Date  . CORONARY ANGIOGRAPHY N/A 04/24/2019   Procedure: CORONARY ANGIOGRAPHY;  Surgeon: Martinique, Peter M, MD;  Location: [facility];  Service: Cardiovascular;  Laterality: N/A;  . CORONARY/GRAFT ACUTE MI REVASCULARIZATION N/A 04/24/2019   Procedure: Coronary/Graft Acute MI Revascularization;  Surgeon: Martinique, Peter M, MD;  Location: [facility];  Service: Cardiovascular;  Laterality: N/A;  . ERCP N/A 05/13/2019   Procedure: ENDOSCOPIC RETROGRADE CHOLANGIOPANCREATOGRAPHY (ERCP);  Surgeon: Ronnette Juniper, MD;  Location: [facility];  Service: Gastroenterology;  Laterality: N/A;  . EYE SURGERY  1954   corrective surgery for crossed eyes  . EYE SURGERY  1959  . LEFT HEART CATH AND CORONARY ANGIOGRAPHY N/A 04/24/2019   Procedure: LEFT HEART CATH AND CORONARY ANGIOGRAPHY;  Surgeon: Martinique, Peter M, MD;  Location: [facility];  Service: Cardiovascular;  Laterality: N/A;  . LEFT HEART CATHETERIZATION WITH CORONARY ANGIOGRAM N/A 01/05/2012  Procedure: LEFT HEART CATHETERIZATION WITH CORONARY ANGIOGRAM;  Surgeon: Larey Dresser, MD;  Location: [facility] CATH LAB;  Service: Cardiovascular;  Laterality: N/A;  . REMOVAL OF STONES  05/13/2019   Procedure: REMOVAL OF STONES;  Surgeon: Ronnette Juniper, MD;  Location: [facility];  Service: Gastroenterology;;  . [name]  05/13/2019   Procedure: [name];  Surgeon: Ronnette Juniper, MD;  Location: [facility] ENDOSCOPY;  Service: Gastroenterology;;    Social History   Tobacco Use  . Smoking status: Light Tobacco Smoker    Packs/day: 0.10    Types: Cigarettes  . Smokeless tobacco: Never Used  . Tobacco comment: almost quit, very rare, 1-2 daily  Substance Use Topics  . Alcohol use: Not Currently    Alcohol/week: 0.0 standard drinks    Comment: Last drink: 10 days ago   . Drug use: Not Currently    Types:  Cocaine    Comment: history of cocaine last use 2014    Family History  Problem Relation Age of Onset  . Heart attack Mother 67  . Diabetes Mother   . Heart disease Mother   . Hyperlipidemia Father   . Hypertension Father   . Diabetes Maternal Grandfather   . Heart disease Maternal Grandfather   . Early death Maternal Grandfather   . Heart attack Maternal Grandfather   . Hearing loss Sister   . Hyperlipidemia Sister   . Depression Brother   . Diabetes Brother   . Hyperlipidemia Brother   . Early death Maternal Grandmother   . Heart disease Maternal Grandmother   . Heart attack Maternal Grandmother   . Hearing loss Sister     No Known Allergies  Medication list has been reviewed and updated.  Current Outpatient Medications on File Prior to Visit  Medication Sig Dispense Refill  . acetaminophen (TYLENOL) 500 MG tablet Take 1 tablet (500 mg total) by mouth every 6 (six) hours as needed for mild pain, fever or headache. 20 tablet 0  . aspirin EC 81 MG tablet Take 81 mg by mouth daily.    Marland Kitchen atorvastatin (LIPITOR) 80 MG tablet Take 1 tablet (80 mg total) by mouth daily at 6 PM. 30 tablet 6  . cyclobenzaprine (FLEXERIL) 10 MG tablet TAKE 1 TABLET BY MOUTH TWICE DAILY AS NEEDED FOR MUSCLE SPASM (Patient taking differently: Take 10 mg by mouth daily as needed for muscle spasms. ) 30 tablet 3  . fluticasone (FLOVENT HFA) 110 MCG/ACT inhaler Inhale 2 puffs into the lungs 2 (two) times daily. 1 Inhaler 3  . Multiple Vitamin (MULTIVITAMIN WITH MINERALS) TABS tablet Take 1 tablet by mouth daily.    . nitroGLYCERIN (NITROSTAT) 0.4 MG SL tablet Place 1 tablet (0.4 mg total) under the tongue every 5 (five) minutes as needed for chest pain. 25 tablet 1  . ticagrelor (BRILINTA) 90 MG TABS tablet Take 1 tablet (90 mg total) by mouth 2 (two) times daily. 60 tablet 11   No current facility-administered medications on file prior to visit.    Review of Systems:  As per HPI- otherwise  negative.   Physical Examination: Vitals:   05/19/19 1107 05/19/19 1125  BP: 91/61 110/78  Pulse: (!) 102 90  Resp: 16   Temp: (!) 95.4 F (35.2 C)   SpO2: 96%    Vitals:   05/19/19 1107  Weight: 144 lb (65.3 kg)  Height: _0  (1.803 m)   Body mass index is 20.08 kg/m. Ideal Body Weight: Weight in (lb) to have BMI = 25: 178.9  GEN: no acute distress.  Slim build, looks well  HEENT: Atraumatic, Normocephalic.   Bilateral TM wnl, oropharynx normal.  PEERL,EOMI.   Ears and Nose: No external deformity. CV: RRR, No M/G/R. No JVD. No thrill. No extra heart sounds. PULM: CTA B, no wheezes, crackles, rhonchi. No retractions. No resp. distress. No accessory muscle use. ABD: S, NT, ND, +BS. No rebound. No HSM.  Belly is benign today EXTR: No c/c/e PSYCH: Normally interactive. Conversant.    BP Readings from Last 3 Encounters:  05/23/19 126/78  05/19/19 110/78  05/13/19 107/69   Pulse Readings from Last 3 Encounters:  05/23/19 (!) 42  05/19/19 90  05/13/19 73   He notes that his SBP is generally around 100  Assessment and Plan: Hospital discharge follow-up - Plan: CBC, Comprehensive metabolic panel  Essential hypertension  Chronic obstructive pulmonary disease, unspecified COPD type ([place])  ST elevation myocardial infarction (STEMI), unspecified artery ([place])  Choledocholithiasis  Hospital follow-up; he has been admitted 3x recently for STEMI and then most recently for choledocholithiasis.  Elective cholecystectomy will most likely be done in the future He is overall doing much better today- no acute concerns Planned to see him in 3 months He is seeing cardiology tomorrow to follow-up on recent STEMI and stent I have orders for future lab work, I presume cardiology may want some lab work as well so we will defer blood draw until tomorrow This visit occurred during the SARS-CoV-2 public health emergency.  Safety protocols were in place, including screening questions  prior to the visit, additional usage of staff PPE, and extensive cleaning of exam room while observing appropriate contact time as indicated for disinfecting solutions.    Signed Lamar Blinks, MD

## 2019-05-19 ENCOUNTER — Other Ambulatory Visit: Payer: Self-pay

## 2019-05-19 ENCOUNTER — Encounter: Payer: Self-pay | Admitting: Family Medicine

## 2019-05-19 ENCOUNTER — Ambulatory Visit (INDEPENDENT_AMBULATORY_CARE_PROVIDER_SITE_OTHER): Payer: Medicare Other | Admitting: Family Medicine

## 2019-05-19 VITALS — BP 110/78 | HR 90 | Temp 95.4°F | Resp 16 | Ht 71.0 in | Wt 144.0 lb

## 2019-05-19 DIAGNOSIS — Z09 Encounter for follow-up examination after completed treatment for conditions other than malignant neoplasm: Secondary | ICD-10-CM

## 2019-05-19 DIAGNOSIS — I10 Essential (primary) hypertension: Secondary | ICD-10-CM

## 2019-05-19 DIAGNOSIS — I213 ST elevation (STEMI) myocardial infarction of unspecified site: Secondary | ICD-10-CM

## 2019-05-19 DIAGNOSIS — K8050 Calculus of bile duct without cholangitis or cholecystitis without obstruction: Secondary | ICD-10-CM

## 2019-05-19 DIAGNOSIS — J449 Chronic obstructive pulmonary disease, unspecified: Secondary | ICD-10-CM

## 2019-05-19 MED ORDER — ALBUTEROL SULFATE HFA 108 (90 BASE) MCG/ACT IN AERS: 2.0000 | INHALATION_SPRAY | Freq: Four times a day (QID) | RESPIRATORY_TRACT | 2 refills | Status: DC | PRN

## 2019-05-19 MED ORDER — ALBUTEROL SULFATE HFA 108 (90 BASE) MCG/ACT IN AERS: 2.0000 | INHALATION_SPRAY | Freq: Four times a day (QID) | RESPIRATORY_TRACT | 6 refills | Status: DC | PRN

## 2019-05-19 NOTE — Patient Instructions (Addendum)
It was good to see you doing well today!   I ordered labs for you to have done tomorrow when you are at cardiology- this way you won't have to be stuck twice if they have any other labs they need as well   Please see me in about 3 months to check in

## 2019-05-19 NOTE — Addendum Note (Signed)
Addended by: Abbe Amsterdam C on: 05/19/2019 04:41 PM   Modules accepted: Orders

## 2019-05-20 ENCOUNTER — Ambulatory Visit: Payer: Medicare Other | Admitting: Student

## 2019-05-20 DIAGNOSIS — Z09 Encounter for follow-up examination after completed treatment for conditions other than malignant neoplasm: Secondary | ICD-10-CM | POA: Diagnosis not present

## 2019-05-23 ENCOUNTER — Ambulatory Visit: Payer: Medicare Other | Admitting: Physician Assistant

## 2019-05-23 ENCOUNTER — Encounter: Payer: Self-pay | Admitting: Physician Assistant

## 2019-05-23 ENCOUNTER — Other Ambulatory Visit: Payer: Self-pay

## 2019-05-23 VITALS — BP 126/78 | HR 42 | Temp 96.6°F | Ht 71.0 in | Wt 146.8 lb

## 2019-05-23 DIAGNOSIS — Z8673 Personal history of transient ischemic attack (TIA), and cerebral infarction without residual deficits: Secondary | ICD-10-CM

## 2019-05-23 DIAGNOSIS — I2510 Atherosclerotic heart disease of native coronary artery without angina pectoris: Secondary | ICD-10-CM | POA: Diagnosis not present

## 2019-05-23 DIAGNOSIS — Z720 Tobacco use: Secondary | ICD-10-CM | POA: Diagnosis not present

## 2019-05-23 DIAGNOSIS — I319 Disease of pericardium, unspecified: Secondary | ICD-10-CM

## 2019-05-23 DIAGNOSIS — I10 Essential (primary) hypertension: Secondary | ICD-10-CM

## 2019-05-23 DIAGNOSIS — E785 Hyperlipidemia, unspecified: Secondary | ICD-10-CM | POA: Diagnosis not present

## 2019-05-23 DIAGNOSIS — I255 Ischemic cardiomyopathy: Secondary | ICD-10-CM

## 2019-05-23 NOTE — Progress Notes (Signed)
Cardiology Office Note:    Date:  05/23/2019   ID:  Hunter Mcmillan, DOB 03/05/1950, MRN [id number]  PCP:  [name], MD  Cardiologist:  Peter Martinique, MD  Electrophysiologist:  None   Referring MD: [name], MD   Chief Complaint  Patient presents with  . Follow-up    seen for Dr. Martinique.    History of Present Illness:    Hunter Mcmillan is a 69 y.o. male with a hx of CAD s/p DES x2 to RCA, HTN, HLD, h/o CVA, EtOH abuse, tobacco abuse and COPD.  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 More recently, patient was admitted for STEMI in February 2021. Cardiac catheterization revealed 100% occluded proximal RCA treated with 2 drug-eluting stents, and heavy thrombus in the left circumflex.  Left circumflex disease was managed medically.  Postprocedure, he was placed on aspirin and Brilinta.  Echocardiogram showed EF of 40 to 45% with grade 1 DD.  Postprocedure, he had recurrent chest pain, however relook cardiac catheterization showed patent stents.  He was started on colchicine for possible pericarditis.  Since then, patient was readmitted to the hospital on 04/30/2019 with abdominal pain.  He had elevated LFT on admission.  He also had a heme positive stool.  GI service recommended Protonix therapy.  HIDA scan was nondiagnostic.  MRCP was unremarkable.  Symptoms consistent with cholelithiasis.  In anticipation of ERCP, Brilinta was temporarily held and that the patient was placed on Aggrastat for bridge.  He underwent a ERCP on 05/13/2019, sphincterotomy was performed, cholangiogram did not reveal any stones.  Common bile duct was swept several times, good bile flow was noted.  Liver enzymes trended down after the procedure.  During this hospitalization, patient was started on carvedilol, previous metoprolol succinate was  discontinued.  Patient presents today for cardiology office visit.  He denies any chest pain or shortness of breath.  His abdominal pain has completely resolved.  He says in the next several months, he may consider a gallbladder removal eventually.  I recommended to delay any nonurgent cases to at least for 6 months, ideally longer than 1 year due to the need to be compliant with dual antiplatelet therapy.  Any future surgery while the patient is on dual antiplatelet therapy will need clearance by his cardiologist before [place] can be held.  Initial heart rate was in the 40s by automatic blood pressure cuff, however when we got EKG to confirm, his heart rate is actually in the 70s.  He denies any dizziness, blurred vision or feeling of passing out.  I recommended continue on the current therapy for the time being.  He can follow-up with Dr. Martinique in 3 months.  If blood pressure remains stable, may consider addition of ACE inhibitor or ARB.  He was only given 10 tablets of colchicine, he has finished the course by this point.  Since he is no longer having chest pain, I will remove colchicine from his medication list.   Past Medical History:  Diagnosis Date  . Abdominal pain 05/02/2019  . Alcoholism ([place])   . CAD (coronary artery disease)    a. s/p NSTEMI 11/13:  LHC 01/05/12: oLAD 20%, pLAD 30%, mLAD 30%, CFX with anomalous origin from the pRCA-small caliber vessel with a hazy 95% proximal stenosis, pRCA 30%, mRCA 30%, EF 60-65%.=> CFX small vessel and Med  Rx recommended  b cath 04/24/19:  V CAD => 100% m-dRCA - overlaping DES PCI; prox anomalous LCx stable 90% - med Rx  . Chicken pox   . Depression   . ETOH abuse   . Genital warts   . Hepatic steatosis   . Hepatitis C    a. s/p [name] ~ 2003 in [place], [place] => stopped after 6 mos due to neutropenia - viral load noted to be low at that time;  no follow up since;   b. abdominal u/s 11/13:  diffuse hepatic steatosis and/or  hepatocellular disease    . Hx of echocardiogram    a. Echo 01/06/12: Mild focal basal hypertrophy the septum, vigorous LV, EF 65-70%, Gr 2 diast dysfn, trivial AI, MAC, trivial MR, trivial TR, PASP 36  . Hyperlipidemia   . Hypertension   . Non-STEMI (non-ST elevated myocardial infarction) ([place])   . Stroke [facility])    "minor" pt reported  . Thrombocytopenia ([place])   . Tobacco abuse     Past Surgical History:  Procedure Laterality Date  . CORONARY ANGIOGRAPHY N/A 04/24/2019   Procedure: CORONARY ANGIOGRAPHY;  Surgeon: Martinique, Peter M, MD;  Location: [facility];  Service: Cardiovascular;  Laterality: N/A;  . CORONARY/GRAFT ACUTE MI REVASCULARIZATION N/A 04/24/2019   Procedure: Coronary/Graft Acute MI Revascularization;  Surgeon: Martinique, Peter M, MD;  Location: [facility];  Service: Cardiovascular;  Laterality: N/A;  . ERCP N/A 05/13/2019   Procedure: ENDOSCOPIC RETROGRADE CHOLANGIOPANCREATOGRAPHY (ERCP);  Surgeon: Ronnette Juniper, MD;  Location: [facility];  Service: Gastroenterology;  Laterality: N/A;  . EYE SURGERY  1954   corrective surgery for crossed eyes  . EYE SURGERY  1959  . LEFT HEART CATH AND CORONARY ANGIOGRAPHY N/A 04/24/2019   Procedure: LEFT HEART CATH AND CORONARY ANGIOGRAPHY;  Surgeon: Martinique, Peter M, MD;  Location: [facility];  Service: Cardiovascular;  Laterality: N/A;  . LEFT HEART CATHETERIZATION WITH CORONARY ANGIOGRAM N/A 01/05/2012   Procedure: LEFT HEART CATHETERIZATION WITH CORONARY ANGIOGRAM;  Surgeon: Larey Dresser, MD;  Location: [facility] CATH LAB;  Service: Cardiovascular;  Laterality: N/A;  . REMOVAL OF STONES  05/13/2019   Procedure: REMOVAL OF STONES;  Surgeon: Ronnette Juniper, MD;  Location: [facility];  Service: Gastroenterology;;  . [name]  05/13/2019   Procedure: SPHINCTEROTOMY;  Surgeon: Ronnette Juniper, MD;  Location: [facility] ENDOSCOPY;  Service: Gastroenterology;;    Current Medications: Current Meds  Medication Sig  . acetaminophen  (TYLENOL) 500 MG tablet Take 1 tablet (500 mg total) by mouth every 6 (six) hours as needed for mild pain, fever or headache.  . albuterol (PROAIR HFA) 108 (90 Base) MCG/ACT inhaler Inhale 2 puffs into the lungs every 6 (six) hours as needed for wheezing or shortness of breath.  Marland Kitchen albuterol (VENTOLIN HFA) 108 (90 Base) MCG/ACT inhaler Inhale 2 puffs into the lungs every 6 (six) hours as needed for wheezing or shortness of breath.  Marland Kitchen aspirin EC 81 MG tablet Take 81 mg by mouth daily.  Marland Kitchen atorvastatin (LIPITOR) 80 MG tablet Take 1 tablet (80 mg total) by mouth daily at 6 PM.  . carvedilol (COREG) 3.125 MG tablet Take 1 tablet (3.125 mg total) by mouth 2 (two) times daily with a meal.  . cyclobenzaprine (FLEXERIL) 10 MG tablet TAKE 1 TABLET BY MOUTH TWICE DAILY AS NEEDED FOR MUSCLE SPASM (Patient taking differently: Take 10 mg by mouth daily as needed for muscle spasms. )  . fluticasone (FLOVENT HFA) 110  MCG/ACT inhaler Inhale 2 puffs into the lungs 2 (two) times daily.  . Multiple Vitamin (MULTIVITAMIN WITH MINERALS) TABS tablet Take 1 tablet by mouth daily.  . nitroGLYCERIN (NITROSTAT) 0.4 MG SL tablet Place 1 tablet (0.4 mg total) under the tongue every 5 (five) minutes as needed for chest pain.  . ticagrelor (BRILINTA) 90 MG TABS tablet Take 1 tablet (90 mg total) by mouth 2 (two) times daily.  . [DISCONTINUED] colchicine 0.6 MG tablet Take 1 tablet (0.6 mg total) by mouth daily.     Allergies:   Patient has no known allergies.   Social History   Socioeconomic History  . Marital status: Single    Spouse name: Not on file  . Number of children: 1  . Years of education: Not on file  . Highest education level: Bachelor's degree (e.g., BA, AB, BS)  Occupational History  . Occupation: Music therapist: [employer]    Comment: retired  Tobacco Use  . Smoking status: Light Tobacco Smoker    Packs/day: 0.10    Types: Cigarettes  . Smokeless tobacco: Never Used  . Tobacco comment:  almost quit, very rare, 1-2 daily  Substance and Sexual Activity  . Alcohol use: Not Currently    Alcohol/week: 0.0 standard drinks    Comment: Last drink: 10 days ago   . Drug use: Not Currently    Types: Cocaine    Comment: history of cocaine last use 2014  . Sexual activity: Yes    Partners: Female    Comment: pt. declined condoms  Other Topics Concern  . Not on file  Social History Narrative  . Not on file   Social Determinants of Health   Financial Resource Strain:   . Difficulty of Paying Living Expenses:   Food Insecurity:   . Worried About Charity fundraiser in the Last Year:   . Arboriculturist in the Last Year:   Transportation Needs:   . Film/video editor (Medical):   Marland Kitchen Lack of Transportation (Non-Medical):   Physical Activity:   . Days of Exercise per Week:   . Minutes of Exercise per Session:   Stress:   . Feeling of Stress :   Social Connections:   . Frequency of Communication with Friends and Family:   . Frequency of Social Gatherings with Friends and Family:   . Attends Religious Services:   . Active Member of Clubs or Organizations:   . Attends Archivist Meetings:   Marland Kitchen Marital Status:      Family History: The patient's family history includes Depression in his brother; Diabetes in his brother, maternal grandfather, and mother; Early death in his maternal grandfather and maternal grandmother; Hearing loss in his sister and sister; Heart attack in his maternal grandfather and maternal grandmother; Heart attack (age of onset: 55) in his mother; Heart disease in his maternal grandfather, maternal grandmother, and mother; Hyperlipidemia in his brother, father, and sister; Hypertension in his father.  ROS:   Please see the history of present illness.     All other systems reviewed and are negative.  EKGs/Labs/Other Studies Reviewed:    The following studies were reviewed today:  Echo 04/24/2019 1. Left ventricular ejection fraction, by  estimation, is 40 to 45%. The  left ventricle has mildly decreased function. The left ventricle  demonstrates regional wall motion abnormalities (see scoring  diagram/findings for description). There is mild left  ventricular hypertrophy. Left ventricular diastolic parameters are  consistent  with Grade I diastolic dysfunction (impaired relaxation).  2. Right ventricular systolic function is severely reduced. The right  ventricular size is mildly enlarged. There is mildly elevated pulmonary  artery systolic pressure.  3. The mitral valve is grossly normal. Trivial mitral valve  regurgitation.  4. Tricuspid valve regurgitation is mild to moderate.  5. The aortic valve has an indeterminant number of cusps. Aortic valve  regurgitation is not visualized.  6. The inferior vena cava is dilated in size with <50% respiratory  variability, suggesting right atrial pressure of 15 mmHg.    Cath 04/24/2019  Prox Cx lesion is 90% stenosed.  Previously placed Prox RCA to Mid RCA stent (unknown type) is widely patent.   1. Continued excellent patency of the RCA stents. The LCx stenosis is chronic and this is a small vessel.   Plan: continue medical management. Will remove femoral sheath manually. Will add colchicine for possible pericardial component to pain. Prn analgesics.     EKG:  EKG is ordered today.  The ekg ordered today demonstrates normal sinus rhythm with T wave inversion in the inferior leads.  Recent Labs: 04/25/2019: Magnesium 1.8 05/13/2019: ALT 46; BUN 10; Creatinine, Ser 0.93; Hemoglobin 11.9; Platelets 233; Potassium 4.1; Sodium 136  Recent Lipid Panel    Component Value Date/Time   CHOL 94 04/26/2019 0314   TRIG 63 04/26/2019 0314   HDL 16 (L) 04/26/2019 0314   CHOLHDL 5.9 04/26/2019 0314   VLDL 13 04/26/2019 0314   LDLCALC 65 04/26/2019 0314    Physical Exam:    VS:  BP 126/78   Pulse (!) 42   Temp (!) 96.6 F (35.9 C)   Ht _0  (1.803 m)   Wt 146 lb 12.8  oz (66.6 kg)   SpO2 100%   BMI 20.47 kg/m     Wt Readings from Last 3 Encounters:  05/23/19 146 lb 12.8 oz (66.6 kg)  05/19/19 144 lb (65.3 kg)  05/13/19 154 lb 5.2 oz (70 kg)     GEN:  Well nourished, well developed in no acute distress HEENT: Normal NECK: No JVD; No carotid bruits LYMPHATICS: No lymphadenopathy CARDIAC: RRR, no murmurs, rubs, gallops RESPIRATORY:  Clear to auscultation without rales, wheezing or rhonchi  ABDOMEN: Soft, non-tender, non-distended MUSCULOSKELETAL:  No edema; No deformity  SKIN: Warm and dry NEUROLOGIC:  Alert and oriented x 3 PSYCHIATRIC:  Normal affect   ASSESSMENT:    1. Coronary artery disease involving native coronary artery of native heart without angina pectoris   2. Essential hypertension   3. Hyperlipidemia with target LDL less than 70   4. H/O: CVA (cerebrovascular accident)   5. Tobacco abuse   6. Ischemic cardiomyopathy   7. Pericarditis, unspecified chronicity, unspecified type    PLAN:    In order of problems listed above:  1. CAD: Recently underwent DES x2 to RCA.  Continue aspirin and Brilinta.  Emphasized on the importance of dual antiplatelet therapy.  On initial arrival, his heart rate was 42 by automatic blood pressure cuff, however EKG demonstrated his heart rate is actually in the 70s.  He does not have any significant symptom associated with bradycardia and I have not seen significant ventricular ectopy either.  We will continue to monitor for now.  2. Ischemic cardiomyopathy: EF 40 to 45%.  Continue carvedilol.  Euvolemic on physical exam  3. Pericarditis: He was placed on colchicine for presumed pericarditis.  Since chest pain has completely resolved, I will discontinue colchicine.  Prior  to today's visit, he has already ran out of colchicine as he was only given 10 tablets.  4. Hypertension: Blood pressure stable on current therapy.  5. Hyperlipidemia: On Lipitor  6. Tobacco abuse: He has tried to cut back and to  currently smoke about 3 to 4 cigarettes/day.  I encouraged him to continue to try to quit tobacco altogether  7. history of CVA: No recent recurrence   Medication Adjustments/Labs and Tests Ordered: Current medicines are reviewed at length with the patient today.  Concerns regarding medicines are outlined above.  Orders Placed This Encounter  Procedures  . EKG 12-Lead   No orders of the defined types were placed in this encounter.   Patient Instructions  Medication Instructions:   STOP Colchicine  Your physician recommends that you continue on your current medications as directed. Please refer to the Current Medication list given to you today.  *If you need a refill on your cardiac medications before your next appointment, please call your pharmacy*  Lab Work: NONE ordered at this time of appointment   If you have labs (blood work) drawn today and your tests are completely normal, you will receive your results only by: Marland Kitchen MyChart Message (if you have MyChart) OR . A paper copy in the mail If you have any lab test that is abnormal or we need to change your treatment, we will call you to review the results.  Testing/Procedures: NONE ordered at this time of appointment   Follow-Up: At [facility], you and your health needs are our priority.  As part of our continuing mission to provide you with exceptional heart care, we have created designated Provider Care Teams.  These Care Teams include your primary Cardiologist (physician) and Advanced Practice Providers (APPs -  Physician Assistants and Nurse Practitioners) who all work together to provide you with the care you need, when you need it.  We recommend signing up for the patient portal called "MyChart".  Sign up information is provided on this After Visit Summary.  MyChart is used to connect with patients for Virtual Visits (Telemedicine).  Patients are able to view lab/test results, encounter notes, upcoming appointments, etc.   Non-urgent messages can be sent to your provider as well.   To learn more about what you can do with MyChart, go to [email or web address].    Your next appointment:   3 month(s)  The format for your next appointment:   Either In Person or Virtual  Provider:   Peter Martinique, MD  Other Instructions    Smoking Tobacco Information, Adult Smoking tobacco can be harmful to your health. Tobacco contains a poisonous (toxic), colorless chemical called nicotine. Nicotine is addictive. It changes the brain and can make it hard to stop smoking. Tobacco also has other toxic chemicals that can hurt your body and raise your risk of many cancers. How can smoking tobacco affect me? Smoking tobacco puts you at risk for:  Cancer. Smoking is most commonly associated with lung cancer, but can also lead to cancer in other parts of the body.  Chronic obstructive pulmonary disease (COPD). This is a long-term lung condition that makes it hard to breathe. It also gets worse over time.  High blood pressure (hypertension), heart disease, stroke, or heart attack.  Lung infections, such as pneumonia.  Cataracts. This is when the lenses in the eyes become clouded.  Digestive problems. This may include peptic ulcers, heartburn, and gastroesophageal reflux disease (GERD).  Oral health problems, such as gum disease  and tooth loss.  Loss of taste and smell. Smoking can affect your appearance by causing:  Wrinkles.  Yellow or stained teeth, fingers, and fingernails. Smoking tobacco can also affect your social life, because:  It may be challenging to find places to smoke when away from home. Many workplaces, [employer], hotels, and public places are tobacco-free.  Smoking is expensive. This is due to the cost of tobacco and the long-term costs of treating health problems from smoking.  Secondhand smoke may affect those around you. Secondhand smoke can cause lung cancer, breathing problems, and heart  disease. Children of smokers have a higher risk for: ? Sudden infant death syndrome (SIDS). ? Ear infections. ? Lung infections. If you currently smoke tobacco, quitting now can help you:  Lead a longer and healthier life.  Look, smell, breathe, and feel better over time.  Save money.  Protect others from the harms of secondhand smoke. What actions can I take to prevent health problems? Quit smoking   Do not start smoking. Quit if you already do.  Make a plan to quit smoking and commit to it. Look for programs to help you and ask your health care provider for recommendations and ideas.  Set a date and write down all the reasons you want to quit.  Let your friends and family know you are quitting so they can help and support you. Consider finding friends who also want to quit. It can be easier to quit with someone else, so that you can support each other.  Talk with your health care provider about using nicotine replacement medicines to help you quit, such as gum, lozenges, patches, sprays, or pills.  Do not replace cigarette smoking with electronic cigarettes, which are commonly called e-cigarettes. The safety of e-cigarettes is not known, and some may contain harmful chemicals.  If you try to quit but return to smoking, stay positive. It is common to slip up when you first quit, so take it one day at a time.  Be prepared for cravings. When you feel the urge to smoke, chew gum or suck on hard candy. Lifestyle  Stay busy and take care of your body.  Drink enough fluid to keep your urine pale yellow.  Get plenty of exercise and eat a healthy diet. This can help prevent weight gain after quitting.  Monitor your eating habits. Quitting smoking can cause you to have a larger appetite than when you smoke.  Find ways to relax. Go out with friends or family to a movie or a restaurant where people do not smoke.  Ask your health care provider about having regular tests (screenings)  to check for cancer. This may include blood tests, imaging tests, and other tests.  Find ways to manage your stress, such as meditation, yoga, or exercise. Where to find support To get support to quit smoking, consider:  Asking your health care provider for more information and resources.  Taking classes to learn more about quitting smoking.  Looking for local organizations that offer resources about quitting smoking.  Joining a support group for people who want to quit smoking in your local community.  Calling the smokefree.gov counselor helpline: 1-800-Quit-Now (804) 507-2594) Where to find more information You may find more information about quitting smoking from:  HelpGuide.org: www.helpguide.org  https://hall.com/: smokefree.gov  American Lung Association: www.lung.org Contact a health care provider if you:  Have problems breathing.  Notice that your lips, nose, or fingers turn blue.  Have chest pain.  Are coughing up  blood.  Feel faint or you pass out.  Have other health changes that cause you to worry. Summary  Smoking tobacco can negatively affect your health, the health of those around you, your finances, and your social life.  Do not start smoking. Quit if you already do. If you need help quitting, ask your health care provider.  Think about joining a support group for people who want to quit smoking in your local community. There are many effective programs that will help you to quit this behavior. This information is not intended to replace advice given to you by your health care provider. Make sure you discuss any questions you have with your health care provider. Document Revised: 11/12/2018 Document Reviewed: 03/04/2016 Elsevier Patient Education  2020 [place], [place], [place]  05/23/2019 3:11 PM    [facility]

## 2019-05-23 NOTE — Patient Instructions (Addendum)
Medication Instructions:   STOP Colchicine  Your physician recommends that you continue on your current medications as directed. Please refer to the Current Medication list given to you today.  *If you need a refill on your cardiac medications before your next appointment, please call your pharmacy*  Lab Work: NONE ordered at this time of appointment   If you have labs (blood work) drawn today and your tests are completely normal, you will receive your results only by: Marland Kitchen MyChart Message (if you have MyChart) OR . A paper copy in the mail If you have any lab test that is abnormal or we need to change your treatment, we will call you to review the results.  Testing/Procedures: NONE ordered at this time of appointment   Follow-Up: At [facility], you and your health needs are our priority.  As part of our continuing mission to provide you with exceptional heart care, we have created designated Provider Care Teams.  These Care Teams include your primary Cardiologist (physician) and Advanced Practice Providers (APPs -  Physician Assistants and Nurse Practitioners) who all work together to provide you with the care you need, when you need it.  We recommend signing up for the patient portal called "MyChart".  Sign up information is provided on this After Visit Summary.  MyChart is used to connect with patients for Virtual Visits (Telemedicine).  Patients are able to view lab/test results, encounter notes, upcoming appointments, etc.  Non-urgent messages can be sent to your provider as well.   To learn more about what you can do with MyChart, go to [email or web address].    Your next appointment:   3 month(s)  The format for your next appointment:   Either In Person or Virtual  Provider:   Peter Martinique, MD  Other Instructions    Smoking Tobacco Information, Adult Smoking tobacco can be harmful to your health. Tobacco contains a poisonous (toxic), colorless chemical called nicotine.  Nicotine is addictive. It changes the brain and can make it hard to stop smoking. Tobacco also has other toxic chemicals that can hurt your body and raise your risk of many cancers. How can smoking tobacco affect me? Smoking tobacco puts you at risk for:  Cancer. Smoking is most commonly associated with lung cancer, but can also lead to cancer in other parts of the body.  Chronic obstructive pulmonary disease (COPD). This is a long-term lung condition that makes it hard to breathe. It also gets worse over time.  High blood pressure (hypertension), heart disease, stroke, or heart attack.  Lung infections, such as pneumonia.  Cataracts. This is when the lenses in the eyes become clouded.  Digestive problems. This may include peptic ulcers, heartburn, and gastroesophageal reflux disease (GERD).  Oral health problems, such as gum disease and tooth loss.  Loss of taste and smell. Smoking can affect your appearance by causing:  Wrinkles.  Yellow or stained teeth, fingers, and fingernails. Smoking tobacco can also affect your social life, because:  It may be challenging to find places to smoke when away from home. Many workplaces, [employer], hotels, and public places are tobacco-free.  Smoking is expensive. This is due to the cost of tobacco and the long-term costs of treating health problems from smoking.  Secondhand smoke may affect those around you. Secondhand smoke can cause lung cancer, breathing problems, and heart disease. Children of smokers have a higher risk for: ? Sudden infant death syndrome (SIDS). ? Ear infections. ? Lung infections. If you currently smoke tobacco,  quitting now can help you:  Lead a longer and healthier life.  Look, smell, breathe, and feel better over time.  Save money.  Protect others from the harms of secondhand smoke. What actions can I take to prevent health problems? Quit smoking   Do not start smoking. Quit if you already do.  Make a  plan to quit smoking and commit to it. Look for programs to help you and ask your health care provider for recommendations and ideas.  Set a date and write down all the reasons you want to quit.  Let your friends and family know you are quitting so they can help and support you. Consider finding friends who also want to quit. It can be easier to quit with someone else, so that you can support each other.  Talk with your health care provider about using nicotine replacement medicines to help you quit, such as gum, lozenges, patches, sprays, or pills.  Do not replace cigarette smoking with electronic cigarettes, which are commonly called e-cigarettes. The safety of e-cigarettes is not known, and some may contain harmful chemicals.  If you try to quit but return to smoking, stay positive. It is common to slip up when you first quit, so take it one day at a time.  Be prepared for cravings. When you feel the urge to smoke, chew gum or suck on hard candy. Lifestyle  Stay busy and take care of your body.  Drink enough fluid to keep your urine pale yellow.  Get plenty of exercise and eat a healthy diet. This can help prevent weight gain after quitting.  Monitor your eating habits. Quitting smoking can cause you to have a larger appetite than when you smoke.  Find ways to relax. Go out with friends or family to a movie or a restaurant where people do not smoke.  Ask your health care provider about having regular tests (screenings) to check for cancer. This may include blood tests, imaging tests, and other tests.  Find ways to manage your stress, such as meditation, yoga, or exercise. Where to find support To get support to quit smoking, consider:  Asking your health care provider for more information and resources.  Taking classes to learn more about quitting smoking.  Looking for local organizations that offer resources about quitting smoking.  Joining a support group for people who want  to quit smoking in your local community.  Calling the smokefree.gov counselor helpline: 1-800-Quit-Now (510) 505-1898) Where to find more information You may find more information about quitting smoking from:  HelpGuide.org: www.helpguide.org  BankRights.uy: smokefree.gov  American Lung Association: www.lung.org Contact a health care provider if you:  Have problems breathing.  Notice that your lips, nose, or fingers turn blue.  Have chest pain.  Are coughing up blood.  Feel faint or you pass out.  Have other health changes that cause you to worry. Summary  Smoking tobacco can negatively affect your health, the health of those around you, your finances, and your social life.  Do not start smoking. Quit if you already do. If you need help quitting, ask your health care provider.  Think about joining a support group for people who want to quit smoking in your local community. There are many effective programs that will help you to quit this behavior. This information is not intended to replace advice given to you by your health care provider. Make sure you discuss any questions you have with your health care provider. Document Revised: 11/12/2018 Document Reviewed:  03/04/2016 Elsevier Patient Education  2020 [employer].

## 2019-05-25 NOTE — Telephone Encounter (Signed)
Called patient to see if he was interested in participating in the Cardiac Rehab Program. Patient stated yes. Patient will come in for orientation on 06/14/2019@2 :00pm and will attend the 3:00pm exercise class.  Mailed homework package.

## 2019-06-07 ENCOUNTER — Other Ambulatory Visit: Payer: Self-pay | Admitting: Family Medicine

## 2019-06-07 MED FILL — CARVEDILOL 3.125 MG TABLET: 3.125 | 30 days supply | Qty: 60 | Fill #0

## 2019-06-07 MED FILL — BRILINTA 90 MG TABLET: 90 | 30 days supply | Qty: 60 | Fill #0

## 2019-06-10 ENCOUNTER — Telehealth (HOSPITAL_COMMUNITY): Payer: Self-pay

## 2019-06-10 NOTE — Telephone Encounter (Signed)
Cardiac Rehab Medication Review by a Pharmacist  Does the patient  feel that his/her medications are working for him/her?  yes  Has the patient been experiencing any side effects to the medications prescribed?  no  Does the patient measure his/her own blood pressure or blood glucose at home?  no Pt does not have a blood pressure monitor at home.  Does the patient have any problems obtaining medications due to transportation or finances?   no  Understanding of regimen: good Understanding of indications: good Potential of compliance: fair    Pharmacist comments: None    Hunter Mcmillan, PharmD PGY1 Pharmacy Resident  Please check AMION for all [facility] Pharmacy phone numbers After 10:00 PM, call Main Pharmacy [phone]  06/10/2019 9:09 AM

## 2019-06-13 ENCOUNTER — Telehealth (HOSPITAL_COMMUNITY): Payer: Self-pay | Admitting: *Deleted

## 2019-06-14 ENCOUNTER — Telehealth (HOSPITAL_COMMUNITY): Payer: Self-pay

## 2019-06-14 ENCOUNTER — Ambulatory Visit (HOSPITAL_COMMUNITY): Payer: Medicare Other

## 2019-06-14 NOTE — Telephone Encounter (Signed)
Cardiac Rehab Note:  Second unsuccessful call today to Mr. Hunter Mcmillan to follow up on failure to show for his Cardiac Rehab orientation appointment scheduled for today 06/14/19 at 2:00. Hipaa compliant VM message left requesting call back to [phone].  Virtie Bungert E. Suzie Portela RN, BSN [place]. [facility]  Cardiac and Pulmonary Rehabilitation Phone: [phone] Fax: [phone]

## 2019-06-15 ENCOUNTER — Encounter (HOSPITAL_COMMUNITY): Payer: Self-pay

## 2019-06-15 ENCOUNTER — Telehealth: Payer: Self-pay | Admitting: Family Medicine

## 2019-06-15 ENCOUNTER — Telehealth (HOSPITAL_COMMUNITY): Payer: Self-pay

## 2019-06-15 NOTE — Progress Notes (Signed)
°  Chronic Care Management   Outreach Note  06/15/2019 Name: Hunter Mcmillan MRN: [id number] DOB: [birth year]  Referred by: Pearline Cables, MD Reason for referral : No chief complaint on file.   An unsuccessful telephone outreach was attempted today. The patient was referred to the pharmacist for assistance with care management and care coordination.   Follow Up Plan:    Raynicia Dukes [employer] Scheduler

## 2019-06-15 NOTE — Telephone Encounter (Signed)
Pt didn't show up for his cardiac rehab orientation on 06/14/2019, canceled his remaining cardiac rehab sessions because pt has to complete orientation before he begin his cardiac rehab exercise sessions, called and left a message for pt to call back and mailed letter.

## 2019-06-20 ENCOUNTER — Ambulatory Visit (HOSPITAL_COMMUNITY): Payer: Medicare Other

## 2019-06-22 ENCOUNTER — Ambulatory Visit (HOSPITAL_COMMUNITY): Payer: Medicare Other

## 2019-06-24 ENCOUNTER — Ambulatory Visit (HOSPITAL_COMMUNITY): Payer: Medicare Other

## 2019-06-27 ENCOUNTER — Ambulatory Visit (HOSPITAL_COMMUNITY): Payer: Medicare Other

## 2019-06-29 ENCOUNTER — Ambulatory Visit (HOSPITAL_COMMUNITY): Payer: Medicare Other

## 2019-07-01 ENCOUNTER — Ambulatory Visit (HOSPITAL_COMMUNITY): Payer: Medicare Other

## 2019-07-01 NOTE — Telephone Encounter (Signed)
No response from pt regarding CR.  Closed referral.  

## 2019-07-04 ENCOUNTER — Ambulatory Visit (HOSPITAL_COMMUNITY): Payer: Medicare Other

## 2019-07-06 ENCOUNTER — Ambulatory Visit (HOSPITAL_COMMUNITY): Payer: Medicare Other

## 2019-07-08 ENCOUNTER — Ambulatory Visit (HOSPITAL_COMMUNITY): Payer: Medicare Other

## 2019-07-11 ENCOUNTER — Ambulatory Visit (HOSPITAL_COMMUNITY): Payer: Medicare Other

## 2019-07-11 ENCOUNTER — Other Ambulatory Visit: Payer: Self-pay | Admitting: Family Medicine

## 2019-07-13 ENCOUNTER — Ambulatory Visit (HOSPITAL_COMMUNITY): Payer: Medicare Other

## 2019-07-15 ENCOUNTER — Ambulatory Visit (HOSPITAL_COMMUNITY): Payer: Medicare Other

## 2019-07-18 ENCOUNTER — Ambulatory Visit (HOSPITAL_COMMUNITY): Payer: Medicare Other

## 2019-07-20 ENCOUNTER — Ambulatory Visit (HOSPITAL_COMMUNITY): Payer: Medicare Other

## 2019-07-22 ENCOUNTER — Ambulatory Visit (HOSPITAL_COMMUNITY): Payer: Medicare Other

## 2019-07-25 ENCOUNTER — Ambulatory Visit (HOSPITAL_COMMUNITY): Payer: Medicare Other

## 2019-07-27 ENCOUNTER — Ambulatory Visit (HOSPITAL_COMMUNITY): Payer: Medicare Other

## 2019-07-29 ENCOUNTER — Ambulatory Visit (HOSPITAL_COMMUNITY): Payer: Medicare Other

## 2019-08-03 ENCOUNTER — Ambulatory Visit (HOSPITAL_COMMUNITY): Payer: Medicare Other

## 2019-08-05 ENCOUNTER — Ambulatory Visit (HOSPITAL_COMMUNITY): Payer: Medicare Other

## 2019-08-08 ENCOUNTER — Ambulatory Visit (HOSPITAL_COMMUNITY): Payer: Medicare Other

## 2019-08-10 ENCOUNTER — Ambulatory Visit (HOSPITAL_COMMUNITY): Payer: Medicare Other

## 2019-08-12 ENCOUNTER — Ambulatory Visit (HOSPITAL_COMMUNITY): Payer: Medicare Other

## 2019-08-18 NOTE — Progress Notes (Deleted)
[facility] [address] [place], [place] [address] [phone]  Date:  08/22/2019   Name:  Hunter Mcmillan   DOB:  [birth year]   MRN:  [id number]  PCP:  [name], MD    Chief Complaint: No chief complaint on file.   History of Present Illness:  Hunter Mcmillan is a 69 y.o. very pleasant male patient who presents with the following:  Patient here today for short-term follow-up visit.  History of CAD, hepatitis C status post Harvoni Last seen by myself in March for hospital follow-up: He was recently admitted to the hospital actually 3 times February 21 to February 23 with STEMI, stenting x2 February 27 to March 2 with abdominal pain and elevated LFTs March 8 to March 12 with choledocholithiasis  At our visit in March he was doing quite a bit better He saw cardiology also in March.  At that time they discontinued colchicine which was being used for possible pericarditis 1. CAD: Recently underwent DES x2 to RCA.  Continue aspirin and Brilinta.  Emphasized on the importance of dual antiplatelet therapy.  On initial arrival, his heart rate was 42 by automatic blood pressure cuff, however EKG demonstrated his heart rate is actually in the 70s.  He does not have any significant symptom associated with bradycardia and I have not seen significant ventricular ectopy either.  We will continue to monitor for now. 2. Ischemic cardiomyopathy: EF 40 to 45%.  Continue carvedilol.  Euvolemic on physical exam 3. Pericarditis: He was placed on colchicine for presumed pericarditis.  Since chest pain has completely resolved, I will discontinue colchicine.  Prior to today's visit, he has already ran out of colchicine as he was only given 10 tablets. 4. Hypertension: Blood pressure stable on current therapy. 5. Hyperlipidemia: On Lipitor 6. Tobacco abuse: He has tried to cut back and to currently smoke about 3 to 4 cigarettes/day.  I  encouraged him to continue to try to quit tobacco altogether 7. history of CVA: No recent recurrence   COVID-19 series Colonoscopy up-to-date [facility] Patient Active Problem List   Diagnosis Date Noted  . Acute cholecystitis 05/09/2019  . Cholelithiases 04/30/2019  . Cholelithiasis 04/30/2019  . RUQ abdominal pain   . Presence of drug-eluting stent in right coronary artery 04/25/2019  . Hyperlipidemia with target LDL less than 70 04/25/2019  . Acute inferior STEMI 04/25/2019  . STEMI involving right coronary artery ([place]) 04/24/2019  . Abdominal aortic aneurysm (AAA) 3.0 cm to 5.5 cm in diameter in male ([facility]millan) 01/06/2017  . Essential hypertension 04/09/2015  . Hyperpigmentation of skin 10/05/2014  . COPD (chronic obstructive pulmonary disease) ([place]) 06/26/2014  . Sacroiliac joint dysfunction of right side 06/26/2014  . Alcoholism ([facility])   . Cocaine abuse (Hunter Mcmillan) 11/18/2013  . Scrotal mass 07/21/2013  . Cirrhosis ([facility]unter Mcmillan) 05/28/2013  . Routine general medical examination at a health care facility 07/19/2012  . Abnormal TSH 04/09/2012  . Coronary artery disease involving native heart with unstable angina pectoris ([place]) 01/14/2012  . Hx of non-ST elevation myocardial infarction (NSTEMI) 01/05/2012  . Tobacco abuse 01/05/2012  . Chronic hepatitis C without hepatic coma ([facility]unter Mcmillan) 01/05/2012  . Depression, recurrent ([facility]unter Mcmill[facility]) 01/05/2012  . Thrombocytopenia ([facility]unter Mc[facility]) 01/05/2012    Past Medical History:  Diagnosis Date  . Abdominal pain 05/02/2019  . Alcoholism ([facility])   . CAD (coronary artery disease)    a. 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 Chicken pox   . Depression   . ETOH abuse   . Genital warts   .  Hepatic steatosis   . Hepatitis C    a. s/p [name] ~ 2003 in [place], [place] => stopped after 6 mos due to neutropenia - viral load noted to be low at that time;  no follow up since;   b. abdominal u/s 11/13:  diffuse hepatic steatosis and/or hepatocellular disease    . Hx of echocardiogram    a. Echo 01/06/12: Mild focal basal hypertrophy the septum, vigorous LV, EF 65-70%, Gr 2 diast dysfn, trivial AI, MAC, trivial MR, trivial TR, PASP 36  . Hyperlipidemia   . Hypertension   . Non-STEMI (non-ST elevated myocardial infarction) ([facility]unter Mcmillan)   . Stroke [facility])    "minor" pt reported  . Thrombocytopenia (Hunter Mcmillan)   . Tobacco abuse     Past Surgical History:  Procedure Laterality Date  . CORONARY ANGIOGRAPHY N/A 04/24/2019   Procedure: CORONARY ANGIOGRAPHY;  Surgeon: Martinique, Peter M, MD;  [facility];  Service: Cardiovascular;  Laterality: N/A;  . CORONARY/GRAFT ACUTE MI REVASCULARIZATION N/A 04/24/2019   Procedure: Coronary/Graft Acute MI Revascularization;  Surgeon: Martinique, Peter M, MD;  Location: [place] CV LAB;  Service: Cardiovascular;  Laterality: N/A;  . ERCP N/A 05/13/2019   Procedure: ENDOSCOPIC RETROGRADE CHOLANGIOPANCREATOGRAPHY (ERCP);  Surgeon: Ronnette Juniper, MD;  Location: [facility];  Service: Gastroenterology;  Laterality: N/A;  . EYE SURGERY  1954   corrective surgery for crossed eyes  . EYE SURGERY  1959  . LEFT HEART CATH AND CORONARY ANGIOGRAPHY N/A 04/24/2019   Procedure: LEFT HEART CATH AND CORONARY ANGIOGRAPHY;  Surgeon: Martinique, Peter M, MD;  Location: [facility];  Service: Cardiovascular;  Laterality: N/A;  . LEFT HEART CATHETERIZATION WITH CORONARY ANGIOGRAM N/A 01/05/2012   Procedure: LEFT HEART CATHETERIZATION WITH CORONARY ANGIOGRAM;  Surgeon: Larey Dresser, MD;  [facility] C[facility] LAB;  Service: Cardiovascular;  Laterality: N/A;  . REMOVAL OF STONES  05/13/2019   Procedure: REMOVAL OF STONES;  Surgeon: Ronnette Juniper, MD;  Location: [facility];  Service: Gastroenterology;;  . [name]  05/13/2019   Procedure: [name];  Surgeon: Ronnette Juniper, MD;  Location: [facility] ENDOSCOPY;  Service: Gastroenterology;;    Social History   Tobacco Use  . Smoking status: Light Tobacco Smoker    Packs/day: 0.10    Types: Cigarettes  . Smokeless tobacco: Never Used  . Tobacco comment: almost quit, very rare, 1-2 daily  Vaping Use  . Vaping Use: Never used  Substance Use Topics  . Alcohol use: Not Currently    Alcohol/week: 0.0 standard drinks    Comment: Last drink: 10 days ago   . Drug use: Not Currently    Types: Cocaine    Comment: history of cocaine last use 2014    Family History  Problem Relation Age of Onset  . Heart attack Mother 76  . Diabetes Mother   . Heart disease Mother   . Hyperlipidemia Father   . Hypertension Father   . Diabetes Maternal Grandfather   . Heart disease Maternal Grandfather   . Early death Maternal Grandfather   .  Heart attack Maternal Grandfather   . Hearing loss Sister   . Hyperlipidemia Sister   . Depression Brother   . Diabetes Brother   . Hyperlipidemia Brother   . Early death Maternal Grandmother   . Heart disease Maternal Grandmother   . Heart attack Maternal Grandmother   . Hearing loss Sister     No Known Allergies  Medication list has been reviewed and updated.  Current Outpatient Medications on File Prior to Visit  Medication Sig Dispense Refill  . acetaminophen (TYLENOL) 500 MG tablet Take 1 tablet (500 mg total) by mouth every 6 (six) hours as needed for mild pain, fever or headache. 20 tablet 0  . albuterol (VENTOLIN HFA) 108 (90 Base) MCG/ACT inhaler Inhale 2 puffs into the lungs every 6 (six) hours as needed for wheezing or shortness of breath. 18 g 6  . aspirin EC 81 MG tablet Take 81 mg by mouth daily.    Marland Kitchen atorvastatin (LIPITOR) 80 MG tablet Take 1 tablet (80 mg total) by mouth daily at 6 PM. 30 tablet 6  . carvedilol (COREG) 3.125 MG tablet TAKE 1 TABLET  BY MOUTH 2 TIMES DAILY WITH A MEAL. 60 tablet 5  . cyclobenzaprine (FLEXERIL) 10 MG tablet TAKE 1 TABLET BY MOUTH TWICE DAILY AS NEEDED FOR MUSCLE SPASM (Patient taking differently: Take 10 mg by mouth daily as needed for muscle spasms. ) 30 tablet 3  . diphenhydrAMINE (BENADRYL) 25 mg capsule Take 25 mg by mouth as needed for allergies.    . fluticasone (FLOVENT HFA) 110 MCG/ACT inhaler Inhale 2 puffs into the lungs 2 (two) times daily. 1 Inhaler 3  . Multiple Vitamin (MULTIVITAMIN WITH MINERALS) TABS tablet Take 1 tablet by mouth daily.    . nitroGLYCERIN (NITROSTAT) 0.4 MG SL tablet Place 1 tablet (0.4 mg total) under the tongue every 5 (five) minutes as needed for chest pain. 25 tablet 1  . ticagrelor (BRILINTA) 90 MG TABS tablet Take 1 tablet (90 mg total) by mouth 2 (two) times daily. 60 tablet 11   No current facility-administered medications on file prior to visit.    Review of Systems:  As per HPI- otherwise negative.   Physical Examination: There were no vitals filed for this visit. There were no vitals filed for this visit. There is no height or weight on file to calculate BMI. Ideal Body Weight:    GEN: no acute distress. HEENT: Atraumatic, Normocephalic.  Ears and Nose: No external deformity. CV: RRR, No M/G/R. No JVD. No thrill. No extra heart sounds. PULM: CTA B, no wheezes, crackles, rhonchi. No retractions. No resp. distress. No accessory muscle use. ABD: S, NT, ND, +BS. No rebound. No HSM. EXTR: No c/c/e PSYCH: Normally interactive. Conversant.    Assessment and Plan: *** This visit occurred during the SARS-CoV-2 public health emergency.  Safety protocols were in place, including screening questions prior to the visit, additional usage of staff PPE, and extensive cleaning of exam room while observing appropriate contact time as indicated for disinfecting solutions.    Signed Lamar Blinks, MD

## 2019-08-22 ENCOUNTER — Ambulatory Visit (INDEPENDENT_AMBULATORY_CARE_PROVIDER_SITE_OTHER): Payer: Medicare Other | Admitting: Family Medicine

## 2019-08-22 DIAGNOSIS — Z5329 Procedure and treatment not carried out because of patient's decision for other reasons: Secondary | ICD-10-CM

## 2019-09-01 ENCOUNTER — Telehealth: Payer: Self-pay | Admitting: Cardiology

## 2019-09-01 NOTE — Telephone Encounter (Signed)
I attempted to contact patient 09/01/19 to schedule follow up visit from patients recall list. The patient didn't answer, left message for patient to return call to get appt scheduled.

## 2019-10-20 NOTE — Progress Notes (Signed)
Cardiology Office Note:    Date:  10/24/2019   ID:  Hunter Mcmillan, DOB 10-14-1950, MRN [id number]  PCP:  [name], MD  Cardiologist:  Maryalice Pasley Martinique, MD  Electrophysiologist:  None   Referring MD: [name], MD   Chief Complaint  Patient presents with  . Coronary Artery Disease    History of Present Illness:    Hunter Mcmillan is a 69 y.o. male with a hx of CAD s/p DES x2 to RCA, HTN, HLD, h/o CVA, EtOH abuse, tobacco abuse and COPD.  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 More recently, patient was admitted for STEMI in February 2021. Cardiac catheterization revealed 100% occluded proximal RCA treated with 2 drug-eluting stents, and heavy thrombus in the left circumflex.  Left circumflex disease was managed medically.  Postprocedure, he was placed on aspirin and Brilinta.  Echocardiogram showed EF of 40 to 45% with grade 1 DD.  Postprocedure, he had recurrent chest pain, however relook cardiac catheterization showed patent stents.  He was started on colchicine for possible pericarditis.  Since then, patient was readmitted to the hospital on 04/30/2019 with abdominal pain.  He had elevated LFT on admission.  He also had a heme positive stool.  GI service recommended Protonix therapy.  HIDA scan was nondiagnostic.  MRCP was unremarkable.  Symptoms consistent with cholelithiasis.  In anticipation of ERCP, Brilinta was temporarily held and that the patient was placed on Aggrastat for bridge.  He underwent a ERCP on 05/13/2019, sphincterotomy was performed, cholangiogram did not reveal any stones.  Common bile duct was swept several times, good bile flow was noted.  Liver enzymes trended down after the procedure.  During this hospitalization, patient was started on carvedilol, previous metoprolol succinate was discontinued.  On follow  up today he is doing well. No chest pain or dyspnea. He is smoking 3-4 cigs/day. He denies any recurrent gallbladder problems. Notes he may move to [facility] to be near his daughter near [place].    Past Medical History:  Diagnosis Date  . Abdominal pain 05/02/2019  . Alcoholism ([place])   . CAD (coronary artery disease)    a. 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 Chicken pox   . Depression   . ETOH abuse   . Genital warts   . Hepatic steatosis   . Hepatitis C    a. s/p [name] ~ 2003 in [place], [place] => stopped after 6 mos due to neutropenia - viral load noted to be low at that time;  no follow up since;   b. abdominal u/s 11/13:  diffuse hepatic steatosis and/or hepatocellular disease    . Hx of echocardiogram    a. Echo 01/06/12: Mild focal basal hypertrophy the septum, vigorous LV, EF 65-70%, Gr 2 diast dysfn, trivial AI, MAC, trivial MR, trivial TR, PASP 36  . Hyperlipidemia   . Hypertension   . Non-STEMI (non-ST elevated myocardial infarction) ([place])   . Stroke [facility])    "minor" pt reported  . Thrombocytopenia ([place])   .  Tobacco abuse     Past Surgical History:  Procedure Laterality Date  . CORONARY ANGIOGRAPHY N/A 04/24/2019   Procedure: CORONARY ANGIOGRAPHY;  Surgeon: Martinique, Qasim Diveley M, MD;  Location: [facility];  Service: Cardiovascular;  Laterality: N/A;  . CORONARY/GRAFT ACUTE MI REVASCULARIZATION N/A 04/24/2019   Procedure: Coronary/Graft Acute MI Revascularization;  Surgeon: Martinique, Sirinity Outland M, MD;  Location: [facility];  Service: Cardiovascular;  Laterality: N/A;  . ERCP N/A 05/13/2019   Procedure: ENDOSCOPIC RETROGRADE CHOLANGIOPANCREATOGRAPHY (ERCP);  Surgeon:  Ronnette Juniper, MD;  Location: [facility];  Service: Gastroenterology;  Laterality: N/A;  . EYE SURGERY  1954   corrective surgery for crossed eyes  . EYE SURGERY  1959  . LEFT HEART CATH AND CORONARY ANGIOGRAPHY N/A 04/24/2019   Procedure: LEFT HEART CATH AND CORONARY ANGIOGRAPHY;  Surgeon: Martinique, Delio Slates M, MD;  Location: [place];  Service: Cardiovascular;  Laterality: N/A;  . LEFT HEART CATHETERIZATION WITH CORONARY ANGIOGRAM N/A 01/05/2012   Procedure: LEFT HEART CATHETERIZATION WITH CORONARY ANGIOGRAM;  Surgeon: Larey Dresser, MD;  Location: [facility] CATH LAB;  Service: Cardiovascular;  Laterality: N/A;  . REMOVAL OF STONES  05/13/2019   Procedure: REMOVAL OF STONES;  Surgeon: Ronnette Juniper, MD;  Location: [facility];  Service: Gastroenterology;;  . [name]  05/13/2019   Procedure: SPHINCTEROTOMY;  Surgeon: Ronnette Juniper, MD;  Location: [facility] ENDOSCOPY;  Service: Gastroenterology;;    Current Medications: Current Meds  Medication Sig  . acetaminophen (TYLENOL) 500 MG tablet Take 1 tablet (500 mg total) by mouth every 6 (six) hours as needed for mild pain, fever or headache.  . albuterol (VENTOLIN HFA) 108 (90 Base) MCG/ACT inhaler Inhale 2 puffs into the lungs every 6 (six) hours as needed for wheezing or shortness of breath.  Marland Kitchen aspirin EC 81 MG tablet Take 81 mg by mouth daily.  Marland Kitchen atorvastatin (LIPITOR) 80 MG tablet Take 1 tablet (80 mg total) by mouth daily at 6 PM.  . cyclobenzaprine (FLEXERIL) 10 MG tablet TAKE 1 TABLET BY MOUTH TWICE DAILY AS NEEDED FOR MUSCLE SPASM  . diphenhydrAMINE (BENADRYL) 25 mg capsule Take 25 mg by mouth as needed for allergies.  . fluticasone (FLOVENT HFA) 110 MCG/ACT inhaler Inhale 2 puffs into the lungs 2 (two) times daily.  . Multiple Vitamin (MULTIVITAMIN WITH MINERALS) TABS tablet Take 1 tablet by mouth daily.  . nitroGLYCERIN (NITROSTAT) 0.4 MG SL tablet Place 1 tablet (0.4 mg total) under the tongue every 5 (five) minutes as needed for chest  pain.  . ticagrelor (BRILINTA) 90 MG TABS tablet Take 1 tablet (90 mg total) by mouth 2 (two) times daily.  . [DISCONTINUED] carvedilol (COREG) 3.125 MG tablet TAKE 1 TABLET BY MOUTH 2 TIMES DAILY WITH A MEAL.     Allergies:   Patient has no known allergies.   Social History   Socioeconomic History  . Marital status: Single    Spouse name: Not on file  . Number of children: 1  . Years of education: Not on file  . Highest education level: Bachelor's degree (e.g., BA, AB, BS)  Occupational History  . Occupation: Music therapist: [employer]    Comment: retired  Tobacco Use  . Smoking status: Light Tobacco Smoker    Packs/day: 0.10    Types: Cigarettes  . Smokeless tobacco: Never Used  . Tobacco comment: almost quit, very rare, 1-2 daily  Vaping Use  . Vaping Use: Never used  Substance and Sexual Activity  .  Alcohol use: Not Currently    Alcohol/week: 0.0 standard drinks    Comment: Last drink: 10 days ago   . Drug use: Not Currently    Types: Cocaine    Comment: history of cocaine last use 2014  . Sexual activity: Yes    Partners: Female    Comment: pt. declined condoms  Other Topics Concern  . Not on file  Social History Narrative  . Not on file   Social Determinants of Health   Financial Resource Strain:   . Difficulty of Paying Living Expenses: Not on file  Food Insecurity:   . Worried About Charity fundraiser in the Last Year: Not on file  . Ran Out of Food in the Last Year: Not on file  Transportation Needs:   . Lack of Transportation (Medical): Not on file  . Lack of Transportation (Non-Medical): Not on file  Physical Activity:   . Days of Exercise per Week: Not on file  . Minutes of Exercise per Session: Not on file  Stress:   . Feeling of Stress : Not on file  Social Connections:   . Frequency of Communication with Friends and Family: Not on file  . Frequency of Social Gatherings with Friends and Family: Not on file  . Attends Religious  Services: Not on file  . Active Member of Clubs or Organizations: Not on file  . Attends Archivist Meetings: Not on file  . Marital Status: Not on file     Family History: The patient's family history includes Depression in his brother; Diabetes in his brother, maternal grandfather, and mother; Early death in his maternal grandfather and maternal grandmother; Hearing loss in his sister and sister; Heart attack in his maternal grandfather and maternal grandmother; Heart attack (age of onset: 4) in his mother; Heart disease in his maternal grandfather, maternal grandmother, and mother; Hyperlipidemia in his brother, father, and sister; Hypertension in his father.  ROS:   Please see the history of present illness.     All other systems reviewed and are negative.  EKGs/Labs/Other Studies Reviewed:    The following studies were reviewed today:  Echo 04/24/2019 1. Left ventricular ejection fraction, by estimation, is 40 to 45%. The  left ventricle has mildly decreased function. The left ventricle  demonstrates regional wall motion abnormalities (see scoring  diagram/findings for description). There is mild left  ventricular hypertrophy. Left ventricular diastolic parameters are  consistent with Grade I diastolic dysfunction (impaired relaxation).  2. Right ventricular systolic function is severely reduced. The right  ventricular size is mildly enlarged. There is mildly elevated pulmonary  artery systolic pressure.  3. The mitral valve is grossly normal. Trivial mitral valve  regurgitation.  4. Tricuspid valve regurgitation is mild to moderate.  5. The aortic valve has an indeterminant number of cusps. Aortic valve  regurgitation is not visualized.  6. The inferior vena cava is dilated in size with <50% respiratory  variability, suggesting right atrial pressure of 15 mmHg.    Cath 04/24/2019  Prox Cx lesion is 90% stenosed.  Previously placed Prox RCA to Mid RCA stent  (unknown type) is widely patent.   1. Continued excellent patency of the RCA stents. The LCx stenosis is chronic and this is a small vessel.   Plan: continue medical management. Will remove femoral sheath manually. Will add colchicine for possible pericardial component to pain. Prn analgesics.     EKG:  EKG is not ordered today.    Recent Labs:  04/25/2019: Magnesium 1.8 05/13/2019: ALT 46; BUN 10; Creatinine, Ser 0.93; Hemoglobin 11.9; Platelets 233; Potassium 4.1; Sodium 136  Recent Lipid Panel    Component Value Date/Time   CHOL 94 04/26/2019 0314   TRIG 63 04/26/2019 0314   HDL 16 (L) 04/26/2019 0314   CHOLHDL 5.9 04/26/2019 0314   VLDL 13 04/26/2019 0314   LDLCALC 65 04/26/2019 0314    Physical Exam:    VS:  BP (!) 144/84   Pulse 85   Ht _0  (1.803 m)   Wt 152 lb 3.2 oz (69 kg)   SpO2 95%   BMI 21.23 kg/m     Wt Readings from Last 3 Encounters:  10/24/19 152 lb 3.2 oz (69 kg)  05/23/19 146 lb 12.8 oz (66.6 kg)  05/19/19 144 lb (65.3 kg)     GEN:  Well nourished, well developed in no acute distress HEENT: Normal NECK: No JVD; No carotid bruits LYMPHATICS: No lymphadenopathy CARDIAC: RRR, no murmurs, rubs, gallops RESPIRATORY:  Clear to auscultation without rales, wheezing or rhonchi  ABDOMEN: Soft, non-tender, non-distended MUSCULOSKELETAL:  No edema; No deformity  SKIN: Warm and dry NEUROLOGIC:  Alert and oriented x 3 PSYCHIATRIC:  Normal affect   ASSESSMENT:    1. Coronary artery disease involving native coronary artery of native heart without angina pectoris   2. Essential hypertension   3. Hyperlipidemia with target LDL less than 70   4. Tobacco abuse   5. Ischemic cardiomyopathy    PLAN:    In order of problems listed above:  1. CAD: s/p DES x2 to RCA in February 2021.  Continue aspirin and Brilinta.  On beta blocker and high dose statin. Fortunately he is having no further gallbladder problems. If he did have recurrent biliary colic we could  interrupt Brilinta at this point for cholecystectomy. If he remains asymptomatic I would favor continuing DAPT for one year without interruption.   2. Ischemic cardiomyopathy: EF 40 to 45%.  Continue carvedilol.  Euvolemic on physical exam  3. Hypertension: Blood pressure is elevated today. Recommend increasing Coreg to 6.25 mg bid.   4. Hyperlipidemia: On Lipitor. Follow up fasting lab today.  5. Tobacco abuse: currently smoking about 3 to 4 cigarettes/day.  I encouraged him to continue to try to quit tobacco altogether  6. history of CVA: No recent recurrence  8.   Cholecystitis. S/p ERCP and sphincterotomy. Currently asymptomatic. See #1.    Medication Adjustments/Labs and Tests Ordered: Current medicines are reviewed at length with the patient today.  Concerns regarding medicines are outlined above.  Orders Placed This Encounter  Procedures  . CBC w/Diff/Platelet  . Comprehensive Metabolic Panel (CMET)  . Lipid panel   Meds ordered this encounter  Medications  . carvedilol (COREG) 6.25 MG tablet    Sig: Take 1 tablet (6.25 mg total) by mouth 2 (two) times daily.    Dispense:  180 tablet    Refill:  3    Patient Instructions  Increase dose of Carvedilol to 6.25 mg twice a day  We will check lab work today  Stop smoking completely  Follow up in 6 months.    Signed, Saba Gomm Martinique, MD  10/24/2019 2:42 PM    [facility]

## 2019-10-24 ENCOUNTER — Encounter: Payer: Self-pay | Admitting: Cardiology

## 2019-10-24 ENCOUNTER — Other Ambulatory Visit: Payer: Self-pay

## 2019-10-24 ENCOUNTER — Ambulatory Visit (INDEPENDENT_AMBULATORY_CARE_PROVIDER_SITE_OTHER): Payer: Medicare PPO | Admitting: Cardiology

## 2019-10-24 VITALS — BP 144/84 | HR 85 | Ht 71.0 in | Wt 152.2 lb

## 2019-10-24 DIAGNOSIS — I10 Essential (primary) hypertension: Secondary | ICD-10-CM | POA: Diagnosis not present

## 2019-10-24 DIAGNOSIS — I255 Ischemic cardiomyopathy: Secondary | ICD-10-CM | POA: Diagnosis not present

## 2019-10-24 DIAGNOSIS — E785 Hyperlipidemia, unspecified: Secondary | ICD-10-CM | POA: Diagnosis not present

## 2019-10-24 DIAGNOSIS — Z720 Tobacco use: Secondary | ICD-10-CM

## 2019-10-24 DIAGNOSIS — I2510 Atherosclerotic heart disease of native coronary artery without angina pectoris: Secondary | ICD-10-CM | POA: Diagnosis not present

## 2019-10-24 MED ORDER — CARVEDILOL 6.25 MG PO TABS: 6.2500 mg | ORAL_TABLET | Freq: Two times a day (BID) | ORAL | 3 refills | Status: DC

## 2019-10-24 NOTE — Patient Instructions (Signed)
Increase dose of Carvedilol to 6.25 mg twice a day  We will check lab work today  Stop smoking completely  Follow up in 6 months.

## 2019-10-25 LAB — CBC WITH DIFFERENTIAL/PLATELET
Basophils Absolute: 0.1 10*3/uL (ref 0.0–0.2)
Basos: 1 %
EOS (ABSOLUTE): 0.2 10*3/uL (ref 0.0–0.4)
Eos: 2 %
Hematocrit: 49.1 % (ref 37.5–51.0)
Hemoglobin: 16.4 g/dL (ref 13.0–17.7)
Immature Grans (Abs): 0 10*3/uL (ref 0.0–0.1)
Immature Granulocytes: 0 %
Lymphocytes Absolute: 2.1 10*3/uL (ref 0.7–3.1)
Lymphs: 31 %
MCH: 33.2 pg — ABNORMAL HIGH (ref 26.6–33.0)
MCHC: 33.4 g/dL (ref 31.5–35.7)
MCV: 99 fL — ABNORMAL HIGH (ref 79–97)
Monocytes Absolute: 0.6 10*3/uL (ref 0.1–0.9)
Monocytes: 8 %
Neutrophils Absolute: 3.9 10*3/uL (ref 1.4–7.0)
Neutrophils: 58 %
Platelets: 177 10*3/uL (ref 150–450)
RBC: 4.94 x10E6/uL (ref 4.14–5.80)
RDW: 14.5 % (ref 11.6–15.4)
WBC: 6.8 10*3/uL (ref 3.4–10.8)

## 2019-10-25 LAB — COMPREHENSIVE METABOLIC PANEL
ALT: 10 IU/L (ref 0–44)
AST: 21 IU/L (ref 0–40)
Albumin/Globulin Ratio: 1.3 (ref 1.2–2.2)
Albumin: 4.7 g/dL (ref 3.8–4.8)
Alkaline Phosphatase: 100 IU/L (ref 48–121)
BUN/Creatinine Ratio: 12 (ref 10–24)
BUN: 12 mg/dL (ref 8–27)
Bilirubin Total: 0.4 mg/dL (ref 0.0–1.2)
CO2: 24 mmol/L (ref 20–29)
Calcium: 9.9 mg/dL (ref 8.6–10.2)
Chloride: 100 mmol/L (ref 96–106)
Creatinine, Ser: 0.97 mg/dL (ref 0.76–1.27)
GFR calc Af Amer: 91 mL/min/{1.73_m2} (ref >59)
GFR calc non Af Amer: 79 mL/min/{1.73_m2} (ref >59)
Globulin, Total: 3.5 g/dL (ref 1.5–4.5)
Glucose: 75 mg/dL (ref 65–99)
Potassium: 5.1 mmol/L (ref 3.5–5.2)
Sodium: 140 mmol/L (ref 134–144)
Total Protein: 8.2 g/dL (ref 6.0–8.5)

## 2019-10-25 LAB — LIPID PANEL
Chol/HDL Ratio: 3.9 ratio (ref 0.0–5.0)
Cholesterol, Total: 186 mg/dL (ref 100–199)
HDL: 48 mg/dL (ref >39)
LDL Chol Calc (NIH): 108 mg/dL — ABNORMAL HIGH (ref 0–99)
Triglycerides: 169 mg/dL — ABNORMAL HIGH (ref 0–149)
VLDL Cholesterol Cal: 30 mg/dL (ref 5–40)

## 2019-10-31 ENCOUNTER — Other Ambulatory Visit: Payer: Self-pay

## 2019-10-31 DIAGNOSIS — E785 Hyperlipidemia, unspecified: Secondary | ICD-10-CM

## 2019-10-31 DIAGNOSIS — I2510 Atherosclerotic heart disease of native coronary artery without angina pectoris: Secondary | ICD-10-CM

## 2019-10-31 MED ORDER — EZETIMIBE 10 MG PO TABS: 10.0000 mg | ORAL_TABLET | Freq: Every day | ORAL | 3 refills | Status: DC

## 2019-12-31 ENCOUNTER — Ambulatory Visit: Payer: Medicare PPO

## 2020-03-28 ENCOUNTER — Emergency Department (HOSPITAL_COMMUNITY)
Admission: EM | Admit: 2020-03-28 | Discharge: 2020-03-28 | Disposition: A | Discharge status: Home / Self Care | Payer: Medicare PPO | Attending: Emergency Medicine | Admitting: Emergency Medicine

## 2020-03-28 ENCOUNTER — Other Ambulatory Visit: Payer: Self-pay

## 2020-03-28 ENCOUNTER — Encounter (HOSPITAL_COMMUNITY): Payer: Self-pay

## 2020-03-28 ENCOUNTER — Emergency Department (HOSPITAL_COMMUNITY): Payer: Medicare PPO

## 2020-03-28 DIAGNOSIS — J189 Pneumonia, unspecified organism: Secondary | ICD-10-CM

## 2020-03-28 DIAGNOSIS — J449 Chronic obstructive pulmonary disease, unspecified: Secondary | ICD-10-CM | POA: Insufficient documentation

## 2020-03-28 DIAGNOSIS — Z79899 Other long term (current) drug therapy: Secondary | ICD-10-CM | POA: Insufficient documentation

## 2020-03-28 DIAGNOSIS — J9811 Atelectasis: Secondary | ICD-10-CM | POA: Diagnosis not present

## 2020-03-28 DIAGNOSIS — Z7982 Long term (current) use of aspirin: Secondary | ICD-10-CM | POA: Diagnosis not present

## 2020-03-28 DIAGNOSIS — F17210 Nicotine dependence, cigarettes, uncomplicated: Secondary | ICD-10-CM | POA: Diagnosis not present

## 2020-03-28 DIAGNOSIS — J181 Lobar pneumonia, unspecified organism: Secondary | ICD-10-CM | POA: Diagnosis not present

## 2020-03-28 DIAGNOSIS — I25110 Atherosclerotic heart disease of native coronary artery with unstable angina pectoris: Secondary | ICD-10-CM | POA: Diagnosis not present

## 2020-03-28 DIAGNOSIS — Z20822 Contact with and (suspected) exposure to covid-19: Secondary | ICD-10-CM | POA: Diagnosis not present

## 2020-03-28 DIAGNOSIS — I10 Essential (primary) hypertension: Secondary | ICD-10-CM | POA: Diagnosis not present

## 2020-03-28 DIAGNOSIS — R059 Cough, unspecified: Secondary | ICD-10-CM | POA: Diagnosis not present

## 2020-03-28 DIAGNOSIS — Z20828 Contact with and (suspected) exposure to other viral communicable diseases: Secondary | ICD-10-CM | POA: Diagnosis not present

## 2020-03-28 LAB — CBC WITH DIFFERENTIAL/PLATELET
Abs Immature Granulocytes: 0.01 10*3/uL (ref 0.00–0.07)
Basophils Absolute: 0 10*3/uL (ref 0.0–0.1)
Basophils Relative: 1 %
Eosinophils Absolute: 0.1 10*3/uL (ref 0.0–0.5)
Eosinophils Relative: 1 %
HCT: 46.9 % (ref 39.0–52.0)
Hemoglobin: 15.7 g/dL (ref 13.0–17.0)
Immature Granulocytes: 0 %
Lymphocytes Relative: 23 %
Lymphs Abs: 1.4 10*3/uL (ref 0.7–4.0)
MCH: 33.3 pg (ref 26.0–34.0)
MCHC: 33.5 g/dL (ref 30.0–36.0)
MCV: 99.4 fL (ref 80.0–100.0)
Monocytes Absolute: 0.8 10*3/uL (ref 0.1–1.0)
Monocytes Relative: 12 %
Neutro Abs: 4.1 10*3/uL (ref 1.7–7.7)
Neutrophils Relative %: 63 %
Platelets: 169 10*3/uL (ref 150–400)
RBC: 4.72 MIL/uL (ref 4.22–5.81)
RDW: 13.7 % (ref 11.5–15.5)
WBC: 6.4 10*3/uL (ref 4.0–10.5)
nRBC: 0 % (ref 0.0–0.2)

## 2020-03-28 LAB — BASIC METABOLIC PANEL
Anion gap: 11 (ref 5–15)
BUN: 17 mg/dL (ref 8–23)
CO2: 23 mmol/L (ref 22–32)
Calcium: 8.9 mg/dL (ref 8.9–10.3)
Chloride: 97 mmol/L — ABNORMAL LOW (ref 98–111)
Creatinine, Ser: 1.32 mg/dL — ABNORMAL HIGH (ref 0.61–1.24)
GFR, Estimated: 58 mL/min — ABNORMAL LOW (ref >60)
Glucose, Bld: 99 mg/dL (ref 70–99)
Potassium: 5 mmol/L (ref 3.5–5.1)
Sodium: 131 mmol/L — ABNORMAL LOW (ref 135–145)

## 2020-03-28 LAB — SARS CORONAVIRUS 2 BY RT PCR (HOSPITAL ORDER, PERFORMED IN ~~LOC~~ HOSPITAL LAB): SARS Coronavirus 2: NEGATIVE

## 2020-03-28 MED ORDER — DOXYCYCLINE HYCLATE 100 MG PO TABS
100.0000 mg | ORAL_TABLET | Freq: Once | ORAL | Status: AC
  Administered 2020-03-28: 100 mg via ORAL
  Filled 2020-03-28: qty 1

## 2020-03-28 MED ORDER — DOXYCYCLINE HYCLATE 100 MG PO CAPS: 100.0000 mg | ORAL_CAPSULE | Freq: Two times a day (BID) | ORAL | 0 refills | Status: AC

## 2020-03-28 MED ORDER — AEROCHAMBER PLUS FLO-VU MEDIUM MISC: 1.0000 | Freq: Once | Status: DC

## 2020-03-28 MED ORDER — ALBUTEROL SULFATE HFA 108 (90 BASE) MCG/ACT IN AERS
2.0000 | INHALATION_SPRAY | Freq: Once | RESPIRATORY_TRACT | Status: AC
  Administered 2020-03-28: 2 via RESPIRATORY_TRACT
  Filled 2020-03-28: qty 6.7

## 2020-03-28 NOTE — ED Provider Notes (Signed)
I provided a substantive portion of the care of this patient.  I personally performed the entirety of the medical decision making for this encounter.  EKG Interpretation  Date/Time:  Wednesday March 28 2020 11:26:47 EST Ventricular Rate:  77 PR Interval:    QRS Duration: 102 QT Interval:  398 QTC Calculation: 451 R Axis:   38 Text Interpretation: Sinus rhythm Atrial premature complex Probable inferior infarct, old Confirmed by Lorre Nick ([id number]) on 03/28/2020 12:29:17 PM  70 year old male presents with shortness of breath and cough.  Evidence of pneumonia.  Will place on antibiotics and discharged home   Lorre Nick, MD 03/28/20 1323

## 2020-03-28 NOTE — Discharge Instructions (Signed)
You were evaluated today for your cough and shortness of breath.  Your vital signs and physical exam are very reassuring.  Your chest x-ray revealed infection in your right lung, consistent with pneumonia.  You have been given the first dose of antibiotic while in the emergency department, and have been discharged home with course of antibiotics at home.  You should take them as prescribed for the entire course.  May take Tylenol or ibuprofen as needed for fever.  Please follow-up closely with your primary care doctor.  Return to the emergency department if you develop any chest pain, worsening shortness of breath, nausea or vomiting does not stop, if you pass out, or if you develop any other new severe symptoms.

## 2020-03-28 NOTE — ED Provider Notes (Signed)
[facility] EMERGENCY DEPARTMENT Provider Note   CSN: [id number] Arrival date & time: 03/28/20  5188     History Chief Complaint  Patient presents with  . Cough    Hunter Mcmillan is a 70 y.o. male who presents with concern for 4 days of progressively worsening cough. Patient states that he had a coughing spell that lasted approximately 2 hours this morning, where he could not catch his breath and he was consistently coughing up white phlegm.  Denies hemoptysis, denies chest pain, palpitations, syncope.  Patient has been vaccinated against COVID-19.  Denies sick contacts.  Endorses mild headache after long coughing spells, but denies congestion, sore throat, dental pain, nausea, vomiting, diarrhea.  He endorses chills at home but denies having taken his temperature.  Patient is not taking any medication at home for his symptoms.  I personally reviewed this patient's medical records.  He has history of COPD, not on oxygen at home.  Patient with history of MI  with subsequent stent placement, LVEF 45 to 50%, on aspirin and brilinta. Patient with history of tobacco abuse, has weaned smoking to 2 to 3 cigarettes daily.  History of hypertension, hepatitis C, hyperlipidemia, alcoholism, hepatic steatosis.   HPI     Past Medical History:  Diagnosis Date  . Abdominal pain 05/02/2019  . Alcoholism ([place])   . CAD (coronary artery disease)    a. 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 Chicken pox   . Depression   . ETOH abuse   . Genital warts   . Hepatic steatosis   . Hepatitis C    a. s/p [name] ~ 2003 in [place], [place] => stopped after 6 mos due to neutropenia - viral  load noted to be low at that time;  no follow up since;   b. abdominal u/s 11/13:  diffuse hepatic steatosis and/or hepatocellular disease    . Hx of echocardiogram    a. Echo 01/06/12: Mild focal basal hypertrophy the septum, vigorous LV, EF 65-70%, Gr 2 diast dysfn, trivial AI, MAC, trivial MR, trivial TR, PASP 36  . Hyperlipidemia   . Hypertension   . Non-STEMI (non-ST elevated myocardial infarction) ([place])   . Stroke [facility])    "minor" pt reported  . Thrombocytopenia ([place])   . Tobacco abuse     Patient Active Problem List   Diagnosis Date Noted  . Acute cholecystitis 05/09/2019  . Cholelithiases 04/30/2019  . Cholelithiasis 04/30/2019  . RUQ abdominal pain   . Presence of drug-eluting stent in right coronary artery 04/25/2019  . Hyperlipidemia with target LDL less than 70 04/25/2019  . Acute inferior STEMI 04/25/2019  . STEMI involving right coronary artery ([place]) 04/24/2019  . Abdominal aortic aneurysm (AAA) 3.0 cm to 5.5 cm in diameter in male ([place]) 01/06/2017  . Essential hypertension 04/09/2015  . Hyperpigmentation of skin 10/05/2014  . COPD (chronic obstructive pulmonary disease) ([place]) 06/26/2014  . Sacroiliac joint dysfunction of right side 06/26/2014  . Alcoholism ([place])   . Cocaine abuse ([place]) 11/18/2013  . Scrotal mass 07/21/2013  . Cirrhosis ([place]) 05/28/2013  . Routine general medical examination at a health care facility 07/19/2012  .  Abnormal TSH 04/09/2012  . Coronary artery disease involving native heart with unstable angina pectoris ([place]) 01/14/2012  . Hx of non-ST elevation myocardial infarction (NSTEMI) 01/05/2012  . Tobacco abuse 01/05/2012  . Chronic hepatitis C without hepatic coma ([place]) 01/05/2012  . Depression, recurrent ([place]) 01/05/2012  . Thrombocytopenia ([place]) 01/05/2012    Past Surgical History:  Procedure Laterality Date  . CORONARY ANGIOGRAPHY N/A 04/24/2019   Procedure: CORONARY ANGIOGRAPHY;  Surgeon: Martinique, Peter M, MD;  Location: [place] [facility]  [facility];  Service: Cardiovascular;  Laterality: N/A;  . CORONARY/GRAFT ACUTE MI REVASCULARIZATION N/A 04/24/2019   Procedure: Coronary/Graft Acute MI Revascularization;  Surgeon: Martinique, Peter M, MD;  Location: [place] [facility];  Service: Cardiovascular;  Laterality: N/A;  . ERCP N/A 05/13/2019   Procedure: ENDOSCOPIC RETROGRADE CHOLANGIOPANCREATOGRAPHY (ERCP);  Surgeon: Ronnette Juniper, MD;  Location: [place];  Service: Gastroenterology;  Laterality: N/A;  . EYE SURGERY  1954   corrective surgery for crossed eyes  . EYE SURGERY  1959  . LEFT HEART CATH AND CORONARY ANGIOGRAPHY N/A 04/24/2019   Procedure: LEFT HEART CATH AND CORONARY ANGIOGRAPHY;  Surgeon: Martinique, Peter M, MD;  Location: [place] [facility];  Service: Cardiovascular;  Laterality: N/A;  . LEFT HEART CATHETERIZATION WITH CORONARY ANGIOGRAM N/A 01/05/2012   Procedure: LEFT HEART CATHETERIZATION WITH CORONARY ANGIOGRAM;  Surgeon: Larey Dresser, MD;  [facility];  Service: Cardiovascular;  Laterality: N/A;  . REMOVAL OF STONES  05/13/2019   Procedure: REMOVAL OF STONES;  Surgeon: Ronnette Juniper, MD;  Location: [place];  Service: Gastroenterology;;  . [name]  05/13/2019   Procedure: [name];  Surgeon: Ronnette Juniper, MD;  Location: [facility] ENDOSCOPY;  Service: Gastroenterology;;       Family History  Problem Relation Age of Onset  . Heart attack Mother 59  . Diabetes Mother   . Heart disease Mother   . Hyperlipidemia Father   . Hypertension Father   . Diabetes Maternal Grandfather   . Heart disease Maternal Grandfather   . Early death Maternal Grandfather   . Heart attack Maternal Grandfather   . Hearing loss Sister   . Hyperlipidemia Sister   . Depression Brother   . Diabetes Brother   . Hyperlipidemia Brother   . Early death Maternal Grandmother   . Heart disease Maternal Grandmother   . Heart attack Maternal Grandmother   . Hearing loss Sister     Social History   Tobacco Use  . Smoking status:  Light Tobacco Smoker    Packs/day: 0.10    Types: Cigarettes  . Smokeless tobacco: Never Used  . Tobacco comment: almost quit, very rare, 1-2 daily  Vaping Use  . Vaping Use: Never used  Substance Use Topics  . Alcohol use: Not Currently    Alcohol/week: 0.0 standard drinks    Comment: Last drink: 10 days ago   . Drug use: Not Currently    Types: Cocaine    Comment: history of cocaine last use 2014    Home Medications Prior to Admission medications   Medication Sig Start Date End Date Taking? Authorizing Provider  doxycycline (VIBRAMYCIN) 100 MG capsule Take 1 capsule (100 mg total) by mouth 2 (two) times daily for 10 days. 03/28/20 04/07/20 Yes Damere Brandenburg, Gypsy Balsam, PA-C  acetaminophen (TYLENOL) 500 MG tablet Take 1 tablet (500 mg total) by mouth every 6 (six) hours as needed for mild pain, fever or headache. 05/13/19   Arrien, Jimmy Picket, MD  albuterol (VENTOLIN HFA) 108 (90 Base)  MCG/ACT inhaler Inhale 2 puffs into the lungs every 6 (six) hours as needed for wheezing or shortness of breath. 05/19/19   Copland, Gay Filler, MD  aspirin EC 81 MG tablet Take 81 mg by mouth daily.    [provider]  atorvastatin (LIPITOR) 80 MG tablet Take 1 tablet (80 mg total) by mouth daily at 6 PM. 04/26/19   Bhagat, Bhavinkumar, PA  carvedilol (COREG) 6.25 MG tablet Take 1 tablet (6.25 mg total) by mouth 2 (two) times daily. 10/24/19   Martinique, Peter M, MD  cyclobenzaprine (FLEXERIL) 10 MG tablet TAKE 1 TABLET BY MOUTH TWICE DAILY AS NEEDED FOR MUSCLE SPASM 01/12/19   Copland, Gay Filler, MD  diphenhydrAMINE (BENADRYL) 25 mg capsule Take 25 mg by mouth as needed for allergies.    [provider]  ezetimibe (ZETIA) 10 MG tablet Take 1 tablet (10 mg total) by mouth daily. 10/31/19 01/29/20  Martinique, Peter M, MD  fluticasone (FLOVENT HFA) 110 MCG/ACT inhaler Inhale 2 puffs into the lungs 2 (two) times daily. 04/26/19 04/25/20  Leanor Kail, PA  Multiple Vitamin (MULTIVITAMIN WITH  MINERALS) TABS tablet Take 1 tablet by mouth daily.    [provider]  nitroGLYCERIN (NITROSTAT) 0.4 MG SL tablet Place 1 tablet (0.4 mg total) under the tongue every 5 (five) minutes as needed for chest pain. 04/26/19   Leanor Kail, PA  ticagrelor (BRILINTA) 90 MG TABS tablet Take 1 tablet (90 mg total) by mouth 2 (two) times daily. 04/26/19   Leanor Kail, PA    Allergies    Patient has no known allergies.  Review of Systems   Review of Systems  Constitutional: Positive for chills. Negative for activity change and appetite change.  HENT: Negative.   Eyes: Negative.  Negative for visual disturbance.  Respiratory: Positive for cough and shortness of breath. Negative for chest tightness.   Cardiovascular: Negative.   Gastrointestinal: Negative.   Genitourinary: Negative.   Musculoskeletal: Negative.   Skin: Negative.   Allergic/Immunologic: Negative.   Neurological: Positive for headaches. Negative for light-headedness.    Physical Exam Updated Vital Signs BP 128/73   Pulse 88   Temp 98.8 F (37.1 C) (Oral)   Resp 13   Ht _0  (1.803 m)   Wt 68 kg   SpO2 93%   BMI 20.92 kg/m   Physical Exam Vitals and nursing note reviewed.  Constitutional:      Interventions: He is not intubated. HENT:     Head: Normocephalic and atraumatic.     Nose: Nose normal.     Mouth/Throat:     Mouth: Mucous membranes are moist.     Pharynx: Oropharynx is clear. Uvula midline. No oropharyngeal exudate or posterior oropharyngeal erythema.  Eyes:     General:        Right eye: No discharge.        Left eye: No discharge.     Conjunctiva/sclera: Conjunctivae normal.     Pupils: Pupils are equal, round, and reactive to light.  Neck:     Trachea: Trachea and phonation normal.  Cardiovascular:     Rate and Rhythm: Normal rate and regular rhythm.     Pulses: Normal pulses.          Radial pulses are 2+ on the right side and 2+ on the left side.       Dorsalis pedis  pulses are 2+ on the right side and 2+ on the left side.     Heart sounds:  Murmur heard.   Systolic murmur is present with a grade of 1/6.   Pulmonary:     Effort: Pulmonary effort is normal. Prolonged expiration present. No tachypnea, accessory muscle usage, respiratory distress or retractions. He is not intubated.     Breath sounds: Examination of the right-middle field reveals rales. Examination of the right-lower field reveals wheezing. Examination of the left-lower field reveals wheezing. Wheezing and rales present.  Abdominal:     General: Bowel sounds are normal. There is no distension.     Palpations: Abdomen is soft.     Tenderness: There is no abdominal tenderness. There is no right CVA tenderness, left CVA tenderness, guarding or rebound.  Musculoskeletal:        General: No deformity.     Cervical back: Neck supple. No rigidity, tenderness or crepitus. No pain with movement or spinous process tenderness.     Right lower leg: No edema.     Left lower leg: No edema.  Lymphadenopathy:     Cervical: No cervical adenopathy.  Skin:    General: Skin is warm and dry.     Capillary Refill: Capillary refill takes less than 2 seconds.  Neurological:     General: No focal deficit present.     Mental Status: He is alert and oriented to person, place, and time. Mental status is at baseline.  Psychiatric:        Mood and Affect: Mood normal.     ED Results / Procedures / Treatments   Labs (all labs ordered are listed, but only abnormal results are displayed) Labs Reviewed  BASIC METABOLIC PANEL - Abnormal; Notable for the following components:      Result Value   Sodium 131 (*)    Chloride 97 (*)    Creatinine, Ser 1.32 (*)    GFR, Estimated 58 (*)    All other components within normal limits  SARS CORONAVIRUS 2 BY RT PCR [facility] [place] ORDER, [place] LAB)  CBC WITH DIFFERENTIAL/PLATELET    EKG EKG Interpretation  Date/Time:  Wednesday March 28 2020  11:26:47 EST Ventricular Rate:  77 PR Interval:    QRS Duration: 102 QT Interval:  398 QTC Calculation: 451 R Axis:   38 Text Interpretation: Sinus rhythm Atrial premature complex Probable inferior infarct, old Confirmed by Lacretia Leigh ([id number]) on 03/28/2020 12:44:25 PM   Radiology DG Chest Portable 1 View  Result Date: 03/28/2020 CLINICAL DATA:  Cough. EXAM: PORTABLE CHEST 1 VIEW COMPARISON:  Chest x-ray 05/09/2019, 04/24/2019.  CT 11/28/2016. FINDINGS: Mediastinum and hilar structures normal. Heart size stable. No pulmonary venous congestion. Mild bilateral interstitial prominence a component of which may be chronic. Active pneumonitis cannot be excluded. Mild left base atelectasis and or infiltrate. No pleural effusion or pneumothorax. Degenerative change and scoliosis thoracic spine. IMPRESSION: Mild bilateral interstitial prominence a component of which may be chronic. Active pneumonitis cannot be excluded. Mild left base atelectasis and or infiltrate. Electronically Signed   By: Marcello Moores  Register   On: 03/28/2020 07:01    Procedures Procedures   Medications Ordered in ED Medications  AeroChamber Plus Flo-Vu Medium MISC 1 each (has no administration in time range)  albuterol (VENTOLIN HFA) 108 (90 Base) MCG/ACT inhaler 2 puff (2 puffs Inhalation Given 03/28/20 1131)  doxycycline (VIBRA-TABS) tablet 100 mg (100 mg Oral Given 03/28/20 1334)    ED Course  I have reviewed the triage vital signs and the nursing notes.  Pertinent labs & imaging results that were available  during my care of the patient were reviewed by me and considered in my medical decision making (see chart for details).    MDM Rules/Calculators/A&P                         70 year-old male who presents with concern for 4 days of productive cough, chills.  Patient is vaccinated against COVID-19.  Differential diagnosis for emergent cause of cough includes but is not limited to upper respiratory infection, lower  respiratory infection, allergies, asthma, irritants, foreign body, medications such as ACE inhibitors, reflux, asthma, CHF, lung cancer, interstitial lung disease, psychiatric causes, postnasal drip and postinfectious bronchospasm.   Must consider COVID-19 infection, as this patient is seen in the context of the global pandemic. Patient has no known exposures; has been vaccinated against COVID-19.  Patient mildly hypertensive on intake to 144/71.  Vital signs otherwise normal.  The time of my initial evaluation patient is resting comfortably in his hospital bed.  He is not in any acute distress.  Cardiac exam with mild systolic murmur.  Pulmonary exam with mild wheezing the lung bases, rales in the right lung field.  Patient is neurovascularly intact in all 4 extremities.  Chest x-ray obtained in triage revealed mild bilateral interstitial prominence, active pneumonitis, left base atelectasis or infiltrate.  EKG with sinus rhythm, CBC without leukocytosis, BMP with mild elevation in creatinine to 1.3, previously 0.9.  Patient tested negative for COVID-19.  Do not feel etiology of patient's symptoms are emergent in nature; suspect CAP. Given reassuring physical exam, vital signs, laboratory studies, no further work-up is warranted in the emergency department of time.  Will discharge on antibiotic therapy for CAP coverage, recommend close PCP follow-up.  [place] voiced understanding of his medical evaluation and treatment plan.  Each of his questions were answered to his expressed satisfaction.  Return precautions were given.  Patient is stable and appropriate for discharge at this time.  Mitchelle Goerner was evaluated in Emergency Department on 03/28/2020 for the symptoms described in the history of present illness. He was evaluated in the context of the global COVID-19 pandemic, which necessitated consideration that the patient might be at risk for infection with the SARS-CoV-2 virus that causes COVID-19.  Institutional protocols and algorithms that pertain to the evaluation of patients at risk for COVID-19 are in a state of rapid change based on information released by regulatory bodies including the CDC and federal and state organizations. These policies and algorithms were followed during the patient's care in the ED.  This chart was dictated using voice recognition software, Dragon. Despite the best efforts of this provider to proofread and correct errors, errors may still occur which can change documentation meaning.  Final Clinical Impression(s) / ED Diagnoses Final diagnoses:  Community acquired pneumonia of right middle lobe of lung    Rx / DC Orders ED Discharge Orders         Ordered    doxycycline (VIBRAMYCIN) 100 MG capsule  2 times daily        03/28/20 [address], Gypsy Balsam, PA-C 03/28/20 1357    Lacretia Leigh, MD 03/30/20 [phone]

## 2020-03-28 NOTE — ED Triage Notes (Signed)
Patient reports cough x a few days but worse tonight, comes in spells, patient forcefully hacking in triage, denies any sick contacts.

## 2020-04-03 NOTE — Progress Notes (Deleted)
[place] [facility] [address] [place], [place] [address] [phone]  Date:  04/05/2020   Name:  Hunter Mcmillan   DOB:  Jul 28, 1950   MRN:  [id number]  PCP:  [name], MD    Chief Complaint: No chief complaint on file.   History of Present Illness:  Hunter Mcmillan is a 70 y.o. very pleasant male patient who presents with the following:  Patient whom I last saw March 2021 History of CAD with NSTEMI 2013 and stents times 05 April 2019, hepatitis C post Harvoni treatment, dyslipidemia, COPD, smoking, CVA Following up today from recent emergency room evaluation-he was seen with several days of cough, He was evaluated and released to home with doxycycline for community-acquired pneumonia He tested negative for COVID-19  Chest film 03/28/2020 as follows EXAM: PORTABLE CHEST 1 VIEW  COMPARISON:  Chest x-ray 05/09/2019, 04/24/2019.  CT 11/28/2016.  FINDINGS: Mediastinum and hilar structures normal. Heart size stable. No pulmonary venous congestion. Mild bilateral interstitial prominence a component of which may be chronic. Active pneumonitis cannot be excluded. Mild left base atelectasis and or infiltrate. No pleural effusion or pneumothorax. Degenerative change and scoliosis thoracic spine.  IMPRESSION: Mild bilateral interstitial prominence a component of which may be chronic. Active pneumonitis cannot be excluded. Mild left base atelectasis and or infiltrate.   His cardiologist is Dr. Martinique, most recent visit in August 1. Coronary artery disease involving native coronary artery of native heart without angina pectoris   2. Essential hypertension   3. Hyperlipidemia with target LDL less than 70   4. Tobacco abuse   5. Ischemic cardiomyopathy    PLAN:    In order of problems listed above: 1. CAD: s/p DES x2 to RCA in February 2021.  Continue aspirin and Brilinta.  On beta blocker and high dose  statin. Fortunately he is having no further gallbladder problems. If he did have recurrent biliary colic we could interrupt Brilinta at this point for cholecystectomy. If he remains asymptomatic I would favor continuing DAPT for one year without interruption.  2. Ischemic cardiomyopathy: EF 40 to 45%.  Continue carvedilol.  Euvolemic on physical exam 3. Hypertension: Blood pressure is elevated today. Recommend increasing Coreg to 6.25 mg bid.  4. Hyperlipidemia: On Lipitor. Follow up fasting lab today. 5. Tobacco abuse: currently smoking about 3 to 4 cigarettes/day.  I encouraged him to continue to try to quit tobacco altogether 6. history of CVA: No recent recurrence 8.   Cholecystitis. S/p ERCP and sphincterotomy. Currently asymptomatic. See #1.   Patient Active Problem List   Diagnosis Date Noted  . Acute cholecystitis 05/09/2019  . Cholelithiases 04/30/2019  . Cholelithiasis 04/30/2019  . RUQ abdominal pain   . Presence of drug-eluting stent in right coronary artery 04/25/2019  . Hyperlipidemia with target LDL less than 70 04/25/2019  . Acute inferior STEMI 04/25/2019  . STEMI involving right coronary artery ([place]) 04/24/2019  . Abdominal aortic aneurysm (AAA) 3.0 cm to 5.5 cm in diameter in male ([place]) 01/06/2017  . Essential hypertension 04/09/2015  . Hyperpigmentation of skin 10/05/2014  . COPD (chronic obstructive pulmonary disease) ([place]) 06/26/2014  . Sacroiliac joint dysfunction of right side 06/26/2014  . Alcoholism ([place])   . Cocaine abuse ([place]) 11/18/2013  . Scrotal mass 07/21/2013  . Cirrhosis ([place]) 05/28/2013  . Routine general medical examination at a health care facility 07/19/2012  . Abnormal TSH 04/09/2012  . Coronary artery disease involving native  heart with unstable angina pectoris ([place]) 01/14/2012  . Hx of non-ST elevation myocardial infarction (NSTEMI) 01/05/2012  . Tobacco abuse 01/05/2012  . Chronic hepatitis C without hepatic coma ([place]) 01/05/2012  .  Depression, recurrent ([place]) 01/05/2012  . Thrombocytopenia ([place]) 01/05/2012    Past Medical History:  Diagnosis Date  . Abdominal pain 05/02/2019  . Alcoholism ([place])   . CAD (coronary artery disease)    a. 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 Chicken pox   . Depression   . ETOH abuse   . Genital warts   . Hepatic steatosis   . Hepatitis C    a. s/p [name] ~ 2003 in [place], [place] => stopped after 6 mos due to neutropenia - viral load noted to be low at that time;  no follow up since;   b. abdominal u/s 11/13:  diffuse hepatic steatosis and/or hepatocellular disease    . Hx of echocardiogram    a. Echo 01/06/12: Mild focal basal hypertrophy the septum, vigorous LV, EF 65-70%, Gr 2 diast dysfn, trivial AI, MAC, trivial MR, trivial TR, PASP 36  . Hyperlipidemia   . Hypertension   . Non-STEMI (non-ST elevated myocardial infarction) ([place])   . Stroke [facility])    "minor" pt reported  . Thrombocytopenia ([place])   . Tobacco abuse     Past Surgical History:  Procedure Laterality Date  . CORONARY ANGIOGRAPHY N/A 04/24/2019   Procedure: CORONARY ANGIOGRAPHY;  Surgeon: Martinique, Peter M, MD;  Location: [facility];  Service: Cardiovascular;  Laterality: N/A;  . CORONARY/GRAFT ACUTE MI REVASCULARIZATION N/A 04/24/2019   Procedure: Coronary/Graft Acute MI Revascularization;  Surgeon: Martinique, Peter M, MD;  Location: [facility];  Service: Cardiovascular;  Laterality: N/A;  . ERCP N/A 05/13/2019   Procedure: ENDOSCOPIC RETROGRADE CHOLANGIOPANCREATOGRAPHY (ERCP);  Surgeon: Ronnette Juniper, MD;  Location: [facility];  Service: Gastroenterology;  Laterality: N/A;  . EYE SURGERY  1954    corrective surgery for crossed eyes  . EYE SURGERY  1959  . LEFT HEART CATH AND CORONARY ANGIOGRAPHY N/A 04/24/2019   Procedure: LEFT HEART CATH AND CORONARY ANGIOGRAPHY;  Surgeon: Martinique, Peter M, MD;  Location: [facility];  Service: Cardiovascular;  Laterality: N/A;  . LEFT HEART CATHETERIZATION WITH CORONARY ANGIOGRAM N/A 01/05/2012   Procedure: LEFT HEART CATHETERIZATION WITH CORONARY ANGIOGRAM;  Surgeon: Larey Dresser, MD;  Location: [facility] CATH LAB;  Service: Cardiovascular;  Laterality: N/A;  . REMOVAL OF STONES  05/13/2019   Procedure: REMOVAL OF STONES;  Surgeon: Ronnette Juniper, MD;  Location: [facility];  Service: Gastroenterology;;  . [name]  05/13/2019   Procedure: [name];  Surgeon: Ronnette Juniper, MD;  Location[facility] ENDOSCOPY;  Service: Gastroenterology;;    Social History   Tobacco Use  . Smoking status: Light Tobacco Smoker    Packs/day: 0.10    Types: Cigarettes  . Smokeless tobacco: Never Used  . Tobacco comment: almost quit, very rare, 1-2 daily  Vaping Use  . Vaping Use: Never used  Substance Use Topics  . Alcohol use: Not Currently    Alcohol/week: 0.0 standard drinks    Comment: Last drink: 10 days ago   .  Drug use: Not Currently    Types: Cocaine    Comment: history of cocaine last use 2014    Family History  Problem Relation Age of Onset  . Heart attack Mother 35  . Diabetes Mother   . Heart disease Mother   . Hyperlipidemia Father   . Hypertension Father   . Diabetes Maternal Grandfather   . Heart disease Maternal Grandfather   . Early death Maternal Grandfather   . Heart attack Maternal Grandfather   . Hearing loss Sister   . Hyperlipidemia Sister   . Depression Brother   . Diabetes Brother   . Hyperlipidemia Brother   . Early death Maternal Grandmother   . Heart disease Maternal Grandmother   . Heart attack Maternal Grandmother   . Hearing loss Sister     No Known Allergies  Medication list has been reviewed and  updated.  Current Outpatient Medications on File Prior to Visit  Medication Sig Dispense Refill  . acetaminophen (TYLENOL) 500 MG tablet Take 1 tablet (500 mg total) by mouth every 6 (six) hours as needed for mild pain, fever or headache. 20 tablet 0  . albuterol (VENTOLIN HFA) 108 (90 Base) MCG/ACT inhaler Inhale 2 puffs into the lungs every 6 (six) hours as needed for wheezing or shortness of breath. 18 g 6  . aspirin EC 81 MG tablet Take 81 mg by mouth daily.    Marland Kitchen atorvastatin (LIPITOR) 80 MG tablet Take 1 tablet (80 mg total) by mouth daily at 6 PM. 30 tablet 6  . carvedilol (COREG) 6.25 MG tablet Take 1 tablet (6.25 mg total) by mouth 2 (two) times daily. 180 tablet 3  . cyclobenzaprine (FLEXERIL) 10 MG tablet TAKE 1 TABLET BY MOUTH TWICE DAILY AS NEEDED FOR MUSCLE SPASM 30 tablet 3  . diphenhydrAMINE (BENADRYL) 25 mg capsule Take 25 mg by mouth as needed for allergies.    Marland Kitchen doxycycline (VIBRAMYCIN) 100 MG capsule Take 1 capsule (100 mg total) by mouth 2 (two) times daily for 10 days. 20 capsule 0  . ezetimibe (ZETIA) 10 MG tablet Take 1 tablet (10 mg total) by mouth daily. 90 tablet 3  . fluticasone (FLOVENT HFA) 110 MCG/ACT inhaler Inhale 2 puffs into the lungs 2 (two) times daily. 1 Inhaler 3  . Multiple Vitamin (MULTIVITAMIN WITH MINERALS) TABS tablet Take 1 tablet by mouth daily.    . nitroGLYCERIN (NITROSTAT) 0.4 MG SL tablet Place 1 tablet (0.4 mg total) under the tongue every 5 (five) minutes as needed for chest pain. 25 tablet 1  . ticagrelor (BRILINTA) 90 MG TABS tablet Take 1 tablet (90 mg total) by mouth 2 (two) times daily. 60 tablet 11   No current facility-administered medications on file prior to visit.    Review of Systems:  As per HPI- otherwise negative.   Physical Examination: There were no vitals filed for this visit. There were no vitals filed for this visit. There is no height or weight on file to calculate BMI. Ideal Body Weight:    GEN: no acute  distress. HEENT: Atraumatic, Normocephalic.  Ears and Nose: No external deformity. CV: RRR, No M/G/R. No JVD. No thrill. No extra heart sounds. PULM: CTA B, no wheezes, crackles, rhonchi. No retractions. No resp. distress. No accessory muscle use. ABD: S, NT, ND, +BS. No rebound. No HSM. EXTR: No c/c/e PSYCH: Normally interactive. Conversant.    Assessment and Plan: *** This visit occurred during the SARS-CoV-2 public health emergency.  Safety protocols were in  place, including screening questions prior to the visit, additional usage of staff PPE, and extensive cleaning of exam room while observing appropriate contact time as indicated for disinfecting solutions.    Signed Lamar Blinks, MD

## 2020-04-05 ENCOUNTER — Inpatient Hospital Stay: Payer: Medicare PPO | Admitting: Family Medicine

## 2020-04-05 DIAGNOSIS — Z09 Encounter for follow-up examination after completed treatment for conditions other than malignant neoplasm: Secondary | ICD-10-CM

## 2020-04-05 DIAGNOSIS — J449 Chronic obstructive pulmonary disease, unspecified: Secondary | ICD-10-CM

## 2020-04-05 DIAGNOSIS — Z720 Tobacco use: Secondary | ICD-10-CM

## 2020-04-05 DIAGNOSIS — I10 Essential (primary) hypertension: Secondary | ICD-10-CM

## 2020-04-29 NOTE — Patient Instructions (Incomplete)
It was great to see you again today Colon cancer screening due towards the end of this year I recommend getting the shingles vaccine at your pharmacy   Health Maintenance After Age 71 After age 59, you are at a higher risk for certain long-term diseases and infections as well as injuries from falls. Falls are a major cause of broken bones and head injuries in people who are older than age 39. Getting regular preventive care can help to keep you healthy and well. Preventive care includes getting regular testing and making lifestyle changes as recommended by your health care provider. Talk with your health care provider about:  Which screenings and tests you should have. A screening is a test that checks for a disease when you have no symptoms.  A diet and exercise plan that is right for you. What should I know about screenings and tests to prevent falls? Screening and testing are the best ways to find a health problem early. Early diagnosis and treatment give you the best chance of managing medical conditions that are common after age 74. Certain conditions and lifestyle choices may make you more likely to have a fall. Your health care provider may recommend:  Regular vision checks. Poor vision and conditions such as cataracts can make you more likely to have a fall. If you wear glasses, make sure to get your prescription updated if your vision changes.  Medicine review. Work with your health care provider to regularly review all of the medicines you are taking, including over-the-counter medicines. Ask your health care provider about any side effects that may make you more likely to have a fall. Tell your health care provider if any medicines that you take make you feel dizzy or sleepy.  Osteoporosis screening. Osteoporosis is a condition that causes the bones to get weaker. This can make the bones weak and cause them to break more easily.  Blood pressure screening. Blood pressure changes and  medicines to control blood pressure can make you feel dizzy.  Strength and balance checks. Your health care provider may recommend certain tests to check your strength and balance while standing, walking, or changing positions.  Foot health exam. Foot pain and numbness, as well as not wearing proper footwear, can make you more likely to have a fall.  Depression screening. You may be more likely to have a fall if you have a fear of falling, feel emotionally low, or feel unable to do activities that you used to do.  Alcohol use screening. Using too much alcohol can affect your balance and may make you more likely to have a fall. What actions can I take to lower my risk of falls? General instructions  Talk with your health care provider about your risks for falling. Tell your health care provider if: ? You fall. Be sure to tell your health care provider about all falls, even ones that seem minor. ? You feel dizzy, sleepy, or off-balance.  Take over-the-counter and prescription medicines only as told by your health care provider. These include any supplements.  Eat a healthy diet and maintain a healthy weight. A healthy diet includes low-fat dairy products, low-fat (lean) meats, and fiber from whole grains, beans, and lots of fruits and vegetables. Home safety  Remove any tripping hazards, such as rugs, cords, and clutter.  Install safety equipment such as grab bars in bathrooms and safety rails on stairs.  Keep rooms and walkways well-lit. Activity  Follow a regular exercise program to stay  fit. This will help you maintain your balance. Ask your health care provider what types of exercise are appropriate for you.  If you need a cane or walker, use it as recommended by your health care provider.  Wear supportive shoes that have nonskid soles.   Lifestyle  Do not drink alcohol if your health care provider tells you not to drink.  If you drink alcohol, limit how much you have: ? 0-1  drink a day for women. ? 0-2 drinks a day for men.  Be aware of how much alcohol is in your drink. In the [place] one drink equals one typical bottle of beer (12 oz), one-half glass of wine (5 oz), or one shot of hard liquor (1 oz).  Do not use any products that contain nicotine or tobacco, such as cigarettes and e-cigarettes. If you need help quitting, ask your health care provider. Summary  Having a healthy lifestyle and getting preventive care can help to protect your health and wellness after age 41.  Screening and testing are the best way to find a health problem early and help you avoid having a fall. Early diagnosis and treatment give you the best chance for managing medical conditions that are more common for people who are older than age 65.  Falls are a major cause of broken bones and head injuries in people who are older than age 22. Take precautions to prevent a fall at home.  Work with your health care provider to learn what changes you can make to improve your health and wellness and to prevent falls. This information is not intended to replace advice given to you by your health care provider. Make sure you discuss any questions you have with your health care provider. Document Revised: 06/10/2018 Document Reviewed: 12/31/2016 Elsevier Patient Education  2021 [employer].

## 2020-04-29 NOTE — Progress Notes (Deleted)
[place] at [facility] [address] [place], [place] [address] [phone]  Date:  05/02/2020   Name:  Hunter Mcmillan   DOB:  1950-08-18   MRN:  [id number]  PCP:  [name], MD    Chief Complaint: No chief complaint on file.   History of Present Illness:  Hunter Mcmillan is a 70 y.o. very pleasant male patient who presents with the following:  Patient here today for physical exam Last by myself about 1 year ago History of CAD/STEMI status post PCI, hepatitis C status post Harvoni treatment, cholecystitis status post ERCP and sphincterectomy  He saw his cardiologist, Dr. Martinique, in August 1. CAD: s/p DES x2 to RCA in February 2021.  Continue aspirin and Brilinta.  On beta blocker and high dose statin. Fortunately he is having no further gallbladder problems. If he did have recurrent biliary colic we could interrupt Brilinta at this point for cholecystectomy. If he remains asymptomatic I would favor continuing DAPT for one year without interruption.  2. Ischemic cardiomyopathy: EF 40 to 45%.  Continue carvedilol.  Euvolemic on physical exam 3. Hypertension: Blood pressure is elevated today. Recommend increasing Coreg to 6.25 mg bid.  4. Hyperlipidemia: On Lipitor. Follow up fasting lab today   He was seen in the ER at the end of January with concern of cough He was given antibiotics for presumed CAP, discharged to home COVID-19 negative at that time  COVID-19 series Flu vaccine Colon cancer screening due later this year, remind patient Recommend Shingrix Patient Active Problem List   Diagnosis Date Noted  . Acute cholecystitis 05/09/2019  . Cholelithiases 04/30/2019  . Cholelithiasis 04/30/2019  . RUQ abdominal pain   . Presence of drug-eluting stent in right coronary artery 04/25/2019  . Hyperlipidemia with target LDL less than 70 04/25/2019  . Acute inferior STEMI 04/25/2019  . STEMI involving right coronary  artery ([place]) 04/24/2019  . Abdominal aortic aneurysm (AAA) 3.0 cm to 5.5 cm in diameter in male ([place]) 01/06/2017  . Essential hypertension 04/09/2015  . Hyperpigmentation of skin 10/05/2014  . COPD (chronic obstructive pulmonary disease) ([place]) 06/26/2014  . Sacroiliac joint dysfunction of right side 06/26/2014  . Alcoholism ([place])   . Cocaine abuse ([place]) 11/18/2013  . Scrotal mass 07/21/2013  . Cirrhosis ([place]) 05/28/2013  . Routine general medical examination at a health care facility 07/19/2012  . Abnormal TSH 04/09/2012  . Coronary artery disease involving native heart with unstable angina pectoris ([place]) 01/14/2012  . Hx of non-ST elevation myocardial infarction (NSTEMI) 01/05/2012  . Tobacco abuse 01/05/2012  . Chronic hepatitis C without hepatic coma ([place]) 01/05/2012  . Depression, recurrent ([place]) 01/05/2012  . Thrombocytopenia ([place]) 01/05/2012    Past Medical History:  Diagnosis Date  . Abdominal pain 05/02/2019  . Alcoholism ([place])   . CAD (coronary artery disease)    a. 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 Chicken pox   . Depression   . ETOH abuse   . Genital warts   . Hepatic steatosis   . Hepatitis C    a. s/p [name] ~ 2003 in  [place], [place] => stopped after 6 mos due to neutropenia - viral load noted to be low at that time;  no follow up since;   b. abdominal u/s 11/13:  diffuse hepatic steatosis and/or hepatocellular disease    . Hx of echocardiogram    a. Echo 01/06/12: Mild focal basal hypertrophy the septum, vigorous LV, EF 65-70%, Gr 2 diast dysfn, trivial AI, MAC, trivial MR, trivial TR, PASP 36  . Hyperlipidemia   . Hypertension   . Non-STEMI (non-ST  elevated myocardial infarction) ([place])   . Stroke [facility])    "minor" pt reported  . Thrombocytopenia ([place])   . Tobacco abuse     Past Surgical History:  Procedure Laterality Date  . CORONARY ANGIOGRAPHY N/A 04/24/2019   Procedure: CORONARY ANGIOGRAPHY;  Surgeon: Martinique, Peter M, MD;  Location: [place] [facility];  Service: Cardiovascular;  Laterality: N/A;  . CORONARY/GRAFT ACUTE MI REVASCULARIZATION N/A 04/24/2019   Procedure: Coronary/Graft Acute MI Revascularization;  Surgeon: Martinique, Peter M, MD;  Location: [place] [facility];  Service: Cardiovascular;  Laterality: N/A;  . ERCP N/A 05/13/2019   Procedure: ENDOSCOPIC RETROGRADE CHOLANGIOPANCREATOGRAPHY (ERCP);  Surgeon: Ronnette Juniper, MD;  Location: [place];  Service: Gastroenterology;  Laterality: N/A;  . EYE SURGERY  1954   corrective surgery for crossed eyes  . EYE SURGERY  1959  . LEFT HEART CATH AND CORONARY ANGIOGRAPHY N/A 04/24/2019   Procedure: LEFT HEART CATH AND CORONARY ANGIOGRAPHY;  Surgeon: Martinique, Peter M, MD;  Location: [place] [facility];  Service: Cardiovascular;  Laterality: N/A;  . LEFT HEART CATHETERIZATION WITH CORONARY ANGIOGRAM N/A 01/05/2012   Procedure: LEFT HEART CATHETERIZATION WITH CORONARY ANGIOGRAM;  Surgeon: Larey Dresser, MD;  Location: [facility] CATH LAB;  Service: Cardiovascular;  Laterality: N/A;  . REMOVAL OF STONES  05/13/2019   Procedure: REMOVAL OF STONES;  Surgeon: Ronnette Juniper, MD;  Location: [place];  Service: Gastroenterology;;  . [name]  05/13/2019   Procedure: [name];  Surgeon: Ronnette Juniper, MD;  Location: [facility]OSCOPY;  Service: Gastroenterology;;    Social History   Tobacco Use  . Smoking status: Light Tobacco Smoker    Packs/day: 0.10    Types: Cigarettes  . Smokeless tobacco: Never Used  . Tobacco comment: almost quit, very rare, 1-2 daily  Vaping Use  . Vaping Use: Never used  Substance Use Topics  . Alcohol use: Not Currently    Alcohol/week: 0.0 standard drinks     Comment: Last drink: 10 days ago   . Drug use: Not Currently    Types: Cocaine    Comment: history of cocaine last use 2014    Family History  Problem Relation Age of Onset  . Heart attack Mother 23  . Diabetes Mother   . Heart disease Mother   . Hyperlipidemia Father   . Hypertension Father   . Diabetes Maternal Grandfather   . Heart disease Maternal Grandfather   . Early death Maternal Grandfather   . Heart attack Maternal Grandfather   . Hearing loss Sister   . Hyperlipidemia Sister   . Depression Brother   . Diabetes Brother   . Hyperlipidemia Brother   . Early death Maternal Grandmother   . Heart disease Maternal Grandmother   . Heart attack Maternal Grandmother   . Hearing loss Sister     No Known Allergies  Medication list has been reviewed and updated.  Current Outpatient Medications on File Prior to Visit  Medication Sig Dispense Refill  . acetaminophen (TYLENOL) 500  MG tablet Take 1 tablet (500 mg total) by mouth every 6 (six) hours as needed for mild pain, fever or headache. 20 tablet 0  . albuterol (VENTOLIN HFA) 108 (90 Base) MCG/ACT inhaler Inhale 2 puffs into the lungs every 6 (six) hours as needed for wheezing or shortness of breath. 18 g 6  . aspirin EC 81 MG tablet Take 81 mg by mouth daily.    Marland Kitchen atorvastatin (LIPITOR) 80 MG tablet Take 1 tablet (80 mg total) by mouth daily at 6 PM. 30 tablet 6  . carvedilol (COREG) 6.25 MG tablet Take 1 tablet (6.25 mg total) by mouth 2 (two) times daily. 180 tablet 3  . cyclobenzaprine (FLEXERIL) 10 MG tablet TAKE 1 TABLET BY MOUTH TWICE DAILY AS NEEDED FOR MUSCLE SPASM 30 tablet 3  . diphenhydrAMINE (BENADRYL) 25 mg capsule Take 25 mg by mouth as needed for allergies.    Marland Kitchen ezetimibe (ZETIA) 10 MG tablet Take 1 tablet (10 mg total) by mouth daily. 90 tablet 3  . fluticasone (FLOVENT HFA) 110 MCG/ACT inhaler Inhale 2 puffs into the lungs 2 (two) times daily. 1 Inhaler 3  . Multiple Vitamin (MULTIVITAMIN WITH MINERALS)  TABS tablet Take 1 tablet by mouth daily.    . nitroGLYCERIN (NITROSTAT) 0.4 MG SL tablet Place 1 tablet (0.4 mg total) under the tongue every 5 (five) minutes as needed for chest pain. 25 tablet 1  . ticagrelor (BRILINTA) 90 MG TABS tablet Take 1 tablet (90 mg total) by mouth 2 (two) times daily. 60 tablet 11   No current facility-administered medications on file prior to visit.    Review of Systems:  As per HPI- otherwise negative.   Physical Examination: There were no vitals filed for this visit. There were no vitals filed for this visit. There is no height or weight on file to calculate BMI. Ideal Body Weight:    GEN: no acute distress. HEENT: Atraumatic, Normocephalic.  Ears and Nose: No external deformity. CV: RRR, No M/G/R. No JVD. No thrill. No extra heart sounds. PULM: CTA B, no wheezes, crackles, rhonchi. No retractions. No resp. distress. No accessory muscle use. ABD: S, NT, ND, +BS. No rebound. No HSM. EXTR: No c/c/e PSYCH: Normally interactive. Conversant.    Assessment and Plan: *** This visit occurred during the SARS-CoV-2 public health emergency.  Safety protocols were in place, including screening questions prior to the visit, additional usage of staff PPE, and extensive cleaning of exam room while observing appropriate contact time as indicated for disinfecting solutions.    Signed Lamar Blinks, MD

## 2020-05-02 ENCOUNTER — Encounter: Payer: Medicare PPO | Admitting: Family Medicine

## 2020-05-02 DIAGNOSIS — K8020 Calculus of gallbladder without cholecystitis without obstruction: Secondary | ICD-10-CM

## 2020-05-02 DIAGNOSIS — Z0000 Encounter for general adult medical examination without abnormal findings: Secondary | ICD-10-CM

## 2020-05-02 DIAGNOSIS — D696 Thrombocytopenia, unspecified: Secondary | ICD-10-CM

## 2020-05-02 DIAGNOSIS — Z131 Encounter for screening for diabetes mellitus: Secondary | ICD-10-CM

## 2020-05-02 DIAGNOSIS — Z125 Encounter for screening for malignant neoplasm of prostate: Secondary | ICD-10-CM

## 2020-05-02 DIAGNOSIS — J449 Chronic obstructive pulmonary disease, unspecified: Secondary | ICD-10-CM

## 2020-05-02 DIAGNOSIS — I252 Old myocardial infarction: Secondary | ICD-10-CM

## 2020-05-02 DIAGNOSIS — E785 Hyperlipidemia, unspecified: Secondary | ICD-10-CM

## 2020-05-02 DIAGNOSIS — I10 Essential (primary) hypertension: Secondary | ICD-10-CM

## 2020-05-06 NOTE — Patient Instructions (Incomplete)
It was good to see you today- I will be in touch with your labs asap  Please consider getting the shingles vaccine at your pharmacy at your convenience  Health Maintenance After Age 70 After age 43, you are at a higher risk for certain long-term diseases and infections as well as injuries from falls. Falls are a major cause of broken bones and head injuries in people who are older than age 21. Getting regular preventive care can help to keep you healthy and well. Preventive care includes getting regular testing and making lifestyle changes as recommended by your health care provider. Talk with your health care provider about:  Which screenings and tests you should have. A screening is a test that checks for a disease when you have no symptoms.  A diet and exercise plan that is right for you. What should I know about screenings and tests to prevent falls? Screening and testing are the best ways to find a health problem early. Early diagnosis and treatment give you the best chance of managing medical conditions that are common after age 2. Certain conditions and lifestyle choices may make you more likely to have a fall. Your health care provider may recommend:  Regular vision checks. Poor vision and conditions such as cataracts can make you more likely to have a fall. If you wear glasses, make sure to get your prescription updated if your vision changes.  Medicine review. Work with your health care provider to regularly review all of the medicines you are taking, including over-the-counter medicines. Ask your health care provider about any side effects that may make you more likely to have a fall. Tell your health care provider if any medicines that you take make you feel dizzy or sleepy.  Osteoporosis screening. Osteoporosis is a condition that causes the bones to get weaker. This can make the bones weak and cause them to break more easily.  Blood pressure screening. Blood pressure changes and  medicines to control blood pressure can make you feel dizzy.  Strength and balance checks. Your health care provider may recommend certain tests to check your strength and balance while standing, walking, or changing positions.  Foot health exam. Foot pain and numbness, as well as not wearing proper footwear, can make you more likely to have a fall.  Depression screening. You may be more likely to have a fall if you have a fear of falling, feel emotionally low, or feel unable to do activities that you used to do.  Alcohol use screening. Using too much alcohol can affect your balance and may make you more likely to have a fall. What actions can I take to lower my risk of falls? General instructions  Talk with your health care provider about your risks for falling. Tell your health care provider if: ? You fall. Be sure to tell your health care provider about all falls, even ones that seem minor. ? You feel dizzy, sleepy, or off-balance.  Take over-the-counter and prescription medicines only as told by your health care provider. These include any supplements.  Eat a healthy diet and maintain a healthy weight. A healthy diet includes low-fat dairy products, low-fat (lean) meats, and fiber from whole grains, beans, and lots of fruits and vegetables. Home safety  Remove any tripping hazards, such as rugs, cords, and clutter.  Install safety equipment such as grab bars in bathrooms and safety rails on stairs.  Keep rooms and walkways well-lit. Activity  Follow a regular exercise program to  stay fit. This will help you maintain your balance. Ask your health care provider what types of exercise are appropriate for you.  If you need a cane or walker, use it as recommended by your health care provider.  Wear supportive shoes that have nonskid soles.   Lifestyle  Do not drink alcohol if your health care provider tells you not to drink.  If you drink alcohol, limit how much you have: ? 0-1  drink a day for women. ? 0-2 drinks a day for men.  Be aware of how much alcohol is in your drink. In the [place] one drink equals one typical bottle of beer (12 oz), one-half glass of wine (5 oz), or one shot of hard liquor (1 oz).  Do not use any products that contain nicotine or tobacco, such as cigarettes and e-cigarettes. If you need help quitting, ask your health care provider. Summary  Having a healthy lifestyle and getting preventive care can help to protect your health and wellness after age 13.  Screening and testing are the best way to find a health problem early and help you avoid having a fall. Early diagnosis and treatment give you the best chance for managing medical conditions that are more common for people who are older than age 37.  Falls are a major cause of broken bones and head injuries in people who are older than age 13. Take precautions to prevent a fall at home.  Work with your health care provider to learn what changes you can make to improve your health and wellness and to prevent falls. This information is not intended to replace advice given to you by your health care provider. Make sure you discuss any questions you have with your health care provider. Document Revised: 06/10/2018 Document Reviewed: 12/31/2016 Elsevier Patient Education  2021 [employer].

## 2020-05-06 NOTE — Progress Notes (Deleted)
[place] at [facility] [address], [place], [place] [address] [phone]  Date:  05/09/2020   Name:  Hunter Mcmillan   DOB:  [birth year]   MRN:  [id number]  PCP:  [name], MD    Chief Complaint: No chief complaint on file.   History of Present Illness:  Hunter Mcmillan is a 70 y.o. very pleasant male patient who presents with the following:  Here today for a CPE Last seen by myself about 1 year ago History of CAD status post PCI, hepatitis C post Harvoni treatment, choledocholithiasis  He was in the ER 1/26 with CAP-he was discharged home with doxycycline  Flu vaccine covid series Colon last year  BMP, CBC done in January of this year  He was seen by cardiology in August, Dr. Martinique 1. CAD: s/p DES x2 to RCA in February 2021.  Continue aspirin and Brilinta.  On beta blocker and high dose statin. Fortunately he is having no further gallbladder problems. If he did have recurrent biliary colic we could interrupt Brilinta at this point for cholecystectomy. If he remains asymptomatic I would favor continuing DAPT for one year without interruption.  2. Ischemic cardiomyopathy: EF 40 to 45%.  Continue carvedilol.  Euvolemic on physical exam 3. Hypertension: Blood pressure is elevated today. Recommend increasing Coreg to 6.25 mg bid.  4. Hyperlipidemia: On Lipitor. Follow up fasting lab today. 5. Tobacco abuse: currently smoking about 3 to 4 cigarettes/day.  I encouraged him to continue to try to quit tobacco altogether 6. history of CVA: No recent recurrence 8.   Cholecystitis. S/p ERCP and sphincterotomy. Currently asymptomatic. See #1.   Patient Active Problem List   Diagnosis Date Noted  . Acute cholecystitis 05/09/2019  . Cholelithiasis 04/30/2019  . RUQ abdominal pain   . Presence of drug-eluting stent in right coronary artery 04/25/2019  . Hyperlipidemia with target LDL less than 70 04/25/2019  . Acute  inferior STEMI 04/25/2019  . STEMI involving right coronary artery ([place]) 04/24/2019  . Abdominal aortic aneurysm (AAA) 3.0 cm to 5.5 cm in diameter in male ([place]) 01/06/2017  . Essential hypertension 04/09/2015  . Hyperpigmentation of skin 10/05/2014  . COPD (chronic obstructive pulmonary disease) ([place]) 06/26/2014  . Sacroiliac joint dysfunction of right side 06/26/2014  . Alcoholism ([place])   . Cocaine abuse ([place]) 11/18/2013  . Scrotal mass 07/21/2013  . Cirrhosis ([place]) 05/28/2013  . Routine general medical examination at a health care facility 07/19/2012  . Abnormal TSH 04/09/2012  . Coronary artery disease involving native heart with unstable angina pectoris ([place]) 01/14/2012  . Hx of non-ST elevation myocardial infarction (NSTEMI) 01/05/2012  . Tobacco abuse 01/05/2012  . Chronic hepatitis C without hepatic coma ([place]) 01/05/2012  . Depression, recurrent ([place]) 01/05/2012  . Thrombocytopenia ([place]) 01/05/2012    Past Medical History:  Diagnosis Date  . Abdominal pain 05/02/2019  . Alcoholism ([place])   . CAD (coronary artery disease)    a. 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 Chicken pox   . Depression   . ETOH abuse   . Genital warts   . Hepatic  steatosis   . Hepatitis C    a. s/p [name] ~ 2003 in [place], [place] => stopped after 6 mos due to neutropenia - viral load noted to be low at that time;  no follow up since;   b. abdominal u/s 11/13:  diffuse hepatic steatosis and/or hepatocellular disease    . Hx of echocardiogram    a. Echo 01/06/12: Mild focal basal hypertrophy the septum, vigorous LV, EF 65-70%, Gr 2 diast dysfn, trivial AI, MAC, trivial MR, trivial TR, PASP 36  .  Hyperlipidemia   . Hypertension   . Non-STEMI (non-ST elevated myocardial infarction) ([place])   . Stroke [facility])    "minor" pt reported  . Thrombocytopenia ([place])   . Tobacco abuse     Past Surgical History:  Procedure Laterality Date  . CORONARY ANGIOGRAPHY N/A 04/24/2019   Procedure: CORONARY ANGIOGRAPHY;  Surgeon: Martinique, Peter M, MD;  Location: [place] [facility];  Service: Cardiovascular;  Laterality: N/A;  . CORONARY/GRAFT ACUTE MI REVASCULARIZATION N/A 04/24/2019   Procedure: Coronary/Graft Acute MI Revascularization;  Surgeon: Martinique, Peter M, MD;  Location: [place] [facility];  Service: Cardiovascular;  Laterality: N/A;  . ERCP N/A 05/13/2019   Procedure: ENDOSCOPIC RETROGRADE CHOLANGIOPANCREATOGRAPHY (ERCP);  Surgeon: Ronnette Juniper, MD;  Location: [place];  Service: Gastroenterology;  Laterality: N/A;  . EYE SURGERY  1954   corrective surgery for crossed eyes  . EYE SURGERY  1959  . LEFT HEART CATH AND CORONARY ANGIOGRAPHY N/A 04/24/2019   Procedure: LEFT HEART CATH AND CORONARY ANGIOGRAPHY;  Surgeon: Martinique, Peter M, MD;  Location: [place] [facility];  Service: Cardiovascular;  Laterality: N/A;  . LEFT HEART CATHETERIZATION WITH CORONARY ANGIOGRAM N/A 01/05/2012   Procedure: LEFT HEART CATHETERIZATION WITH CORONARY ANGIOGRAM;  Surgeon: Larey Dresser, MD;  Location: [facility] CATH LAB;  Service: Cardiovascular;  Laterality: N/A;  . REMOVAL OF STONES  05/13/2019   Procedure: REMOVAL OF STONES;  Surgeon: Ronnette Juniper, MD;  Location: [place];  Service: Gastroenterology;;  . [name]  05/13/2019   Procedure: [name];  Surgeon: Ronnette Juniper, MD;  Location: [facility] ENDOSCOPY;  Service: Gastroenterology;;    Social History   Tobacco Use  . Smoking status: Light Tobacco Smoker    Packs/day: 0.10    Types: Cigarettes  . Smokeless tobacco: Never Used  . Tobacco comment: almost quit, very rare, 1-2 daily  Vaping Use  . Vaping Use: Never used  Substance Use Topics  . Alcohol use:  Not Currently    Alcohol/week: 0.0 standard drinks    Comment: Last drink: 10 days ago   . Drug use: Not Currently    Types: Cocaine    Comment: history of cocaine last use 2014    Family History  Problem Relation Age of Onset  . Heart attack Mother 25  . Diabetes Mother   . Heart disease Mother   . Hyperlipidemia Father   . Hypertension Father   . Diabetes Maternal Grandfather   . Heart disease Maternal Grandfather   . Early death Maternal Grandfather   . Heart attack Maternal Grandfather   . Hearing loss Sister   . Hyperlipidemia Sister   . Depression Brother   . Diabetes Brother   . Hyperlipidemia Brother   . Early death Maternal Grandmother   . Heart disease Maternal Grandmother   . Heart attack Maternal Grandmother   . Hearing loss Sister     No Known Allergies  Medication list has been reviewed and updated.  Current  Outpatient Medications on File Prior to Visit  Medication Sig Dispense Refill  . acetaminophen (TYLENOL) 500 MG tablet Take 1 tablet (500 mg total) by mouth every 6 (six) hours as needed for mild pain, fever or headache. 20 tablet 0  . albuterol (VENTOLIN HFA) 108 (90 Base) MCG/ACT inhaler Inhale 2 puffs into the lungs every 6 (six) hours as needed for wheezing or shortness of breath. 18 g 6  . aspirin EC 81 MG tablet Take 81 mg by mouth daily.    Marland Kitchen atorvastatin (LIPITOR) 80 MG tablet Take 1 tablet (80 mg total) by mouth daily at 6 PM. 30 tablet 6  . carvedilol (COREG) 6.25 MG tablet Take 1 tablet (6.25 mg total) by mouth 2 (two) times daily. 180 tablet 3  . cyclobenzaprine (FLEXERIL) 10 MG tablet TAKE 1 TABLET BY MOUTH TWICE DAILY AS NEEDED FOR MUSCLE SPASM 30 tablet 3  . diphenhydrAMINE (BENADRYL) 25 mg capsule Take 25 mg by mouth as needed for allergies.    Marland Kitchen ezetimibe (ZETIA) 10 MG tablet Take 1 tablet (10 mg total) by mouth daily. 90 tablet 3  . fluticasone (FLOVENT HFA) 110 MCG/ACT inhaler Inhale 2 puffs into the lungs 2 (two) times daily. 1 Inhaler  3  . Multiple Vitamin (MULTIVITAMIN WITH MINERALS) TABS tablet Take 1 tablet by mouth daily.    . nitroGLYCERIN (NITROSTAT) 0.4 MG SL tablet Place 1 tablet (0.4 mg total) under the tongue every 5 (five) minutes as needed for chest pain. 25 tablet 1  . ticagrelor (BRILINTA) 90 MG TABS tablet Take 1 tablet (90 mg total) by mouth 2 (two) times daily. 60 tablet 11   No current facility-administered medications on file prior to visit.    Review of Systems:  As per HPI- otherwise negative.   Physical Examination: There were no vitals filed for this visit. There were no vitals filed for this visit. There is no height or weight on file to calculate BMI. Ideal Body Weight:    GEN: no acute distress. HEENT: Atraumatic, Normocephalic.  Ears and Nose: No external deformity. CV: RRR, No M/G/R. No JVD. No thrill. No extra heart sounds. PULM: CTA B, no wheezes, crackles, rhonchi. No retractions. No resp. distress. No accessory muscle use. ABD: S, NT, ND, +BS. No rebound. No HSM. EXTR: No c/c/e PSYCH: Normally interactive. Conversant.    Assessment and Plan: *** This visit occurred during the SARS-CoV-2 public health emergency.  Safety protocols were in place, including screening questions prior to the visit, additional usage of staff PPE, and extensive cleaning of exam room while observing appropriate contact time as indicated for disinfecting solutions.    Signed Lamar Blinks, MD

## 2020-05-09 ENCOUNTER — Encounter: Payer: Medicare PPO | Admitting: Family Medicine

## 2020-05-09 DIAGNOSIS — M519 Unspecified thoracic, thoracolumbar and lumbosacral intervertebral disc disorder: Secondary | ICD-10-CM

## 2020-05-09 DIAGNOSIS — I10 Essential (primary) hypertension: Secondary | ICD-10-CM

## 2020-05-09 DIAGNOSIS — K8050 Calculus of bile duct without cholangitis or cholecystitis without obstruction: Secondary | ICD-10-CM

## 2020-05-09 DIAGNOSIS — J449 Chronic obstructive pulmonary disease, unspecified: Secondary | ICD-10-CM

## 2020-05-09 DIAGNOSIS — Z125 Encounter for screening for malignant neoplasm of prostate: Secondary | ICD-10-CM

## 2020-05-09 DIAGNOSIS — Z0000 Encounter for general adult medical examination without abnormal findings: Secondary | ICD-10-CM

## 2020-05-09 DIAGNOSIS — I252 Old myocardial infarction: Secondary | ICD-10-CM

## 2020-05-19 NOTE — Progress Notes (Addendum)
[facility] [address], [place], [place] [address] [phone]  Date:  05/21/2020   Name:  Hunter Mcmillan   DOB:  09/21/1950   MRN:  [id number]  PCP:  [name], MD    Chief Complaint: Annual Exam   History of Present Illness:  Hunter Mcmillan is a 70 y.o. very pleasant male patient who presents with the following:- Here today for follow-up and CPE  Last seen by myself about one year ago- history of complicated CAD, hep C s/p harvoni, HTN, history of CVA, tobacco use  Last cardiology visit in August 1. Coronary artery disease involving native coronary artery of native heart without angina pectoris   2. Essential hypertension   3. Hyperlipidemia with target LDL less than 70   4. Tobacco abuse   5. Ischemic cardiomyopathy    PLAN:    In order of problems listed above 1. CAD: s/p DES x2 to RCA in February 2021.  Continue aspirin and Brilinta.  On beta blocker and high dose statin. Fortunately he is having no further gallbladder problems. If he did have recurrent biliary colic we could interrupt Brilinta at this point for cholecystectomy. If he remains asymptomatic I would favor continuing DAPT for one year without interruption.  2. Ischemic cardiomyopathy: EF 40 to 45%.  Continue carvedilol.  Euvolemic on physical exam 3. Hypertension: Blood pressure is elevated today. Recommend increasing Coreg to 6.25 mg bid.  4. Hyperlipidemia: On Lipitor. Follow up fasting lab today. 5. Tobacco abuse: currently smoking about 3 to 4 cigarettes/day.  I encouraged him to continue to try to quit tobacco altogether 6. history of CVA: No recent recurrence 8.   Cholecystitis. S/p ERCP and sphincterotomy. Currently asymptomatic. See #1.    covid series- done Flu shot colonoscopy due later this year -reminded patient  Pt notes he is overall doing ok- his only concern is that he may have urinary urgency and poor flow He  may have urinary frequency esp at night No pain with urination He wonders if he might have prostate enlargement, would be interested in trying a medication for same  He has not had any further issues with his GB- he did not have to undergo cholecystectomy which is great news He is still smoking about 3-4 cigs a day  He is smoked since he was about 70 years old.  He does have a greater than 30-pack-year history  No recnet CP, he has not used his nitro but could use a Refill of this for fresh supply  He states compliance with his blood pressure and cholesterol medications, except he has been out of Lipitor for a while-we will refill today   Lab Results  Component Value Date   HGBA1C 5.8 (H) 04/24/2019   Lab Results  Component Value Date   PSA 0.31 05/25/2017   PSA 0.78 10/08/2015   PSA 1.18 10/05/2014      Patient Active Problem List   Diagnosis Date Noted  . Acute cholecystitis 05/09/2019  . Cholelithiasis 04/30/2019  . RUQ abdominal pain   . Presence of drug-eluting stent in right coronary artery 04/25/2019  . Hyperlipidemia with target LDL less than 70 04/25/2019  . Acute inferior STEMI 04/25/2019  . STEMI involving right coronary artery ([place]) 04/24/2019  . Abdominal aortic aneurysm (AAA) 3.0 cm to 5.5 cm in diameter in male ([place]) 01/06/2017  . Essential hypertension 04/09/2015  . Hyperpigmentation of skin 10/05/2014  .  COPD (chronic obstructive pulmonary disease) ([place]) 06/26/2014  . Sacroiliac joint dysfunction of right side 06/26/2014  . Alcoholism ([place])   . Cocaine abuse ([place]) 11/18/2013  . Scrotal mass 07/21/2013  . Cirrhosis ([place]) 05/28/2013  . Routine general medical examination at a health care facility 07/19/2012  . Abnormal TSH 04/09/2012  . Coronary artery disease involving native heart with unstable angina pectoris ([place]) 01/14/2012  . Hx of non-ST elevation myocardial infarction (NSTEMI) 01/05/2012  . Tobacco abuse 01/05/2012  . Chronic hepatitis C without  hepatic coma ([place]) 01/05/2012  . Depression, recurrent ([place]) 01/05/2012  . Thrombocytopenia ([place]) 01/05/2012    Past Medical History:  Diagnosis Date  . Abdominal pain 05/02/2019  . Alcoholism ([place])   . CAD (coronary artery disease)    a. 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 Chicken pox   . Depression   . ETOH abuse   . Genital warts   . Hepatic steatosis   . Hepatitis C    a. s/p [name] ~ 2003 in [place], [place] => stopped after 6 mos due to neutropenia - viral load noted to be low at that time;  no follow up since;   b. abdominal u/s 11/13:  diffuse hepatic steatosis and/or hepatocellular disease    . Hx of echocardiogram    a. Echo 01/06/12: Mild focal basal hypertrophy the septum, vigorous LV, EF 65-70%, Gr 2 diast dysfn, trivial AI, MAC, trivial MR, trivial TR, PASP 36  . Hyperlipidemia   . Hypertension   . Non-STEMI (non-ST elevated myocardial infarction) ([place])   . Stroke [facility])    "minor" pt reported  . Thrombocytopenia ([place])   . Tobacco abuse     Past Surgical History:  Procedure Laterality Date  . CORONARY ANGIOGRAPHY N/A 04/24/2019   Procedure: CORONARY ANGIOGRAPHY;  Surgeon: Martinique, Peter M, MD;  Location: [facility];  Service: Cardiovascular;  Laterality: N/A;  . CORONARY/GRAFT ACUTE MI REVASCULARIZATION N/A 04/24/2019   Procedure: Coronary/Graft Acute MI Revascularization;  Surgeon: Martinique, Peter M, MD;  Location: [facility];  Service: Cardiovascular;  Laterality: N/A;  . ERCP N/A 05/13/2019   Procedure: ENDOSCOPIC RETROGRADE CHOLANGIOPANCREATOGRAPHY (ERCP);  Surgeon: Ronnette Juniper, MD;  Location: [facility];  Service: Gastroenterology;  Laterality:  N/A;  . EYE SURGERY  1954   corrective surgery for crossed eyes  . EYE SURGERY  1959  . LEFT HEART CATH AND CORONARY ANGIOGRAPHY N/A 04/24/2019   Procedure: LEFT HEART CATH AND CORONARY ANGIOGRAPHY;  Surgeon: Martinique, Peter M, MD;  Location: [facility];  Service: Cardiovascular;  Laterality: N/A;  . LEFT HEART CATHETERIZATION WITH CORONARY ANGIOGRAM N/A 01/05/2012   Procedure: LEFT HEART CATHETERIZATION WITH CORONARY ANGIOGRAM;  Surgeon: Larey Dresser, MD;  Location: [facility] CATH LAB;  Service: Cardiovascular;  Laterality: N/A;  . REMOVAL OF STONES  05/13/2019   Procedure: REMOVAL OF STONES;  Surgeon: Ronnette Juniper, MD;  Location: [facility];  Service: Gastroenterology;;  . [name]  05/13/2019   Procedure: [name];  Surgeon: Ronnette Juniper, MD;  Location: [facility] ENDOSCOPY;  Service: Gastroenterology;;    Social History   Tobacco Use  . Smoking status: Light Tobacco Smoker  Packs/day: 0.10    Types: Cigarettes  . Smokeless tobacco: Never Used  . Tobacco comment: almost quit, very rare, 1-2 daily  Vaping Use  . Vaping Use: Never used  Substance Use Topics  . Alcohol use: Not Currently    Alcohol/week: 0.0 standard drinks    Comment: Last drink: 10 days ago   . Drug use: Not Currently    Types: Cocaine    Comment: history of cocaine last use 2014    Family History  Problem Relation Age of Onset  . Heart attack Mother 18  . Diabetes Mother   . Heart disease Mother   . Hyperlipidemia Father   . Hypertension Father   . Diabetes Maternal Grandfather   . Heart disease Maternal Grandfather   . Early death Maternal Grandfather   . Heart attack Maternal Grandfather   . Hearing loss Sister   . Hyperlipidemia Sister   . Depression Brother   . Diabetes Brother   . Hyperlipidemia Brother   . Early death Maternal Grandmother   . Heart disease Maternal Grandmother   . Heart attack Maternal Grandmother   . Hearing loss Sister     No Known Allergies  Medication list has  been reviewed and updated.  Current Outpatient Medications on File Prior to Visit  Medication Sig Dispense Refill  . acetaminophen (TYLENOL) 500 MG tablet Take 1 tablet (500 mg total) by mouth every 6 (six) hours as needed for mild pain, fever or headache. 20 tablet 0  . albuterol (VENTOLIN HFA) 108 (90 Base) MCG/ACT inhaler Inhale 2 puffs into the lungs every 6 (six) hours as needed for wheezing or shortness of breath. 18 g 6  . aspirin EC 81 MG tablet Take 81 mg by mouth daily.    . Multiple Vitamin (MULTIVITAMIN WITH MINERALS) TABS tablet Take 1 tablet by mouth daily.    . ticagrelor (BRILINTA) 90 MG TABS tablet Take 1 tablet (90 mg total) by mouth 2 (two) times daily. 60 tablet 11  . carvedilol (COREG) 6.25 MG tablet Take 1 tablet (6.25 mg total) by mouth 2 (two) times daily. (Patient not taking: Reported on 05/21/2020) 180 tablet 3  . diphenhydrAMINE (BENADRYL) 25 mg capsule Take 25 mg by mouth as needed for allergies. (Patient not taking: Reported on 05/21/2020)    . fluticasone (FLOVENT HFA) 110 MCG/ACT inhaler Inhale 2 puffs into the lungs 2 (two) times daily. 1 Inhaler 3   No current facility-administered medications on file prior to visit.    Review of Systems:  As per HPI- otherwise negative.   Physical Examination: Vitals:   05/21/20 1336  BP: 128/80  Pulse: 93  Resp: 19  SpO2: 96%   Vitals:   05/21/20 1336  Weight: 149 lb (67.6 kg)  Height: _0  (1.803 m)   Body mass index is 20.78 kg/m. Ideal Body Weight: Weight in (lb) to have BMI = 25: 178.9  GEN: no acute distress.  Normal weight, looks well HEENT: Atraumatic, Normocephalic.   Bilateral TM wnl, oropharynx normal.  PEERL,EOMI.   Ears and Nose: No external deformity. CV: RRR, No M/G/R. No JVD. No thrill. No extra heart sounds. PULM: CTA B, no wheezes, crackles, rhonchi. No retractions. No resp. distress. No accessory muscle use. Kyphosis is present ABD: S, NT, ND, +BS. No rebound. No HSM. EXTR: No  c/c/e PSYCH: Normally interactive. Conversant.   Assessment and Plan: Physical exam  Essential hypertension - Plan: CBC, Comprehensive metabolic panel  Choledocholithiasis  Hx of non-ST elevation  myocardial infarction (NSTEMI) - Plan: nitroGLYCERIN (NITROSTAT) 0.4 MG SL tablet  Decreased urine stream - Plan: PSA, tamsulosin (FLOMAX) 0.4 MG CAPS capsule  Dyslipidemia - Plan: Lipid panel, atorvastatin (LIPITOR) 80 MG tablet, ezetimibe (ZETIA) 10 MG tablet  Screening for lung cancer - Plan: CT CHEST LUNG CA SCREEN LOW DOSE W/O CM  Screening for diabetes mellitus - Plan: Hemoglobin A1c  Patient seen today for physical exam.  He is stable in general, no further issues with gallbladder colic.  He does follow-up with cardiology Encouraged healthy diet, exercise and tobacco cessation Order lung cancer screening CT Refilled nitro to use if needed Labs pending as above- Will plan further follow- up pending labs. Prescribed Flomax for him to try for LUTS-he will let me know if helpful This visit occurred during the SARS-CoV-2 public health emergency.  Safety protocols were in place, including screening questions prior to the visit, additional usage of staff PPE, and extensive cleaning of exam room while observing appropriate contact time as indicated for disinfecting solutions.  ' Signed Lamar Blinks, MD  Addendum 3/22, received labs as below.  Letter to patient  Results for orders placed or performed in visit on 05/21/20  CBC  Result Value Ref Range   WBC 6.5 4.0 - 10.5 K/uL   RBC 4.49 4.22 - 5.81 Mil/uL   Platelets 118.0 (L) 150.0 - 400.0 K/uL   Hemoglobin 15.6 13.0 - 17.0 g/dL   HCT 45.9 39.0 - 52.0 %   MCV 102.2 (H) 78.0 - 100.0 fl   MCHC 33.9 30.0 - 36.0 g/dL   RDW 15.9 (H) 11.5 - 15.5 %  Comprehensive metabolic panel  Result Value Ref Range   Sodium 139 135 - 145 mEq/L   Potassium 4.3 3.5 - 5.1 mEq/L   Chloride 101 96 - 112 mEq/L   CO2 32 19 - 32 mEq/L   Glucose, Bld  130 (H) 70 - 99 mg/dL   BUN 17 6 - 23 mg/dL   Creatinine, Ser 1.21 0.40 - 1.50 mg/dL   Total Bilirubin 0.9 0.2 - 1.2 mg/dL   Alkaline Phosphatase 83 39 - 117 U/L   AST 23 0 - 37 U/L   ALT 16 0 - 53 U/L   Total Protein 7.1 6.0 - 8.3 g/dL   Albumin 4.1 3.5 - 5.2 g/dL   GFR 60.45 >60.00 mL/min   Calcium 9.6 8.4 - 10.5 mg/dL  Lipid panel  Result Value Ref Range   Cholesterol 127 0 - 200 mg/dL   Triglycerides 68.0 0.0 - 149.0 mg/dL   HDL 34.50 (L) >39.00 mg/dL   VLDL 13.6 0.0 - 40.0 mg/dL   LDL Cholesterol 79 0 - 99 mg/dL   Total CHOL/HDL Ratio 4    NonHDL 92.28   Hemoglobin A1c  Result Value Ref Range   Hgb A1c MFr Bld 5.8 4.6 - 6.5 %  PSA  Result Value Ref Range   PSA 0.77 0.10 - 4.00 ng/mL

## 2020-05-19 NOTE — Patient Instructions (Addendum)
Good to see you again today- I will be in touch with your labs Try the flomax for urine flow- let me know if helpful.  If not helpful you can stop taking it We will set you up for a lung cancer screening CT of your chest Please continue to work on quitting smoking    Health Maintenance After Age 71 After age 20, you are at a higher risk for certain long-term diseases and infections as well as injuries from falls. Falls are a major cause of broken bones and head injuries in people who are older than age 66. Getting regular preventive care can help to keep you healthy and well. Preventive care includes getting regular testing and making lifestyle changes as recommended by your health care provider. Talk with your health care provider about:  Which screenings and tests you should have. A screening is a test that checks for a disease when you have no symptoms.  A diet and exercise plan that is right for you. What should I know about screenings and tests to prevent falls? Screening and testing are the best ways to find a health problem early. Early diagnosis and treatment give you the best chance of managing medical conditions that are common after age 59. Certain conditions and lifestyle choices may make you more likely to have a fall. Your health care provider may recommend:  Regular vision checks. Poor vision and conditions such as cataracts can make you more likely to have a fall. If you wear glasses, make sure to get your prescription updated if your vision changes.  Medicine review. Work with your health care provider to regularly review all of the medicines you are taking, including over-the-counter medicines. Ask your health care provider about any side effects that may make you more likely to have a fall. Tell your health care provider if any medicines that you take make you feel dizzy or sleepy.  Osteoporosis screening. Osteoporosis is a condition that causes the bones to get weaker. This can  make the bones weak and cause them to break more easily.  Blood pressure screening. Blood pressure changes and medicines to control blood pressure can make you feel dizzy.  Strength and balance checks. Your health care provider may recommend certain tests to check your strength and balance while standing, walking, or changing positions.  Foot health exam. Foot pain and numbness, as well as not wearing proper footwear, can make you more likely to have a fall.  Depression screening. You may be more likely to have a fall if you have a fear of falling, feel emotionally low, or feel unable to do activities that you used to do.  Alcohol use screening. Using too much alcohol can affect your balance and may make you more likely to have a fall. What actions can I take to lower my risk of falls? General instructions  Talk with your health care provider about your risks for falling. Tell your health care provider if: ? You fall. Be sure to tell your health care provider about all falls, even ones that seem minor. ? You feel dizzy, sleepy, or off-balance.  Take over-the-counter and prescription medicines only as told by your health care provider. These include any supplements.  Eat a healthy diet and maintain a healthy weight. A healthy diet includes low-fat dairy products, low-fat (lean) meats, and fiber from whole grains, beans, and lots of fruits and vegetables. Home safety  Remove any tripping hazards, such as rugs, cords, and clutter.  Install safety equipment such as grab bars in bathrooms and safety rails on stairs.  Keep rooms and walkways well-lit. Activity  Follow a regular exercise program to stay fit. This will help you maintain your balance. Ask your health care provider what types of exercise are appropriate for you.  If you need a cane or walker, use it as recommended by your health care provider.  Wear supportive shoes that have nonskid soles.   Lifestyle  Do not drink  alcohol if your health care provider tells you not to drink.  If you drink alcohol, limit how much you have: ? 0-1 drink a day for women. ? 0-2 drinks a day for men.  Be aware of how much alcohol is in your drink. In the [place] one drink equals one typical bottle of beer (12 oz), one-half glass of wine (5 oz), or one shot of hard liquor (1 oz).  Do not use any products that contain nicotine or tobacco, such as cigarettes and e-cigarettes. If you need help quitting, ask your health care provider. Summary  Having a healthy lifestyle and getting preventive care can help to protect your health and wellness after age 4.  Screening and testing are the best way to find a health problem early and help you avoid having a fall. Early diagnosis and treatment give you the best chance for managing medical conditions that are more common for people who are older than age 2.  Falls are a major cause of broken bones and head injuries in people who are older than age 32. Take precautions to prevent a fall at home.  Work with your health care provider to learn what changes you can make to improve your health and wellness and to prevent falls. This information is not intended to replace advice given to you by your health care provider. Make sure you discuss any questions you have with your health care provider. Document Revised: 06/10/2018 Document Reviewed: 12/31/2016 Elsevier Patient Education  2021 [employer].

## 2020-05-21 ENCOUNTER — Encounter: Payer: Self-pay | Admitting: Family Medicine

## 2020-05-21 ENCOUNTER — Ambulatory Visit (INDEPENDENT_AMBULATORY_CARE_PROVIDER_SITE_OTHER): Payer: Medicare PPO | Admitting: Family Medicine

## 2020-05-21 ENCOUNTER — Other Ambulatory Visit: Payer: Self-pay

## 2020-05-21 VITALS — BP 128/80 | HR 93 | Resp 19 | Ht 71.0 in | Wt 149.0 lb

## 2020-05-21 DIAGNOSIS — I10 Essential (primary) hypertension: Secondary | ICD-10-CM

## 2020-05-21 DIAGNOSIS — R39198 Other difficulties with micturition: Secondary | ICD-10-CM

## 2020-05-21 DIAGNOSIS — R7303 Prediabetes: Secondary | ICD-10-CM

## 2020-05-21 DIAGNOSIS — E785 Hyperlipidemia, unspecified: Secondary | ICD-10-CM

## 2020-05-21 DIAGNOSIS — Z122 Encounter for screening for malignant neoplasm of respiratory organs: Secondary | ICD-10-CM

## 2020-05-21 DIAGNOSIS — Z0000 Encounter for general adult medical examination without abnormal findings: Secondary | ICD-10-CM

## 2020-05-21 DIAGNOSIS — I252 Old myocardial infarction: Secondary | ICD-10-CM | POA: Diagnosis not present

## 2020-05-21 DIAGNOSIS — Z131 Encounter for screening for diabetes mellitus: Secondary | ICD-10-CM

## 2020-05-21 DIAGNOSIS — K8050 Calculus of bile duct without cholangitis or cholecystitis without obstruction: Secondary | ICD-10-CM

## 2020-05-21 MED ORDER — TAMSULOSIN HCL 0.4 MG PO CAPS
0.4000 mg | ORAL_CAPSULE | Freq: Every day | ORAL | 3 refills | Status: AC
Start: 2020-05-21 — End: ?

## 2020-05-21 MED ORDER — EZETIMIBE 10 MG PO TABS: 10.0000 mg | ORAL_TABLET | Freq: Every day | ORAL | 3 refills | Status: DC

## 2020-05-21 MED ORDER — NITROGLYCERIN 0.4 MG SL SUBL
0.4000 mg | SUBLINGUAL_TABLET | SUBLINGUAL | 1 refills | Status: DC | PRN
Start: 2020-05-21 — End: 2022-05-02

## 2020-05-21 MED ORDER — ATORVASTATIN CALCIUM 80 MG PO TABS
80.0000 mg | ORAL_TABLET | Freq: Every day | ORAL | 3 refills | Status: AC
Start: 2020-05-21 — End: ?

## 2020-05-22 DIAGNOSIS — R7303 Prediabetes: Secondary | ICD-10-CM | POA: Insufficient documentation

## 2020-05-22 LAB — LIPID PANEL
Cholesterol: 127 mg/dL (ref 0–200)
HDL: 34.5 mg/dL — ABNORMAL LOW (ref >39.00)
LDL Cholesterol: 79 mg/dL (ref 0–99)
NonHDL: 92.28
Total CHOL/HDL Ratio: 4
Triglycerides: 68 mg/dL (ref 0.0–149.0)
VLDL: 13.6 mg/dL (ref 0.0–40.0)

## 2020-05-22 LAB — CBC
HCT: 45.9 % (ref 39.0–52.0)
Hemoglobin: 15.6 g/dL (ref 13.0–17.0)
MCHC: 33.9 g/dL (ref 30.0–36.0)
MCV: 102.2 fl — ABNORMAL HIGH (ref 78.0–100.0)
Platelets: 118 10*3/uL — ABNORMAL LOW (ref 150.0–400.0)
RBC: 4.49 Mil/uL (ref 4.22–5.81)
RDW: 15.9 % — ABNORMAL HIGH (ref 11.5–15.5)
WBC: 6.5 10*3/uL (ref 4.0–10.5)

## 2020-05-22 LAB — COMPREHENSIVE METABOLIC PANEL
ALT: 16 U/L (ref 0–53)
AST: 23 U/L (ref 0–37)
Albumin: 4.1 g/dL (ref 3.5–5.2)
Alkaline Phosphatase: 83 U/L (ref 39–117)
BUN: 17 mg/dL (ref 6–23)
CO2: 32 mEq/L (ref 19–32)
Calcium: 9.6 mg/dL (ref 8.4–10.5)
Chloride: 101 mEq/L (ref 96–112)
Creatinine, Ser: 1.21 mg/dL (ref 0.40–1.50)
GFR: 60.45 mL/min (ref >60.00)
Glucose, Bld: 130 mg/dL — ABNORMAL HIGH (ref 70–99)
Potassium: 4.3 mEq/L (ref 3.5–5.1)
Sodium: 139 mEq/L (ref 135–145)
Total Bilirubin: 0.9 mg/dL (ref 0.2–1.2)
Total Protein: 7.1 g/dL (ref 6.0–8.3)

## 2020-05-22 LAB — HEMOGLOBIN A1C: Hgb A1c MFr Bld: 5.8 % (ref 4.6–6.5)

## 2020-05-22 LAB — PSA: PSA: 0.77 ng/mL (ref 0.10–4.00)

## 2020-05-24 ENCOUNTER — Inpatient Hospital Stay (HOSPITAL_BASED_OUTPATIENT_CLINIC_OR_DEPARTMENT_OTHER)
Admission: RE | Admit: 2020-05-24 | Discharge: 2020-05-24 | Disposition: A | Discharge status: Home / Self Care | Payer: Medicare PPO | Source: Ambulatory Visit | Attending: Family Medicine | Admitting: Family Medicine

## 2020-05-24 ENCOUNTER — Other Ambulatory Visit: Payer: Self-pay

## 2020-05-24 DIAGNOSIS — Z122 Encounter for screening for malignant neoplasm of respiratory organs: Secondary | ICD-10-CM | POA: Insufficient documentation

## 2020-05-24 DIAGNOSIS — F17210 Nicotine dependence, cigarettes, uncomplicated: Secondary | ICD-10-CM | POA: Diagnosis not present

## 2020-05-25 ENCOUNTER — Encounter: Payer: Self-pay | Admitting: Family Medicine

## 2020-05-26 ENCOUNTER — Emergency Department (HOSPITAL_COMMUNITY): Payer: Medicare PPO

## 2020-05-26 ENCOUNTER — Encounter (HOSPITAL_COMMUNITY): Payer: Self-pay | Admitting: Cardiology

## 2020-05-26 ENCOUNTER — Other Ambulatory Visit: Payer: Self-pay

## 2020-05-26 ENCOUNTER — Inpatient Hospital Stay (HOSPITAL_COMMUNITY)
Admission: EM | Admit: 2020-05-26 | Discharge: 2020-05-28 | DRG: 286 | Disposition: A | Discharge status: Home / Self Care | Payer: Medicare PPO | Attending: Cardiology | Admitting: Cardiology

## 2020-05-26 DIAGNOSIS — Z955 Presence of coronary angioplasty implant and graft: Secondary | ICD-10-CM | POA: Diagnosis not present

## 2020-05-26 DIAGNOSIS — I252 Old myocardial infarction: Secondary | ICD-10-CM | POA: Diagnosis not present

## 2020-05-26 DIAGNOSIS — Z7982 Long term (current) use of aspirin: Secondary | ICD-10-CM | POA: Diagnosis not present

## 2020-05-26 DIAGNOSIS — Z833 Family history of diabetes mellitus: Secondary | ICD-10-CM

## 2020-05-26 DIAGNOSIS — Z720 Tobacco use: Secondary | ICD-10-CM | POA: Diagnosis not present

## 2020-05-26 DIAGNOSIS — Z7902 Long term (current) use of antithrombotics/antiplatelets: Secondary | ICD-10-CM

## 2020-05-26 DIAGNOSIS — R079 Chest pain, unspecified: Secondary | ICD-10-CM | POA: Diagnosis not present

## 2020-05-26 DIAGNOSIS — I25110 Atherosclerotic heart disease of native coronary artery with unstable angina pectoris: Principal | ICD-10-CM | POA: Diagnosis present

## 2020-05-26 DIAGNOSIS — J449 Chronic obstructive pulmonary disease, unspecified: Secondary | ICD-10-CM | POA: Diagnosis present

## 2020-05-26 DIAGNOSIS — I2510 Atherosclerotic heart disease of native coronary artery without angina pectoris: Secondary | ICD-10-CM | POA: Diagnosis not present

## 2020-05-26 DIAGNOSIS — F1020 Alcohol dependence, uncomplicated: Secondary | ICD-10-CM | POA: Diagnosis present

## 2020-05-26 DIAGNOSIS — Z8249 Family history of ischemic heart disease and other diseases of the circulatory system: Secondary | ICD-10-CM

## 2020-05-26 DIAGNOSIS — F17210 Nicotine dependence, cigarettes, uncomplicated: Secondary | ICD-10-CM | POA: Diagnosis present

## 2020-05-26 DIAGNOSIS — I5042 Chronic combined systolic (congestive) and diastolic (congestive) heart failure: Secondary | ICD-10-CM | POA: Diagnosis not present

## 2020-05-26 DIAGNOSIS — I200 Unstable angina: Principal | ICD-10-CM | POA: Diagnosis present

## 2020-05-26 DIAGNOSIS — I2584 Coronary atherosclerosis due to calcified coronary lesion: Secondary | ICD-10-CM | POA: Diagnosis present

## 2020-05-26 DIAGNOSIS — F32A Depression, unspecified: Secondary | ICD-10-CM | POA: Diagnosis present

## 2020-05-26 DIAGNOSIS — E785 Hyperlipidemia, unspecified: Secondary | ICD-10-CM | POA: Diagnosis present

## 2020-05-26 DIAGNOSIS — I5043 Acute on chronic combined systolic (congestive) and diastolic (congestive) heart failure: Secondary | ICD-10-CM | POA: Diagnosis not present

## 2020-05-26 DIAGNOSIS — I25119 Atherosclerotic heart disease of native coronary artery with unspecified angina pectoris: Secondary | ICD-10-CM | POA: Diagnosis not present

## 2020-05-26 DIAGNOSIS — Z79899 Other long term (current) drug therapy: Secondary | ICD-10-CM | POA: Diagnosis not present

## 2020-05-26 DIAGNOSIS — R072 Precordial pain: Secondary | ICD-10-CM

## 2020-05-26 DIAGNOSIS — I110 Hypertensive heart disease with heart failure: Secondary | ICD-10-CM | POA: Diagnosis present

## 2020-05-26 DIAGNOSIS — I255 Ischemic cardiomyopathy: Secondary | ICD-10-CM | POA: Diagnosis present

## 2020-05-26 DIAGNOSIS — Z818 Family history of other mental and behavioral disorders: Secondary | ICD-10-CM

## 2020-05-26 DIAGNOSIS — D696 Thrombocytopenia, unspecified: Secondary | ICD-10-CM | POA: Diagnosis not present

## 2020-05-26 DIAGNOSIS — I10 Essential (primary) hypertension: Secondary | ICD-10-CM

## 2020-05-26 DIAGNOSIS — Z8673 Personal history of transient ischemic attack (TIA), and cerebral infarction without residual deficits: Secondary | ICD-10-CM | POA: Diagnosis not present

## 2020-05-26 DIAGNOSIS — I2699 Other pulmonary embolism without acute cor pulmonale: Secondary | ICD-10-CM | POA: Diagnosis not present

## 2020-05-26 DIAGNOSIS — I517 Cardiomegaly: Secondary | ICD-10-CM | POA: Diagnosis not present

## 2020-05-26 DIAGNOSIS — Z20822 Contact with and (suspected) exposure to covid-19: Secondary | ICD-10-CM | POA: Diagnosis not present

## 2020-05-26 DIAGNOSIS — E782 Mixed hyperlipidemia: Secondary | ICD-10-CM | POA: Diagnosis not present

## 2020-05-26 DIAGNOSIS — Q245 Malformation of coronary vessels: Secondary | ICD-10-CM

## 2020-05-26 DIAGNOSIS — J90 Pleural effusion, not elsewhere classified: Secondary | ICD-10-CM | POA: Diagnosis not present

## 2020-05-26 LAB — COMPREHENSIVE METABOLIC PANEL
ALT: 18 U/L (ref 0–44)
AST: 27 U/L (ref 15–41)
Albumin: 3.5 g/dL (ref 3.5–5.0)
Alkaline Phosphatase: 75 U/L (ref 38–126)
Anion gap: 9 (ref 5–15)
BUN: 19 mg/dL (ref 8–23)
CO2: 25 mmol/L (ref 22–32)
Calcium: 9 mg/dL (ref 8.9–10.3)
Chloride: 103 mmol/L (ref 98–111)
Creatinine, Ser: 1.12 mg/dL (ref 0.61–1.24)
GFR, Estimated: >60 mL/min (ref >60)
Glucose, Bld: 105 mg/dL — ABNORMAL HIGH (ref 70–99)
Potassium: 3.8 mmol/L (ref 3.5–5.1)
Sodium: 137 mmol/L (ref 135–145)
Total Bilirubin: 0.7 mg/dL (ref 0.3–1.2)
Total Protein: 6.6 g/dL (ref 6.5–8.1)

## 2020-05-26 LAB — CBC
HCT: 42 % (ref 39.0–52.0)
Hemoglobin: 13.9 g/dL (ref 13.0–17.0)
MCH: 34.1 pg — ABNORMAL HIGH (ref 26.0–34.0)
MCHC: 33.1 g/dL (ref 30.0–36.0)
MCV: 102.9 fL — ABNORMAL HIGH (ref 80.0–100.0)
Platelets: 93 10*3/uL — ABNORMAL LOW (ref 150–400)
RBC: 4.08 MIL/uL — ABNORMAL LOW (ref 4.22–5.81)
RDW: 14.6 % (ref 11.5–15.5)
WBC: 6.1 10*3/uL (ref 4.0–10.5)
nRBC: 0 % (ref 0.0–0.2)

## 2020-05-26 LAB — RESP PANEL BY RT-PCR (FLU A&B, COVID) ARPGX2
Influenza A by PCR: NEGATIVE
Influenza B by PCR: NEGATIVE
SARS Coronavirus 2 by RT PCR: NEGATIVE

## 2020-05-26 LAB — HEPARIN LEVEL (UNFRACTIONATED): Heparin Unfractionated: <0.1 IU/mL — ABNORMAL LOW (ref 0.30–0.70)

## 2020-05-26 LAB — TROPONIN I (HIGH SENSITIVITY)
Troponin I (High Sensitivity): 23 ng/L — ABNORMAL HIGH (ref <18)
Troponin I (High Sensitivity): 23 ng/L — ABNORMAL HIGH (ref <18)

## 2020-05-26 MED ORDER — HEPARIN BOLUS VIA INFUSION
4000.0000 [IU] | Freq: Once | INTRAVENOUS | Status: AC
  Administered 2020-05-26: 4000 [IU] via INTRAVENOUS
  Filled 2020-05-26: qty 4000

## 2020-05-26 MED ORDER — NITROGLYCERIN 0.4 MG SL SUBL
SUBLINGUAL_TABLET | SUBLINGUAL | Status: AC
  Administered 2020-05-26: 0.4 mg via SUBLINGUAL
  Filled 2020-05-26: qty 1

## 2020-05-26 MED ORDER — ASPIRIN 300 MG RE SUPP
300.0000 mg | RECTAL | Status: AC
  Filled 2020-05-26: qty 1

## 2020-05-26 MED ORDER — ASPIRIN 81 MG PO CHEW
324.0000 mg | CHEWABLE_TABLET | Freq: Once | ORAL | Status: AC
  Administered 2020-05-26: 324 mg via ORAL
  Filled 2020-05-26: qty 4

## 2020-05-26 MED ORDER — ONDANSETRON HCL 4 MG/2ML IJ SOLN: 4.0000 mg | Freq: Four times a day (QID) | INTRAMUSCULAR | Status: DC | PRN

## 2020-05-26 MED ORDER — ALBUTEROL SULFATE HFA 108 (90 BASE) MCG/ACT IN AERS
2.0000 | INHALATION_SPRAY | Freq: Four times a day (QID) | RESPIRATORY_TRACT | Status: DC | PRN
  Filled 2020-05-26: qty 6.7

## 2020-05-26 MED ORDER — HEPARIN (PORCINE) 25000 UT/250ML-% IV SOLN
1450.0000 [IU]/h | INTRAVENOUS | Status: DC
  Administered 2020-05-26: 850 [IU]/h via INTRAVENOUS
  Administered 2020-05-27: 1250 [IU]/h via INTRAVENOUS
  Administered 2020-05-28: 1350 [IU]/h via INTRAVENOUS
  Filled 2020-05-26 (×3): qty 250

## 2020-05-26 MED ORDER — NITROGLYCERIN 0.4 MG SL SUBL
0.4000 mg | SUBLINGUAL_TABLET | SUBLINGUAL | Status: DC | PRN
  Administered 2020-05-26: 0.4 mg via SUBLINGUAL

## 2020-05-26 MED ORDER — LOSARTAN POTASSIUM 25 MG PO TABS
25.0000 mg | ORAL_TABLET | Freq: Every day | ORAL | Status: DC
  Administered 2020-05-26 – 2020-05-27 (×2): 25 mg via ORAL
  Filled 2020-05-26 (×3): qty 1

## 2020-05-26 MED ORDER — CARVEDILOL 6.25 MG PO TABS
6.2500 mg | ORAL_TABLET | Freq: Two times a day (BID) | ORAL | Status: DC
  Administered 2020-05-26 – 2020-05-27 (×4): 6.25 mg via ORAL
  Filled 2020-05-26 (×4): qty 1

## 2020-05-26 MED ORDER — EZETIMIBE 10 MG PO TABS
10.0000 mg | ORAL_TABLET | Freq: Every day | ORAL | Status: DC
  Administered 2020-05-26 – 2020-05-28 (×3): 10 mg via ORAL
  Filled 2020-05-26 (×3): qty 1

## 2020-05-26 MED ORDER — ASPIRIN EC 81 MG PO TBEC
81.0000 mg | DELAYED_RELEASE_TABLET | Freq: Every day | ORAL | Status: DC
  Administered 2020-05-27: 81 mg via ORAL
  Filled 2020-05-26: qty 1

## 2020-05-26 MED ORDER — ASPIRIN 81 MG PO CHEW: 324.0000 mg | CHEWABLE_TABLET | ORAL | Status: AC

## 2020-05-26 MED ORDER — HEPARIN BOLUS VIA INFUSION
2000.0000 [IU] | Freq: Once | INTRAVENOUS | Status: AC
  Administered 2020-05-26: 2000 [IU] via INTRAVENOUS
  Filled 2020-05-26: qty 2000

## 2020-05-26 MED ORDER — ATORVASTATIN CALCIUM 80 MG PO TABS
80.0000 mg | ORAL_TABLET | Freq: Every day | ORAL | Status: DC
  Administered 2020-05-26 – 2020-05-28 (×3): 80 mg via ORAL
  Filled 2020-05-26 (×4): qty 1

## 2020-05-26 MED ORDER — MORPHINE SULFATE (PF) 4 MG/ML IV SOLN
4.0000 mg | Freq: Once | INTRAVENOUS | Status: AC
  Administered 2020-05-26: 4 mg via INTRAVENOUS
  Filled 2020-05-26: qty 1

## 2020-05-26 MED ORDER — ACETAMINOPHEN 325 MG PO TABS
650.0000 mg | ORAL_TABLET | ORAL | Status: DC | PRN
  Administered 2020-05-27 (×2): 650 mg via ORAL
  Filled 2020-05-26 (×2): qty 2

## 2020-05-26 NOTE — Plan of Care (Signed)
  Problem: Education: Goal: Knowledge of General Education information will improve Description: Including pain rating scale, medication(s)/side effects and non-pharmacologic comfort measures Outcome: Progressing   Problem: Clinical Measurements: Goal: Ability to maintain clinical measurements within normal limits will improve Outcome: Progressing   

## 2020-05-26 NOTE — Progress Notes (Signed)
ANTICOAGULATION CONSULT NOTE  Pharmacy Consult for Heparin Indication: chest pain/ACS  No Known Allergies  Patient Measurements: Height: _0  (180.3 cm) Weight: 68 kg (150 lb) IBW/kg (Calculated) : 75.3 Heparin Dosing Weight: 68 kg  Vital Signs: Temp: 98.1 F (36.7 C) (03/26 1059) Temp Source: Oral (03/26 1059) BP: 138/77 (03/26 1059) Pulse Rate: 82 (03/26 1059)  Labs: Recent Labs    05/26/20 0715 05/26/20 0729 05/26/20 0917  HGB 13.9  --   --   HCT 42.0  --   --   PLT 93*  --   --   CREATININE  --  1.12  --   TROPONINIHS 23*  --  23*    Estimated Creatinine Clearance: 58.2 mL/min (by C-G formula based on SCr of 1.12 mg/dL).   Medical History: Past Medical History:  Diagnosis Date  . Abdominal pain 05/02/2019  . Alcoholism ([place])   . CAD (coronary artery disease)    a. 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 Chicken pox   . Depression   . ETOH abuse   . Genital warts   . Hepatic steatosis   . Hepatitis C    a. s/p [name] ~ 2003 in [place], [place] => stopped after 6 mos due to neutropenia - viral load noted to be low at that time;  no follow up since;   b. abdominal u/s 11/13:  diffuse hepatic steatosis and/or hepatocellular disease    . Hx of echocardiogram    a. Echo 01/06/12: Mild focal basal hypertrophy the septum, vigorous LV, EF 65-70%, Gr 2 diast dysfn, trivial AI, MAC, trivial MR, trivial TR, PASP 36  . Hyperlipidemia   . Hypertension   . Non-STEMI (non-ST elevated myocardial infarction) ([place])   . Stroke [facility]    "minor" pt reported  . Thrombocytopenia ([place])   . Tobacco abuse     Medications:  Scheduled:  . heparin  4,000 Units Intravenous  Once    Assessment: Patient is a 81 yom that is being admitted for unstable angina. Patient has a past hx of PCI, HTN, and prior CVA. Pharmacy has been asked to dose heparin at this time for CP/ACS. Patient noted to have slightly low platelets at this time @ 93. Platelets on labs from 3/21 were around 118. Plan to monitor closely.  Goal of Therapy:  Heparin level 0.3-0.7 units/ml Monitor platelets by anticoagulation protocol: Yes   Plan:  - Heparin bolus 4000 unitsw IV x 1 dose - Heparin drip @ 850 units/hr - Heparin level in ~ 6hours  - Monitor patient for s/s of bleeding and CBC while on heparin   Duanne Limerick PharmD. BCPS  05/26/2020,11:43 AM

## 2020-05-26 NOTE — Progress Notes (Signed)
C/O CP 6/10 on arrival to room from ED. EKG done. NTG 0.4 mg sl given x 2. CP no 2/10. Pt says it is resolving. O2 sats dropping to 85 % on RA when dosing off to sleep. Placed on 2 liters [place]. Will cont to monitor.

## 2020-05-26 NOTE — ED Notes (Signed)
Attempted report 

## 2020-05-26 NOTE — H&P (Signed)
Cardiology Admission History and Physical:   Patient ID: Hunter Mcmillan MRN: [id number]; DOB: [birth year]   Admission date: 05/26/2020  PCP:  [name], MD   [place]  Cardiologist:  Peter Martinique, MD  [id number] Chief Complaint:  Chest pain  Patient Profile:   Hunter Mcmillan is a 70 y.o. male with past medical history of coronary artery disease with previous PCI of the right coronary artery, hypertension, hyperlipidemia, prior CVA, alcohol abuse, tobacco abuse, COPD with chest pain.  History of Present Illness:   Patient presented with inferior myocardial infarction February 2021.  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  Patient had drug-eluting stent x2 and thrombectomy of the right coronary artery.  He had chest pain immediately following the procedure and relook catheterization revealed continued patency of the right coronary artery stents.  Echocardiogram during that admission showed ejection fraction 40 to 45%, mild left ventricular hypertrophy, grade 1 diastolic dysfunction, severe RV dysfunction, mild right ventricular enlargement, mild to moderate tricuspid regurgitation.  Had symptoms consistent with cholelithiasis.  He had ERCP with sphincterotomy (cholangiogram did not reveal stones).  Common bile duct was swept with good bile flow and liver enzymes improved.  Patient states that he typically does not have exertional chest pain but he does have dyspnea on exertion.  He denies orthopnea, PND, pedal edema or syncope.  This morning at 5 AM he developed an aching pain in his substernal area without radiation.  No associated symptoms.  Pain is reminiscent of his myocardial infarction pain.  There was a question increased with inspiration.  He took 3 sublingual nitroglycerin without improvement and  therefore presented to the emergency room.  His pain has now resolved.  Cardiology is asked to evaluate.   Past Medical History:  Diagnosis Date  . Abdominal pain 05/02/2019  . Alcoholism ([place])   . CAD (coronary artery disease)    a. 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 Chicken pox   . Depression   . ETOH abuse   . Genital warts   . Hepatic steatosis   . Hepatitis C    a. s/p [name] ~ 2003 in [place], [place] => stopped after 6 mos due to neutropenia - viral load noted to be low at that time;  no follow up since;   b. abdominal u/s 11/13:  diffuse hepatic steatosis and/or hepatocellular disease    . Hx of echocardiogram    a. Echo 01/06/12: Mild focal basal hypertrophy the septum, vigorous LV, EF 65-70%, Gr 2 diast dysfn, trivial AI, MAC, trivial MR, trivial TR, PASP 36  . Hyperlipidemia   . Hypertension   . Non-STEMI (non-ST elevated myocardial infarction) ([place])   . Stroke [facility])    "minor" pt reported  . Thrombocytopenia ([place])   . Tobacco abuse     Past Surgical History:  Procedure Laterality Date  . CORONARY ANGIOGRAPHY N/A 04/24/2019   Procedure: CORONARY ANGIOGRAPHY;  Surgeon: Martinique, Peter M, MD;  Location: [place] [facility];  Service: Cardiovascular;  Laterality: N/A;  .  CORONARY/GRAFT ACUTE MI REVASCULARIZATION N/A 04/24/2019   Procedure: Coronary/Graft Acute MI Revascularization;  Surgeon: Martinique, Peter M, MD;  Location: [place] [facility];  Service: Cardiovascular;  Laterality: N/A;  . ERCP N/A 05/13/2019   Procedure: ENDOSCOPIC RETROGRADE CHOLANGIOPANCREATOGRAPHY (ERCP);  Surgeon: Ronnette Juniper, MD;  Location: [place];  Service: Gastroenterology;  Laterality: N/A;   . EYE SURGERY  1954   corrective surgery for crossed eyes  . EYE SURGERY  1959  . LEFT HEART CATH AND CORONARY ANGIOGRAPHY N/A 04/24/2019   Procedure: LEFT HEART CATH AND CORONARY ANGIOGRAPHY;  Surgeon: Martinique, Peter M, MD;  Location: [place] [facility];  Service: Cardiovascular;  Laterality: N/A;  . LEFT HEART CATHETERIZATION WITH CORONARY ANGIOGRAM N/A 01/05/2012   Procedure: LEFT HEART CATHETERIZATION WITH CORONARY ANGIOGRAM;  Surgeon: Larey Dresser, MD;  Location: [facility] CATH LAB;  Service: Cardiovascular;  Laterality: N/A;  . REMOVAL OF STONES  05/13/2019   Procedure: REMOVAL OF STONES;  Surgeon: Ronnette Juniper, MD;  Location: [place];  Service: Gastroenterology;;  . [name]  05/13/2019   Procedure: [name];  Surgeon: Ronnette Juniper, MD;  Location: [facility] ENDOSCOPY;  Service: Gastroenterology;;     Medications Prior to Admission: Prior to Admission medications   Medication Sig Start Date End Date Taking? Authorizing Provider  acetaminophen (TYLENOL) 500 MG tablet Take 1 tablet (500 mg total) by mouth every 6 (six) hours as needed for mild pain, fever or headache. 05/13/19   Arrien, Jimmy Picket, MD  albuterol (VENTOLIN HFA) 108 (90 Base) MCG/ACT inhaler Inhale 2 puffs into the lungs every 6 (six) hours as needed for wheezing or shortness of breath. 05/19/19   Copland, Gay Filler, MD  aspirin EC 81 MG tablet Take 81 mg by mouth daily.    [provider]  atorvastatin (LIPITOR) 80 MG tablet Take 1 tablet (80 mg total) by mouth daily at 6 PM. 05/21/20   Copland, Gay Filler, MD  carvedilol (COREG) 6.25 MG tablet Take 1 tablet (6.25 mg total) by mouth 2 (two) times daily. Patient not taking: Reported on 05/21/2020 10/24/19   Martinique, Peter M, MD  diphenhydrAMINE (BENADRYL) 25 mg capsule Take 25 mg by mouth as needed for allergies. Patient not taking: Reported on 05/21/2020    [provider]  ezetimibe (ZETIA) 10 MG tablet Take 1 tablet (10 mg total) by mouth daily. 05/21/20  08/19/20  Copland, Gay Filler, MD  fluticasone (FLOVENT HFA) 110 MCG/ACT inhaler Inhale 2 puffs into the lungs 2 (two) times daily. 04/26/19 04/25/20  Leanor Kail, PA  Multiple Vitamin (MULTIVITAMIN WITH MINERALS) TABS tablet Take 1 tablet by mouth daily.    [provider]  nitroGLYCERIN (NITROSTAT) 0.4 MG SL tablet Place 1 tablet (0.4 mg total) under the tongue every 5 (five) minutes as needed for chest pain. 05/21/20   Copland, Gay Filler, MD  tamsulosin (FLOMAX) 0.4 MG CAPS capsule Take 1 capsule (0.4 mg total) by mouth daily. 05/21/20   Copland, Gay Filler, MD  ticagrelor (BRILINTA) 90 MG TABS tablet Take 1 tablet (90 mg total) by mouth 2 (two) times daily. 04/26/19   Leanor Kail, PA     Allergies:   No Known Allergies  Social History:   Social History   Socioeconomic History  . Marital status: Single    Spouse name: Not on file  . Number of children: 1  . Years of education: Not on file  . Highest education level: Bachelor's degree (e.g., BA, AB, BS)  Occupational History  . Occupation: Dealer  Employer: [employer]    Comment: retired  Tobacco Use  . Smoking status: Current Every Day Smoker    Packs/day: 0.10    Types: Cigarettes  . Smokeless tobacco: Never Used  . Tobacco comment: almost quit, very rare, 1-2 daily  Vaping Use  . Vaping Use: Never used  Substance and Sexual Activity  . Alcohol use: Yes    Alcohol/week: 0.0 standard drinks  . Drug use: Not Currently    Types: Cocaine    Comment: history of cocaine last use 2014  . Sexual activity: Yes    Partners: Female    Comment: pt. declined condoms  Other Topics Concern  . Not on file  Social History Narrative  . Not on file   Social Determinants of Health   Financial Resource Strain: Not on file  Food Insecurity: Not on file  Transportation Needs: Not on file  Physical Activity: Not on file  Stress: Not on file  Social Connections: Not on file  Intimate Partner Violence: Not  on file    Family History:   The patient's family history includes Depression in his brother; Diabetes in his brother, maternal grandfather, and mother; Early death in his maternal grandfather and maternal grandmother; Hearing loss in his sister and sister; Heart attack in his maternal grandfather and maternal grandmother; Heart attack (age of onset: 106) in his mother; Heart disease in his maternal grandfather, maternal grandmother, and mother; Hyperlipidemia in his brother, father, and sister; Hypertension in his father.    ROS:  Please see the history of present illness.  No fevers, chills, hemoptysis, melena or hematochezia.  Chronic productive cough.  All other ROS reviewed and negative.     Physical Exam/Data:   Vitals:   05/26/20 0845 05/26/20 0900 05/26/20 0915 05/26/20 1000  BP: 132/85 138/90 137/83 136/86  Pulse: 78 79 77 77  Resp: _0 (!) 24  Temp:      SpO2: 94% 94% 95% 96%  Weight:      Height:        Intake/Output Summary (Last 24 hours) at 05/26/2020 1016 Last data filed at 05/26/2020 4709 Gross per 24 hour  Intake 0 ml  Output 0 ml  Net 0 ml   Last 3 Weights 05/26/2020 05/21/2020 03/28/2020  Weight (lbs) 150 lb 149 lb 150 lb  Weight (kg) 68.04 kg 67.586 kg 68.04 kg     Body mass index is 20.92 kg/m.  General:  Well nourished, well developed, in no acute distress HEENT: normal Lymph: no adenopathy Neck: no JVD Endocrine:  No thryomegaly Vascular: No carotid bruits; FA pulses 2+ bilaterally without bruits  Cardiac:  normal S1, S2; RRR; no murmur  Lungs: Diminished breath sounds throughout Abd: soft, nontender, no hepatomegaly  Ext: no edema; chronic skin changes Musculoskeletal:  No deformities, BUE and BLE strength normal and equal Skin: warm and dry  Neuro:  CNs 2-12 intact, no focal abnormalities noted Psych:  Normal affect    EKG:  The ECG that was done 05/26/20 was personally reviewed and demonstrates normal sinus rhythm, cannot rule out septal or  inferior infarct, no diagnostic ST changes.  Laboratory Data:  High Sensitivity Troponin:   Recent Labs  Lab 05/26/20 0715 05/26/20 0917  TROPONINIHS 23* 23*      Chemistry Recent Labs  Lab 05/21/20 1352 05/26/20 0729  NA 139 137  K 4.3 3.8  CL 101 103  CO2 32 25  GLUCOSE 130* 105*  BUN 17 19  CREATININE  1.21 1.12  CALCIUM 9.6 9.0  GFRNONAA  --  >60  ANIONGAP  --  9    Recent Labs  Lab 05/21/20 1352 05/26/20 0729  PROT 7.1 6.6  ALBUMIN 4.1 3.5  AST 23 27  ALT 16 18  ALKPHOS 83 75  BILITOT 0.9 0.7   Hematology Recent Labs  Lab 05/21/20 1352 05/26/20 0715  WBC 6.5 6.1  RBC 4.49 4.08*  HGB 15.6 13.9  HCT 45.9 42.0  MCV 102.2* 102.9*  MCH  --  34.1*  MCHC 33.9 33.1  RDW 15.9* 14.6  PLT 118.0* 93*    Radiology/Studies:  DG Chest 2 View  Result Date: 05/26/2020 CLINICAL DATA:  Chest pain EXAM: CHEST - 2 VIEW COMPARISON:  03/28/2020 FINDINGS: Lungs are clear.  No pleural effusion or pneumothorax. The heart is normal in size.  Thoracic aortic atherosclerosis. Degenerative changes of the visualized thoracolumbar spine. IMPRESSION: Normal chest radiographs. Electronically Signed   By: Julian Hy M.D.   On: 05/26/2020 07:54     Assessment and Plan:   1. Unstable angina-patient presents with symptoms that are concerning and he describes them as similar to his previous infarct pain.  He is presently pain-free.  Will admit and cycle enzymes.  Note electrocardiogram without diagnostic ST changes.  Treat with heparin, aspirin, home dose of Lipitor and carvedilol.  Plan cardiac catheterization on Monday.  The risk and benefits including myocardial infarction, CVA and death discussed and he agrees to proceed. 2. Hyperlipidemia-continue statin.  Check lipids and liver. 3. Hypertension-blood pressure is controlled at time of admission.  Continue preadmission medications including carvedilol. 4. Tobacco abuse-patient counseled on discontinuing. 5. Ischemic  cardiomyopathy-most recent echocardiogram showed ejection fraction 40 to 45%.  Add losartan 25 mg daily and advance as tolerated by blood pressure.  Repeat echocardiogram. 6. History of cholecystitis status post ERCP and sphincterotomy-no right upper quadrant tenderness on palpation and symptoms do not appear to be consistent with recurrent cholecystitis.   Risk Assessment/Risk Scores:        :[id number]}   TIMI Risk Score for Unstable Angina or Non-ST Elevation MI:   The patient's TIMI risk score is 5, which indicates a 26% risk of all cause mortality, new or recurrent myocardial infarction or need for urgent revascularization in the next 14 days.{  Severity of Illness: The appropriate patient status for this patient is INPATIENT. Inpatient status is judged to be reasonable and necessary in order to provide the required intensity of service to ensure the patient's safety. The patient's presenting symptoms, physical exam findings, and initial radiographic and laboratory data in the context of their chronic comorbidities is felt to place them at high risk for further clinical deterioration. Furthermore, it is not anticipated that the patient will be medically stable for discharge from the hospital within 2 midnights of admission. The following factors support the patient status of inpatient.   " The patient's presenting symptoms include Chest pain. " The worrisome physical exam findings include diminished BS. " The initial radiographic and laboratory data are worrisome because of mildly elevated troponin. " The chronic co-morbidities include known CAD and COPD.   * I certify that at the point of admission it is my clinical judgment that the patient will require inpatient hospital care spanning beyond 2 midnights from the point of admission due to high intensity of service, high risk for further deterioration and high frequency of surveillance required.*    For questions or updates, please contact  [place] Please consult  [email or web address] for contact info under     Signed, Kirk Ruths, MD  05/26/2020 10:16 AM

## 2020-05-26 NOTE — ED Provider Notes (Signed)
[facility] EMERGENCY DEPARTMENT Provider Note   CSN: [id number] Arrival date & time: 05/26/20  2458     History Chief Complaint  Patient presents with  . Chest Pain    Hunter Mcmillan is a 70 y.o. male with a hx of CAD s/p DES x2 to RCA, HTN, HLD, h/o CVA, EtOH abuse, tobacco abuse and COPD that presents the emerge department today for severe chest pain.  Patient states that his chest pain woke him up out of his sleep about an hour ago, is in the center of his chest and describes it as a dull aching pain.  Denies any radiation.  Denies any shortness of breath.  Denies any diaphoresis, nausea or vomiting.  Patient states that he took 3 nitro this morning, states that now the chest pain is a 7 out of 10 instead of a 10 out of 10.   patient states that he did have a headache after he took nitro.  Patient states that he has not had chest pain like this since his prior heart attack in 2021.  States that it feels similar to his prior heart attack, exactly the same.  Denies any new cough or URI symptoms.  Denies any back pain, abdominal pain.  Denies any leg swelling, chest pain or palpitations.  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 More recently, patient was admitted for STEMI in February 2021. Cardiac catheterization revealed 100% occluded proximal RCA treated with 2 drug-eluting stents, and heavy thrombus in the left circumflex.  Per cardiology note patient was supposed to continue aspirin and Brilinta.  Patient states that he has not taken Brilinta for the past couple of months, states that he ran out of it.  When asked if he was was to stop taking it, he states that he was not spoke to stop taking it he just ran out of it.  Patient also endorses smoking about 5 to 6 cigarettes a day, shortness.  Dr.  Martinique  HPI     Past Medical History:  Diagnosis Date  . Abdominal pain 05/02/2019  . Alcoholism ([place])   . CAD (coronary artery disease)    a. 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 Chicken pox   . Depression   . ETOH abuse   . Genital warts   . Hepatic steatosis   . Hepatitis C    a. s/p [name] ~ 2003 in [place], [place] => stopped after 6 mos due to neutropenia - viral load noted to be low at that time;  no follow up since;   b. abdominal u/s 11/13:  diffuse hepatic steatosis and/or hepatocellular disease    . Hx of echocardiogram    a. Echo 01/06/12: Mild focal basal hypertrophy the septum, vigorous LV, EF 65-70%, Gr 2 diast dysfn, trivial AI, MAC, trivial MR, trivial TR, PASP 36  . Hyperlipidemia   . Hypertension   . Non-STEMI (non-ST elevated myocardial infarction) ([place])   . Stroke [facility])    "minor" pt  reported  . Thrombocytopenia ([place])   . Tobacco abuse     Patient Active Problem List   Diagnosis Date Noted  . Prediabetes 05/22/2020  . Acute cholecystitis 05/09/2019  . Cholelithiasis 04/30/2019  . RUQ abdominal pain   . Presence of drug-eluting stent in right coronary artery 04/25/2019  . Hyperlipidemia with target LDL less than 70 04/25/2019  . Acute inferior STEMI 04/25/2019  . STEMI involving right coronary artery ([place]) 04/24/2019  . Abdominal aortic aneurysm (AAA) 3.0 cm to 5.5 cm in diameter in male ([name]) 01/06/2017  . Essential hypertension 04/09/2015  . Hyperpigmentation of skin 10/05/2014  . COPD (chronic obstructive pulmonary disease) ([place]) 06/26/2014  . Sacroiliac joint dysfunction of right side 06/26/2014  . Alcoholism ([place])   . Cocaine abuse ([place])  11/18/2013  . Scrotal mass 07/21/2013  . Cirrhosis ([place]) 05/28/2013  . Routine general medical examination at a health care facility 07/19/2012  . Abnormal TSH 04/09/2012  . Coronary artery disease involving native heart with unstable angina pectoris ([place]) 01/14/2012  . Hx of non-ST elevation myocardial infarction (NSTEMI) 01/05/2012  . Tobacco abuse 01/05/2012  . Chronic hepatitis C without hepatic coma ([place]) 01/05/2012  . Depression, recurrent ([place]) 01/05/2012  . Thrombocytopenia ([place]) 01/05/2012    Past Surgical History:  Procedure Laterality Date  . CORONARY ANGIOGRAPHY N/A 04/24/2019   Procedure: CORONARY ANGIOGRAPHY;  Surgeon: Martinique, Peter M, MD;  Location: [place] CV LAB;  Service: Cardiovascular;  Laterality: N/A;  . CORONARY/GRAFT ACUTE MI REVASCULARIZATION N/A 04/24/2019   Procedure: Coronary/Graft Acute MI Revascularization;  Surgeon: Martinique, Peter M, MD;  Location: [place] CV LAB;  Service: Cardiovascular;  Laterality: N/A;  . ERCP N/A 05/13/2019   Procedure: ENDOSCOPIC RETROGRADE CHOLANGIOPANCREATOGRAPHY (ERCP);  Surgeon: Ronnette Juniper, MD;  Location: [place];  Service: Gastroenterology;  Laterality: N/A;  . EYE SURGERY  1954   corrective surgery for crossed eyes  . EYE SURGERY  1959  . LEFT HEART CATH AND CORONARY ANGIOGRAPHY N/A 04/24/2019   Procedure: LEFT HEART CATH AND CORONARY ANGIOGRAPHY;  Surgeon: Martinique, Peter M, MD;  Location: [place] CV LAB;  Service: Cardiovascular;  Laterality: N/A;  . LEFT HEART CATHETERIZATION WITH CORONARY ANGIOGRAM N/A 01/05/2012   Procedure: LEFT HEART CATHETERIZATION WITH CORONARY ANGIOGRAM;  Surgeon: Larey Dresser, MD;  Location: [facility] CATH LAB;  Service: Cardiovascular;  Laterality: N/A;  . REMOVAL OF STONES  05/13/2019   Procedure: REMOVAL OF STONES;  Surgeon: Ronnette Juniper, MD;  Location: [place];  Service: Gastroenterology;;  . [name]  05/13/2019   Procedure: [name];  Surgeon: Ronnette Juniper, MD;  Location:  [facility] ENDOSCOPY;  Service: Gastroenterology;;       Family History  Problem Relation Age of Onset  . Heart attack Mother 36  . Diabetes Mother   . Heart disease Mother   . Hyperlipidemia Father   . Hypertension Father   . Diabetes Maternal Grandfather   . Heart disease Maternal Grandfather   . Early death Maternal Grandfather   . Heart attack Maternal Grandfather   . Hearing loss Sister   . Hyperlipidemia Sister   . Depression Brother   . Diabetes Brother   . Hyperlipidemia Brother   . Early death Maternal Grandmother   . Heart disease Maternal Grandmother   . Heart attack Maternal Grandmother   . Hearing loss Sister     Social History   Tobacco Use  . Smoking status: Current Every Day Smoker    Packs/day: 0.10    Types: Cigarettes  .  Smokeless tobacco: Never Used  . Tobacco comment: almost quit, very rare, 1-2 daily  Vaping Use  . Vaping Use: Never used  Substance Use Topics  . Alcohol use: Yes    Alcohol/week: 0.0 standard drinks  . Drug use: Not Currently    Types: Cocaine    Comment: history of cocaine last use 2014    Home Medications Prior to Admission medications   Medication Sig Start Date End Date Taking? Authorizing Provider  acetaminophen (TYLENOL) 500 MG tablet Take 1 tablet (500 mg total) by mouth every 6 (six) hours as needed for mild pain, fever or headache. 05/13/19   Arrien, Jimmy Picket, MD  albuterol (VENTOLIN HFA) 108 (90 Base) MCG/ACT inhaler Inhale 2 puffs into the lungs every 6 (six) hours as needed for wheezing or shortness of breath. 05/19/19   Copland, Gay Filler, MD  aspirin EC 81 MG tablet Take 81 mg by mouth daily.    [provider]  atorvastatin (LIPITOR) 80 MG tablet Take 1 tablet (80 mg total) by mouth daily at 6 PM. 05/21/20   Copland, Gay Filler, MD  carvedilol (COREG) 6.25 MG tablet Take 1 tablet (6.25 mg total) by mouth 2 (two) times daily. Patient not taking: Reported on 05/21/2020 10/24/19   Martinique, Peter M, MD   diphenhydrAMINE (BENADRYL) 25 mg capsule Take 25 mg by mouth as needed for allergies. Patient not taking: Reported on 05/21/2020    [provider]  ezetimibe (ZETIA) 10 MG tablet Take 1 tablet (10 mg total) by mouth daily. 05/21/20 08/19/20  Copland, Gay Filler, MD  fluticasone (FLOVENT HFA) 110 MCG/ACT inhaler Inhale 2 puffs into the lungs 2 (two) times daily. 04/26/19 04/25/20  Leanor Kail, PA  Multiple Vitamin (MULTIVITAMIN WITH MINERALS) TABS tablet Take 1 tablet by mouth daily.    [provider]  nitroGLYCERIN (NITROSTAT) 0.4 MG SL tablet Place 1 tablet (0.4 mg total) under the tongue every 5 (five) minutes as needed for chest pain. 05/21/20   Copland, Gay Filler, MD  tamsulosin (FLOMAX) 0.4 MG CAPS capsule Take 1 capsule (0.4 mg total) by mouth daily. 05/21/20   Copland, Gay Filler, MD  ticagrelor (BRILINTA) 90 MG TABS tablet Take 1 tablet (90 mg total) by mouth 2 (two) times daily. 04/26/19   Leanor Kail, PA    Allergies    Patient has no known allergies.  Review of Systems   Review of Systems  Constitutional: Negative for chills, diaphoresis, fatigue and fever.  HENT: Negative for congestion, sore throat and trouble swallowing.   Eyes: Negative for pain and visual disturbance.  Respiratory: Negative for cough, shortness of breath and wheezing.   Cardiovascular: Positive for chest pain. Negative for palpitations and leg swelling.  Gastrointestinal: Negative for abdominal distention, abdominal pain, diarrhea, nausea and vomiting.  Genitourinary: Negative for difficulty urinating.  Musculoskeletal: Negative for back pain, neck pain and neck stiffness.  Skin: Negative for pallor.  Neurological: Negative for dizziness, speech difficulty, weakness and headaches.  Psychiatric/Behavioral: Negative for confusion.    Physical Exam Updated Vital Signs BP 136/86   Pulse 77   Temp (!) 97.5 F (36.4 C)   Resp (!) 24   Ht _0  (1.803 m)   Wt 68 kg   SpO2 96%    BMI 20.92 kg/m   Physical Exam Constitutional:      General: He is not in acute distress.    Appearance: Normal appearance. He is not ill-appearing, toxic-appearing or diaphoretic.  HENT:  Mouth/Throat:     Mouth: Mucous membranes are moist.     Pharynx: Oropharynx is clear.  Eyes:     General: No scleral icterus.    Extraocular Movements: Extraocular movements intact.     Pupils: Pupils are equal, round, and reactive to light.  Cardiovascular:     Rate and Rhythm: Normal rate and regular rhythm.     Pulses: Normal pulses.     Heart sounds: Murmur heard.    Pulmonary:     Effort: Pulmonary effort is normal. No respiratory distress.     Breath sounds: Normal breath sounds. No stridor. No wheezing, rhonchi or rales.  Chest:     Chest wall: No tenderness.  Abdominal:     General: Abdomen is flat. There is no distension.     Palpations: Abdomen is soft.     Tenderness: There is no abdominal tenderness. There is no guarding or rebound.  Musculoskeletal:        General: No swelling or tenderness. Normal range of motion.     Cervical back: Normal range of motion and neck supple. No rigidity.     Right lower leg: No edema.     Left lower leg: No edema.  Skin:    General: Skin is warm and dry.     Capillary Refill: Capillary refill takes less than 2 seconds.     Coloration: Skin is not pale.  Neurological:     General: No focal deficit present.     Mental Status: He is alert and oriented to person, place, and time.  Psychiatric:        Mood and Affect: Mood normal.        Behavior: Behavior normal.     ED Results / Procedures / Treatments   Labs (all labs ordered are listed, but only abnormal results are displayed) Labs Reviewed  CBC - Abnormal; Notable for the following components:      Result Value   RBC 4.08 (*)    MCV 102.9 (*)    MCH 34.1 (*)    Platelets 93 (*)    All other components within normal limits  COMPREHENSIVE METABOLIC PANEL - Abnormal; Notable  for the following components:   Glucose, Bld 105 (*)    All other components within normal limits  TROPONIN I (HIGH SENSITIVITY) - Abnormal; Notable for the following components:   Troponin I (High Sensitivity) 23 (*)    All other components within normal limits  TROPONIN I (HIGH SENSITIVITY) - Abnormal; Notable for the following components:   Troponin I (High Sensitivity) 23 (*)    All other components within normal limits    EKG EKG Interpretation  Date/Time:  Saturday May 26 2020 07:20:08 EDT Ventricular Rate:  66 PR Interval:  140 QRS Duration: 104 QT Interval:  498 QTC Calculation: 522 R Axis:   72 Text Interpretation: Sinus rhythm Probable inferior infarct, old Prolonged QT interval Confirmed by Isla Pence [phone]) on 05/26/2020 7:25:55 AM   Radiology DG Chest 2 View  Result Date: 05/26/2020 CLINICAL DATA:  Chest pain EXAM: CHEST - 2 VIEW COMPARISON:  03/28/2020 FINDINGS: Lungs are clear.  No pleural effusion or pneumothorax. The heart is normal in size.  Thoracic aortic atherosclerosis. Degenerative changes of the visualized thoracolumbar spine. IMPRESSION: Normal chest radiographs. Electronically Signed   By: Julian Hy M.D.   On: 05/26/2020 07:54   CT CHEST LUNG CA SCREEN LOW DOSE W/O CM  Result Date: 05/25/2020 CLINICAL DATA:  Fifty pack-year smoking  history.  Current smoker. EXAM: CT CHEST WITHOUT CONTRAST LOW-DOSE FOR LUNG CANCER SCREENING TECHNIQUE: Multidetector CT imaging of the chest was performed following the standard protocol without IV contrast. COMPARISON:  03/28/2020 chest radiograph. 11/28/2016 CTA chest from [facility]. FINDINGS: Cardiovascular: Aortic atherosclerosis. Mild cardiomegaly, without pericardial effusion. Multivessel coronary artery atherosclerosis. Aortic valve calcification. Mediastinum/Nodes: Calcified right hilar nodes are likely related to old granulomatous disease. No mediastinal or definite hilar adenopathy, given  limitations of unenhanced CT. Lungs/Pleura: No pleural fluid. Moderate centrilobular emphysema with left lower lobe scarring. Right lower lobe calcified granuloma. A right middle lobe probable area of scarring measures volume derived equivalent diameter 5.7 mm. Upper Abdomen: Normal imaged portions of the liver, stomach, pancreas, gallbladder, adrenal glands, left kidney. Old granulomatous disease in the spleen. Musculoskeletal: Advanced thoracic spondylosis with accentuation of expected kyphosis. IMPRESSION: 1. Lung-RADS 2, benign appearance or behavior. Continue annual screening with low-dose chest CT without contrast in 12 months. 2. Aortic Atherosclerosis (ICD10-I70.0) and Emphysema (ICD10-J43.9). Coronary artery atherosclerosis. 3. Aortic valvular calcifications. Consider echocardiography to evaluate for valvular dysfunction. Electronically Signed   By: Abigail Miyamoto M.D.   On: 05/25/2020 10:40    Procedures Procedures   Medications Ordered in ED Medications  aspirin chewable tablet 324 mg (324 mg Oral Given 05/26/20 0753)  morphine 4 MG/ML injection 4 mg (4 mg Intravenous Given 05/26/20 6834)    ED Course  I have reviewed the triage vital signs and the nursing notes.  Pertinent labs & imaging results that were available during my care of the patient were reviewed by me and considered in my medical decision making (see chart for details).    MDM Rules/Calculators/A&P                          Izrael Peak is a 70 y.o. male with a hx of CAD s/p DES x2 to RCA, HTN, HLD, h/o CVA, EtOH abuse, tobacco abuse and COPD that presents the emerge department today for severe chest pain.  Chest pain started an hour ago, patient states that it feels like prior heart attack, concern for ACS.  Patient is hemodynamically stable, blood pressure 131/74.  No respiratory distress.  Pain better with nitro.  EKG interpreted me without any signs of ST elevations or depressions.  Will give full aspirin and morphine  at this time and await labs.  Patient story and history less concerning for dissection, PE.   First troponin at 23, will consult cardiology at this time since story is concerning for ACS.  Spoke to Dr. Stanford Breed, who will admit patient.  Second troponin  23, upon reassessment patient states the pain is a 1 out of 10, feels much better, stable for transfer.  The patient appears reasonably stabilized for admission considering the current resources, flow, and capabilities available in the ED at this time, and I doubt any other [facility] requiring further screening and/or treatment in the ED prior to admission.  I discussed this case with my attending physician who cosigned this note including patient's presenting symptoms, physical exam, and planned diagnostics and interventions. Attending physician stated agreement with plan or made changes to plan which were implemented.   Attending physician assessed patient at bedside.  Final Clinical Impression(s) / ED Diagnoses Final diagnoses:  Unstable angina [facility])    Rx / DC Orders ED Discharge Orders    None       Alfredia Client, PA-C 05/26/20 1022    Isla Pence,  MD 05/26/20 1339

## 2020-05-26 NOTE — Progress Notes (Signed)
ANTICOAGULATION CONSULT NOTE - Follow Up Consult  Pharmacy Consult for heparin Indication: USAP  Labs: Recent Labs    05/26/20 0715 05/26/20 0729 05/26/20 0917 05/26/20 1919  HGB 13.9  --   --   --   HCT 42.0  --   --   --   PLT 93*  --   --   --   HEPARINUNFRC  --   --   --  <0.10*  CREATININE  --  1.12  --   --   TROPONINIHS 23*  --  23*  --     Assessment: 70yo male subtherapeutic on heparin with initial dosing for USAP; no gtt issues or signs of bleeding per RN.  Goal of Therapy:  Heparin level 0.3-0.7 units/ml   Plan:  Will rebolus with heparin 2000 units and increase heparin gtt by 4 units/kg/hr to 1100 units/hr and check level in 8 hours.    Vernard Gambles, PharmD, BCPS  05/26/2020,8:23 PM

## 2020-05-26 NOTE — Progress Notes (Deleted)
ANTICOAGULATION CONSULT NOTE  Pharmacy Consult for Heparin Indication: chest pain/ACS  No Known Allergies  Patient Measurements: Height: _0  (180.3 cm) Weight: 65.6 kg (144 lb 9.6 oz) IBW/kg (Calculated) : 75.3 Heparin Dosing Weight: 68 kg  Vital Signs: Temp: 97.7 F (36.5 C) (03/26 1607) Temp Source: Oral (03/26 1607) BP: 133/84 (03/26 1607) Pulse Rate: 106 (03/26 1607)  Labs: Recent Labs    05/26/20 0715 05/26/20 0729 05/26/20 0917 05/26/20 1919  HGB 13.9  --   --   --   HCT 42.0  --   --   --   PLT 93*  --   --   --   HEPARINUNFRC  --   --   --  <0.10*  CREATININE  --  1.12  --   --   TROPONINIHS 23*  --  23*  --     Estimated Creatinine Clearance: 56.1 mL/min (by C-G formula based on SCr of 1.12 mg/dL).   Medical History: Past Medical History:  Diagnosis Date  . Abdominal pain 05/02/2019  . Alcoholism ([place])   . CAD (coronary artery disease)    a. 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 Chicken pox   . Depression   . ETOH abuse   . Genital warts   . Hepatic steatosis   . Hepatitis C    a. s/p [name] ~ 2003 in [place], [place] => stopped after 6 mos due to neutropenia - viral load noted to be low at that time;  no follow up since;   b. abdominal u/s 11/13:  diffuse hepatic steatosis and/or hepatocellular disease    . Hx of echocardiogram    a. Echo 01/06/12: Mild focal basal hypertrophy the septum, vigorous LV, EF 65-70%, Gr 2 diast dysfn, trivial AI, MAC, trivial MR, trivial TR, PASP 36  . Hyperlipidemia   . Hypertension   . Non-STEMI (non-ST elevated myocardial infarction) ([employer])   . Stroke [facility])    "minor" pt reported  . Thrombocytopenia ([place])   .  Tobacco abuse     Medications:  Scheduled:  . aspirin  324 mg Oral NOW   Or  . aspirin  300 mg Rectal NOW  . [START ON 05/27/2020] aspirin EC  81 mg Oral Daily  . atorvastatin  80 mg Oral q1800  . carvedilol  6.25 mg Oral BID  . ezetimibe  10 mg Oral Daily  . losartan  25 mg Oral Daily    Assessment: Patient is a 51 yom that is being admitted for unstable angina. Patient has a past hx of PCI, HTN, and prior CVA. Pharmacy has been asked to dose heparin at this time for CP/ACS. Patient noted to have slightly low platelets at this time @ 93.  -heparin level undetectable  Goal of Therapy:  Heparin level 0.3-0.7 units/ml Monitor platelets by anticoagulation protocol: Yes   Plan:  -Heparin bolus 2000 units then increase to 1050 units/hr -Recheck heparin level and CBC in am  Hildred Laser, PharmD Clinical Pharmacist **Pharmacist phone directory can now be found on [email or web address] ([email or web address]).  Listed under [place].

## 2020-05-26 NOTE — ED Triage Notes (Signed)
Pt. Stated, I started having chest pain about a hour ago. It was pretty intense. I had the same pain when I had the other attack. I took 3 Nitro and an Aleve and not help.

## 2020-05-27 ENCOUNTER — Inpatient Hospital Stay (HOSPITAL_COMMUNITY): Payer: Medicare PPO

## 2020-05-27 DIAGNOSIS — I200 Unstable angina: Secondary | ICD-10-CM | POA: Diagnosis not present

## 2020-05-27 LAB — BASIC METABOLIC PANEL
Anion gap: 6 (ref 5–15)
BUN: 18 mg/dL (ref 8–23)
CO2: 25 mmol/L (ref 22–32)
Calcium: 8.8 mg/dL — ABNORMAL LOW (ref 8.9–10.3)
Chloride: 103 mmol/L (ref 98–111)
Creatinine, Ser: 1.16 mg/dL (ref 0.61–1.24)
GFR, Estimated: >60 mL/min (ref >60)
Glucose, Bld: 113 mg/dL — ABNORMAL HIGH (ref 70–99)
Potassium: 4.2 mmol/L (ref 3.5–5.1)
Sodium: 134 mmol/L — ABNORMAL LOW (ref 135–145)

## 2020-05-27 LAB — CBC
HCT: 44 % (ref 39.0–52.0)
Hemoglobin: 14.9 g/dL (ref 13.0–17.0)
MCH: 34.3 pg — ABNORMAL HIGH (ref 26.0–34.0)
MCHC: 33.9 g/dL (ref 30.0–36.0)
MCV: 101.1 fL — ABNORMAL HIGH (ref 80.0–100.0)
Platelets: 94 10*3/uL — ABNORMAL LOW (ref 150–400)
RBC: 4.35 MIL/uL (ref 4.22–5.81)
RDW: 14.9 % (ref 11.5–15.5)
WBC: 7.6 10*3/uL (ref 4.0–10.5)
nRBC: 0 % (ref 0.0–0.2)

## 2020-05-27 LAB — LIPID PANEL
Cholesterol: 71 mg/dL (ref 0–200)
HDL: 30 mg/dL — ABNORMAL LOW (ref >40)
LDL Cholesterol: 34 mg/dL (ref 0–99)
Total CHOL/HDL Ratio: 2.4 RATIO
Triglycerides: 36 mg/dL (ref <150)
VLDL: 7 mg/dL (ref 0–40)

## 2020-05-27 LAB — TROPONIN I (HIGH SENSITIVITY)
Troponin I (High Sensitivity): 17 ng/L (ref <18)
Troponin I (High Sensitivity): 17 ng/L

## 2020-05-27 LAB — D-DIMER, QUANTITATIVE: D-Dimer, Quant: 1.56 ug{FEU}/mL — ABNORMAL HIGH (ref 0.00–0.50)

## 2020-05-27 LAB — HEPARIN LEVEL (UNFRACTIONATED)
Heparin Unfractionated: 0.15 IU/mL — ABNORMAL LOW (ref 0.30–0.70)
Heparin Unfractionated: 0.24 IU/mL — ABNORMAL LOW (ref 0.30–0.70)

## 2020-05-27 MED ORDER — ASPIRIN 81 MG PO CHEW
81.0000 mg | CHEWABLE_TABLET | ORAL | Status: AC
  Administered 2020-05-28: 81 mg via ORAL
  Filled 2020-05-27: qty 1

## 2020-05-27 MED ORDER — SODIUM CHLORIDE 0.9% FLUSH: 3.0000 mL | INTRAVENOUS | Status: DC | PRN

## 2020-05-27 MED ORDER — SODIUM CHLORIDE 0.9% FLUSH: 3.0000 mL | Freq: Two times a day (BID) | INTRAVENOUS | Status: DC

## 2020-05-27 MED ORDER — HEPARIN BOLUS VIA INFUSION
2000.0000 [IU] | Freq: Once | INTRAVENOUS | Status: AC
  Administered 2020-05-27: 2000 [IU] via INTRAVENOUS
  Filled 2020-05-27: qty 2000

## 2020-05-27 MED ORDER — SODIUM CHLORIDE 0.9 % WEIGHT BASED INFUSION
1.0000 mL/kg/h | INTRAVENOUS | Status: DC
  Administered 2020-05-28: 1 mL/kg/h via INTRAVENOUS

## 2020-05-27 MED ORDER — SODIUM CHLORIDE 0.9 % WEIGHT BASED INFUSION
3.0000 mL/kg/h | INTRAVENOUS | Status: DC
  Administered 2020-05-28: 3 mL/kg/h via INTRAVENOUS

## 2020-05-27 MED ORDER — SODIUM CHLORIDE 0.9 % IV SOLN: 250.0000 mL | INTRAVENOUS | Status: DC | PRN

## 2020-05-27 MED ORDER — IOHEXOL 350 MG/ML SOLN
75.0000 mL | Freq: Once | INTRAVENOUS | Status: AC | PRN
  Administered 2020-05-27: 75 mL via INTRAVENOUS

## 2020-05-27 NOTE — Progress Notes (Signed)
ANTICOAGULATION CONSULT NOTE - Follow Up Consult  Pharmacy Consult for Heparin Indication: chest pain/ACS  No Known Allergies  Patient Measurements: Height: 5\' 11"  (180.3 cm) Weight: 67.3 kg (148 lb 5.9 oz) IBW/kg (Calculated) : 75.3 kg Heparin Dosing Weight: 65.6 kg  Vital Signs: Temp: 98.9 F (37.2 C) (03/27 0602) Temp Source: Oral (03/27 0602) BP: 128/76 (03/27 0731) Pulse Rate: 74 (03/27 0731)  Labs: Recent Labs    05/26/20 0715 05/26/20 0729 05/26/20 0917 05/26/20 1919 05/27/20 0339  HGB 13.9  --   --   --  14.9  HCT 42.0  --   --   --  44.0  PLT 93*  --   --   --  94*  HEPARINUNFRC  --   --   --  <0.10* 0.15*  CREATININE  --  1.12  --   --  1.16  TROPONINIHS 23*  --  23*  --   --     Estimated Creatinine Clearance: 55.6 mL/min (by C-G formula based on SCr of 1.16 mg/dL).   Assessment: 70 yo male with a history of CAD s/p PCI of the R coronary artery presents with chest pain. The patient is planned for cardiac catheterization on 3/28. PTA the patient is not on anticoagulation. Pharmacy is consulted to dose heparin.  Heparin level is 0.24 at while running at 1250 units/hr. CBC remains stable from yesterday, Hgb 14.9, platelets 94. Per the RN, there have been no issues with the infusion and the patient is without signs or symptoms of bleeding.   Goal of Therapy:  Heparin level 0.3-0.7 units/ml Monitor platelets by anticoagulation protocol: Yes   Plan:  Increase heparin IV to 1350 units/hr Obtain daily heparin level and CBC Monitor for signs and symptoms of bleeding  4/28, PharmD, RPh  PGY-1 Pharmacy Resident 05/27/2020 9:23 AM  Please check [email or web address] for unit-specific pharmacy phone numbers.

## 2020-05-27 NOTE — Plan of Care (Signed)

## 2020-05-27 NOTE — Progress Notes (Signed)
    Called with elevated Ddimer of 1.56. EKG unchanged this morning showing NSR, no ST/T wave changes. Will order CT angio chest to rule out PE as symptoms seem somewhat pleuritic in nature.   SignedLaverda Page, NP-C 05/27/2020, 11:22 AM Pager: [phone]

## 2020-05-27 NOTE — Progress Notes (Signed)
ANTICOAGULATION CONSULT NOTE  Pharmacy Consult for Heparin Indication: chest pain/ACS  No Known Allergies  Patient Measurements: Height: _0  (180.3 cm) Weight: 65.6 kg (144 lb 9.6 oz) IBW/kg (Calculated) : 75.3 Heparin Dosing Weight: 68 kg  Vital Signs: Temp: 99.8 F (37.7 C) (03/26 2045) Temp Source: Oral (03/26 2045) BP: 102/52 (03/26 2045) Pulse Rate: 88 (03/26 2045)  Labs: Recent Labs    05/26/20 0715 05/26/20 0729 05/26/20 0917 05/26/20 1919 05/27/20 0339  HGB 13.9  --   --   --  14.9  HCT 42.0  --   --   --  44.0  PLT 93*  --   --   --  94*  HEPARINUNFRC  --   --   --  <0.10* 0.15*  CREATININE  --  1.12  --   --  1.16  TROPONINIHS 23*  --  23*  --   --     Estimated Creatinine Clearance: 54.2 mL/min (by C-G formula based on SCr of 1.16 mg/dL).   Medical History: Past Medical History:  Diagnosis Date  . Abdominal pain 05/02/2019  . Alcoholism ([place])   . CAD (coronary artery disease)    a. 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 Chicken pox   . Depression   . ETOH abuse   . Genital warts   . Hepatic steatosis   . Hepatitis C    a. s/p [name] ~ 2003 in [place], [place] => stopped after 6 mos due to neutropenia - viral load noted to be low at that time;  no follow up since;   b. abdominal u/s 11/13:  diffuse hepatic steatosis and/or hepatocellular disease    . Hx of echocardiogram    a. Echo 01/06/12: Mild focal basal hypertrophy the septum, vigorous LV, EF 65-70%, Gr 2 diast dysfn, trivial AI, MAC, trivial MR, trivial TR, PASP 36  . Hyperlipidemia   . Hypertension   . Non-STEMI (non-ST elevated myocardial infarction) ([place])   . Stroke [facility])    "minor"  pt reported  . Thrombocytopenia ([place])   . Tobacco abuse     Medications:  Scheduled:  . aspirin  324 mg Oral NOW   Or  . aspirin  300 mg Rectal NOW  . aspirin EC  81 mg Oral Daily  . atorvastatin  80 mg Oral q1800  . carvedilol  6.25 mg Oral BID  . ezetimibe  10 mg Oral Daily  . heparin  2,000 Units Intravenous Once  . losartan  25 mg Oral Daily    Assessment: Patient is a 59 yom that is being admitted for unstable angina. Patient has a past hx of PCI, HTN, and prior CVA. Pharmacy has been asked to dose heparin at this time for CP/ACS. Patient noted to have slightly low platelets at this time @ 93. Platelets on labs from 3/21 were around 118. Plan to monitor closely.  3/27 AM update:  Heparin level low but trending up H/H good Plts remain low but unchanged from yesterday  Goal of Therapy:  Heparin level 0.3-0.7 units/ml Monitor platelets by anticoagulation protocol: Yes   Plan:  - Heparin bolus 2000 units IV  x 1 dose - Inc Heparin drip to 1250 units/hr - Heparin level in ~ 6-8 hours  - Monitor patient for s/s of bleeding and CBC while on heparin   Narda Bonds, PharmD, [place] Pharmacist Phone: [phone]

## 2020-05-27 NOTE — Progress Notes (Addendum)
Progress Note  Patient Name: Hunter Mcmillan Date of Encounter: 05/27/2020  [facility] Cardiologist: Peter Swaziland, MD   Subjective   Still with mild chest pain never completely resolving.  Mild dyspnea.  Pain increases with inspiration.  Inpatient Medications    Scheduled Meds: . aspirin  324 mg Oral NOW   Or  . aspirin  300 mg Rectal NOW  . aspirin EC  81 mg Oral Daily  . atorvastatin  80 mg Oral q1800  . carvedilol  6.25 mg Oral BID  . ezetimibe  10 mg Oral Daily  . losartan  25 mg Oral Daily   Continuous Infusions: . heparin 1,250 Units/hr (05/27/20 0728)   PRN Meds: acetaminophen, albuterol, nitroGLYCERIN, ondansetron (ZOFRAN) IV   Vital Signs    Vitals:   05/26/20 1607 05/26/20 2045 05/27/20 0602 05/27/20 0624  BP: 133/84 (!) 102/52 110/60   Pulse: (!) 106 88 72 75  Resp:  16 16   Temp: 97.7 F (36.5 C) 99.8 F (37.7 C) 98.9 F (37.2 C)   TempSrc: Oral Oral Oral   SpO2: 96% 95% 96% 96%  Weight:    67.3 kg  Height:        Intake/Output Summary (Last 24 hours) at 05/27/2020 0743 Last data filed at 05/27/2020 0630 Gross per 24 hour  Intake 4669.55 ml  Output 1250 ml  Net 3419.55 ml   Last 3 Weights 05/27/2020 05/26/2020 05/26/2020  Weight (lbs) 148 lb 5.9 oz 144 lb 9.6 oz 150 lb  Weight (kg) 67.3 kg 65.59 kg 68.04 kg      Telemetry    Sinus with occasional PVC- Personally Reviewed  Physical Exam   GEN: No acute distress.   Neck: No JVD Cardiac: RRR, no murmurs, rubs, or gallops.  Respiratory: Clear to auscultation bilaterally. GI: Soft, nontender, non-distended  MS: No edema Neuro:  Nonfocal  Psych: Normal affect   Labs    High Sensitivity Troponin:   Recent Labs  Lab 05/26/20 0715 05/26/20 0917  TROPONINIHS 23* 23*      Chemistry Recent Labs  Lab 05/21/20 1352 05/26/20 0729 05/27/20 0339  NA 139 137 134*  K 4.3 3.8 4.2  CL 101 103 103  CO2 32 25 25  GLUCOSE 130* 105* 113*  BUN 17 19 18   CREATININE 1.21 1.12 1.16   CALCIUM 9.6 9.0 8.8*  PROT 7.1 6.6  --   ALBUMIN 4.1 3.5  --   AST 23 27  --   ALT 16 18  --   ALKPHOS 83 75  --   BILITOT 0.9 0.7  --   GFRNONAA  --  >60 >60  ANIONGAP  --  9 6     Hematology Recent Labs  Lab 05/21/20 1352 05/26/20 0715 05/27/20 0339  WBC 6.5 6.1 7.6  RBC 4.49 4.08* 4.35  HGB 15.6 13.9 14.9  HCT 45.9 42.0 44.0  MCV 102.2* 102.9* 101.1*  MCH  --  34.1* 34.3*  MCHC 33.9 33.1 33.9  RDW 15.9* 14.6 14.9  PLT 118.0* 93* 94*    Radiology    DG Chest 2 View  Result Date: 05/26/2020 CLINICAL DATA:  Chest pain EXAM: CHEST - 2 VIEW COMPARISON:  03/28/2020 FINDINGS: Lungs are clear.  No pleural effusion or pneumothorax. The heart is normal in size.  Thoracic aortic atherosclerosis. Degenerative changes of the visualized thoracolumbar spine. IMPRESSION: Normal chest radiographs. Electronically Signed   By: 03/30/2020 M.D.   On: 05/26/2020 07:54    Patient  Profile     70 y.o. male with past medical history of coronary artery disease, hypertension, hyperlipidemia, prior CVA, alcohol abuse, tobacco abuse, COPD admitted with chest pain.  Assessment & Plan    1. Unstable angina-patient symptoms with both typical and atypical features.  Enzymes not consistent with acute coronary syndrome.  We will repeat electrocardiogram this morning.  Repeat echocardiogram is pending.  For cardiac catheterization tomorrow.  The risks and benefits were previously discussed including myocardial infarction, CVA and death and he agreed to proceed.  Continue aspirin, statin, heparin and beta-blocker.  Chest pain also with mild pleuritic component.  No risk factors for pulmonary embolus.  Check D-dimer. 2. Hyperlipidemia-continue statin.   3. Hypertension-blood pressure is controlled; continue present medications and follow. 4. Tobacco abuse-patient counseled on discontinuing. 5. Ischemic cardiomyopathy-most recent echocardiogram showed ejection fraction 40 to 45%.    We will repeat  study.  Continue beta-blocker and ARB. 6. Thrombocytopenia-platelet count unchanged this morning.  Continue to follow.   For questions or updates, please contact [facility] Please consult [email or web address] for contact info under        Signed, Olga Millers, MD  05/27/2020, 7:43 AM

## 2020-05-27 NOTE — H&P (View-Only) (Signed)
 Progress Note  Patient Name: Hunter Mcmillan Date of Encounter: 05/27/2020  [facility] Cardiologist: Peter Jordan, MD   Subjective   Still with mild chest pain never completely resolving.  Mild dyspnea.  Pain increases with inspiration.  Inpatient Medications    Scheduled Meds: . aspirin  324 mg Oral NOW   Or  . aspirin  300 mg Rectal NOW  . aspirin EC  81 mg Oral Daily  . atorvastatin  80 mg Oral q1800  . carvedilol  6.25 mg Oral BID  . ezetimibe  10 mg Oral Daily  . losartan  25 mg Oral Daily   Continuous Infusions: . heparin 1,250 Units/hr (05/27/20 0728)   PRN Meds: acetaminophen, albuterol, nitroGLYCERIN, ondansetron (ZOFRAN) IV   Vital Signs    Vitals:   05/26/20 1607 05/26/20 2045 05/27/20 0602 05/27/20 0624  BP: 133/84 (!) 102/52 110/60   Pulse: (!) 106 88 72 75  Resp:  16 16   Temp: 97.7 F (36.5 C) 99.8 F (37.7 C) 98.9 F (37.2 C)   TempSrc: Oral Oral Oral   SpO2: 96% 95% 96% 96%  Weight:    67.3 kg  Height:        Intake/Output Summary (Last 24 hours) at 05/27/2020 0743 Last data filed at 05/27/2020 0630 Gross per 24 hour  Intake 4669.55 ml  Output 1250 ml  Net 3419.55 ml   Last 3 Weights 05/27/2020 05/26/2020 05/26/2020  Weight (lbs) 148 lb 5.9 oz 144 lb 9.6 oz 150 lb  Weight (kg) 67.3 kg 65.59 kg 68.04 kg      Telemetry    Sinus with occasional PVC- Personally Reviewed  Physical Exam   GEN: No acute distress.   Neck: No JVD Cardiac: RRR, no murmurs, rubs, or gallops.  Respiratory: Clear to auscultation bilaterally. GI: Soft, nontender, non-distended  MS: No edema Neuro:  Nonfocal  Psych: Normal affect   Labs    High Sensitivity Troponin:   Recent Labs  Lab 05/26/20 0715 05/26/20 0917  TROPONINIHS 23* 23*      Chemistry Recent Labs  Lab 05/21/20 1352 05/26/20 0729 05/27/20 0339  NA 139 137 134*  K 4.3 3.8 4.2  CL 101 103 103  CO2 32 25 25  GLUCOSE 130* 105* 113*  BUN 17 19 18  CREATININE 1.21 1.12 1.16   CALCIUM 9.6 9.0 8.8*  PROT 7.1 6.6  --   ALBUMIN 4.1 3.5  --   AST 23 27  --   ALT 16 18  --   ALKPHOS 83 75  --   BILITOT 0.9 0.7  --   GFRNONAA  --  >60 >60  ANIONGAP  --  9 6     Hematology Recent Labs  Lab 05/21/20 1352 05/26/20 0715 05/27/20 0339  WBC 6.5 6.1 7.6  RBC 4.49 4.08* 4.35  HGB 15.6 13.9 14.9  HCT 45.9 42.0 44.0  MCV 102.2* 102.9* 101.1*  MCH  --  34.1* 34.3*  MCHC 33.9 33.1 33.9  RDW 15.9* 14.6 14.9  PLT 118.0* 93* 94*    Radiology    DG Chest 2 View  Result Date: 05/26/2020 CLINICAL DATA:  Chest pain EXAM: CHEST - 2 VIEW COMPARISON:  03/28/2020 FINDINGS: Lungs are clear.  No pleural effusion or pneumothorax. The heart is normal in size.  Thoracic aortic atherosclerosis. Degenerative changes of the visualized thoracolumbar spine. IMPRESSION: Normal chest radiographs. Electronically Signed   By: Sriyesh  Krishnan M.D.   On: 05/26/2020 07:54    Patient   Profile     70 y.o. male with past medical history of coronary artery disease, hypertension, hyperlipidemia, prior CVA, alcohol abuse, tobacco abuse, COPD admitted with chest pain.  Assessment & Plan    1. Unstable angina-patient symptoms with both typical and atypical features.  Enzymes not consistent with acute coronary syndrome.  We will repeat electrocardiogram this morning.  Repeat echocardiogram is pending.  For cardiac catheterization tomorrow.  The risks and benefits were previously discussed including myocardial infarction, CVA and death and he agreed to proceed.  Continue aspirin, statin, heparin and beta-blocker.  Chest pain also with mild pleuritic component.  No risk factors for pulmonary embolus.  Check D-dimer. 2. Hyperlipidemia-continue statin.   3. Hypertension-blood pressure is controlled; continue present medications and follow. 4. Tobacco abuse-patient counseled on discontinuing. 5. Ischemic cardiomyopathy-most recent echocardiogram showed ejection fraction 40 to 45%.    We will repeat  study.  Continue beta-blocker and ARB. 6. Thrombocytopenia-platelet count unchanged this morning.  Continue to follow.   For questions or updates, please contact [facility] Please consult [email or web address] for contact info under        Signed, Olga Millers, MD  05/27/2020, 7:43 AM

## 2020-05-28 ENCOUNTER — Encounter (HOSPITAL_COMMUNITY)
Admission: EM | Disposition: A | Discharge status: Home / Self Care | Payer: Self-pay | Source: Home / Self Care | Attending: Cardiology

## 2020-05-28 ENCOUNTER — Other Ambulatory Visit: Payer: Self-pay | Admitting: Physician Assistant

## 2020-05-28 ENCOUNTER — Inpatient Hospital Stay (HOSPITAL_COMMUNITY): Payer: Medicare PPO

## 2020-05-28 ENCOUNTER — Telehealth: Payer: Self-pay | Admitting: Cardiology

## 2020-05-28 ENCOUNTER — Encounter (HOSPITAL_COMMUNITY): Payer: Self-pay | Admitting: Cardiovascular Disease

## 2020-05-28 DIAGNOSIS — R072 Precordial pain: Secondary | ICD-10-CM | POA: Diagnosis not present

## 2020-05-28 DIAGNOSIS — I2510 Atherosclerotic heart disease of native coronary artery without angina pectoris: Secondary | ICD-10-CM

## 2020-05-28 DIAGNOSIS — Z79899 Other long term (current) drug therapy: Secondary | ICD-10-CM

## 2020-05-28 DIAGNOSIS — E782 Mixed hyperlipidemia: Secondary | ICD-10-CM | POA: Diagnosis not present

## 2020-05-28 DIAGNOSIS — I25119 Atherosclerotic heart disease of native coronary artery with unspecified angina pectoris: Secondary | ICD-10-CM | POA: Diagnosis not present

## 2020-05-28 DIAGNOSIS — R079 Chest pain, unspecified: Secondary | ICD-10-CM | POA: Diagnosis not present

## 2020-05-28 DIAGNOSIS — I5042 Chronic combined systolic (congestive) and diastolic (congestive) heart failure: Secondary | ICD-10-CM

## 2020-05-28 DIAGNOSIS — I255 Ischemic cardiomyopathy: Secondary | ICD-10-CM | POA: Diagnosis not present

## 2020-05-28 HISTORY — PX: LEFT HEART CATH AND CORONARY ANGIOGRAPHY: CATH118249

## 2020-05-28 LAB — CBC
HCT: 40.7 % (ref 39.0–52.0)
Hemoglobin: 13.9 g/dL (ref 13.0–17.0)
MCH: 34.3 pg — ABNORMAL HIGH (ref 26.0–34.0)
MCHC: 34.2 g/dL (ref 30.0–36.0)
MCV: 100.5 fL — ABNORMAL HIGH (ref 80.0–100.0)
Platelets: 101 10*3/uL — ABNORMAL LOW (ref 150–400)
RBC: 4.05 MIL/uL — ABNORMAL LOW (ref 4.22–5.81)
RDW: 14.6 % (ref 11.5–15.5)
WBC: 6.4 10*3/uL (ref 4.0–10.5)
nRBC: 0 % (ref 0.0–0.2)

## 2020-05-28 LAB — BASIC METABOLIC PANEL
Anion gap: 6 (ref 5–15)
BUN: 14 mg/dL (ref 8–23)
CO2: 25 mmol/L (ref 22–32)
Calcium: 8.4 mg/dL — ABNORMAL LOW (ref 8.9–10.3)
Chloride: 103 mmol/L (ref 98–111)
Creatinine, Ser: 1 mg/dL (ref 0.61–1.24)
GFR, Estimated: >60 mL/min (ref >60)
Glucose, Bld: 115 mg/dL — ABNORMAL HIGH (ref 70–99)
Potassium: 3.6 mmol/L (ref 3.5–5.1)
Sodium: 134 mmol/L — ABNORMAL LOW (ref 135–145)

## 2020-05-28 LAB — ECHOCARDIOGRAM COMPLETE
Area-P 1/2: 3.74 cm2
Calc EF: 38.9 %
Height: 71 in
S' Lateral: 4.2 cm
Single Plane A2C EF: 38.8 %
Single Plane A4C EF: 38.4 %
Weight: 2310.4 oz

## 2020-05-28 LAB — HEPARIN LEVEL (UNFRACTIONATED): Heparin Unfractionated: 0.26 IU/mL — ABNORMAL LOW (ref 0.30–0.70)

## 2020-05-28 SURGERY — LEFT HEART CATH AND CORONARY ANGIOGRAPHY
Anesthesia: LOCAL

## 2020-05-28 MED ORDER — SODIUM CHLORIDE 0.9% FLUSH: 3.0000 mL | INTRAVENOUS | Status: DC | PRN

## 2020-05-28 MED ORDER — SODIUM CHLORIDE 0.9% FLUSH
3.0000 mL | Freq: Two times a day (BID) | INTRAVENOUS | Status: DC
  Administered 2020-05-28: 3 mL via INTRAVENOUS

## 2020-05-28 MED ORDER — FUROSEMIDE 20 MG PO TABS: 20.0000 mg | ORAL_TABLET | Freq: Every day | ORAL | 6 refills | Status: DC

## 2020-05-28 MED ORDER — HEPARIN (PORCINE) IN NACL 1000-0.9 UT/500ML-% IV SOLN
INTRAVENOUS | Status: DC | PRN
  Administered 2020-05-28 (×2): 500 mL

## 2020-05-28 MED ORDER — SODIUM CHLORIDE 0.9 % IV SOLN: INTRAVENOUS | Status: AC

## 2020-05-28 MED ORDER — LABETALOL HCL 5 MG/ML IV SOLN: 10.0000 mg | INTRAVENOUS | Status: AC | PRN

## 2020-05-28 MED ORDER — VERAPAMIL HCL 2.5 MG/ML IV SOLN
INTRAVENOUS | Status: DC | PRN
  Administered 2020-05-28: 10 mL via INTRA_ARTERIAL

## 2020-05-28 MED ORDER — MIDAZOLAM HCL 2 MG/2ML IJ SOLN
INTRAMUSCULAR | Status: DC | PRN
  Administered 2020-05-28: 1 mg via INTRAVENOUS

## 2020-05-28 MED ORDER — HYDRALAZINE HCL 20 MG/ML IJ SOLN: 10.0000 mg | INTRAMUSCULAR | Status: AC | PRN

## 2020-05-28 MED ORDER — FUROSEMIDE 20 MG PO TABS
20.0000 mg | ORAL_TABLET | Freq: Every day | ORAL | Status: DC
  Administered 2020-05-28: 20 mg via ORAL
  Filled 2020-05-28: qty 1

## 2020-05-28 MED ORDER — HEPARIN SODIUM (PORCINE) 1000 UNIT/ML IJ SOLN
INTRAMUSCULAR | Status: AC
  Filled 2020-05-28: qty 1

## 2020-05-28 MED ORDER — HEPARIN (PORCINE) IN NACL 1000-0.9 UT/500ML-% IV SOLN
INTRAVENOUS | Status: AC
  Filled 2020-05-28: qty 1000

## 2020-05-28 MED ORDER — LOSARTAN POTASSIUM 25 MG PO TABS: 25.0000 mg | ORAL_TABLET | Freq: Every day | ORAL | 6 refills | Status: DC

## 2020-05-28 MED ORDER — LIDOCAINE HCL (PF) 1 % IJ SOLN
INTRAMUSCULAR | Status: DC | PRN
  Administered 2020-05-28: 2 mL

## 2020-05-28 MED ORDER — HEPARIN SODIUM (PORCINE) 1000 UNIT/ML IJ SOLN
INTRAMUSCULAR | Status: DC | PRN
  Administered 2020-05-28: 3500 [IU] via INTRAVENOUS

## 2020-05-28 MED ORDER — FENTANYL CITRATE (PF) 100 MCG/2ML IJ SOLN
INTRAMUSCULAR | Status: AC
  Filled 2020-05-28: qty 2

## 2020-05-28 MED ORDER — SODIUM CHLORIDE 0.9 % IV SOLN
250.0000 mL | INTRAVENOUS | Status: DC | PRN
Start: 2020-05-28 — End: 2020-05-28

## 2020-05-28 MED ORDER — FENTANYL CITRATE (PF) 100 MCG/2ML IJ SOLN
INTRAMUSCULAR | Status: DC | PRN
  Administered 2020-05-28: 25 ug via INTRAVENOUS

## 2020-05-28 MED ORDER — CARVEDILOL 6.25 MG PO TABS: 6.2500 mg | ORAL_TABLET | Freq: Two times a day (BID) | ORAL | 3 refills | Status: AC

## 2020-05-28 MED ORDER — LIDOCAINE HCL (PF) 1 % IJ SOLN
INTRAMUSCULAR | Status: AC
  Filled 2020-05-28: qty 30

## 2020-05-28 MED ORDER — VERAPAMIL HCL 2.5 MG/ML IV SOLN
INTRAVENOUS | Status: AC
  Filled 2020-05-28: qty 2

## 2020-05-28 MED ORDER — IOHEXOL 350 MG/ML SOLN
INTRAVENOUS | Status: DC | PRN
  Administered 2020-05-28: 50 mL

## 2020-05-28 MED ORDER — MIDAZOLAM HCL 2 MG/2ML IJ SOLN
INTRAMUSCULAR | Status: AC
  Filled 2020-05-28: qty 2

## 2020-05-28 SURGICAL SUPPLY — 9 items
CATH 5FR JL3.5 JR4 ANG PIG MP (CATHETERS) ×2 IMPLANT
DEVICE RAD COMP TR BAND LRG (VASCULAR PRODUCTS) ×2 IMPLANT
GLIDESHEATH SLEND SS 6F .021 (SHEATH) ×2 IMPLANT
GUIDEWIRE INQWIRE 1.5J.035X260 (WIRE) ×1 IMPLANT
INQWIRE 1.5J .035X260CM (WIRE) ×2
KIT HEART LEFT (KITS) ×2 IMPLANT
PACK CARDIAC CATHETERIZATION (CUSTOM PROCEDURE TRAY) ×2 IMPLANT
TRANSDUCER W/STOPCOCK (MISCELLANEOUS) ×2 IMPLANT
TUBING CIL FLEX 10 FLL-RA (TUBING) ×2 IMPLANT

## 2020-05-28 NOTE — Progress Notes (Addendum)
Progress Note  Patient Name: Hunter Mcmillan Date of Encounter: 05/28/2020  [facility] Cardiologist: Peter Swaziland, MD   Subjective   Feeling well. No chest pain, sob or palpitations. Has back pain.   Resting comfortably when I saw him this morning.  In good spirits.  Inpatient Medications    Scheduled Meds: . aspirin EC  81 mg Oral Daily  . atorvastatin  80 mg Oral q1800  . carvedilol  6.25 mg Oral BID  . ezetimibe  10 mg Oral Daily  . losartan  25 mg Oral Daily  . sodium chloride flush  3 mL Intravenous Q12H   Continuous Infusions: . sodium chloride    . sodium chloride 1 mL/kg/hr (05/28/20 0503)  . heparin 1,350 Units/hr (05/28/20 0316)   PRN Meds: sodium chloride, acetaminophen, albuterol, nitroGLYCERIN, ondansetron (ZOFRAN) IV, sodium chloride flush   Vital Signs    Vitals:   05/27/20 2019 05/28/20 0034 05/28/20 0519 05/28/20 0750  BP: 110/87 101/65 113/76 108/75  Pulse: 82 76 70 71  Resp: 16   16  Temp: 98.8 F (37.1 C) 98.8 F (37.1 C) 97.6 F (36.4 C) 97.8 F (36.6 C)  TempSrc: Oral Oral Oral Oral  SpO2: 94% 96% 97% 98%  Weight:   65.5 kg   Height:        Intake/Output Summary (Last 24 hours) at 05/28/2020 0809 Last data filed at 05/28/2020 0503 Gross per 24 hour  Intake 624.83 ml  Output 1050 ml  Net -425.17 ml   Last 3 Weights 05/28/2020 05/27/2020 05/26/2020  Weight (lbs) 144 lb 6.4 oz 148 lb 5.9 oz 144 lb 9.6 oz  Weight (kg) 65.499 kg 67.3 kg 65.59 kg      Telemetry    NSR with PVCs - Personally Reviewed  ECG    N/A  Physical Exam   GEN: No acute distress.   Neck: No JVD Cardiac: RRR, no murmurs, rubs, or gallops.  Respiratory: diminished breath sound at bases, scattered wheezing -> mild crackles that clear with cough GI: Soft, nontender, non-distended  MS: No edema; No deformity. Neuro:  Nonfocal  Psych: Normal affect   Labs    High Sensitivity Troponin:   Recent Labs  Lab 05/26/20 0715 05/26/20 0917 05/27/20 0822  05/27/20 0939  TROPONINIHS 23* 23* 17 17      Chemistry Recent Labs  Lab 05/21/20 1352 05/26/20 0729 05/27/20 0339 05/28/20 0201  NA 139 137 134* 134*  K 4.3 3.8 4.2 3.6  CL 101 103 103 103  CO2 32 25 25 25   GLUCOSE 130* 105* 113* 115*  BUN 17 19 18 14   CREATININE 1.21 1.12 1.16 1.00  CALCIUM 9.6 9.0 8.8* 8.4*  PROT 7.1 6.6  --   --   ALBUMIN 4.1 3.5  --   --   AST 23 27  --   --   ALT 16 18  --   --   ALKPHOS 83 75  --   --   BILITOT 0.9 0.7  --   --   GFRNONAA  --  >60 >60 >60  ANIONGAP  --  9 6 6      Hematology Recent Labs  Lab 05/26/20 0715 05/27/20 0339 05/28/20 0201  WBC 6.1 7.6 6.4  RBC 4.08* 4.35 4.05*  HGB 13.9 14.9 13.9  HCT 42.0 44.0 40.7  MCV 102.9* 101.1* 100.5*  MCH 34.1* 34.3* 34.3*  MCHC 33.1 33.9 34.2  RDW 14.6 14.9 14.6  PLT 93* 94* 101*  BNPNo results for input(s): BNP, PROBNP in the last 168 hours.   DDimer  Recent Labs  Lab 05/27/20 0822  DDIMER 1.56*     Radiology    CT ANGIO CHEST PE W OR WO CONTRAST  Result Date: 05/27/2020 CLINICAL DATA:  PE suspected EXAM: CT ANGIOGRAPHY CHEST WITH CONTRAST TECHNIQUE: Multidetector CT imaging of the chest was performed using the standard protocol during bolus administration of intravenous contrast. Multiplanar CT image reconstructions and MIPs were obtained to evaluate the vascular anatomy. CONTRAST:  48mL OMNIPAQUE IOHEXOL 350 MG/ML SOLN COMPARISON:  05/24/2020 FINDINGS: Cardiovascular: Satisfactory opacification of the pulmonary arteries to the segmental level. No evidence of pulmonary embolism. Cardiomegaly. Left coronary artery calcifications. No pericardial effusion. Mediastinum/Nodes: No enlarged mediastinal, hilar, or axillary lymph nodes. Thyroid gland, trachea, and esophagus demonstrate no significant findings. Lungs/Pleura: Moderate centrilobular emphysema. Diffuse bilateral bronchial wall thickening. Trace bilateral pleural effusions and associated atelectasis or consolidation. Upper  Abdomen: No acute abnormality. Musculoskeletal: No chest wall abnormality. No acute or significant osseous findings. Review of the MIP images confirms the above findings. IMPRESSION: 1. Negative examination for pulmonary embolism. 2. Trace bilateral pleural effusions and associated atelectasis or consolidation. 3. Emphysema. 4. Diffuse bilateral bronchial wall thickening, consistent with nonspecific infectious or inflammatory bronchitis. 5. Cardiomegaly and coronary artery disease. Emphysema (ICD10-J43.9). Electronically Signed   By: Lauralyn Primes M.D.   On: 05/27/2020 13:48    Cardiac Studies   Pending cath and echo   Patient Profile     70 y.o. male with past medical history of coronary artery disease, hypertension, hyperlipidemia, prior CVA, alcohol abuse, tobacco abuse, COPD admitted with chest pain -> concerning for unstable angina.  Assessment & Plan    1.  Unstable angina -Mixed symptoms.  EKG without acute ischemia.  Negative high-sensitivity troponin.  CT angio of chest without pulmonary embolism. Pending echo. For cath later today. -     Continue aspirin, statin, heparin and beta-blocker  2. Chronic systolic and diastolic CHF/ICM - LVEF of 40-45% in 2021. - Pending repeat echo this admission - Continue BB and ARB  Not currently on diuretic.  We will assess his LVEDP today.  3. HLD - 05/27/2020: Cholesterol 71; HDL 30; LDL Cholesterol 34; Triglycerides 36; VLDL 7  - Continue statin and zetia   4. HTN - BP stable on current medications  5. Tobacco abuse - Recommended smoking cessation   6. Thrombocytopenia - Platelets improved   For questions or updates, please contact [facility] Please consult [email or web address] for contact info under        SignedManson Passey, PA  05/28/2020, 8:09 AM    ATTENDING ATTESTATION  I have seen, examined and evaluated the patient this AM along with Mr. Iver Nestle.  After reviewing all the available data and chart, we discussed the  patients laboratory, study & physical findings as well as symptoms in detail. I agree with his findings, examination as well as impression recommendations as per our discussion.    Attending adjustments noted in italics.   No further chest pain overnight.  He did indicate that his symptoms are similar to what he had during his last PCI.  He is hoping that he does not have 2 procedures done the same day today.  Mildly reduced EF but no active CHF symptoms at present.  Seems euvolemic on exam.  We will assess his EDP to determine if he requires diuretic.  Otherwise he is on ARB and beta-blocker.  Continue statin.  Apparently is not  currently on DAPT-was 1 year out as of February.  Was reportedly on Brilinta as of August of last year, but not currently listed on his MAR.  With extensive stent in the circumflex, however about possible in-stent thrombosis or stenosis.  Suspect that he will probably require longer term DAPT.   More to follow after cardiac cath today.   Bryan Lemma, M.D., M.S. Interventional Cardiologist   Pager # [phone] Phone # [phone] [address] [place], [place] [address]

## 2020-05-28 NOTE — Progress Notes (Signed)
  Echocardiogram 2D Echocardiogram has been performed.  Augustine Radar 05/28/2020, 11:59 AM

## 2020-05-28 NOTE — Telephone Encounter (Signed)
Per Chelsea Aus, TOC Visit set up for 06/12/20 at 2:15 pm with Edd Fabian.

## 2020-05-28 NOTE — Discharge Summary (Addendum)
Discharge Summary    Patient ID: Hunter Mcmillan MRN: [id number]; DOB: Jul 14, 1950  Admit date: 05/26/2020 Discharge date: 05/28/2020  PCP:  Pearline Cables, MD   [facility]  Cardiologist:  Peter Swaziland, MD    Discharge Diagnoses    Active Problems:   Unstable angina [facility])   Precordial chest pain Acute on chronic combined CHF CAD Hypertension Hyperlipidemia Thrombocytopenia Tobacco abuse   Diagnostic Studies/Procedures    Echo 05/28/20 1. Left ventricular ejection fraction, by estimation, is 30 to 35%. The  left ventricle has moderately decreased function. The left ventricle  demonstrates regional wall motion abnormalities with basal to mid inferior  and inferolateral akinesis. Left  ventricular diastolic parameters are consistent with Grade II diastolic  dysfunction (pseudonormalization).  2. Right ventricular systolic function is mildly reduced. The right  ventricular size is normal. Tricuspid regurgitation signal is inadequate  for assessing PA pressure.  3. The mitral valve is normal in structure. Trivial mitral valve  regurgitation. No evidence of mitral stenosis.  4. The aortic valve is tricuspid. Aortic valve regurgitation is not  visualized. Mild to moderate aortic valve sclerosis/calcification is  present, without any evidence of aortic stenosis.  5. The inferior vena cava is normal in size with <50% respiratory  variability, suggesting right atrial pressure of 8 mmHg.   LEFT HEART CATH AND CORONARY ANGIOGRAPHY    Conclusion    Ost LM lesion is 40% stenosed.  Mid LAD lesion is 30% stenosed.  Prox Cx lesion is 90% stenosed.  Prox RCA to Mid RCA lesion is 10% stenosed.   1. Large, dominant RCA with patent overlapping stents in the mid vessel with minimal restenosis 2. 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 The Circumflex is small in caliber and has an unchanged severe stenosis. Vessel is  too small for PCI 3. There is an eccentric, mild to moderate mid left main stenosis. This lesion appears to be eccentric and is unchanged from his last cath in 2021.   Recommendations: Continue medical management of CAD    History of Present Illness     Hunter Mcmillan is a 70 y.o. male with past medical history of coronary artery disease, hypertension, hyperlipidemia, prior CVA, alcohol abuse, tobacco abuse, COPD admitted with chest pain.   Patient presented with inferior myocardial infarction February 2021.  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  Patient had drug-eluting stent x2 and thrombectomy of the right coronary artery.  He had chest pain immediately following the procedure and relook catheterization revealed continued patency of the right coronary artery stents.  Echocardiogram during that admission showed ejection fraction 40 to 45%, mild left ventricular hypertrophy, grade 1 diastolic dysfunction, severe RV dysfunction, mild right ventricular enlargement, mild to moderate tricuspid regurgitation.  Had symptoms consistent with cholelithiasis.  He had ERCP with sphincterotomy (cholangiogram did not reveal stones).  Common bile duct was swept with good bile flow and liver enzymes improved.  Patient states that he typically does not have exertional chest pain but he does have dyspnea on exertion.  He denies orthopnea, PND, pedal edema or syncope.  3/24 morning at 5 AM he developed an aching pain in his substernal area without radiation.  No associated symptoms.  Pain is reminiscent of his myocardial infarction pain.  There was a question increased with inspiration.  He took 3 sublingual nitroglycerin  without improvement and therefore presented to the emergency room.  His pain has now resolved.   Hospital Course     Consultants: None  1.   Unstable angina -Mixed symptoms, concerning for [place]> EKG without acute ischemia.  Negative high-sensitivity troponin.  CT angio of chest without pulmonary embolism. He was treated with IV heparin.  Patient reported that he ran out of Brilinta last month.  Seems completed 12 months of dual antiplatelet therapy.  He also ran out of Coreg as well. -Cardiac catheterization showed stable anatomy. -Patient will continue aspirin and statin -Will restart Coreg and add losartan  2.  Acute on chronic combined CHF - LVEF of 40-45% in 2021.  LVEDP was 20 mmHg by cardiac catheterization. -Echo this admission showed LV function of 30 to 35% and grade 2 diastolic dysfunction.  Mildly reduced RV function. The left ventricle demonstrates regional wall motion abnormalities with basal to mid inferior and inferolateral akinesis.  No significant pulmonary abnormality -Patient restarted on Coreg 6.25 mg twice daily -Added losartan and low-dose Lasix -Recommended low-sodium diet and heart failure education given -Plan to titrate heart failure regimen as outpatient -May convert losartan to [facility] but cost may be an issue -Consider spironolactone  3. HLD - 05/27/2020: Cholesterol 71; HDL 30; LDL Cholesterol 34; Triglycerides 36; VLDL 7  - Continue statin and zetia   4. HTN - BP stable on current medications  5. Tobacco abuse - Recommended smoking cessation   6. Thrombocytopenia - Platelets improved    Did the patient have an acute coronary syndrome (MI, NSTEMI, STEMI, etc) this admission?:  No                               Did the patient have a percutaneous coronary intervention (stent / angioplasty)?:  No.       The patient been seen by Dr. Herbie Baltimore today and deemed ready for discharge home. All follow-up appointments have been scheduled. Discharge medications are listed below.  Discharge Vitals Blood pressure 127/70, pulse 72, temperature 98.5 F (36.9 C), temperature source Oral, resp. rate  18, height 5\' 11"  (1.803 m), weight 65.5 kg, SpO2 99 %.  Filed Weights   05/26/20 1248 05/27/20 0624 05/28/20 0519  Weight: 65.6 kg 67.3 kg 65.5 kg    Labs & Radiologic Studies    CBC Recent Labs    05/27/20 0339 05/28/20 0201  WBC 7.6 6.4  HGB 14.9 13.9  HCT 44.0 40.7  MCV 101.1* 100.5*  PLT 94* 101*   Basic Metabolic Panel Recent Labs    52/84/13 0339 05/28/20 0201  NA 134* 134*  K 4.2 3.6  CL 103 103  CO2 25 25  GLUCOSE 113* 115*  BUN 18 14  CREATININE 1.16 1.00  CALCIUM 8.8* 8.4*   Liver Function Tests Recent Labs    05/26/20 0729  AST 27  ALT 18  ALKPHOS 75  BILITOT 0.7  PROT 6.6  ALBUMIN 3.5   High Sensitivity Troponin:   Recent Labs  Lab 05/26/20 0715 05/26/20 0917 05/27/20 0822 05/27/20 0939  TROPONINIHS 23* 23* 17 17    BNP Invalid input(s): POCBNP D-Dimer Recent Labs    05/27/20 0822  DDIMER 1.56*   Fasting Lipid Panel Recent Labs    05/27/20 0339  CHOL 71  HDL 30*  LDLCALC 34  TRIG 36  CHOLHDL 2.4  _____________  DG Chest 2 View  Result Date: 05/26/2020 CLINICAL DATA:  Chest  pain EXAM: CHEST - 2 VIEW COMPARISON:  03/28/2020 FINDINGS: Lungs are clear.  No pleural effusion or pneumothorax. The heart is normal in size.  Thoracic aortic atherosclerosis. Degenerative changes of the visualized thoracolumbar spine. IMPRESSION: Normal chest radiographs. Electronically Signed   By: Charline Bills M.D.   On: 05/26/2020 07:54   CT ANGIO CHEST PE W OR WO CONTRAST  Result Date: 05/27/2020 CLINICAL DATA:  PE suspected EXAM: CT ANGIOGRAPHY CHEST WITH CONTRAST TECHNIQUE: Multidetector CT imaging of the chest was performed using the standard protocol during bolus administration of intravenous contrast. Multiplanar CT image reconstructions and MIPs were obtained to evaluate the vascular anatomy. CONTRAST:  65mL OMNIPAQUE IOHEXOL 350 MG/ML SOLN COMPARISON:  05/24/2020 FINDINGS: Cardiovascular: Satisfactory opacification of the pulmonary arteries  to the segmental level. No evidence of pulmonary embolism. Cardiomegaly. Left coronary artery calcifications. No pericardial effusion. Mediastinum/Nodes: No enlarged mediastinal, hilar, or axillary lymph nodes. Thyroid gland, trachea, and esophagus demonstrate no significant findings. Lungs/Pleura: Moderate centrilobular emphysema. Diffuse bilateral bronchial wall thickening. Trace bilateral pleural effusions and associated atelectasis or consolidation. Upper Abdomen: No acute abnormality. Musculoskeletal: No chest wall abnormality. No acute or significant osseous findings. Review of the MIP images confirms the above findings. IMPRESSION: 1. Negative examination for pulmonary embolism. 2. Trace bilateral pleural effusions and associated atelectasis or consolidation. 3. Emphysema. 4. Diffuse bilateral bronchial wall thickening, consistent with nonspecific infectious or inflammatory bronchitis. 5. Cardiomegaly and coronary artery disease. Emphysema (ICD10-J43.9). Electronically Signed   By: Lauralyn Primes M.D.   On: 05/27/2020 13:48   CARDIAC CATHETERIZATION  Result Date: 05/28/2020  Ost LM lesion is 40% stenosed.  Mid LAD lesion is 30% stenosed.  Prox Cx lesion is 90% stenosed.  Prox RCA to Mid RCA lesion is 10% stenosed.  1. Large, dominant RCA with patent overlapping stents in the mid vessel with minimal restenosis 2. 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 The Circumflex is small in caliber and has an unchanged severe stenosis. Vessel is too small for PCI 3. There is an eccentric, mild to moderate mid left main stenosis. This lesion appears to be eccentric and is unchanged from his last cath in 2021. Recommendations: Continue medical management of CAD   ECHOCARDIOGRAM COMPLETE  Result Date: 05/28/2020    ECHOCARDIOGRAM REPORT   Patient Name:   Hunter Mcmillan Date of Exam: 05/28/2020 Medical Rec #:  [id number]        Height:       71.0 in Accession #:    [id number]       Weight:        144.4 lb Date of Birth:  [birth year]        BSA:          1.836 m Patient Age:    70 years         BP:           108/75 mmHg Patient Gender: M                HR:           69 bpm. Exam Location:  Inpatient Procedure: 2D Echo, Cardiac Doppler and Color Doppler Indications:    R07.9* Chest pain, unspecified  History:        Patient has prior history of Echocardiogram examinations, most                 recent 04/24/2019. Previous Myocardial Infarction and CAD,  Stroke; Risk Factors:Current Smoker, Hypertension, Dyslipidemia                 and ETOH.  Sonographer:    Eulah Pont RDCS Referring Phys: [id number] Manson Passey IMPRESSIONS  1. Left ventricular ejection fraction, by estimation, is 30 to 35%. The left ventricle has moderately decreased function. The left ventricle demonstrates regional wall motion abnormalities with basal to mid inferior and inferolateral akinesis. Left ventricular diastolic parameters are consistent with Grade II diastolic dysfunction (pseudonormalization).  2. Right ventricular systolic function is mildly reduced. The right ventricular size is normal. Tricuspid regurgitation signal is inadequate for assessing PA pressure.  3. The mitral valve is normal in structure. Trivial mitral valve regurgitation. No evidence of mitral stenosis.  4. The aortic valve is tricuspid. Aortic valve regurgitation is not visualized. Mild to moderate aortic valve sclerosis/calcification is present, without any evidence of aortic stenosis.  5. The inferior vena cava is normal in size with <50% respiratory variability, suggesting right atrial pressure of 8 mmHg. FINDINGS  Left Ventricle: Left ventricular ejection fraction, by estimation, is 30 to 35%. The left ventricle has moderately decreased function. The left ventricle demonstrates regional wall motion abnormalities. The left ventricular internal cavity size was normal in size. There is no left ventricular hypertrophy. Left ventricular  diastolic parameters are consistent with Grade II diastolic dysfunction (pseudonormalization). Right Ventricle: The right ventricular size is normal. No increase in right ventricular wall thickness. Right ventricular systolic function is mildly reduced. Tricuspid regurgitation signal is inadequate for assessing PA pressure. Left Atrium: Left atrial size was normal in size. Right Atrium: Right atrial size was normal in size. Pericardium: There is no evidence of pericardial effusion. Mitral Valve: The mitral valve is normal in structure. Mild mitral annular calcification. Trivial mitral valve regurgitation. No evidence of mitral valve stenosis. Tricuspid Valve: The tricuspid valve is normal in structure. Tricuspid valve regurgitation is trivial. Aortic Valve: The aortic valve is tricuspid. Aortic valve regurgitation is not visualized. Mild to moderate aortic valve sclerosis/calcification is present, without any evidence of aortic stenosis. Pulmonic Valve: The pulmonic valve was normal in structure. Pulmonic valve regurgitation is not visualized. Aorta: The aortic root is normal in size and structure. Venous: The inferior vena cava is normal in size with less than 50% respiratory variability, suggesting right atrial pressure of 8 mmHg. IAS/Shunts: No atrial level shunt detected by color flow Doppler.  LEFT VENTRICLE PLAX 2D LVIDd:         5.30 cm      Diastology LVIDs:         4.20 cm      LV e' medial:    6.00 cm/s LV PW:         1.00 cm      LV E/e' medial:  14.4 LV IVS:        1.00 cm      LV e' lateral:   8.43 cm/s LVOT diam:     2.20 cm      LV E/e' lateral: 10.2 LV SV:         47 LV SV Index:   26 LVOT Area:     3.80 cm  LV Volumes (MOD) LV vol d, MOD A2C: 170.0 ml LV vol d, MOD A4C: 101.0 ml LV vol s, MOD A2C: 104.0 ml LV vol s, MOD A4C: 62.2 ml LV SV MOD A2C:     66.0 ml LV SV MOD A4C:     101.0 ml LV SV MOD BP:  52.2 ml RIGHT VENTRICLE RV S prime:     7.68 cm/s TAPSE (M-mode): 0.9 cm LEFT ATRIUM              Index       RIGHT ATRIUM           Index LA diam:        3.00 cm 1.63 cm/m  RA Area:     13.30 cm LA Vol (A2C):   39.7 ml 21.62 ml/m RA Volume:   31.10 ml  16.94 ml/m LA Vol (A4C):   33.1 ml 18.03 ml/m LA Biplane Vol: 35.9 ml 19.55 ml/m  AORTIC VALVE LVOT Vmax:   62.50 cm/s LVOT Vmean:  50.600 cm/s LVOT VTI:    0.124 m  AORTA Ao Root diam: 3.50 cm MITRAL VALVE MV Area (PHT): 3.74 cm    SHUNTS MV Decel Time: 203 msec    Systemic VTI:  0.12 m MV E velocity: 86.10 cm/s  Systemic Diam: 2.20 cm MV A velocity: 42.40 cm/s MV E/A ratio:  2.03 [name] MD Electronically signed by [name] MD Signature Date/Time: 05/28/2020/3:29:56 PM    Final    CT CHEST LUNG CA SCREEN LOW DOSE W/O CM  Result Date: 05/25/2020 CLINICAL DATA:  Fifty pack-year smoking history.  Current smoker. EXAM: CT CHEST WITHOUT CONTRAST LOW-DOSE FOR LUNG CANCER SCREENING TECHNIQUE: Multidetector CT imaging of the chest was performed following the standard protocol without IV contrast. COMPARISON:  03/28/2020 chest radiograph. 11/28/2016 CTA chest from [facility]. FINDINGS: Cardiovascular: Aortic atherosclerosis. Mild cardiomegaly, without pericardial effusion. Multivessel coronary artery atherosclerosis. Aortic valve calcification. Mediastinum/Nodes: Calcified right hilar nodes are likely related to old granulomatous disease. No mediastinal or definite hilar adenopathy, given limitations of unenhanced CT. Lungs/Pleura: No pleural fluid. Moderate centrilobular emphysema with left lower lobe scarring. Right lower lobe calcified granuloma. A right middle lobe probable area of scarring measures volume derived equivalent diameter 5.7 mm. Upper Abdomen: Normal imaged portions of the liver, stomach, pancreas, gallbladder, adrenal glands, left kidney. Old granulomatous disease in the spleen. Musculoskeletal: Advanced thoracic spondylosis with accentuation of expected kyphosis. IMPRESSION: 1. Lung-RADS 2, benign appearance or  behavior. Continue annual screening with low-dose chest CT without contrast in 12 months. 2. Aortic Atherosclerosis (ICD10-I70.0) and Emphysema (ICD10-J43.9). Coronary artery atherosclerosis. 3. Aortic valvular calcifications. Consider echocardiography to evaluate for valvular dysfunction. Electronically Signed   By: Jeronimo Greaves M.D.   On: 05/25/2020 10:40   Disposition   Pt is being discharged home today in good condition.  Follow-up Plans & Appointments     Follow-up Information    Ronney Asters, NP. Go on 06/12/2020.   Specialty: Cardiology Why: @2 :15pm for hospital follow up  Contact information: [address] [place] [phone]        [facility]. Go on 06/04/2020.   Specialty: Cardiology Why: between 7:30am-4pm for renal function check  Contact information: [address] [place] [phone]             Discharge Instructions    Diet - low sodium heart healthy   Complete by: As directed    Discharge instructions   Complete by: As directed    No driving for 3. No lifting over 5 lbs for 1 week. No sexual activity for 1 week. You may return to work on 06/04/20. Keep procedure site clean & dry. If you notice increased pain, swelling, bleeding or pus, call/return!  You may shower,  but no soaking baths/hot tubs/pools for 1 week.    Heart Failure Education: Weigh yourself EVERY morning after you go to the bathroom but before you eat or drink anything. Write this number down in a weight log/diary. If you gain 3 pounds overnight or 5 pounds in a week, call the office. Take your medicines as prescribed. If you have concerns about your medications, please call us before you stop taking them.  Eat low salt foods-Limit salt (sodium) to 2000 mg per day. This will help prevent your body from holding onto fluid. Read food labels as many processed foods have a lot of sodium, especially canned goods and  prepackaged meats. If you would like some assistance choosing low sodium foods, we would be happy to set you up with a nutritionist. Stay as active as you can everyday. Staying active will give you more energy and make your muscles stronger. Start with 5 minutes at a time and work your way up to 30 minutes a day. Break up your activities--do some in the morning and some in the afternoon. Start with 3 days per week and work your way up to 5 days as you can. If you have chest pain, feel short of breath, dizzy, or lightheaded, STOP. If you don't feel better after a short rest, call 911. If you do feel better, call the office to let us know you have symptoms with exercise. Limit all fluids for the day to less than 2 liters. Fluid includes all drinks, coffee, juice, ice chips, soup, jello, and all other liquids.   Increase activity slowly   Complete by: As directed       Discharge Medications   Allergies as of 05/28/2020   No Known Allergies     Medication List    STOP taking these medications   naproxen sodium 220 MG tablet Commonly known as: ALEVE   ticagrelor 90 MG Tabs tablet Commonly known as: BRILINTA     TAKE these medications   acetaminophen 500 MG tablet Commonly known as: TYLENOL Take 1 tablet (500 mg total) by mouth every 6 (six) hours as needed for mild pain, fever or headache.   albuterol 108 (90 Base) MCG/ACT inhaler Commonly known as: VENTOLIN HFA Inhale 2 puffs into the lungs every 6 (six) hours as needed for wheezing or shortness of breath.   aspirin EC 81 MG tablet Take 81 mg by mouth daily.   atorvastatin 80 MG tablet Commonly known as: LIPITOR Take 1 tablet (80 mg total) by mouth daily at 6 PM.   carvedilol 6.25 MG tablet Commonly known as: COREG Take 1 tablet (6.25 mg total) by mouth 2 (two) times daily.   diphenhydrAMINE 25 mg capsule Commonly known as: BENADRYL Take 25 mg by mouth as needed for allergies.   ezetimibe 10 MG tablet Commonly known as:  ZETIA Take 1 tablet (10 mg total) by mouth daily.   fluticasone 110 MCG/ACT inhaler Commonly known as: FLOVENT HFA Inhale 2 puffs into the lungs 2 (two) times daily.   furosemide 20 MG tablet Commonly known as: LASIX Take 1 tablet (20 mg total) by mouth daily.   losartan 25 MG tablet Commonly known as: COZAAR Take 1 tablet (25 mg total) by mouth daily.   multivitamin with minerals Tabs tablet Take 1 tablet by mouth daily.   nitroGLYCERIN 0.4 MG SL tablet Commonly known as: NITROSTAT Place 1 tablet (0.4 mg total) under the tongue every 5 (five) minutes as needed for chest pain.  tamsulosin 0.4 MG Caps capsule Commonly known as: FLOMAX Take 1 capsule (0.4 mg total) by mouth daily.          Outstanding Labs/Studies   BMP in one week   Duration of Discharge Encounter   Greater than 30 minutes including physician time.  SignedSharrell Ku Le[place], PA 05/28/2020, 4:15 PM   I saw and evaluated the patient today both before and after his heart catheterization.  He is doing much better now.  No further chest discomfort.  Cath results reviewed.  Echo also reviewed.  Very interesting that his pump function is down compared to recent echo.  I suspect this could be partly the reason for his symptoms.  He denies any PND orthopnea or edema.  He is back on carvedilol ARB and Lasix diuretic.  I do think Sherryll Burger will be a good idea in the future, but question if he would be out of afford it.  Also sending goes for SGLT2 inhibitor.  He indicated he would like to leave today, there is no clinical reason for him to stay other than his EF being lower.  I think this can be managed mostly in the outpatient setting.  Stable for discharge home after bedrest and TR band removal. Agree with DC plan   Bryan Lemma, MD

## 2020-05-28 NOTE — Plan of Care (Signed)
  Problem: Education: Goal: Knowledge of General Education information will improve Description: Including pain rating scale, medication(s)/side effects and non-pharmacologic comfort measures Outcome: Completed/Met   Problem: Health Behavior/Discharge Planning: Goal: Ability to manage health-related needs will improve Outcome: Completed/Met   Problem: Clinical Measurements: Goal: Ability to maintain clinical measurements within normal limits will improve Outcome: Completed/Met Goal: Will remain free from infection Outcome: Completed/Met Goal: Diagnostic test results will improve Outcome: Completed/Met Goal: Respiratory complications will improve Outcome: Completed/Met Goal: Cardiovascular complication will be avoided Outcome: Completed/Met   Problem: Activity: Goal: Risk for activity intolerance will decrease Outcome: Completed/Met   Problem: Nutrition: Goal: Adequate nutrition will be maintained Outcome: Completed/Met   Problem: Coping: Goal: Level of anxiety will decrease Outcome: Completed/Met   Problem: Elimination: Goal: Will not experience complications related to bowel motility Outcome: Completed/Met Goal: Will not experience complications related to urinary retention Outcome: Completed/Met   Problem: Pain Managment: Goal: General experience of comfort will improve Outcome: Completed/Met   Problem: Safety: Goal: Ability to remain free from injury will improve Outcome: Completed/Met   Problem: Skin Integrity: Goal: Risk for impaired skin integrity will decrease Outcome: Completed/Met   Problem: Education: Goal: Understanding of cardiac disease, CV risk reduction, and recovery process will improve Outcome: Completed/Met Goal: Individualized Educational Video(s) Outcome: Completed/Met   Problem: Activity: Goal: Ability to tolerate increased activity will improve Outcome: Completed/Met   Problem: Cardiac: Goal: Ability to achieve and maintain adequate  cardiovascular perfusion will improve Outcome: Completed/Met   Problem: Health Behavior/Discharge Planning: Goal: Ability to safely manage health-related needs after discharge will improve Outcome: Completed/Met   

## 2020-05-28 NOTE — Interval H&P Note (Signed)
History and Physical Interval Note:  05/28/2020 9:29 AM  Hunter Mcmillan  has presented today for surgery, with the diagnosis of unstable angina.  The various methods of treatment have been discussed with the patient and family. After consideration of risks, benefits and other options for treatment, the patient has consented to  Procedure(s): LEFT HEART CATH AND CORONARY ANGIOGRAPHY (N/A) as a surgical intervention.  The patient's history has been reviewed, patient examined, no change in status, stable for surgery.  I have reviewed the patient's chart and labs.  Questions were answered to the patient's satisfaction.    Cath Lab Visit (complete for each Cath Lab visit)  Clinical Evaluation Leading to the Procedure:   ACS: No.  Non-ACS:    Anginal Classification: CCS III  Anti-ischemic medical therapy: Minimal Therapy (1 class of medications)  Non-Invasive Test Results: No non-invasive testing performed  Prior CABG: No previous CABG        Verne Carrow

## 2020-05-28 NOTE — Progress Notes (Signed)
ANTICOAGULATION CONSULT NOTE - Follow Up Consult  Pharmacy Consult for Heparin Indication: chest pain/ACS  No Known Allergies  Patient Measurements: Height: 5\' 11"  (180.3 cm) Weight: 65.5 kg (144 lb 6.4 oz) IBW/kg (Calculated) : 75.3 kg Heparin Dosing Weight: 65.6 kg  Vital Signs: Temp: 97.6 F (36.4 C) (03/28 0519) Temp Source: Oral (03/28 0519) BP: 113/76 (03/28 0519) Pulse Rate: 70 (03/28 0519)  Labs: Recent Labs    05/26/20 0715 05/26/20 0729 05/26/20 0917 05/26/20 1919 05/27/20 0339 05/27/20 0822 05/27/20 0939 05/27/20 1224 05/28/20 0201  HGB 13.9  --   --   --  14.9  --   --   --  13.9  HCT 42.0  --   --   --  44.0  --   --   --  40.7  PLT 93*  --   --   --  94*  --   --   --  101*  HEPARINUNFRC  --   --   --    < > 0.15*  --   --  0.24* 0.26*  CREATININE  --  1.12  --   --  1.16  --   --   --  1.00  TROPONINIHS 23*  --  23*  --   --  17 17  --   --    < > = values in this interval not displayed.    Estimated Creatinine Clearance: 62.8 mL/min (by C-G formula based on SCr of 1 mg/dL).   Assessment: 70 yo male with a history of CAD s/p PCI of the R coronary artery presents with chest pain. The patient is planned for cardiac catheterization on 3/28. PTA the patient is not on anticoagulation. Pharmacy is consulted to dose heparin.  Heparin level is 0.26 at with heparin running at 1350 units/hr. CBC remains stable from yesterday overall, Hgb 13.9, platelets 101. Per the RN, there have been no issues with the infusion and the patient is without signs or symptoms of bleeding.   D-dimer elevateed at 1.56, CTA negative for PE.   Goal of Therapy:  Heparin level 0.3-0.7 units/ml Monitor platelets by anticoagulation protocol: Yes   Plan:  Increase heparin IV to 1450 units/hr Daily heparin level and CBC Planning cath this am  4/28 PharmD., BCPS Clinical Pharmacist 05/28/2020 7:30 AM   Please check [email or web address] for unit-specific pharmacy phone  numbers.

## 2020-06-11 NOTE — Progress Notes (Deleted)
Cardiology Clinic Note   Patient Name: Hunter Mcmillan Date of Encounter: 06/11/2020  Primary Care Provider:  [name], Mcmillan Primary Cardiologist:  Hunter Martinique, Mcmillan  Patient Profile    Hunter Mcmillan 70 year old male presents the clinic today for follow-up evaluation of his coronary artery disease status post cardiac catheterization 05/28/2020 with stable coronary anatomy medical management recommended.  Past Medical History    Past Medical History:  Diagnosis Date  . Abdominal pain 05/02/2019  . Alcoholism ([place])   . CAD (coronary artery disease)    a. 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 Chicken pox   . Depression   . ETOH abuse   . Genital warts   . Hepatic steatosis   . Hepatitis C    a. s/p [name] ~ 2003 in [place], [place] => stopped after 6 mos due to neutropenia - viral load noted to be low at that time;  no follow up since;   b. abdominal u/s 11/13:  diffuse hepatic steatosis and/or hepatocellular disease    . Hx of echocardiogram    a. Echo 01/06/12: Mild focal basal hypertrophy the septum, vigorous LV, EF 65-70%, Gr 2 diast dysfn, trivial AI, MAC, trivial MR, trivial TR, PASP 36  . Hyperlipidemia   . Hypertension   . Non-STEMI (non-ST elevated myocardial infarction) ([place])   . Stroke [facility])    "minor" pt reported  . Thrombocytopenia ([place])   . Tobacco abuse    Past Surgical History:  Procedure Laterality Date  . CORONARY ANGIOGRAPHY N/A 04/24/2019   Procedure: CORONARY ANGIOGRAPHY;  Surgeon: Mcmillan, Hunter Mcmillan, Mcmillan;  Location: [place] CV LAB;  Service: Cardiovascular;  Laterality: N/A;  . CORONARY/GRAFT ACUTE MI REVASCULARIZATION N/A 04/24/2019    Procedure: Coronary/Graft Acute MI Revascularization;  Surgeon: Mcmillan, Hunter Mcmillan, Mcmillan;  Location: [place] CV LAB;  Service: Cardiovascular;  Laterality: N/A;  . ERCP N/A 05/13/2019   Procedure: ENDOSCOPIC RETROGRADE CHOLANGIOPANCREATOGRAPHY (ERCP);  Surgeon: Hunter Juniper, Mcmillan;  Location: [place];  Service: Gastroenterology;  Laterality: N/A;  . EYE SURGERY  1954   corrective surgery for crossed eyes  . EYE SURGERY  1959  . LEFT HEART CATH AND CORONARY ANGIOGRAPHY N/A 04/24/2019   Procedure: LEFT HEART CATH AND CORONARY ANGIOGRAPHY;  Surgeon: Mcmillan, Hunter Mcmillan, Mcmillan;  Location: [place] CV LAB;  Service: Cardiovascular;  Laterality: N/A;  . LEFT HEART CATH AND CORONARY ANGIOGRAPHY N/A 05/28/2020   Procedure: LEFT HEART CATH AND CORONARY ANGIOGRAPHY;  Surgeon: Hunter Blanks, Mcmillan;  Location: [place] CV LAB;  Service: Cardiovascular;  Laterality: N/A;  . LEFT HEART CATHETERIZATION WITH CORONARY ANGIOGRAM N/A 01/05/2012   Procedure: LEFT HEART CATHETERIZATION WITH CORONARY ANGIOGRAM;  Surgeon: Hunter Dresser, Mcmillan;  Location: [facility] CATH LAB;  Service: Cardiovascular;  Laterality: N/A;  . REMOVAL OF STONES  05/13/2019   Procedure: REMOVAL OF STONES;  Surgeon: Hunter Juniper, Mcmillan;  Location: [facility] ENDOSCOPY;  Service: Gastroenterology;;  . Hunter Mcmillan  05/13/2019   Procedure: Hunter Mcmillan;  Surgeon: Hunter Juniper, Mcmillan;  Location: [facility] ENDOSCOPY;  Service: Gastroenterology;;    Allergies  No Known Allergies  History of Present Illness    Hunter Mcmillan  has a PMH of coronary artery disease, HTN, HLD, prior CVA, alcohol abuse, tobacco abuse, and COPD.  He had inferior MI 2/21.  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  He received DES x2 and thrombectomy of RCA.  He was noted to have chest pain immediately following the procedure and relook cardiac catheterization showed continued patency of  his RCA stents.  Echocardiogram during that admission showed an ejection fraction 40-45%, mild LVH, G1 DD, severe RV dysfunction, mild right ventricular enlargement, mild-moderate tricuspid regurgitation.  He was noted to have symptoms consistent with cholelithiasis.  He had ERCP with sphincterectomy (cholangiography did not reveal stones).  His common bile duct was swept.  Good bile flow was noted and his liver enzymes improved.  He was admitted with chest pain on 05/26/2020.  He reported increased dyspnea on exertion.  He denied orthopnea PND lower extremity edema and syncope.  In the morning at 5 AM on 05/24/2020 he developed aching pain that was substernal without radiation.  He felt this was reminiscent of his prior MI.  He took 3 sublingual nitroglycerin without improvement.  He underwent cardiac catheterization which showed stable coronary anatomy.  Medical management was recommended.  His carvedilol was restarted and losartan was added to his medication regimen.  He presents the clinic today for follow-up evaluation states***  *** denies chest pain, shortness of breath, lower extremity edema, fatigue, palpitations, melena, hematuria, hemoptysis, diaphoresis, weakness, presyncope, syncope, orthopnea, and PND.     Home Medications    Prior to Admission medications   Medication Sig Start Date End Date Taking? Authorizing Provider  acetaminophen (TYLENOL) 500 MG tablet Take 1 tablet (500 mg total) by mouth every 6 (six) hours as needed for mild pain, fever or headache. 05/13/19   Arrien, Hunter Picket, Mcmillan  albuterol (VENTOLIN HFA) 108 (90 Base) MCG/ACT inhaler Inhale 2 puffs into the lungs every 6 (six) hours as needed for wheezing or shortness of breath. 05/19/19   Copland, Hunter Mcmillan  aspirin EC 81 MG tablet Take 81 mg by mouth daily.    Provider, Historical, Mcmillan  atorvastatin (LIPITOR) 80 MG tablet Take 1 tablet (80 mg total) by mouth daily at 6 PM. 05/21/20   Copland, Hunter Mcmillan   carvedilol (COREG) 6.25 MG tablet Take 1 tablet (6.25 mg total) by mouth 2 (two) times daily. 05/28/20   Leanor Kail, PA  diphenhydrAMINE (BENADRYL) 25 mg capsule Take 25 mg by mouth as needed for allergies.    Provider, Historical, Mcmillan  ezetimibe (ZETIA) 10 MG tablet Take 1 tablet (10 mg total) by mouth daily. 05/21/20 08/19/20  Copland, Hunter Mcmillan  fluticasone (FLOVENT HFA) 110 MCG/ACT inhaler Inhale 2 puffs into the lungs 2 (two) times daily. Patient not taking: Reported on 05/26/2020 04/26/19 04/25/20  Leanor Kail, PA  furosemide (LASIX) 20 MG tablet Take 1 tablet (20 mg total) by mouth daily. 05/28/20   Bhagat, Crista Luria, PA  losartan (COZAAR) 25 MG tablet Take 1 tablet (25 mg total) by mouth daily. 05/28/20   Leanor Kail, PA  Multiple Vitamin (MULTIVITAMIN WITH MINERALS) TABS tablet Take 1 tablet by mouth daily.    Provider, Historical, Mcmillan  nitroGLYCERIN (NITROSTAT) 0.4 MG SL tablet Place 1 tablet (0.4 mg total) under the tongue every 5 (five) minutes as needed for chest pain. 05/21/20   Copland, Hunter Mcmillan  tamsulosin (FLOMAX) 0.4 MG CAPS capsule Take 1  capsule (0.4 mg total) by mouth daily. 05/21/20   Copland, Hunter Mcmillan    Family History    Family History  Problem Relation Age of Onset  . Heart attack Mother 44  . Diabetes Mother   . Heart disease Mother   . Hyperlipidemia Father   . Hypertension Father   . Diabetes Maternal Grandfather   . Heart disease Maternal Grandfather   . Early death Maternal Grandfather   . Heart attack Maternal Grandfather   . Hearing loss Sister   . Hyperlipidemia Sister   . Depression Brother   . Diabetes Brother   . Hyperlipidemia Brother   . Early death Maternal Grandmother   . Heart disease Maternal Grandmother   . Heart attack Maternal Grandmother   . Hearing loss Sister    He indicated that his mother is deceased. He indicated that his father is alive. He indicated that both of his sisters are alive. He indicated  that his brother is alive. He indicated that his maternal grandmother is deceased. He indicated that his maternal grandfather is deceased. He indicated that his paternal grandmother is deceased. He indicated that his paternal grandfather is deceased.  Social History    Social History   Socioeconomic History  . Marital status: Single    Spouse name: Not on file  . Number of children: 1  . Years of education: Not on file  . Highest education level: Bachelor's degree (e.g., BA, AB, BS)  Occupational History  . Occupation: Music therapist: [employer]    Comment: retired  Tobacco Use  . Smoking status: Current Every Day Smoker    Packs/day: 0.10    Types: Cigarettes  . Smokeless tobacco: Never Used  . Tobacco comment: almost quit, very rare, 1-2 daily  Vaping Use  . Vaping Use: Never used  Substance and Sexual Activity  . Alcohol use: Yes    Alcohol/week: 0.0 standard drinks  . Drug use: Not Currently    Types: Cocaine    Comment: history of cocaine last use 2014  . Sexual activity: Yes    Partners: Female    Comment: pt. declined condoms  Other Topics Concern  . Not on file  Social History Narrative  . Not on file   Social Determinants of Health   Financial Resource Strain: Not on file  Food Insecurity: Not on file  Transportation Needs: Not on file  Physical Activity: Not on file  Stress: Not on file  Social Connections: Not on file  Intimate Partner Violence: Not on file     Review of Systems    General:  No chills, fever, night sweats or weight changes.  Cardiovascular:  No chest pain, dyspnea on exertion, edema, orthopnea, palpitations, paroxysmal nocturnal dyspnea. Dermatological: No rash, lesions/masses Respiratory: No cough, dyspnea Urologic: No hematuria, dysuria Abdominal:   No nausea, vomiting, diarrhea, bright red blood per rectum, melena, or hematemesis Neurologic:  No visual changes, wkns, changes in mental status. All other systems  reviewed and are otherwise negative except as noted above.  Physical Exam    VS:  There were no vitals taken for this visit. , BMI There is no height or weight on file to calculate BMI. GEN: Well nourished, well developed, in no acute distress. HEENT: normal. Neck: Supple, no JVD, carotid bruits, or masses. Cardiac: RRR, no murmurs, rubs, or gallops. No clubbing, cyanosis, edema.  Radials/DP/PT 2+ and equal bilaterally.  Respiratory:  Respirations regular and unlabored, clear to auscultation bilaterally.  GI: Soft, nontender, nondistended, BS + x 4. MS: no deformity or atrophy. Skin: warm and dry, no rash. Neuro:  Strength and sensation are intact. Psych: Normal affect.  Accessory Clinical Findings    Recent Labs: 05/26/2020: ALT 18 05/28/2020: BUN 14; Creatinine, Ser 1.00; Hemoglobin 13.9; Platelets 101; Potassium 3.6; Sodium 134   Recent Lipid Panel    Component Value Date/Time   CHOL 71 05/27/2020 0339   CHOL 186 10/24/2019 1450   TRIG 36 05/27/2020 0339   HDL 30 (L) 05/27/2020 0339   HDL 48 10/24/2019 1450   CHOLHDL 2.4 05/27/2020 0339   VLDL 7 05/27/2020 0339   LDLCALC 34 05/27/2020 0339   LDLCALC 108 (H) 10/24/2019 1450    ECG personally reviewed by me today- *** - No acute changes  Echocardiogram 05/28/2020 IMPRESSIONS    1. Left ventricular ejection fraction, by estimation, is 30 to 35%. The  left ventricle has moderately decreased function. The left ventricle  demonstrates regional wall motion abnormalities with basal to mid inferior  and inferolateral akinesis. Left  ventricular diastolic parameters are consistent with Grade II diastolic  dysfunction (pseudonormalization).  2. Right ventricular systolic function is mildly reduced. The right  ventricular size is normal. Tricuspid regurgitation signal is inadequate  for assessing PA pressure.  3. The mitral valve is normal in structure. Trivial mitral valve  regurgitation. No evidence of mitral stenosis.   4. The aortic valve is tricuspid. Aortic valve regurgitation is not  visualized. Mild to moderate aortic valve sclerosis/calcification is  present, without any evidence of aortic stenosis.  5. The inferior vena cava is normal in size with <50% respiratory  variability, suggesting right atrial pressure of 8 mmHg.   Cardiac catheterization 05/28/2020  Ost LM lesion is 40% stenosed.  Mid LAD lesion is 30% stenosed.  Prox Cx lesion is 90% stenosed.  Prox RCA to Mid RCA lesion is 10% stenosed.   1. Large, dominant RCA with patent overlapping stents in the mid vessel with minimal restenosis 2. 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 The Circumflex is small in caliber and has an unchanged severe stenosis. Vessel is too small for PCI 3. There is an eccentric, mild to moderate mid left main stenosis. This lesion appears to be eccentric and is unchanged from his last cath in 2021.   Recommendations: Continue medical management of CAD  Diagnostic Dominance: Right    Intervention    Assessment & Plan   1.  Coronary artery disease-no chest pain today.  Underwent cardiac catheterization 05/28/2020 after a bout of chest pain where he took 3 sublingual nitroglycerin and did not have improvement in his symptoms.  Cardiac catheterization showed stable coronary anatomy.  Medical management was recommended. Continue carvedilol, losartan, aspirin, atorvastatin Heart healthy low-sodium diet-salty 6 given Increase physical activity as tolerated  Acute on chronic combined systolic and diastolic CHF-no increased DOE or activity intolerance today.  Echocardiogram 05/28/2020 showed LVEF 30-35% details above.  Losartan added to his medication regimen.  Plan to add spironolactone on follow-up with blood pressure allows. Continue carvedilol, losartan, furosemide Heart healthy low-sodium diet-salty 6 given Increase physical activity as tolerated  Essential hypertension-BP  today***.  Well-controlled at home. Continue carvedilol, losartan, furosemide Heart healthy low-sodium diet-salty 6 given Increase physical activity as tolerated  Hyperlipidemia-05/27/2020: Cholesterol 71; HDL 30; LDL Cholesterol 34; Triglycerides 36; VLDL 7 Continue atorvastatin, aspirin Heart healthy low-sodium high-fiber diet Increase physical activity as tolerated  Tobacco abuse-continues to smoke around half pack per  day.  Smoking cessation recommended Smoking cessation information provided  Disposition: Follow-up with Dr. Martinique in 6 months.  Jossie Ng. Dwon Sky NP-C    06/11/2020, [address] Office [phone] Fax [phone]  Notice: This dictation was prepared with Dragon dictation along with smaller phrase technology. Any transcriptional errors that result from this process are unintentional and may not be corrected upon review.  I spent***minutes examining this patient, reviewing medications, and using patient centered shared decision making involving her cardiac care.  Prior to her visit I spent greater than 20 minutes reviewing her past medical history,  medications, and prior cardiac tests.

## 2020-06-12 ENCOUNTER — Ambulatory Visit: Payer: Medicare PPO | Admitting: General Practice

## 2020-07-29 ENCOUNTER — Emergency Department (HOSPITAL_COMMUNITY)
Admission: EM | Admit: 2020-07-29 | Discharge: 2020-07-29 | Disposition: A | Discharge status: Home / Self Care | Payer: Medicare PPO | Attending: Emergency Medicine | Admitting: Emergency Medicine

## 2020-07-29 ENCOUNTER — Emergency Department (HOSPITAL_COMMUNITY): Payer: Medicare PPO

## 2020-07-29 ENCOUNTER — Encounter (HOSPITAL_COMMUNITY): Payer: Self-pay | Admitting: Emergency Medicine

## 2020-07-29 DIAGNOSIS — Z955 Presence of coronary angioplasty implant and graft: Secondary | ICD-10-CM | POA: Diagnosis not present

## 2020-07-29 DIAGNOSIS — R0781 Pleurodynia: Secondary | ICD-10-CM | POA: Diagnosis not present

## 2020-07-29 DIAGNOSIS — J449 Chronic obstructive pulmonary disease, unspecified: Secondary | ICD-10-CM | POA: Insufficient documentation

## 2020-07-29 DIAGNOSIS — I10 Essential (primary) hypertension: Secondary | ICD-10-CM | POA: Insufficient documentation

## 2020-07-29 DIAGNOSIS — R7303 Prediabetes: Secondary | ICD-10-CM | POA: Insufficient documentation

## 2020-07-29 DIAGNOSIS — F17210 Nicotine dependence, cigarettes, uncomplicated: Secondary | ICD-10-CM | POA: Diagnosis not present

## 2020-07-29 DIAGNOSIS — Z79899 Other long term (current) drug therapy: Secondary | ICD-10-CM | POA: Insufficient documentation

## 2020-07-29 DIAGNOSIS — R0902 Hypoxemia: Secondary | ICD-10-CM | POA: Diagnosis not present

## 2020-07-29 DIAGNOSIS — M47814 Spondylosis without myelopathy or radiculopathy, thoracic region: Secondary | ICD-10-CM | POA: Diagnosis not present

## 2020-07-29 DIAGNOSIS — Z0489 Encounter for examination and observation for other specified reasons: Secondary | ICD-10-CM | POA: Diagnosis not present

## 2020-07-29 DIAGNOSIS — Z7982 Long term (current) use of aspirin: Secondary | ICD-10-CM | POA: Diagnosis not present

## 2020-07-29 DIAGNOSIS — Z7952 Long term (current) use of systemic steroids: Secondary | ICD-10-CM | POA: Insufficient documentation

## 2020-07-29 DIAGNOSIS — M549 Dorsalgia, unspecified: Secondary | ICD-10-CM | POA: Diagnosis not present

## 2020-07-29 DIAGNOSIS — I2510 Atherosclerotic heart disease of native coronary artery without angina pectoris: Secondary | ICD-10-CM | POA: Insufficient documentation

## 2020-07-29 DIAGNOSIS — Z951 Presence of aortocoronary bypass graft: Secondary | ICD-10-CM | POA: Insufficient documentation

## 2020-07-29 DIAGNOSIS — I700 Atherosclerosis of aorta: Secondary | ICD-10-CM | POA: Diagnosis not present

## 2020-07-29 NOTE — Discharge Instructions (Signed)
Today your x-ray did not show any broken bones. Please use ice as needed for pain. Even though it hurts please make sure you are frequently taking full deep breaths.

## 2020-07-29 NOTE — ED Notes (Signed)
E-signature pad unavailable at time of pt discharge. This RN discussed discharge materials with pt and answered all pt questions. Pt stated understanding of discharge material. ? ?
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
## 2020-10-21 ENCOUNTER — Emergency Department (HOSPITAL_COMMUNITY): Payer: Medicare PPO

## 2020-10-21 ENCOUNTER — Encounter (HOSPITAL_COMMUNITY): Payer: Self-pay

## 2020-10-21 ENCOUNTER — Other Ambulatory Visit: Payer: Self-pay

## 2020-10-21 ENCOUNTER — Emergency Department (HOSPITAL_COMMUNITY)
Admission: EM | Admit: 2020-10-21 | Discharge: 2020-10-21 | Disposition: A | Discharge status: Home / Self Care | Payer: Medicare PPO | Attending: Emergency Medicine | Admitting: Emergency Medicine

## 2020-10-21 DIAGNOSIS — Z20822 Contact with and (suspected) exposure to covid-19: Secondary | ICD-10-CM | POA: Diagnosis not present

## 2020-10-21 DIAGNOSIS — Z7982 Long term (current) use of aspirin: Secondary | ICD-10-CM | POA: Diagnosis not present

## 2020-10-21 DIAGNOSIS — I10 Essential (primary) hypertension: Secondary | ICD-10-CM | POA: Diagnosis not present

## 2020-10-21 DIAGNOSIS — S52611A Displaced fracture of right ulna styloid process, initial encounter for closed fracture: Secondary | ICD-10-CM | POA: Diagnosis not present

## 2020-10-21 DIAGNOSIS — F17210 Nicotine dependence, cigarettes, uncomplicated: Secondary | ICD-10-CM | POA: Insufficient documentation

## 2020-10-21 DIAGNOSIS — S80212A Abrasion, left knee, initial encounter: Secondary | ICD-10-CM

## 2020-10-21 DIAGNOSIS — S52591A Other fractures of lower end of right radius, initial encounter for closed fracture: Secondary | ICD-10-CM | POA: Insufficient documentation

## 2020-10-21 DIAGNOSIS — W19XXXA Unspecified fall, initial encounter: Secondary | ICD-10-CM

## 2020-10-21 DIAGNOSIS — I25110 Atherosclerotic heart disease of native coronary artery with unstable angina pectoris: Secondary | ICD-10-CM | POA: Diagnosis not present

## 2020-10-21 DIAGNOSIS — I639 Cerebral infarction, unspecified: Secondary | ICD-10-CM | POA: Diagnosis not present

## 2020-10-21 DIAGNOSIS — S62101A Fracture of unspecified carpal bone, right wrist, initial encounter for closed fracture: Secondary | ICD-10-CM

## 2020-10-21 DIAGNOSIS — Y92410 Unspecified street and highway as the place of occurrence of the external cause: Secondary | ICD-10-CM | POA: Insufficient documentation

## 2020-10-21 DIAGNOSIS — S80211A Abrasion, right knee, initial encounter: Secondary | ICD-10-CM | POA: Diagnosis not present

## 2020-10-21 DIAGNOSIS — M4312 Spondylolisthesis, cervical region: Secondary | ICD-10-CM | POA: Diagnosis not present

## 2020-10-21 DIAGNOSIS — M25461 Effusion, right knee: Secondary | ICD-10-CM | POA: Diagnosis not present

## 2020-10-21 DIAGNOSIS — M7989 Other specified soft tissue disorders: Secondary | ICD-10-CM | POA: Diagnosis not present

## 2020-10-21 DIAGNOSIS — Z23 Encounter for immunization: Secondary | ICD-10-CM | POA: Insufficient documentation

## 2020-10-21 DIAGNOSIS — Z041 Encounter for examination and observation following transport accident: Secondary | ICD-10-CM | POA: Diagnosis not present

## 2020-10-21 DIAGNOSIS — J449 Chronic obstructive pulmonary disease, unspecified: Secondary | ICD-10-CM | POA: Insufficient documentation

## 2020-10-21 DIAGNOSIS — S6991XA Unspecified injury of right wrist, hand and finger(s), initial encounter: Secondary | ICD-10-CM | POA: Diagnosis not present

## 2020-10-21 DIAGNOSIS — Y900 Blood alcohol level of less than 20 mg/100 ml: Secondary | ICD-10-CM | POA: Insufficient documentation

## 2020-10-21 DIAGNOSIS — I6523 Occlusion and stenosis of bilateral carotid arteries: Secondary | ICD-10-CM | POA: Diagnosis not present

## 2020-10-21 DIAGNOSIS — S299XXA Unspecified injury of thorax, initial encounter: Secondary | ICD-10-CM | POA: Diagnosis not present

## 2020-10-21 DIAGNOSIS — I700 Atherosclerosis of aorta: Secondary | ICD-10-CM | POA: Diagnosis not present

## 2020-10-21 DIAGNOSIS — R0902 Hypoxemia: Secondary | ICD-10-CM | POA: Diagnosis not present

## 2020-10-21 DIAGNOSIS — M25531 Pain in right wrist: Secondary | ICD-10-CM | POA: Diagnosis not present

## 2020-10-21 DIAGNOSIS — Z79899 Other long term (current) drug therapy: Secondary | ICD-10-CM | POA: Diagnosis not present

## 2020-10-21 DIAGNOSIS — S6291XA Unspecified fracture of right wrist and hand, initial encounter for closed fracture: Secondary | ICD-10-CM | POA: Diagnosis not present

## 2020-10-21 LAB — BASIC METABOLIC PANEL
Anion gap: 6 (ref 5–15)
BUN: 12 mg/dL (ref 8–23)
CO2: 27 mmol/L (ref 22–32)
Calcium: 8.9 mg/dL (ref 8.9–10.3)
Chloride: 104 mmol/L (ref 98–111)
Creatinine, Ser: 1.09 mg/dL (ref 0.61–1.24)
GFR, Estimated: >60 mL/min (ref >60)
Glucose, Bld: 177 mg/dL — ABNORMAL HIGH (ref 70–99)
Potassium: 3.9 mmol/L (ref 3.5–5.1)
Sodium: 137 mmol/L (ref 135–145)

## 2020-10-21 LAB — RESP PANEL BY RT-PCR (FLU A&B, COVID) ARPGX2
Influenza A by PCR: NEGATIVE
Influenza B by PCR: NEGATIVE
SARS Coronavirus 2 by RT PCR: NEGATIVE

## 2020-10-21 LAB — CBC WITH DIFFERENTIAL/PLATELET
Abs Immature Granulocytes: 0.01 10*3/uL (ref 0.00–0.07)
Basophils Absolute: 0 10*3/uL (ref 0.0–0.1)
Basophils Relative: 1 %
Eosinophils Absolute: 0.1 10*3/uL (ref 0.0–0.5)
Eosinophils Relative: 2 %
HCT: 43 % (ref 39.0–52.0)
Hemoglobin: 14.2 g/dL (ref 13.0–17.0)
Immature Granulocytes: 0 %
Lymphocytes Relative: 25 %
Lymphs Abs: 1.2 10*3/uL (ref 0.7–4.0)
MCH: 34.4 pg — ABNORMAL HIGH (ref 26.0–34.0)
MCHC: 33 g/dL (ref 30.0–36.0)
MCV: 104.1 fL — ABNORMAL HIGH (ref 80.0–100.0)
Monocytes Absolute: 0.5 10*3/uL (ref 0.1–1.0)
Monocytes Relative: 10 %
Neutro Abs: 3.1 10*3/uL (ref 1.7–7.7)
Neutrophils Relative %: 62 %
Platelets: 130 10*3/uL — ABNORMAL LOW (ref 150–400)
RBC: 4.13 MIL/uL — ABNORMAL LOW (ref 4.22–5.81)
RDW: 13.9 % (ref 11.5–15.5)
WBC: 4.9 10*3/uL (ref 4.0–10.5)
nRBC: 0 % (ref 0.0–0.2)

## 2020-10-21 LAB — PROTIME-INR
INR: 1.1 (ref 0.8–1.2)
Prothrombin Time: 14.6 seconds (ref 11.4–15.2)

## 2020-10-21 LAB — ETHANOL: Alcohol, Ethyl (B): <10 mg/dL (ref <10)

## 2020-10-21 MED ORDER — CEFAZOLIN SODIUM-DEXTROSE 2-4 GM/100ML-% IV SOLN
2.0000 g | Freq: Once | INTRAVENOUS | Status: AC
  Administered 2020-10-21: 2 g via INTRAVENOUS
  Filled 2020-10-21: qty 100

## 2020-10-21 MED ORDER — HYDROCODONE-ACETAMINOPHEN 5-325 MG PO TABS: 1.0000 | ORAL_TABLET | Freq: Four times a day (QID) | ORAL | 0 refills | Status: DC | PRN

## 2020-10-21 MED ORDER — TETANUS-DIPHTH-ACELL PERTUSSIS 5-2.5-18.5 LF-MCG/0.5 IM SUSY
0.5000 mL | PREFILLED_SYRINGE | Freq: Once | INTRAMUSCULAR | Status: AC
  Administered 2020-10-21: 0.5 mL via INTRAMUSCULAR
  Filled 2020-10-21: qty 0.5

## 2020-10-21 MED ORDER — MORPHINE SULFATE (PF) 4 MG/ML IV SOLN
4.0000 mg | Freq: Once | INTRAVENOUS | Status: AC
  Administered 2020-10-21: 4 mg via INTRAVENOUS
  Filled 2020-10-21: qty 1

## 2020-10-21 NOTE — ED Notes (Signed)
Pt still in imaging

## 2020-10-21 NOTE — ED Notes (Signed)
Called lab to check on covid/flu swab status d/t noting it isn't in process at this time. Swab was sent to lab at 1755. Per lab they are receiving it in and running it now.

## 2020-10-21 NOTE — Progress Notes (Signed)
Orthopedic Tech Progress Note Patient Details:  Hunter Mcmillan June 11, 1950 [id number]   Ortho Devices Type of Ortho Device: Knee Immobilizer, Sugartong splint, Shoulder immobilizer Ortho Device/Splint Location: RUE, RLE Ortho Device/Splint Interventions: Ordered, Application, Adjustment   Post Interventions Patient Tolerated: Well Instructions Provided: Care of device, Adjustment of device  Hunter Mcmillan Hunter Mcmillan 10/21/2020, 8:04 PM

## 2020-10-21 NOTE — Discharge Instructions (Addendum)
Return for any problem.  Follow-up with Dr. Eulah Pont tomorrow.  Your wrist splint should remain in place until seen by Dr. Eulah Pont.  Use the knee immobilizer until follow-up with Dr. Eulah Pont as well.  Do not drink alcohol at the same time as you take a narcotic pain medication.

## 2020-10-21 NOTE — ED Notes (Signed)
Ortho tech at bedside applying wrist splint.
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
## 2020-10-21 NOTE — ED Notes (Signed)
Pt back from CT
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
## 2020-10-21 NOTE — ED Notes (Signed)
C-collar removed by EDP.   

## 2020-10-22 ENCOUNTER — Emergency Department (HOSPITAL_COMMUNITY)
Admission: EM | Admit: 2020-10-22 | Discharge: 2020-10-22 | Disposition: A | Discharge status: Home / Self Care | Payer: Medicare PPO | Attending: Emergency Medicine | Admitting: Emergency Medicine

## 2020-10-22 ENCOUNTER — Other Ambulatory Visit: Payer: Self-pay

## 2020-10-22 DIAGNOSIS — R609 Edema, unspecified: Secondary | ICD-10-CM | POA: Diagnosis not present

## 2020-10-22 DIAGNOSIS — I10 Essential (primary) hypertension: Secondary | ICD-10-CM | POA: Diagnosis not present

## 2020-10-22 DIAGNOSIS — W228XXD Striking against or struck by other objects, subsequent encounter: Secondary | ICD-10-CM | POA: Insufficient documentation

## 2020-10-22 DIAGNOSIS — M25539 Pain in unspecified wrist: Secondary | ICD-10-CM | POA: Diagnosis not present

## 2020-10-22 DIAGNOSIS — Z79899 Other long term (current) drug therapy: Secondary | ICD-10-CM | POA: Insufficient documentation

## 2020-10-22 DIAGNOSIS — I2510 Atherosclerotic heart disease of native coronary artery without angina pectoris: Secondary | ICD-10-CM | POA: Insufficient documentation

## 2020-10-22 DIAGNOSIS — Z20822 Contact with and (suspected) exposure to covid-19: Secondary | ICD-10-CM | POA: Diagnosis not present

## 2020-10-22 DIAGNOSIS — J449 Chronic obstructive pulmonary disease, unspecified: Secondary | ICD-10-CM | POA: Diagnosis not present

## 2020-10-22 DIAGNOSIS — Z7982 Long term (current) use of aspirin: Secondary | ICD-10-CM | POA: Diagnosis not present

## 2020-10-22 DIAGNOSIS — F17219 Nicotine dependence, cigarettes, with unspecified nicotine-induced disorders: Secondary | ICD-10-CM | POA: Diagnosis not present

## 2020-10-22 DIAGNOSIS — M25531 Pain in right wrist: Secondary | ICD-10-CM | POA: Diagnosis present

## 2020-10-22 DIAGNOSIS — S52501D Unspecified fracture of the lower end of right radius, subsequent encounter for closed fracture with routine healing: Secondary | ICD-10-CM | POA: Insufficient documentation

## 2020-10-22 DIAGNOSIS — S52501A Unspecified fracture of the lower end of right radius, initial encounter for closed fracture: Secondary | ICD-10-CM | POA: Diagnosis not present

## 2020-10-22 MED ORDER — HYDROCODONE-ACETAMINOPHEN 5-325 MG PO TABS
2.0000 | ORAL_TABLET | Freq: Once | ORAL | Status: AC
  Administered 2020-10-22: 2 via ORAL
  Filled 2020-10-22: qty 2

## 2020-10-22 NOTE — Discharge Instructions (Addendum)
Follow up as directed
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
## 2020-10-22 NOTE — Progress Notes (Signed)
Orthopedic Tech Progress Note Patient Details:  Kent Riendeau Mar 14, 1950 [id number]  Was told by PA to added extra padding to splint. So removed old fiberglass and added heavy padding then reapplied new sugartong  Ortho Devices Type of Ortho Device: Cotton web roll, Ace wrap, Short arm splint Ortho Device/Splint Location: RUE Ortho Device/Splint Interventions: Ordered, Application, Adjustment   Post Interventions Patient Tolerated: Well Instructions Provided: Care of device  Donald Pore 10/22/2020, 3:30 PM
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
## 2020-10-24 ENCOUNTER — Telehealth: Payer: Self-pay

## 2020-10-24 DIAGNOSIS — M25532 Pain in left wrist: Secondary | ICD-10-CM | POA: Diagnosis not present

## 2020-10-24 DIAGNOSIS — M25561 Pain in right knee: Secondary | ICD-10-CM | POA: Diagnosis not present

## 2020-10-24 NOTE — Telephone Encounter (Signed)
   [facility] Pre-operative Risk Assessment    Patient Name: Hunter Mcmillan  DOB: 1950-05-03 MRN: [id number]  HEARTCARE STAFF:  - IMPORTANT!!!!!! Under Visit Info/Reason for Call, type in Other and utilize the format Clearance MM/DD/YY or Clearance TBD. Do not use dashes or single digits. - Please review there is not already an duplicate clearance open for this procedure. - If request is for dental extraction, please clarify the # of teeth to be extracted. - If the patient is currently at the dentist's office, call Pre-Op Callback Staff (MA/nurse) to input urgent request.  - If the patient is not currently in the dentist office, please route to the Pre-Op pool.  Request for surgical clearance:  What type of surgery is being performed? ORIF Right Wrist When is this surgery scheduled? 10/30/2020  What type of clearance is required (medical clearance vs. Pharmacy clearance to hold med vs. Both)? medical  Are there any medications that need to be held prior to surgery and how long? none  Practice name and name of physician performing surgery? [facility]; Edmonia Lynch  What is the office phone number? [phone]    7.   What is the office fax number? [phone]  8.   Anesthesia type (None, local, MAC, general) ? unknown   Newt Minion 10/24/2020, 1:54 PM  _________________________________________________________________   (provider comments below)

## 2020-10-24 NOTE — Telephone Encounter (Signed)
   Name: Rayburn Mundis  DOB: [birth year]  MRN: [id number]  Primary Cardiologist: Peter [place], MD  Chart reviewed as part of pre-operative protocol coverage. Because of Aum Caggiano past medical history and time since last visit, he will require a follow-up visit in order to better assess preoperative cardiovascular risk.  This patient has not been seen in the OP setting since being admitted 05/2020 for chest pain at which time he underwent an LHC which showed stable CAD. He was to have an appointment with Edd Fabian, however was a no show to that appointment.   Pre-op covering staff: - Please schedule appointment and call patient to inform them. - Please contact requesting surgeon's office via preferred method (i.e, phone, fax) to inform them of need for appointment prior to surgery.  If applicable, this message will also be routed to pharmacy pool and/or primary cardiologist for input on holding anticoagulant/antiplatelet agent as requested below so that this information is available to the clearing provider at time of patient's appointment.   Georgie Chard, NP  10/24/2020, 2:21 PM

## 2020-10-24 NOTE — Telephone Encounter (Signed)
Attempted to reach pt to schedule appointment for surgical clearance. No answer. Vm was full so I could not leave a message.

## 2020-10-24 NOTE — Telephone Encounter (Signed)
Patient returned call to schedule pre-op appointment. I reached out to covering pre-op, but didn't hear back in time so I scheduled the patient for 12/10/20 with Joni Reining, the soonest appointment available.

## 2020-10-25 ENCOUNTER — Encounter: Payer: Self-pay | Admitting: Student

## 2020-10-25 ENCOUNTER — Other Ambulatory Visit: Payer: Self-pay

## 2020-10-25 ENCOUNTER — Ambulatory Visit (INDEPENDENT_AMBULATORY_CARE_PROVIDER_SITE_OTHER): Payer: Medicare PPO | Admitting: Student

## 2020-10-25 VITALS — BP 120/68 | HR 75 | Ht 71.0 in | Wt 143.6 lb

## 2020-10-25 DIAGNOSIS — I10 Essential (primary) hypertension: Secondary | ICD-10-CM | POA: Diagnosis not present

## 2020-10-25 DIAGNOSIS — I5042 Chronic combined systolic (congestive) and diastolic (congestive) heart failure: Secondary | ICD-10-CM | POA: Diagnosis not present

## 2020-10-25 DIAGNOSIS — I2510 Atherosclerotic heart disease of native coronary artery without angina pectoris: Secondary | ICD-10-CM

## 2020-10-25 DIAGNOSIS — Z720 Tobacco use: Secondary | ICD-10-CM

## 2020-10-25 DIAGNOSIS — Z01818 Encounter for other preprocedural examination: Secondary | ICD-10-CM

## 2020-10-25 DIAGNOSIS — E785 Hyperlipidemia, unspecified: Secondary | ICD-10-CM

## 2020-10-25 MED ORDER — SACUBITRIL-VALSARTAN 24-26 MG PO TABS: 1.0000 | ORAL_TABLET | Freq: Two times a day (BID) | ORAL | Status: DC

## 2020-10-25 NOTE — Patient Instructions (Signed)
Medication Instructions:  Stop Losartan. Start Entresto 24/26 mg ( 1 Tablet  Twice Daily. Starting on 10/26/2020) *If you need a refill on your cardiac medications before your next appointment, please call your pharmacy*   Lab Work: No Labs If you have labs (blood work) drawn today and your tests are completely normal, you will receive your results only by: MyChart Message (if you have MyChart) OR A paper copy in the mail If you have any lab test that is abnormal or we need to change your treatment, we will call you to review the results.   Testing/Procedures: No Testing    Follow-Up: At [facility], you and your health needs are our priority.  As part of our continuing mission to provide you with exceptional heart care, we have created designated Provider Care Teams.  These Care Teams include your primary Cardiologist (physician) and Advanced Practice Providers (APPs -  Physician Assistants and Nurse Practitioners) who all work together to provide you with the care you need, when you need it.  We recommend signing up for the patient portal called "MyChart".  Sign up information is provided on this After Visit Summary.  MyChart is used to connect with patients for Virtual Visits (Telemedicine).  Patients are able to view lab/test results, encounter notes, upcoming appointments, etc.  Non-urgent messages can be sent to your provider as well.   To learn more about what you can do with MyChart, go to [email or web address].    Your next appointment:   November 08, 2020 2:15 PM  The format for your next appointment:   In Person  Provider:   Marjie Skiff, PA-C

## 2020-10-25 NOTE — Telephone Encounter (Signed)
Looks like there is an appointment with Blima Rich 10/30/20 however this is the same day as his procedure. We may be able to take that appointment and only have him move the surgery out a day?

## 2020-10-25 NOTE — Telephone Encounter (Addendum)
Pt is scheduled to see Marjie Skiff, PA-C today for surgical clearance.
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
## 2020-10-25 NOTE — Progress Notes (Signed)
Left voice mail message with kelly high @ dr Eulah Pont office, pt not [facility] candidate due to ef 30 % per echo 05-28-2020
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
## 2020-10-26 NOTE — Patient Instructions (Signed)
DUE TO COVID-19 ONLY ONE VISITOR IS ALLOWED TO COME WITH YOU AND STAY IN THE WAITING ROOM ONLY DURING PRE OP AND PROCEDURE DAY OF SURGERY IF YOU ARE GOING HOME AFTER SURGERY. IF YOU ARE SPENDING THE NIGHT 2 PEOPLE MAY VISIT WITH YOU IN YOUR PRIVATE ROOM AFTER SURGERY UNTIL VISITING  HOURS ARE OVER AT 800 PM AND THE 2 VISITORS CANNOT SPEND THE NIGHT.                  Hunter Mcmillan     Your procedure is scheduled on: 8/30   Report to [facility] Main  Entrance   Report to admitting at  6:45 AM     Call this number if you have problems the morning of surgery [phone]    Remember: Do not eat food  :After Midnight the night before your surgery,     You may have clear liquids from midnight until 6:00 am.    CLEAR LIQUID DIET   Foods Allowed                                                                     Foods Excluded NO MILK OR CREAMER Coffee and tea, regular and decaf                             liquids that you cannot  Plain Jell-O any favor except red or purple                                           see through such as: Fruit ices (not with fruit pulp)                                     milk, soups, orange juice  Iced Popsicles                                    All solid food Carbonated beverages, regular and diet                                    Cranberry, grape and apple juices Sports drinks like Gatorade Lightly seasoned clear broth or consume(fat free) Sugar    BRUSH YOUR TEETH MORNING OF SURGERY AND RINSE YOUR MOUTH OUT, NO CHEWING GUM CANDY OR MINTS.     Take these medicines the morning of surgery with A SIP OF WATER USE YOUR INHALER AND BRING IT WITH YOU.   TAKE CARVEDILOL,:ENTRESTO.                                 You may not have any metal on your body including              piercings  Do not wear jewelry, lotions, powders ordeodorant  Men may shave face and neck.   Do not bring valuables to the hospital.  [facility] IS NOT             RESPONSIBLE   FOR VALUABLES.  Contacts, dentures or bridgework may not be worn into surgery.       Patients discharged the day of surgery will not be allowed to drive home.   IF YOU ARE HAVING SURGERY AND GOING HOME THE SAME DAY, YOU MUST HAVE AN ADULT TO DRIVE YOU HOME AND BE WITH YOU FOR 24 HOURS.  YOU MAY GO HOME BY TAXI OR UBER OR ORTHERWISE, BUT AN ADULT MUST ACCOMPANY YOU HOME AND STAY WITH YOU FOR 24 HOURS.  Name and phone number of your driver:  Special Instructions: N/A              Please read over the following fact sheets you were given: _____________________________________________________________________             [facility] - Preparing for Surgery Before surgery, you can play an important role.  Because skin is not sterile, your skin needs to be as free of germs as possible.  You can reduce the number of germs on your skin by washing with CHG (chlorahexidine gluconate) soap before surgery.  CHG is an antiseptic cleaner which kills germs and bonds with the skin to continue killing germs even after washing. Please DO NOT use if you have an allergy to CHG or antibacterial soaps.  If your skin becomes reddened/irritated stop using the CHG and inform your nurse when you arrive at Short Stay.   You may shave your face/neck. Please follow these instructions carefully:  1.  Shower with CHG Soap the night before surgery and the  morning of Surgery.  2.  If you choose to wash your hair, wash your hair first as usual with your  normal  shampoo.  3.  After you shampoo, rinse your hair and body thoroughly to remove the  shampoo.                            4.  Use CHG as you would any other liquid soap.  You can apply chg directly  to the skin and wash                       Gently with a scrungie or clean washcloth.  5.  Apply the CHG Soap to your body ONLY FROM THE NECK DOWN.   Do not use on face/ open                           Wound or open sores.  Avoid contact with eyes, ears mouth and genitals (private parts).                       Wash face,  Genitals (private parts) with your normal soap.             6.  Wash thoroughly, paying special attention to the area where your surgery  will be performed.  7.  Thoroughly rinse your body with warm water from the neck down.  8.  DO NOT shower/wash with your normal soap after using and rinsing off  the CHG Soap.                9.  Pat yourself dry  with a clean towel.            10.  Wear clean pajamas.            11.  Place clean sheets on your bed the night of your first shower and do not  sleep with pets. Day of Surgery : Do not apply any lotions/deodorants the morning of surgery.  Please wear clean clothes to the hospital/surgery center.  FAILURE TO FOLLOW THESE INSTRUCTIONS MAY RESULT IN THE CANCELLATION OF YOUR SURGERY PATIENT SIGNATURE_________________________________  NURSE SIGNATURE__________________________________  ________________________________________________________________________   Hunter Mcmillan  An incentive spirometer is a tool that can help keep your lungs clear and active. This tool measures how well you are filling your lungs with each breath. Taking long deep breaths may help reverse or decrease the chance of developing breathing (pulmonary) problems (especially infection) following: A long period of time when you are unable to move or be active. BEFORE THE PROCEDURE  If the spirometer includes an indicator to show your best effort, your nurse or respiratory therapist will set it to a desired goal. If possible, sit up straight or lean slightly forward. Try not to slouch. Hold the incentive spirometer in an upright position. INSTRUCTIONS FOR USE  Sit on the edge of your bed if possible, or sit up as far as you can in bed or on a chair. Hold the incentive spirometer in an upright position. Breathe out normally. Place the mouthpiece in your mouth and seal your  lips tightly around it. Breathe in slowly and as deeply as possible, raising the piston or the ball toward the top of the column. Hold your breath for 3-5 seconds or for as long as possible. Allow the piston or ball to fall to the bottom of the column. Remove the mouthpiece from your mouth and breathe out normally. Rest for a few seconds and repeat Steps 1 through 7 at least 10 times every 1-2 hours when you are awake. Take your time and take a few normal breaths between deep breaths. The spirometer may include an indicator to show your best effort. Use the indicator as a goal to work toward during each repetition. After each set of 10 deep breaths, practice coughing to be sure your lungs are clear. If you have an incision (the cut made at the time of surgery), support your incision when coughing by placing a pillow or rolled up towels firmly against it. Once you are able to get out of bed, walk around indoors and cough well. You may stop using the incentive spirometer when instructed by your caregiver.  RISKS AND COMPLICATIONS Take your time so you do not get dizzy or light-headed. If you are in pain, you may need to take or ask for pain medication before doing incentive spirometry. It is harder to take a deep breath if you are having pain. AFTER USE Rest and breathe slowly and easily. It can be helpful to keep track of a log of your progress. Your caregiver can provide you with a simple table to help with this. If you are using the spirometer at home, follow these instructions: SEEK MEDICAL CARE IF:  You are having difficultly using the spirometer. You have trouble using the spirometer as often as instructed. Your pain medication is not giving enough relief while using the spirometer. You develop fever of 100.5 F (38.1 C) or higher. SEEK IMMEDIATE MEDICAL CARE IF:  You cough up bloody sputum that had not been present before. You develop  fever of 102 F (38.9 C) or greater. You develop  worsening pain at or near the incision site. MAKE SURE YOU:  Understand these instructions. Will watch your condition. Will get help right away if you are not doing well or get worse. Document Released: 06/30/2006 Document Revised: 05/12/2011 Document Reviewed: 08/31/2006 [facility] Patient Information 2014 [place], [place].   ________________________________________________________________________

## 2020-10-29 ENCOUNTER — Other Ambulatory Visit: Payer: Self-pay

## 2020-10-29 ENCOUNTER — Encounter (HOSPITAL_COMMUNITY)
Admission: RE | Admit: 2020-10-29 | Discharge: 2020-10-29 | Disposition: A | Discharge status: Home / Self Care | Payer: Medicare PPO | Source: Ambulatory Visit | Attending: Orthopedic Surgery | Admitting: Orthopedic Surgery

## 2020-10-29 ENCOUNTER — Encounter (HOSPITAL_COMMUNITY): Payer: Self-pay

## 2020-10-29 ENCOUNTER — Encounter (HOSPITAL_COMMUNITY): Payer: Self-pay | Admitting: Orthopedic Surgery

## 2020-10-29 NOTE — Progress Notes (Signed)
COVID Test Completed:  NA  PCP - Dr. Shela Commons. Copland LOV 05/21/20 Cardiologist - Dr. Demetrius Charity. [place] LOV 10/25/20. Clearance 8/24.22  Chest x-ray - 10/21/20-epic EKG - 10/25/20-epic Stress Test - no ECHO - 05/28/20-epic Cardiac Cath - 2013, 2012, 05/28/20 Pacemaker/ICD device last checked:NA  Sleep Study - no CPAP -   Fasting Blood Sugar - NA Checks Blood Sugar _____ times a day  Blood Thinner Instructions:ASA 81/ Dr. [place] Aspirin Instructions:Stop 7 days prior to DOS/ Dr. Eulah Pont Last Dose:10/25/20  Anesthesia review: yes  Patient denies shortness of breath, fever, cough and chest pain at PAT appointment Yes Pt gets SOB after 3 flights of stairs, not doing house work or with ASDLs  Patient verbalized understanding of instructions that were given to them at the PAT appointment. Patient was also instructed that they will need to review over the PAT instructions again at home before surgery. PAT visit done by Phone instructions read to Pt and he indicated understanding. He smokes 1-2 per day and drinks 1-2 per week. He states he doesn't have depression anymore and doesn't use cocaine.
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
## 2020-10-29 NOTE — Anesthesia Preprocedure Evaluation (Addendum)
Anesthesia Evaluation  Patient identified by MRN, date of birth, ID band Patient awake    Reviewed: Allergy & Precautions, NPO status , Patient's Chart, lab work & pertinent test results  Airway Mallampati: II  TM Distance: >3 FB Neck ROM: Full    Dental  (+) Missing   Pulmonary COPD,  COPD inhaler, Current Smoker and Patient abstained from smoking.,    Pulmonary exam normal breath sounds clear to auscultation       Cardiovascular hypertension, Pt. on home beta blockers + angina + CAD, + Past MI, + Cardiac Stents and +CHF  Normal cardiovascular exam Rhythm:Regular Rate:Normal  ECG: NSR, rate 75  ECHO: Left ventricular ejection fraction, by estimation, is 30 to 35%. The left ventricle has moderately decreased function. The left ventricle demonstrates regional wall motion abnormalities with basal to mid inferior and inferolateral akinesis. Left ventricular diastolic parameters are consistent with Grade II diastolic dysfunction (pseudonormalization). Right ventricular systolic function is mildly reduced. The right ventricular size is normal. Tricuspid regurgitation signal is inadequate for assessing PA pressure. The mitral valve is normal in structure. Trivial mitral valve regurgitation. No evidence of mitral stenosis. The aortic valve is tricuspid. Aortic valve regurgitation is not visualized. Mild to moderate aortic valve sclerosis/calcification is present, without any evidence of aortic stenosis. The inferior vena cava is normal in size with <50% respiratory variability, suggesting right atrial pressure of 8 mmHg.   Neuro/Psych PSYCHIATRIC DISORDERS Depression CVA, No Residual Symptoms    GI/Hepatic negative GI ROS, (+)     substance abuse  , Hepatitis - (treated), C  Endo/Other  negative endocrine ROS  Renal/GU negative Renal ROS     Musculoskeletal negative musculoskeletal ROS (+)   Abdominal   Peds  Hematology HLD    Anesthesia Other Findings RIGHT WRIST FRACTURE  Reproductive/Obstetrics                          Anesthesia Physical Anesthesia Plan  ASA: 4  Anesthesia Plan: General and Regional   Post-op Pain Management: GA combined w/ Regional for post-op pain   Induction: Intravenous  PONV Risk Score and Plan: 1 and Ondansetron, Dexamethasone, Midazolam and Treatment may vary due to age or medical condition  Airway Management Planned: LMA  Additional Equipment:   Intra-op Plan:   Post-operative Plan: Extubation in OR  Informed Consent: I have reviewed the patients History and Physical, chart, labs and discussed the procedure including the risks, benefits and alternatives for the proposed anesthesia with the patient or authorized representative who has indicated his/her understanding and acceptance.     Dental advisory given  Plan Discussed with: CRNA  Anesthesia Plan Comments: (Reviewed PAT note 10/29/2020, Jodell Cipro Ward, PA-C Motor and sensation intact to right hand)      Anesthesia Quick Evaluation

## 2020-10-30 ENCOUNTER — Ambulatory Visit (HOSPITAL_COMMUNITY): Payer: Medicare PPO | Admitting: Certified Registered Nurse Anesthetist

## 2020-10-30 ENCOUNTER — Ambulatory Visit (HOSPITAL_COMMUNITY)
Admission: RE | Admit: 2020-10-30 | Discharge: 2020-10-30 | Disposition: A | Discharge status: Home / Self Care | Payer: Medicare PPO | Attending: Orthopedic Surgery | Admitting: Orthopedic Surgery

## 2020-10-30 ENCOUNTER — Encounter (HOSPITAL_COMMUNITY)
Admission: RE | Disposition: A | Discharge status: Home / Self Care | Payer: Self-pay | Source: Home / Self Care | Attending: Orthopedic Surgery

## 2020-10-30 ENCOUNTER — Ambulatory Visit: Payer: Medicare PPO | Admitting: Medical

## 2020-10-30 ENCOUNTER — Encounter (HOSPITAL_COMMUNITY): Payer: Self-pay | Admitting: Orthopedic Surgery

## 2020-10-30 DIAGNOSIS — I10 Essential (primary) hypertension: Secondary | ICD-10-CM | POA: Diagnosis not present

## 2020-10-30 DIAGNOSIS — Z7982 Long term (current) use of aspirin: Secondary | ICD-10-CM | POA: Diagnosis not present

## 2020-10-30 DIAGNOSIS — Z79899 Other long term (current) drug therapy: Secondary | ICD-10-CM | POA: Diagnosis not present

## 2020-10-30 DIAGNOSIS — S52611A Displaced fracture of right ulna styloid process, initial encounter for closed fracture: Secondary | ICD-10-CM | POA: Insufficient documentation

## 2020-10-30 DIAGNOSIS — S52591A Other fractures of lower end of right radius, initial encounter for closed fracture: Secondary | ICD-10-CM | POA: Insufficient documentation

## 2020-10-30 DIAGNOSIS — G8918 Other acute postprocedural pain: Secondary | ICD-10-CM | POA: Diagnosis not present

## 2020-10-30 DIAGNOSIS — S62101A Fracture of unspecified carpal bone, right wrist, initial encounter for closed fracture: Secondary | ICD-10-CM | POA: Diagnosis not present

## 2020-10-30 DIAGNOSIS — I252 Old myocardial infarction: Secondary | ICD-10-CM | POA: Diagnosis not present

## 2020-10-30 DIAGNOSIS — G5601 Carpal tunnel syndrome, right upper limb: Secondary | ICD-10-CM | POA: Diagnosis not present

## 2020-10-30 DIAGNOSIS — F17210 Nicotine dependence, cigarettes, uncomplicated: Secondary | ICD-10-CM | POA: Diagnosis not present

## 2020-10-30 DIAGNOSIS — I25110 Atherosclerotic heart disease of native coronary artery with unstable angina pectoris: Secondary | ICD-10-CM | POA: Diagnosis not present

## 2020-10-30 DIAGNOSIS — S52501A Unspecified fracture of the lower end of right radius, initial encounter for closed fracture: Secondary | ICD-10-CM | POA: Diagnosis not present

## 2020-10-30 HISTORY — PX: CARPAL TUNNEL RELEASE: SHX101

## 2020-10-30 HISTORY — PX: ORIF WRIST FRACTURE: SHX2133

## 2020-10-30 SURGERY — OPEN REDUCTION INTERNAL FIXATION (ORIF) WRIST FRACTURE
Anesthesia: Regional | Site: Wrist | Laterality: Right

## 2020-10-30 MED ORDER — LIDOCAINE HCL (CARDIAC) PF 100 MG/5ML IV SOSY
PREFILLED_SYRINGE | INTRAVENOUS | Status: DC | PRN
  Administered 2020-10-30: 40 mg via INTRAVENOUS

## 2020-10-30 MED ORDER — VASOPRESSIN 20 UNIT/ML IV SOLN
INTRAVENOUS | Status: AC
  Filled 2020-10-30: qty 1

## 2020-10-30 MED ORDER — LIDOCAINE 2% (20 MG/ML) 5 ML SYRINGE
INTRAMUSCULAR | Status: AC
  Filled 2020-10-30: qty 5

## 2020-10-30 MED ORDER — FENTANYL CITRATE PF 50 MCG/ML IJ SOSY: 25.0000 ug | PREFILLED_SYRINGE | INTRAMUSCULAR | Status: DC | PRN

## 2020-10-30 MED ORDER — PHENYLEPHRINE HCL-NACL 20-0.9 MG/250ML-% IV SOLN
INTRAVENOUS | Status: DC | PRN
Start: 2020-10-30 — End: 2020-10-30
  Administered 2020-10-30: 85 ug/min via INTRAVENOUS

## 2020-10-30 MED ORDER — FENTANYL CITRATE PF 50 MCG/ML IJ SOSY
50.0000 ug | PREFILLED_SYRINGE | Freq: Once | INTRAMUSCULAR | Status: DC
  Filled 2020-10-30: qty 2

## 2020-10-30 MED ORDER — EPHEDRINE SULFATE 50 MG/ML IJ SOLN
INTRAMUSCULAR | Status: DC | PRN
  Administered 2020-10-30 (×2): 5 mg via INTRAVENOUS
  Administered 2020-10-30 (×4): 10 mg via INTRAVENOUS

## 2020-10-30 MED ORDER — SUCCINYLCHOLINE CHLORIDE 200 MG/10ML IV SOSY
PREFILLED_SYRINGE | INTRAVENOUS | Status: AC
  Filled 2020-10-30: qty 10

## 2020-10-30 MED ORDER — VASOPRESSIN 20 UNIT/ML IV SOLN
INTRAVENOUS | Status: DC | PRN
  Administered 2020-10-30 (×6): 1 [IU] via INTRAVENOUS

## 2020-10-30 MED ORDER — EPHEDRINE 5 MG/ML INJ
INTRAVENOUS | Status: AC
  Filled 2020-10-30: qty 5

## 2020-10-30 MED ORDER — DEXAMETHASONE SODIUM PHOSPHATE 10 MG/ML IJ SOLN: 8.0000 mg | Freq: Once | INTRAMUSCULAR | Status: DC

## 2020-10-30 MED ORDER — POVIDONE-IODINE 10 % EX SWAB
2.0000 "application " | Freq: Once | CUTANEOUS | Status: AC
  Administered 2020-10-30: 2 via TOPICAL

## 2020-10-30 MED ORDER — ACETAMINOPHEN 500 MG PO TABS
1000.0000 mg | ORAL_TABLET | Freq: Once | ORAL | Status: AC
  Administered 2020-10-30: 1000 mg via ORAL
  Filled 2020-10-30: qty 2

## 2020-10-30 MED ORDER — AMISULPRIDE (ANTIEMETIC) 5 MG/2ML IV SOLN: 10.0000 mg | Freq: Once | INTRAVENOUS | Status: DC | PRN

## 2020-10-30 MED ORDER — MIDAZOLAM HCL 2 MG/2ML IJ SOLN
1.0000 mg | Freq: Once | INTRAMUSCULAR | Status: AC
  Administered 2020-10-30: 2 mg via INTRAVENOUS
  Filled 2020-10-30: qty 2

## 2020-10-30 MED ORDER — ONDANSETRON HCL 4 MG/2ML IJ SOLN
INTRAMUSCULAR | Status: DC | PRN
  Administered 2020-10-30: 4 mg via INTRAVENOUS

## 2020-10-30 MED ORDER — CEFAZOLIN SODIUM-DEXTROSE 2-4 GM/100ML-% IV SOLN
2.0000 g | INTRAVENOUS | Status: AC
  Administered 2020-10-30: 2 g via INTRAVENOUS
  Filled 2020-10-30: qty 100

## 2020-10-30 MED ORDER — MELOXICAM 15 MG PO TABS: 15.0000 mg | ORAL_TABLET | Freq: Every day | ORAL | 0 refills | Status: DC | PRN

## 2020-10-30 MED ORDER — ONDANSETRON HCL 4 MG/2ML IJ SOLN: 4.0000 mg | Freq: Once | INTRAMUSCULAR | Status: DC | PRN

## 2020-10-30 MED ORDER — HYDROCODONE-ACETAMINOPHEN 10-325 MG PO TABS: 1.0000 | ORAL_TABLET | Freq: Four times a day (QID) | ORAL | 0 refills | Status: DC | PRN

## 2020-10-30 MED ORDER — FENTANYL CITRATE (PF) 100 MCG/2ML IJ SOLN
INTRAMUSCULAR | Status: AC
  Filled 2020-10-30: qty 2

## 2020-10-30 MED ORDER — ROCURONIUM BROMIDE 10 MG/ML (PF) SYRINGE
PREFILLED_SYRINGE | INTRAVENOUS | Status: AC
  Filled 2020-10-30: qty 10

## 2020-10-30 MED ORDER — 0.9 % SODIUM CHLORIDE (POUR BTL) OPTIME
TOPICAL | Status: DC | PRN
  Administered 2020-10-30: 1000 mL

## 2020-10-30 MED ORDER — LACTATED RINGERS IV SOLN: INTRAVENOUS | Status: DC

## 2020-10-30 MED ORDER — BUPIVACAINE-EPINEPHRINE (PF) 0.5% -1:200000 IJ SOLN
INTRAMUSCULAR | Status: DC | PRN
  Administered 2020-10-30: 30 mL via PERINEURAL

## 2020-10-30 MED ORDER — ONDANSETRON 4 MG PO TBDP: 4.0000 mg | ORAL_TABLET | Freq: Two times a day (BID) | ORAL | 0 refills | Status: DC | PRN

## 2020-10-30 MED ORDER — PROPOFOL 10 MG/ML IV BOLUS
INTRAVENOUS | Status: DC | PRN
  Administered 2020-10-30: 200 mg via INTRAVENOUS

## 2020-10-30 MED ORDER — CHLORHEXIDINE GLUCONATE 0.12 % MT SOLN
15.0000 mL | Freq: Once | OROMUCOSAL | Status: AC
  Administered 2020-10-30: 15 mL via OROMUCOSAL

## 2020-10-30 MED ORDER — CARVEDILOL 6.25 MG PO TABS
6.2500 mg | ORAL_TABLET | Freq: Once | ORAL | Status: AC
  Administered 2020-10-30: 6.25 mg via ORAL
  Filled 2020-10-30: qty 1

## 2020-10-30 MED ORDER — ORAL CARE MOUTH RINSE: 15.0000 mL | Freq: Once | OROMUCOSAL | Status: AC

## 2020-10-30 MED ORDER — FENTANYL CITRATE (PF) 100 MCG/2ML IJ SOLN
INTRAMUSCULAR | Status: DC | PRN
  Administered 2020-10-30 (×2): 25 ug via INTRAVENOUS

## 2020-10-30 MED ORDER — DEXAMETHASONE SODIUM PHOSPHATE 4 MG/ML IJ SOLN
INTRAMUSCULAR | Status: DC | PRN
  Administered 2020-10-30: 6 mg via INTRAVENOUS

## 2020-10-30 SURGICAL SUPPLY — 49 items
BAG COUNTER SPONGE SURGICOUNT (BAG) ×2 IMPLANT
BAG ZIPLOCK 12X15 (MISCELLANEOUS) IMPLANT
BIT DRILL 2.2 SS TIBIAL (BIT) ×2 IMPLANT
BNDG ELASTIC 4X5.8 VLCR STR LF (GAUZE/BANDAGES/DRESSINGS) ×2 IMPLANT
BNDG ESMARK 4X9 LF (GAUZE/BANDAGES/DRESSINGS) ×2 IMPLANT
CLSR STERI-STRIP ANTIMIC 1/2X4 (GAUZE/BANDAGES/DRESSINGS) ×2 IMPLANT
CORD BIPOLAR FORCEPS 12FT (ELECTRODE) ×2 IMPLANT
COVER SURGICAL LIGHT HANDLE (MISCELLANEOUS) ×2 IMPLANT
CUFF TOURN SGL QUICK 18X4 (TOURNIQUET CUFF) ×2 IMPLANT
DRAPE OEC MINIVIEW 54X84 (DRAPES) ×2 IMPLANT
DRAPE U-SHAPE 47X51 STRL (DRAPES) ×2 IMPLANT
DRSG PAD ABDOMINAL 8X10 ST (GAUZE/BANDAGES/DRESSINGS) ×2 IMPLANT
ELECT REM PT RETURN 15FT ADLT (MISCELLANEOUS) ×2 IMPLANT
GAUZE SPONGE 4X4 12PLY STRL (GAUZE/BANDAGES/DRESSINGS) ×2 IMPLANT
GLOVE SRG 8 PF TXTR STRL LF DI (GLOVE) ×1 IMPLANT
GLOVE SURG ENC MOIS LTX SZ7.5 (GLOVE) ×2 IMPLANT
GLOVE SURG POLYISO LF SZ7.5 (GLOVE) ×2 IMPLANT
GLOVE SURG UNDER POLY LF SZ7.5 (GLOVE) ×2 IMPLANT
GLOVE SURG UNDER POLY LF SZ8 (GLOVE) ×1
GOWN STRL REUS W/TWL LRG LVL3 (GOWN DISPOSABLE) ×2 IMPLANT
GOWN STRL REUS W/TWL XL LVL3 (GOWN DISPOSABLE) ×4 IMPLANT
K-WIRE 1.6 (WIRE) ×2
K-WIRE FX5X1.6XNS BN SS (WIRE) ×2
KIT BASIN OR (CUSTOM PROCEDURE TRAY) ×2 IMPLANT
KWIRE FX5X1.6XNS BN SS (WIRE) ×2 IMPLANT
MANIFOLD NEPTUNE II (INSTRUMENTS) ×2 IMPLANT
PACK ORTHO EXTREMITY (CUSTOM PROCEDURE TRAY) ×2 IMPLANT
PAD CAST 4YDX4 CTTN HI CHSV (CAST SUPPLIES) ×2 IMPLANT
PADDING CAST COTTON 2X4 NS (CAST SUPPLIES) ×2 IMPLANT
PADDING CAST COTTON 4X4 STRL (CAST SUPPLIES) ×2
PEG LOCKING SMOOTH 2.2X18 (Peg) ×2 IMPLANT
PEG LOCKING SMOOTH 2.2X20 (Screw) ×8 IMPLANT
PEG LOCKING SMOOTH 2.2X22 (Screw) ×4 IMPLANT
PLATE LONG DVR RIGHT (Plate) ×2 IMPLANT
PROTECTOR NERVE ULNAR (MISCELLANEOUS) IMPLANT
SCREW  LP NL 2.7X16MM (Screw) ×1 IMPLANT
SCREW LP NL 2.7X16MM (Screw) ×1 IMPLANT
SCREW NONLOCK 2.7X18MM (Screw) ×8 IMPLANT
SLING ARM IMMOBILIZER MED (SOFTGOODS) ×2 IMPLANT
SPLINT PLASTER CAST XFAST 4X15 (CAST SUPPLIES) ×2 IMPLANT
SPLINT PLASTER XTRA FAST SET 4 (CAST SUPPLIES) ×2
SUCTION FRAZIER HANDLE 10FR (MISCELLANEOUS) ×1
SUCTION TUBE FRAZIER 10FR DISP (MISCELLANEOUS) ×1 IMPLANT
SUT MNCRL AB 4-0 PS2 18 (SUTURE) ×2 IMPLANT
SUT VIC AB 2-0 CT1 27 (SUTURE) ×1
SUT VIC AB 2-0 CT1 TAPERPNT 27 (SUTURE) ×1 IMPLANT
TOWEL OR 17X26 10 PK STRL BLUE (TOWEL DISPOSABLE) ×2 IMPLANT
TUBE CONNECTING 20X1/4 (TUBING) ×2 IMPLANT
UNDERPAD 30X36 HEAVY ABSORB (UNDERPADS AND DIAPERS) ×2 IMPLANT

## 2020-10-30 NOTE — Anesthesia Procedure Notes (Signed)
Procedure Name: LMA Insertion Date/Time: 10/30/2020 9:54 AM Performed by: Theodosia Quay, CRNA Pre-anesthesia Checklist: Patient identified, Emergency Drugs available, Suction available and Patient being monitored Patient Re-evaluated:Patient Re-evaluated prior to induction Oxygen Delivery Method: Circle System Utilized Preoxygenation: Pre-oxygenation with 100% oxygen Induction Type: IV induction Ventilation: Mask ventilation without difficulty LMA: LMA inserted LMA Size: 4.0 Number of attempts: 1 Airway Equipment and Method: Bite block Placement Confirmation: positive ETCO2 Tube secured with: Tape Dental Injury: Teeth and Oropharynx as per pre-operative assessment  Comments: Teeth in very poor condition, patient states all are secure, easy pass of LMA, teeth as in preop.

## 2020-10-30 NOTE — Discharge Instructions (Addendum)

## 2020-10-30 NOTE — Progress Notes (Signed)
Assisted Dr. Ellender with right, ultrasound guided, supraclavicular block. Side rails up, monitors on throughout procedure. See vital signs in flow sheet. Tolerated Procedure well. 

## 2020-10-30 NOTE — Interval H&P Note (Signed)
History and Physical Interval Note:  10/30/2020 8:10 AM  Hunter Mcmillan  has presented today for surgery, with the diagnosis of RIGHT WRIST FRACTURE.  The various methods of treatment have been discussed with the patient and family. After consideration of risks, benefits and other options for treatment, the patient has consented to  Procedure(s): OPEN REDUCTION INTERNAL FIXATION (ORIF) WRIST FRACTURE, POSSIBLE OPEN CARPAL TUNNEL RELEASE (Right) possible, CARPAL TUNNEL RELEASE (Right) as a surgical intervention.  The patient's history has been reviewed, patient examined, no change in status, stable for surgery.  I have reviewed the patient's chart and labs.  Questions were answered to the patient's satisfaction.     Sheral Apley

## 2020-10-30 NOTE — Anesthesia Procedure Notes (Signed)
Anesthesia Regional Block: Supraclavicular block   Pre-Anesthetic Checklist: , timeout performed,  Correct Patient, Correct Site, Correct Laterality,  Correct Procedure, Correct Position, site marked,  Risks and benefits discussed,  Surgical consent,  Pre-op evaluation,  At surgeon's request and post-op pain management  Laterality: Right  Prep: chloraprep       Needles:  Injection technique: Single-shot  Needle Type: Echogenic Stimulator Needle     Needle Length: 9cm  Needle Gauge: 21     Additional Needles:   Procedures:,,,, ultrasound used (permanent image in chart),,    Narrative:  Start time: 10/30/2020 8:10 AM End time: 10/30/2020 8:20 AM Injection made incrementally with aspirations every 5 mL.  Performed by: Personally  Anesthesiologist: Leonides Grills, MD  Additional Notes: Functioning IV was confirmed and monitors were applied.  A timeout was performed. Sterile prep, hand hygiene and sterile gloves were used. A 23mm 21ga Arrow echogenic stimulator needle was used. Negative aspiration and negative test dose prior to incremental administration of local anesthetic. The patient tolerated the procedure well.  Ultrasound guidance: relevent anatomy identified, needle position confirmed, local anesthetic spread visualized around nerve(s), vascular puncture avoided.  Image printed for medical record.

## 2020-10-30 NOTE — Transfer of Care (Signed)
Immediate Anesthesia Transfer of Care Note  Patient: Hunter Mcmillan  Procedure(s) Performed: Procedure(s): OPEN REDUCTION INTERNAL FIXATION (ORIF) WRIST FRACTURE (Right) CARPAL TUNNEL RELEASE (Right)  Patient Location: PACU  Anesthesia Type:GA combined with regional for post-op pain  Level of Consciousness:  sedated, patient cooperative and responds to stimulation  Airway & Oxygen Therapy:Patient Spontanous Breathing and Patient connected to face mask oxgen  Post-op Assessment:  Report given to PACU RN and Post -op Vital signs reviewed and stable  Post vital signs:  Reviewed and stable  Last Vitals:  Vitals:   10/30/20 0809 10/30/20 0821  BP: (!) 144/74 123/75  Pulse: 67 77  Resp: 19 18  Temp:    SpO2: 100% 100%    Complications: No apparent anesthesia complications

## 2020-10-30 NOTE — Progress Notes (Signed)
Per Dr. Porfirio Oar request,  , sling and splint were removed by tech and RN. Pt comfortable with pillow under arm for support.

## 2020-10-30 NOTE — Progress Notes (Signed)
Patient discharged from PACU at 1235. Patient VSS. IV removed. Went over discharge instructions with patient. All questions answered. Waiting on response from friend Rosey Bath to come come get patient.

## 2020-10-30 NOTE — Anesthesia Postprocedure Evaluation (Signed)
Anesthesia Post Note  Patient: Hunter Mcmillan  Procedure(s) Performed: OPEN REDUCTION INTERNAL FIXATION (ORIF) WRIST FRACTURE (Right: Wrist) CARPAL TUNNEL RELEASE (Right: Wrist)     Patient location during evaluation: PACU Anesthesia Type: Regional and General Level of consciousness: awake Pain management: pain level controlled Vital Signs Assessment: post-procedure vital signs reviewed and stable Respiratory status: spontaneous breathing, nonlabored ventilation, respiratory function stable and patient connected to nasal cannula oxygen Cardiovascular status: blood pressure returned to baseline and stable Postop Assessment: no apparent nausea or vomiting Anesthetic complications: no   No notable events documented.  Last Vitals:  Vitals:   10/30/20 1200 10/30/20 1230  BP: 107/63 115/66  Pulse: 62 63  Resp: 16 12  Temp: 36.4 C 36.4 C  SpO2: 96% 94%    Last Pain:  Vitals:   10/30/20 1230  TempSrc:   PainSc: 2                  Corayma Cashatt P Jeliyah Middlebrooks

## 2020-10-30 NOTE — Op Note (Addendum)
10/30/2020  10:36 AM  PATIENT:  Hunter Mcmillan    PRE-OPERATIVE DIAGNOSIS:  RIGHT WRIST FRACTURE  POST-OPERATIVE DIAGNOSIS:  Same  PROCEDURE:  OPEN REDUCTION INTERNAL FIXATION (ORIF) WRIST FRACTURE, CARPAL TUNNEL RELEASE  SURGEON:  Hunter Apley, MD  ASSISTANT: Hunter Fresh, PA-C, he was present and scrubbed throughout the case, critical for completion in a timely fashion, and for retraction, instrumentation, and closure.   ANESTHESIA:   gen  PREOPERATIVE INDICATIONS:  Hunter Mcmillan is a  70 y.o. male with a diagnosis of RIGHT WRIST FRACTURE who failed conservative measures and elected for surgical management.    The risks benefits and alternatives were discussed with the patient preoperatively including but not limited to the risks of infection, bleeding, nerve injury, cardiopulmonary complications, the need for revision surgery, among others, and the patient was willing to proceed.  OPERATIVE IMPLANTS: DVR plate  OPERATIVE FINDINGS: comminuted fx  BLOOD LOSS: min  COMPLICATIONS: none  TOURNIQUET TIME: 45  OPERATIVE PROCEDURE:  Patient was identified in the preoperative holding area and site was marked by me He was transported to the operating theater and placed on the table in supine position taking care to pad all bony prominences. After a preincinduction time out anesthesia was induced. The right upper extremity was prepped and draped in normal sterile fashion and a pre-incision timeout was performed. He received ancef for preoperative antibiotics.   I made a 8 cm incision centered over her FCR tendon and dissected down carefully to the level of the flexor tendon sheath and incise this longitudinally and retracted the FCR radially and incised the dorsal aspect of the sheath.   I bluntly dissected the FPL muscle belly away from the brachioradialis and then sharply incised the pronator tendon from the distal radius and from the wrist capsule. I Elevated this off the bone  the fractures visible.   I released the brachioradialis from its insertion. I then debrided the fracture and performed a manual reduction. There were at least 3 articular pieces of the fracture.  I selected a plate and I placed it on the bone. I pinned it into place and was happy on multiple radiographic views with it's placement. I then fixed the plate distally with the locking pegs. I confirmed no articular penetration with the pegs and that none were prominent dorsally.   I then reduced the plate to the shaft improving the volar and radial tilt of her distal radius.  I was happy with the final fluoro xrays. I reviewed more than 3 views of the wrist including obliques and ap/lat  I performed an exposure of the proximal carpal tunnel and released this whiile protecting the median nerve. This carpal tunnel release was performed via the distal extension of the radial exposure. I kept a Hunter Mcmillan over the median nerve to protect it througout this portion of the procedure.  I protected the NV structures while performing this carpal tunnel release with a blunt tip metz scissors. I was happy tiwht the release and this decreased the pressure on the median nerve.    I thoroughly irrigated the wound and closed the pronator over top of the plate and then closed the skin in layers with absorbable stitch. Sterile dressing was applied using the PACU in stable condition.  POST OPERATIVE PLAN: NWB, Splint full time. Ambulate for DVT px.

## 2020-10-30 NOTE — Progress Notes (Signed)
Patient friend Hunter Mcmillan responded to calls. Came to pick up patient at 1830.  Patient was discharged from PACU at 1235.  Notified Meghan,PA about discharge.

## 2020-11-01 ENCOUNTER — Encounter (HOSPITAL_COMMUNITY): Payer: Self-pay | Admitting: Orthopedic Surgery
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
## 2020-11-09 ENCOUNTER — Ambulatory Visit: Payer: Medicare PPO | Admitting: Student

## 2020-11-09 DIAGNOSIS — G5601 Carpal tunnel syndrome, right upper limb: Secondary | ICD-10-CM | POA: Diagnosis not present
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
## 2020-12-10 ENCOUNTER — Ambulatory Visit: Payer: Medicare PPO | Admitting: Adult Health

## 2020-12-10 ENCOUNTER — Encounter (HOSPITAL_COMMUNITY): Payer: Self-pay

## 2020-12-10 ENCOUNTER — Emergency Department (HOSPITAL_COMMUNITY): Payer: Medicare PPO

## 2020-12-10 ENCOUNTER — Other Ambulatory Visit: Payer: Self-pay

## 2020-12-10 ENCOUNTER — Emergency Department (HOSPITAL_COMMUNITY)
Admission: EM | Admit: 2020-12-10 | Discharge: 2020-12-10 | Disposition: A | Discharge status: Home / Self Care | Payer: Medicare PPO | Attending: Emergency Medicine | Admitting: Emergency Medicine

## 2020-12-10 DIAGNOSIS — J180 Bronchopneumonia, unspecified organism: Secondary | ICD-10-CM | POA: Diagnosis not present

## 2020-12-10 DIAGNOSIS — Z7982 Long term (current) use of aspirin: Secondary | ICD-10-CM | POA: Diagnosis not present

## 2020-12-10 DIAGNOSIS — Z20822 Contact with and (suspected) exposure to covid-19: Secondary | ICD-10-CM | POA: Diagnosis not present

## 2020-12-10 DIAGNOSIS — F17210 Nicotine dependence, cigarettes, uncomplicated: Secondary | ICD-10-CM | POA: Insufficient documentation

## 2020-12-10 DIAGNOSIS — R0602 Shortness of breath: Secondary | ICD-10-CM | POA: Diagnosis not present

## 2020-12-10 DIAGNOSIS — I10 Essential (primary) hypertension: Secondary | ICD-10-CM | POA: Insufficient documentation

## 2020-12-10 DIAGNOSIS — I2510 Atherosclerotic heart disease of native coronary artery without angina pectoris: Secondary | ICD-10-CM | POA: Diagnosis not present

## 2020-12-10 DIAGNOSIS — J90 Pleural effusion, not elsewhere classified: Secondary | ICD-10-CM | POA: Diagnosis not present

## 2020-12-10 DIAGNOSIS — Z951 Presence of aortocoronary bypass graft: Secondary | ICD-10-CM | POA: Insufficient documentation

## 2020-12-10 DIAGNOSIS — R000 Tachycardia, unspecified: Secondary | ICD-10-CM | POA: Insufficient documentation

## 2020-12-10 DIAGNOSIS — J449 Chronic obstructive pulmonary disease, unspecified: Secondary | ICD-10-CM | POA: Diagnosis not present

## 2020-12-10 DIAGNOSIS — R059 Cough, unspecified: Secondary | ICD-10-CM | POA: Diagnosis not present

## 2020-12-10 HISTORY — DX: Emphysema, unspecified: J43.9

## 2020-12-10 HISTORY — DX: Chronic obstructive pulmonary disease, unspecified: J44.9

## 2020-12-10 LAB — BASIC METABOLIC PANEL
Anion gap: 8 (ref 5–15)
BUN: 12 mg/dL (ref 8–23)
CO2: 27 mmol/L (ref 22–32)
Calcium: 8.9 mg/dL (ref 8.9–10.3)
Chloride: 96 mmol/L — ABNORMAL LOW (ref 98–111)
Creatinine, Ser: 0.9 mg/dL (ref 0.61–1.24)
GFR, Estimated: >60 mL/min (ref >60)
Glucose, Bld: 102 mg/dL — ABNORMAL HIGH (ref 70–99)
Potassium: 4 mmol/L (ref 3.5–5.1)
Sodium: 131 mmol/L — ABNORMAL LOW (ref 135–145)

## 2020-12-10 LAB — RESP PANEL BY RT-PCR (FLU A&B, COVID) ARPGX2
Influenza A by PCR: NEGATIVE
Influenza B by PCR: NEGATIVE
SARS Coronavirus 2 by RT PCR: NEGATIVE

## 2020-12-10 LAB — CBC
HCT: 42.1 % (ref 39.0–52.0)
Hemoglobin: 14.4 g/dL (ref 13.0–17.0)
MCH: 35 pg — ABNORMAL HIGH (ref 26.0–34.0)
MCHC: 34.2 g/dL (ref 30.0–36.0)
MCV: 102.2 fL — ABNORMAL HIGH (ref 80.0–100.0)
Platelets: 293 10*3/uL (ref 150–400)
RBC: 4.12 MIL/uL — ABNORMAL LOW (ref 4.22–5.81)
RDW: 13.9 % (ref 11.5–15.5)
WBC: 8.7 10*3/uL (ref 4.0–10.5)
nRBC: 0 % (ref 0.0–0.2)

## 2020-12-10 MED ORDER — DOXYCYCLINE HYCLATE 100 MG PO CAPS: 100.0000 mg | ORAL_CAPSULE | Freq: Two times a day (BID) | ORAL | 0 refills | Status: DC

## 2020-12-10 MED ORDER — ALBUTEROL SULFATE HFA 108 (90 BASE) MCG/ACT IN AERS: 2.0000 | INHALATION_SPRAY | RESPIRATORY_TRACT | Status: DC | PRN

## 2020-12-10 MED ORDER — PREDNISONE 10 MG PO TABS: 20.0000 mg | ORAL_TABLET | Freq: Every day | ORAL | 0 refills | Status: AC

## 2020-12-10 MED ORDER — ALBUTEROL SULFATE HFA 108 (90 BASE) MCG/ACT IN AERS
4.0000 | INHALATION_SPRAY | Freq: Once | RESPIRATORY_TRACT | Status: AC
  Administered 2020-12-10: 4 via RESPIRATORY_TRACT
  Filled 2020-12-10: qty 6.7

## 2020-12-10 MED ORDER — DOXYCYCLINE HYCLATE 100 MG PO TABS
100.0000 mg | ORAL_TABLET | Freq: Once | ORAL | Status: AC
  Administered 2020-12-10: 100 mg via ORAL
  Filled 2020-12-10: qty 1

## 2020-12-10 NOTE — ED Notes (Signed)
Reita Cliche roommate would like an update [phone]

## 2020-12-10 NOTE — ED Provider Notes (Signed)
[facility] DEPT Provider Note  CSN: [id number] Arrival date & time: 12/10/20 2021  Chief Complaint(s) Cough and Shortness of Breath  HPI Hunter Mcmillan is a 70 y.o. male    Cough Cough characteristics:  Productive Sputum characteristics:  Clear Severity:  Moderate Onset quality:  Gradual Duration:  2 weeks Timing:  Constant Progression:  Worsening Chronicity:  Recurrent Relieved by:  Nothing Worsened by:  Deep breathing Associated symptoms: chest pain (only with coughing) and shortness of breath   Associated symptoms: no chills, no fever, no headaches, no myalgias, no rhinorrhea and no wheezing   Shortness of Breath Severity:  Mild Worsened by:  Coughing Associated symptoms: chest pain (only with coughing) and cough   Associated symptoms: no fever, no headaches and no wheezing    Past Medical History Past Medical History:  Diagnosis Date   Abdominal pain 05/02/2019   Alcoholism ([place])    CAD (coronary artery disease)    a. 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 s/p [name] ~ 2003 in [place], [place] => stopped after 6 mos due to neutropenia - viral load noted to be low at that time;  no follow up since;   b. abdominal u/s 11/13:  diffuse hepatic steatosis and/or hepatocellular disease     Hx of echocardiogram    a. Echo 01/06/12: Mild focal basal hypertrophy the septum, vigorous LV, EF 65-70%, Gr 2 diast dysfn, trivial AI, MAC, trivial MR, trivial TR, PASP 36    Hyperlipidemia    Hypertension    Non-STEMI (non-ST elevated myocardial infarction) ([place]) 2019   Stroke ([place]) 2019   "minor" pt reported   Thrombocytopenia ([place])    Tobacco abuse    Patient Active Problem List   Diagnosis Date Noted   Precordial chest pain    Unstable angina ([place]) 05/26/2020   Prediabetes 05/22/2020   Acute cholecystitis 05/09/2019   Cholelithiasis 04/30/2019   RUQ abdominal pain    Presence of drug-eluting stent in right coronary artery 04/25/2019   Hyperlipidemia with target LDL less than 70 04/25/2019   Acute inferior STEMI 04/25/2019   STEMI involving right coronary artery ([place]) 04/24/2019   Abdominal aortic aneurysm (AAA) 3.0 cm to 5.5 cm in diameter in male 01/06/2017   Essential hypertension 04/09/2015   Hyperpigmentation of skin 10/05/2014   COPD (chronic obstructive pulmonary disease) ([place]) 06/26/2014   Sacroiliac joint dysfunction of right side 06/26/2014   Alcoholism ([place])    Cocaine abuse ([place]) 11/18/2013   Scrotal mass 07/21/2013   Cirrhosis ([place]) 05/28/2013   Routine general medical examination at a health care facility 07/19/2012   Abnormal TSH 04/09/2012   Coronary artery disease involving native heart with unstable angina pectoris ([place]) 01/14/2012   Hx of non-ST elevation myocardial infarction (NSTEMI) 01/05/2012   Tobacco abuse 01/05/2012   Chronic hepatitis C without hepatic coma ([place]) 01/05/2012   Depression, recurrent ([place]) 01/05/2012  Thrombocytopenia ([place]) 01/05/2012   Home Medication(s) Prior to Admission medications   Medication Sig Start Date End Date Taking? Authorizing Provider  doxycycline (VIBRAMYCIN) 100 MG capsule Take 1 capsule (100 mg total) by mouth 2 (two) times daily. 12/10/20  Yes Solomia Harrell, Grayce Sessions, MD  predniSONE (DELTASONE) 10 MG tablet Take 2 tablets (20 mg total) by mouth daily for 5 days. 12/10/20 12/15/20 Yes Izaias Krupka, Grayce Sessions, MD  acetaminophen (TYLENOL) 500 MG tablet Take 1 tablet (500 mg total) by  mouth every 6 (six) hours as needed for mild pain, fever or headache. 05/13/19   Arrien, Jimmy Picket, MD  albuterol (VENTOLIN HFA) 108 (90 Base) MCG/ACT inhaler Inhale 2 puffs into the lungs every 6 (six) hours as needed for wheezing or shortness of breath. 05/19/19   Copland, Gay Filler, MD  aspirin EC 81 MG tablet Take 81 mg by mouth daily at 6 PM.    [provider]  atorvastatin (LIPITOR) 80 MG tablet Take 1 tablet (80 mg total) by mouth daily at 6 PM. 05/21/20   Copland, Gay Filler, MD  carvedilol (COREG) 6.25 MG tablet Take 1 tablet (6.25 mg total) by mouth 2 (two) times daily. 05/28/20   Leanor Kail, PA  diphenhydrAMINE (BENADRYL) 25 mg capsule Take 25 mg by mouth as needed for allergies.    [provider]  ezetimibe (ZETIA) 10 MG tablet Take 1 tablet (10 mg total) by mouth daily. Patient taking differently: Take 10 mg by mouth every evening. 05/21/20 10/27/22  Copland, Gay Filler, MD  furosemide (LASIX) 20 MG tablet Take 1 tablet (20 mg total) by mouth daily. Patient taking differently: Take 20 mg by mouth daily at 6 PM. 05/28/20   Bhagat, Bhavinkumar, PA  HYDROcodone-acetaminophen (NORCO) 10-325 MG tablet Take 1 tablet by mouth every 6 (six) hours as needed for severe pain. 10/30/20   Britt Bottom, PA-C  meloxicam (MOBIC) 15 MG tablet Take 1 tablet (15 mg total) by mouth daily as needed for pain (and inflammation). 10/30/20   Britt Bottom, PA-C  Multiple Vitamin (MULTIVITAMIN WITH MINERALS) TABS tablet Take 1 tablet by mouth daily at 6 PM.    [provider]  nitroGLYCERIN (NITROSTAT) 0.4 MG SL tablet Place 1 tablet (0.4 mg total) under the tongue every 5 (five) minutes as needed for chest pain. 05/21/20   Copland, Gay Filler, MD  ondansetron (ZOFRAN ODT) 4 MG disintegrating tablet Take 1 tablet (4 mg total) by mouth 2 (two) times daily as needed for nausea or vomiting. 10/30/20   Britt Bottom, PA-C  tamsulosin (FLOMAX) 0.4 MG CAPS capsule Take 1 capsule (0.4  mg total) by mouth daily. Patient taking differently: Take 0.4 mg by mouth every evening. 05/21/20   Copland, Gay Filler, MD  Past Surgical History Past Surgical History:  Procedure Laterality Date   CARPAL TUNNEL RELEASE Right 10/30/2020   Procedure: CARPAL TUNNEL RELEASE;  Surgeon: Renette Butters, MD;  Location: [facility];  Service: Orthopedics;  Laterality: Right;   CORONARY ANGIOGRAPHY N/A 04/24/2019   Procedure: CORONARY ANGIOGRAPHY;  Surgeon: Martinique, Peter M, MD;  Location: [facility];  Service: Cardiovascular;  Laterality: N/A;   CORONARY/GRAFT ACUTE MI REVASCULARIZATION N/A 04/24/2019   Procedure: Coronary/Graft Acute MI Revascularization;  Surgeon: Martinique, Peter M, MD;  Location: [facility];  Service: Cardiovascular;  Laterality: N/A;   ERCP N/A 05/13/2019   Procedure: ENDOSCOPIC RETROGRADE CHOLANGIOPANCREATOGRAPHY (ERCP);  Surgeon: Ronnette Juniper, MD;  Location: [facility];  Service: Gastroenterology;  Laterality: N/A;   EYE SURGERY  1954   corrective surgery for crossed eyes   EYE SURGERY  1959   LEFT HEART CATH AND CORONARY ANGIOGRAPHY N/A 04/24/2019   Procedure: LEFT HEART CATH AND CORONARY ANGIOGRAPHY;  Surgeon: Martinique, Peter M, MD;  Location: [facility];  Service: Cardiovascular;  Laterality: N/A;   LEFT HEART CATH AND CORONARY ANGIOGRAPHY N/A 05/28/2020   Procedure: LEFT HEART CATH AND CORONARY ANGIOGRAPHY;  Surgeon: Burnell Blanks, MD;  Location: [facility];  Service: Cardiovascular;  Laterality: N/A;   LEFT HEART CATHETERIZATION WITH CORONARY ANGIOGRAM N/A 01/05/2012   Procedure: LEFT HEART CATHETERIZATION WITH CORONARY ANGIOGRAM;  Surgeon: Larey Dresser, MD;  Location[facility];  Service: Cardiovascular;  Laterality: N/A;   ORIF WRIST FRACTURE Right 10/30/2020   Procedure: OPEN REDUCTION INTERNAL FIXATION  (ORIF) WRIST FRACTURE;  Surgeon: Renette Butters, MD;  Location: [facility];  Service: Orthopedics;  Laterality: Right;   REMOVAL OF STONES  05/13/2019   Procedure: REMOVAL OF STONES;  Surgeon: Ronnette Juniper, MD;  Location: [facility];  Service: Gastroenterology;;   [name]  05/13/2019   Procedure: [name];  Surgeon: Ronnette Juniper, MD;  Location: [facility];  Service: Gastroenterology;;   Family History Family History  Problem Relation Age of Onset   Heart attack Mother 63   Diabetes Mother    Heart disease Mother    Hyperlipidemia Father    Hypertension Father    Diabetes Maternal Grandfather    Heart disease Maternal Grandfather    Early death Maternal Grandfather    Heart attack Maternal Grandfather    Hearing loss Sister    Hyperlipidemia Sister    Depression Brother    Diabetes Brother    Hyperlipidemia Brother    Early death Maternal Grandmother    Heart disease Maternal Grandmother    Heart attack Maternal Grandmother    Hearing loss Sister     Social History Social History   Tobacco Use   Smoking status: Every Day    Packs/day: 0.10    Years: 55.00    Pack years: 5.50    Types: Cigarettes   Smokeless tobacco: Never   Tobacco comments:    almost quit, very rare, 1-2 daily  Vaping Use   Vaping Use: Never used  Substance Use Topics   Alcohol use: Yes    Alcohol/week: 1.0 standard drink    Types: 1 Standard drinks or equivalent per week   Drug use: Not Currently    Types: Cocaine    Comment: history of cocaine last use 2014   Allergies Patient has no known allergies.  Review of Systems Review of Systems  Constitutional:  Negative for chills and fever.  HENT:  Negative for rhinorrhea.  Respiratory:  Positive for cough and shortness of breath. Negative for wheezing.   Cardiovascular:  Positive for chest pain (only with coughing).  Musculoskeletal:  Negative for myalgias.  Neurological:  Negative for headaches.  All other systems are  reviewed and are negative for acute change except as noted in the HPI  Physical Exam Vital Signs  I have reviewed the triage vital signs BP 114/70   Pulse 88   Temp 98 F (36.7 C) (Oral)   Resp 20   SpO2 96%   Physical Exam Vitals reviewed.  Constitutional:      General: He is not in acute distress.    Appearance: He is well-developed. He is not diaphoretic.  HENT:     Head: Normocephalic and atraumatic.     Nose: Nose normal.  Eyes:     General: No scleral icterus.       Right eye: No discharge.        Left eye: No discharge.     Conjunctiva/sclera: Conjunctivae normal.     Pupils: Pupils are equal, round, and reactive to light.  Cardiovascular:     Rate and Rhythm: Normal rate and regular rhythm.     Heart sounds: No murmur heard.   No friction rub. No gallop.  Pulmonary:     Effort: Pulmonary effort is normal. No respiratory distress.     Breath sounds: No stridor. Examination of the right-lower field reveals rales. Rales present.  Abdominal:     General: There is no distension.     Palpations: Abdomen is soft.     Tenderness: There is no abdominal tenderness.  Musculoskeletal:        General: No tenderness.     Cervical back: Normal range of motion and neck supple.  Skin:    General: Skin is warm and dry.     Findings: No erythema or rash.  Neurological:     Mental Status: He is alert and oriented to person, place, and time.    ED Results and Treatments Labs (all labs ordered are listed, but only abnormal results are displayed) Labs Reviewed  BASIC METABOLIC PANEL - Abnormal; Notable for the following components:      Result Value   Sodium 131 (*)    Chloride 96 (*)    Glucose, Bld 102 (*)    All other components within normal limits  CBC - Abnormal; Notable for the following components:   RBC 4.12 (*)    MCV 102.2 (*)    MCH 35.0 (*)    All other components within normal limits  RESP PANEL BY RT-PCR (FLU A&B, COVID) [id number]                                                                                                                          EKG  EKG Interpretation  Date/Time:  Monday December 10 2020 20:48:04 EDT Ventricular Rate:  106 PR Interval:  133 QRS Duration: 101 QT Interval:  357  QTC Calculation: 475 R Axis:   73 Text Interpretation: Sinus tachycardia Multiple premature complexes, vent & supraven Probable left atrial enlargement Borderline repolarization abnormality Artifact Otherwise no significant change Confirmed by Addison Lank [phone]) on 12/10/2020 11:28:52 PM       Radiology DG Chest 2 View  Result Date: 12/10/2020 CLINICAL DATA:  Short of breath, productive cough for 1 week EXAM: CHEST - 2 VIEW COMPARISON:  10/21/2020 FINDINGS: Frontal and lateral views of the chest demonstrate a stable cardiac silhouette. Interval development of multifocal bilateral airspace disease, greatest in the right upper and right lower lobes, consistent with bilateral bronchopneumonia. Trace right pleural effusion. No pneumothorax. No acute bony abnormalities. IMPRESSION: 1. Multifocal bilateral bronchopneumonia, greatest in the right upper and right lower lobes. Electronically Signed   By: Randa Ngo M.D.   On: 12/10/2020 21:08    Pertinent labs & imaging results that were available during my care of the patient were reviewed by me and considered in my medical decision making (see MDM for details).  Medications Ordered in ED Medications  albuterol (VENTOLIN HFA) 108 (90 Base) MCG/ACT inhaler 4 puff (4 puffs Inhalation Given 12/10/20 2329)  doxycycline (VIBRA-TABS) tablet 100 mg (100 mg Oral Given 12/10/20 2329)                                                                                                                                     Procedures Procedures  (including critical care time)  Medical Decision Making / ED Course I have reviewed the nursing notes for this encounter and the patient's prior records (if available  in EHR or on provided paperwork).  Hunter Mcmillan was evaluated in Emergency Department on 12/10/2020 for the symptoms described in the history of present illness. He was evaluated in the context of the global COVID-19 pandemic, which necessitated consideration that the patient might be at risk for infection with the SARS-CoV-2 virus that causes COVID-19. Institutional protocols and algorithms that pertain to the evaluation of patients at risk for COVID-19 are in a state of rapid change based on information released by regulatory bodies including the CDC and federal and state organizations. These policies and algorithms were followed during the patient's care in the ED.     Consistent with CAP. Satting well on RA. Not septic. Given albuterol and 1st dose of ABx  Pertinent labs & imaging results that were available during my care of the patient were reviewed by me and considered in my medical decision making:    Final Clinical Impression(s) / ED Diagnoses Final diagnoses:  Bronchopneumonia   The patient appears reasonably screened and/or stabilized for discharge and I doubt any other medical condition or other [facility] requiring further screening, evaluation, or treatment in the ED at this time prior to discharge. Safe for discharge with strict return precautions.  Disposition: Discharge  Condition: Good  I have discussed the results, Dx and Tx plan with the  patient/family who expressed understanding and agree(s) with the plan. Discharge instructions discussed at length. The patient/family was given strict return precautions who verbalized understanding of the instructions. No further questions at time of discharge.    ED Discharge Orders          Ordered    doxycycline (VIBRAMYCIN) 100 MG capsule  2 times daily        12/10/20 2324    predniSONE (DELTASONE) 10 MG tablet  Daily        12/10/20 2324             Follow Up: [name], MD [address] [place] [address] [phone]  Call  to schedule an appointment for close follow up, in 3-5 days     This chart was dictated using voice recognition software.  Despite best efforts to proofread,  errors can occur which can change the documentation meaning.    Fatima Blank, MD 12/10/20 2330

## 2020-12-10 NOTE — ED Triage Notes (Signed)
Pt [place]. Pt c/o SOB and cough x3 days. EMS reports clear lung sounds. Pt states he clear phlegm coming up. Pt states he quit smoking 2 weeks ago. Pt has emphysema and COPD.  95% RA 160/80 HR 100 20 Resp

## 2020-12-10 NOTE — ED Provider Notes (Signed)
Emergency Medicine Provider Triage Evaluation Note  Hunter Mcmillan , a 70 y.o. male  was evaluated in triage.  Pt complains of shortness of breath and cough.  Symptoms present x1 week.  Producing clear to white phlegm.  History of emphysema and COPD.  Patient reports he is having chest pain when coughing.  No other chest pain reported.  Review of Systems  Positive: Shortness of breath, cough, fever, chills, diarrhea Negative: Abdominal pain, nausea, vomiting, hemoptysis, diaphoresis  Physical Exam  BP 114/70   Pulse 88   Temp 98 F (36.7 C) (Oral)   Resp 20   SpO2 96%  Gen:   Awake, no distress   Resp:  Normal effort, lungs clear to auscultation bilaterally MSK:   Moves extremities without difficulty  Other:    Medical Decision Making  Medically screening exam initiated at 8:45 PM.  Appropriate orders placed.  Hunter Mcmillan was informed that the remainder of the evaluation will be completed by another provider, this initial triage assessment does not replace that evaluation, and the importance of remaining in the ED until their evaluation is complete.  The patient appears stable so that the remainder of the work up may be completed by another provider.      Berneice Heinrich 12/10/20 2047    Wynetta Fines, MD 12/10/20 [phone]

## 2020-12-11 ENCOUNTER — Encounter (HOSPITAL_COMMUNITY): Payer: Self-pay | Admitting: Emergency Medicine

## 2020-12-11 ENCOUNTER — Emergency Department (HOSPITAL_COMMUNITY): Payer: Medicare PPO

## 2020-12-11 ENCOUNTER — Emergency Department (HOSPITAL_COMMUNITY)
Admission: EM | Admit: 2020-12-11 | Discharge: 2020-12-11 | Disposition: A | Discharge status: Home / Self Care | Payer: Medicare PPO | Attending: Emergency Medicine | Admitting: Emergency Medicine

## 2020-12-11 DIAGNOSIS — Z955 Presence of coronary angioplasty implant and graft: Secondary | ICD-10-CM | POA: Diagnosis not present

## 2020-12-11 DIAGNOSIS — Z79899 Other long term (current) drug therapy: Secondary | ICD-10-CM | POA: Diagnosis not present

## 2020-12-11 DIAGNOSIS — I25110 Atherosclerotic heart disease of native coronary artery with unstable angina pectoris: Secondary | ICD-10-CM | POA: Insufficient documentation

## 2020-12-11 DIAGNOSIS — Z7982 Long term (current) use of aspirin: Secondary | ICD-10-CM | POA: Diagnosis not present

## 2020-12-11 DIAGNOSIS — F17210 Nicotine dependence, cigarettes, uncomplicated: Secondary | ICD-10-CM | POA: Diagnosis not present

## 2020-12-11 DIAGNOSIS — R059 Cough, unspecified: Secondary | ICD-10-CM | POA: Diagnosis not present

## 2020-12-11 DIAGNOSIS — I10 Essential (primary) hypertension: Secondary | ICD-10-CM | POA: Insufficient documentation

## 2020-12-11 DIAGNOSIS — J180 Bronchopneumonia, unspecified organism: Secondary | ICD-10-CM | POA: Diagnosis not present

## 2020-12-11 DIAGNOSIS — J439 Emphysema, unspecified: Secondary | ICD-10-CM | POA: Diagnosis not present

## 2020-12-11 DIAGNOSIS — J449 Chronic obstructive pulmonary disease, unspecified: Secondary | ICD-10-CM | POA: Diagnosis not present

## 2020-12-11 LAB — CBC WITH DIFFERENTIAL/PLATELET
Abs Immature Granulocytes: 0.03 10*3/uL (ref 0.00–0.07)
Basophils Absolute: 0 10*3/uL (ref 0.0–0.1)
Basophils Relative: 1 %
Eosinophils Absolute: 0.1 10*3/uL (ref 0.0–0.5)
Eosinophils Relative: 1 %
HCT: 43.2 % (ref 39.0–52.0)
Hemoglobin: 14.6 g/dL (ref 13.0–17.0)
Immature Granulocytes: 1 %
Lymphocytes Relative: 23 %
Lymphs Abs: 1.4 10*3/uL (ref 0.7–4.0)
MCH: 34.9 pg — ABNORMAL HIGH (ref 26.0–34.0)
MCHC: 33.8 g/dL (ref 30.0–36.0)
MCV: 103.3 fL — ABNORMAL HIGH (ref 80.0–100.0)
Monocytes Absolute: 0.5 10*3/uL (ref 0.1–1.0)
Monocytes Relative: 9 %
Neutro Abs: 4.1 10*3/uL (ref 1.7–7.7)
Neutrophils Relative %: 65 %
Platelets: 297 10*3/uL (ref 150–400)
RBC: 4.18 MIL/uL — ABNORMAL LOW (ref 4.22–5.81)
RDW: 14 % (ref 11.5–15.5)
WBC: 6.2 10*3/uL (ref 4.0–10.5)
nRBC: 0 % (ref 0.0–0.2)

## 2020-12-11 LAB — BASIC METABOLIC PANEL
Anion gap: 8 (ref 5–15)
BUN: 13 mg/dL (ref 8–23)
CO2: 30 mmol/L (ref 22–32)
Calcium: 9.2 mg/dL (ref 8.9–10.3)
Chloride: 97 mmol/L — ABNORMAL LOW (ref 98–111)
Creatinine, Ser: 0.78 mg/dL (ref 0.61–1.24)
GFR, Estimated: >60 mL/min (ref >60)
Glucose, Bld: 95 mg/dL (ref 70–99)
Potassium: 4 mmol/L (ref 3.5–5.1)
Sodium: 135 mmol/L (ref 135–145)

## 2020-12-11 LAB — LACTIC ACID, PLASMA: Lactic Acid, Venous: 1.3 mmol/L (ref 0.5–1.9)

## 2020-12-11 MED ORDER — DOXYCYCLINE HYCLATE 100 MG PO TABS
100.0000 mg | ORAL_TABLET | Freq: Once | ORAL | Status: AC
  Administered 2020-12-11: 100 mg via ORAL
  Filled 2020-12-11: qty 1

## 2020-12-11 NOTE — Progress Notes (Signed)
CSW spoke with pt , pt reports issues with getting his medication. Pt stated he does not have a ride to get to his pharmacy on [address] in [place]. CSW provided pt with three buss passes to help assist with getting to the pharmacy.   Valentina Shaggy.Tiena Manansala, MSW, LCSWA [facility]  Transitions of Care Clinical Social Worker I Direct Dial: [phone]  Fax: [phone] [email or web address]

## 2020-12-11 NOTE — ED Triage Notes (Signed)
Per pt, states he was diagnosed with PNA yesterday-did not get his antibiotic filled because he didn't have a ride-cough, congestion

## 2020-12-11 NOTE — ED Provider Notes (Signed)
At signout the patient was awaiting social work assistance with facilitation of obtaining his medications.  He has been seen by our colleagues, and is appropriate for discharge.   Gerhard Munch, MD 12/11/20 [phone]

## 2020-12-11 NOTE — Discharge Instructions (Addendum)
Please be sure to use the provided bus passes to obtain your medications.  Return here for concerning changes in your condition.

## 2020-12-11 NOTE — ED Provider Notes (Signed)
[facility] DEPT Provider Note   CSN: [id number] Arrival date & time: 12/11/20  7616     History Chief Complaint  Patient presents with   Cough    [facility] Mcmill[facility] is a 70 y.o. male.  HPI He presents for evaluation of persistent cough that is productive of "white" sputum.  He states he has not eaten yesterday because he does not have any food.  He lives in an abandoned vehicle, parked at a friend's house.  He has to go to a nearby gas station to clean up and use the bathroom.  He smokes cigarettes.  He was evaluated here during the evening yesterday, and discharged with a prescription for doxycycline.  He did not get it filled.  He feels like he was discharged inappropriately.  He states he has a history of heart disease and is status post cardiac stenting.  He denies shortness of breath, nausea, vomiting, focal weakness or paresthesia.  There are no other known active modifying factors.    Past Medical History:  Diagnosis Date   Abdominal pain 05/02/2019   Alcoholism ([place])    CAD (coronary artery disease)    a. 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 s/p [name] ~ 2003 in [place], [place] => stopped after 6 mos due to neutropenia - viral load noted to be low at that time;  no follow up since;   b. abdominal u/s 11/13:  diffuse hepatic steatosis and/or hepatocellular disease     Hx of echocardiogram    a. Echo 01/06/12: Mild focal basal hypertrophy the septum, vigorous LV, EF  65-70%, Gr 2 diast dysfn, trivial AI, MAC, trivial MR, trivial TR, PASP 36   Hyperlipidemia    Hypertension    Non-STEMI (non-ST elevated myocardial infarction) ([place]) 2019   Stroke ([place]) 2019   "minor" pt reported   Thrombocytopenia ([place])    Tobacco abuse     Patient Active Problem List   Diagnosis Date Noted   Precordial chest pain    Unstable angina ([place]) 05/26/2020   Prediabetes 05/22/2020   Acute cholecystitis 05/09/2019   Cholelithiasis 04/30/2019   RUQ abdominal pain    Presence of drug-eluting stent in right coronary artery 04/25/2019   Hyperlipidemia with target LDL less than 70 04/25/2019   Acute inferior STEMI 04/25/2019   STEMI involving right coronary artery ([place]) 04/24/2019   Abdominal aortic aneurysm (AAA) 3.0 cm to 5.5 cm in diameter in male 01/06/2017   Essential hypertension 04/09/2015   Hyperpigmentation of skin 10/05/2014   COPD (chronic obstructive pulmonary disease) ([place]) 06/26/2014   Sacroiliac joint dysfunction of right side 06/26/2014   Alcoholism ([place])    Cocaine abuse ([place]) 11/18/2013   Scrotal mass 07/21/2013   Cirrhosis ([place]) 05/28/2013   Routine general medical examination at a health care facility 07/19/2012   Abnormal TSH 04/09/2012   Coronary artery disease involving native heart  with unstable angina pectoris ([place]) 01/14/2012   Hx of non-ST elevation myocardial infarction (NSTEMI) 01/05/2012   Tobacco abuse 01/05/2012   Chronic hepatitis C without hepatic coma ([place]) 01/05/2012   Depression, recurrent ([place]) 01/05/2012   Thrombocytopenia ([place]) 01/05/2012    Past Surgical History:  Procedure Laterality Date   CARPAL TUNNEL RELEASE Right 10/30/2020   Procedure: CARPAL TUNNEL RELEASE;  Surgeon: Renette Butters, MD;  Location: [facility];  Service: Orthopedics;  Laterality: Right;   CORONARY ANGIOGRAPHY N/A 04/24/2019   Procedure: CORONARY ANGIOGRAPHY;  Surgeon: Martinique, Peter M, MD;  Location: [facility];  Service: Cardiovascular;   Laterality: N/A;   CORONARY/GRAFT ACUTE MI REVASCULARIZATION N/A 04/24/2019   Procedure: Coronary/Graft Acute MI Revascularization;  Surgeon: Martinique, Peter M, MD;  Location: [facility];  Service: Cardiovascular;  Laterality: N/A;   ERCP N/A 05/13/2019   Procedure: ENDOSCOPIC RETROGRADE CHOLANGIOPANCREATOGRAPHY (ERCP);  Surgeon: Ronnette Juniper, MD;  Location: [facility];  Service: Gastroenterology;  Laterality: N/A;   EYE SURGERY  1954   corrective surgery for crossed eyes   EYE SURGERY  1959   LEFT HEART CATH AND CORONARY ANGIOGRAPHY N/A 04/24/2019   Procedure: LEFT HEART CATH AND CORONARY ANGIOGRAPHY;  Surgeon: Martinique, Peter M, MD;  Location: [facility];  Service: Cardiovascular;  Laterality: N/A;   LEFT HEART CATH AND CORONARY ANGIOGRAPHY N/A 05/28/2020   Procedure: LEFT HEART CATH AND CORONARY ANGIOGRAPHY;  Surgeon: Burnell Blanks, MD;  Location: [facility];  Service: Cardiovascular;  Laterality: N/A;   LEFT HEART CATHETERIZATION WITH CORONARY ANGIOGRAM N/A 01/05/2012   Procedure: LEFT HEART CATHETERIZATION WITH CORONARY ANGIOGRAM;  Surgeon: Larey Dresser, MD;  [facility] C[facility];  Service: Cardiovascular;  Laterality: N/A;   ORIF WRIST FRACTURE Right 10/30/2020   Procedure: OPEN REDUCTION INTERNAL FIXATION (ORIF) WRIST FRACTURE;  Surgeon: Renette Butters, MD;  Location: [facility];  Service: Orthopedics;  Laterality: Right;   REMOVAL OF STONES  05/13/2019   Procedure: REMOVAL OF STONES;  Surgeon: Ronnette Juniper, MD;  Location: [facility];  Service: Gastroenterology;;   [name]  05/13/2019   Procedure: [name];  Surgeon: Ronnette Juniper, MD;  Location: [facility] ENDOSCOPY;  Service: Gastroenterology;;       Family History  Problem Relation Age of Onset   Heart attack Mother 31   Diabetes Mother    Heart disease Mother    Hyperlipidemia Father    Hypertension Father    Diabetes Maternal Grandfather    Heart disease Maternal Grandfather    Early  death Maternal Grandfather    Heart attack Maternal Grandfather    Hearing loss Sister    Hyperlipidemia Sister    Depression Brother    Diabetes Brother    Hyperlipidemia Brother    Early death Maternal Grandmother    Heart disease Maternal Grandmother    Heart attack Maternal Grandmother    Hearing loss Sister     Social History   Tobacco Use   Smoking status: Every Day    Packs/day: 0.10    Years: 55.00    Pack years: 5.50    Types: Cigarettes   Smokeless tobacco: Never   Tobacco comments:    almost quit, very rare, 1-2 daily  Vaping Use   Vaping Use: Never used  Substance Use Topics   Alcohol use: Yes    Alcohol/week: 1.0 standard drink    Types: 1 Standard drinks or equivalent per week   Drug use: Not Currently  Types: Cocaine    Comment: history of cocaine last use 2014    Home Medications Prior to Admission medications   Medication Sig Start Date End Date Taking? Authorizing Provider  acetaminophen (TYLENOL) 500 MG tablet Take 1 tablet (500 mg total) by mouth every 6 (six) hours as needed for mild pain, fever or headache. 05/13/19   Arrien, Jimmy Picket, MD  albuterol (VENTOLIN HFA) 108 (90 Base) MCG/ACT inhaler Inhale 2 puffs into the lungs every 6 (six) hours as needed for wheezing or shortness of breath. 05/19/19   Copland, Gay Filler, MD  aspirin EC 81 MG tablet Take 81 mg by mouth daily at 6 PM.    [provider]  atorvastatin (LIPITOR) 80 MG tablet Take 1 tablet (80 mg total) by mouth daily at 6 PM. 05/21/20   Copland, Gay Filler, MD  carvedilol (COREG) 6.25 MG tablet Take 1 tablet (6.25 mg total) by mouth 2 (two) times daily. 05/28/20   Leanor Kail, PA  diphenhydrAMINE (BENADRYL) 25 mg capsule Take 25 mg by mouth as needed for allergies.    [provider]  doxycycline (VIBRAMYCIN) 100 MG capsule Take 1 capsule (100 mg total) by mouth 2 (two) times daily. 12/10/20   Fatima Blank, MD  ezetimibe (ZETIA) 10 MG tablet Take 1  tablet (10 mg total) by mouth daily. Patient taking differently: Take 10 mg by mouth every evening. 05/21/20 10/27/22  Copland, Gay Filler, MD  furosemide (LASIX) 20 MG tablet Take 1 tablet (20 mg total) by mouth daily. Patient taking differently: Take 20 mg by mouth daily at 6 PM. 05/28/20   Bhagat, Bhavinkumar, PA  HYDROcodone-acetaminophen (NORCO) 10-325 MG tablet Take 1 tablet by mouth every 6 (six) hours as needed for severe pain. 10/30/20   Britt Bottom, PA-C  meloxicam (MOBIC) 15 MG tablet Take 1 tablet (15 mg total) by mouth daily as needed for pain (and inflammation). 10/30/20   Britt Bottom, PA-C  Multiple Vitamin (MULTIVITAMIN WITH MINERALS) TABS tablet Take 1 tablet by mouth daily at 6 PM.    [provider]  nitroGLYCERIN (NITROSTAT) 0.4 MG SL tablet Place 1 tablet (0.4 mg total) under the tongue every 5 (five) minutes as needed for chest pain. 05/21/20   Copland, Gay Filler, MD  ondansetron (ZOFRAN ODT) 4 MG disintegrating tablet Take 1 tablet (4 mg total) by mouth 2 (two) times daily as needed for nausea or vomiting. 10/30/20   Britt Bottom, PA-C  predniSONE (DELTASONE) 10 MG tablet Take 2 tablets (20 mg total) by mouth daily for 5 days. 12/10/20 12/15/20  Fatima Blank, MD  tamsulosin (FLOMAX) 0.4 MG CAPS capsule Take 1 capsule (0.4 mg total) by mouth daily. Patient taking differently: Take 0.4 mg by mouth every evening. 05/21/20   Copland, Gay Filler, MD    Allergies    Patient has no known allergies.  Review of Systems   Review of Systems  All other systems reviewed and are negative.  Physical Exam Updated Vital Signs BP 127/63 (BP Location: Left Arm)   Pulse 90   Temp 97.7 F (36.5 C) (Oral)   Resp 16   SpO2 94%   Physical Exam Vitals and nursing note reviewed.  Constitutional:      General: He is not in acute distress.    Appearance: He is well-developed. He is not ill-appearing, toxic-appearing or diaphoretic.  HENT:     Head: Normocephalic and  atraumatic.     Right Ear: External ear normal.  Left Ear: External ear normal.  Eyes:     Conjunctiva/sclera: Conjunctivae normal.     Pupils: Pupils are equal, round, and reactive to light.  Neck:     Trachea: Phonation normal.  Cardiovascular:     Rate and Rhythm: Normal rate and regular rhythm.     Heart sounds: Normal heart sounds.  Pulmonary:     Effort: Pulmonary effort is normal. No respiratory distress.     Breath sounds: Normal breath sounds. No stridor. No wheezing or rhonchi.     Comments: Oxygenation, 90 to 92% on room air. Chest:     Chest wall: No tenderness.  Abdominal:     General: There is no distension.     Palpations: Abdomen is soft.     Tenderness: There is no abdominal tenderness.  Musculoskeletal:        General: Normal range of motion.     Cervical back: Normal range of motion and neck supple.     Right lower leg: No edema.     Left lower leg: No edema.  Skin:    General: Skin is warm and dry.  Neurological:     Mental Status: He is alert and oriented to person, place, and time.     Cranial Nerves: No cranial nerve deficit.     Sensory: No sensory deficit.     Motor: No abnormal muscle tone.     Coordination: Coordination normal.  Psychiatric:        Mood and Affect: Mood normal.        Behavior: Behavior normal.        Thought Content: Thought content normal.        Judgment: Judgment normal.    ED Results / Procedures / Treatments   Labs (all labs ordered are listed, but only abnormal results are displayed) Labs Reviewed  BASIC METABOLIC PANEL - Abnormal; Notable for the following components:      Result Value   Chloride 97 (*)    All other components within normal limits  CBC WITH DIFFERENTIAL/PLATELET - Abnormal; Notable for the following components:   RBC 4.18 (*)    MCV 103.3 (*)    MCH 34.9 (*)    All other components within normal limits  LACTIC ACID, PLASMA    EKG None  Radiology DG Chest 2 View  Result Date:  12/11/2020 CLINICAL DATA:  Cough EXAM: CHEST - 2 VIEW COMPARISON:  Chest x-ray 12/10/2020, CT chest 05/27/2020 FINDINGS: The heart and mediastinal contours are unchanged. Aortic calcification. Coronary artery stent. Biapical pleural/pulmonary scarring. Hyperinflation of the lungs. Similar-appearing patchy airspace opacities within the right lower lobe, right upper lobe, and left lower lobe. Persistent increased interstitial markings with no pulmonary edema. No pleural effusion. No pneumothorax. No acute osseous abnormality. Multilevel degenerative changes of the spine. IMPRESSION: 1. Persistent multifocal bronchopneumonia. Followup PA and lateral chest X-ray is recommended in 3-4 weeks following therapy to ensure resolution and exclude underlying malignancy. 2. Aortic Atherosclerosis (ICD10-I70.0) and Emphysema (ICD10-J43.9). Electronically Signed   By: Iven Finn M.D.   On: 12/11/2020 15:21   DG Chest 2 View  Result Date: 12/10/2020 CLINICAL DATA:  Short of breath, productive cough for 1 week EXAM: CHEST - 2 VIEW COMPARISON:  10/21/2020 FINDINGS: Frontal and lateral views of the chest demonstrate a stable cardiac silhouette. Interval development of multifocal bilateral airspace disease, greatest in the right upper and right lower lobes, consistent with bilateral bronchopneumonia. Trace right pleural effusion. No pneumothorax. No acute bony abnormalities.  IMPRESSION: 1. Multifocal bilateral bronchopneumonia, greatest in the right upper and right lower lobes. Electronically Signed   By: Randa Ngo M.D.   On: 12/10/2020 21:08    Procedures Procedures   Medications Ordered in ED Medications  doxycycline (VIBRA-TABS) tablet 100 mg (100 mg Oral Given 12/11/20 1540)    ED Course  I have reviewed the triage vital signs and the nursing notes.  Pertinent labs & imaging results that were available during my care of the patient were reviewed by me and considered in my medical decision making (see  chart for details).    MDM Rules/Calculators/A&P                            Patient Vitals for the past 24 hrs:  BP Temp Temp src Pulse Resp SpO2  12/11/20 2004 127/63 97.7 F (36.5 C) Oral 90 16 94 %  12/11/20 1900 104/71 -- -- 92 18 97 %     Medical Decision Making:  This patient is presenting for evaluation of persistent cough with inability to get prescribed medicines to treat bronchopneumonia, which does require a range of treatment options, and is a complaint that involves a moderate risk of morbidity and mortality. The differential diagnoses include pneumonia, bronchitis, homelessness, impoverished state. I decided to review old records, and in summary elderly male, who lives in an abandoned truck, without access to electricity and water, presenting with persistent symptoms and not being able to afford medications.  I did not require additional historical information from anyone.  Clinical Laboratory Tests Ordered, included CBC, Metabolic panel, and lactic acid . Review indicates normal except as chloride low, MCV high. Radiologic Tests Ordered, included chest x-ray.  I independently Visualized: Radiographic images, which show multifocal pneumonia   Critical Interventions-clinical evaluation, laboratory testing, radiography, consultation with social services for patient to obtain medications  After These Interventions, the Patient was reevaluated and was found stable for discharge.  Anticipate discharge, when he receives medication to go home with.  CRITICAL CARE-no Performed by: Daleen Bo  Nursing Notes Reviewed/ Care Coordinated Applicable Imaging Reviewed Interpretation of Laboratory Data incorporated into ED treatment   Care transferred to Dr. Vanita Panda to disposition after completion of treatment.  Final Clinical Impression(s) / ED Diagnoses Final diagnoses:  Bronchopneumonia    Rx / DC Orders ED Discharge Orders     None        Daleen Bo,  MD 12/12/20 1836

## 2020-12-11 NOTE — ED Notes (Signed)
Pt given [place] sandwich and water, regular tray ordered and await delivery.  Pt is resting and in no distress at this time

## 2020-12-11 NOTE — ED Notes (Signed)
Pt ambulatory in ED lobby. 

## 2020-12-12 DIAGNOSIS — S52501D Unspecified fracture of the lower end of right radius, subsequent encounter for closed fracture with routine healing: Secondary | ICD-10-CM | POA: Diagnosis not present

## 2020-12-14 ENCOUNTER — Telehealth: Payer: Self-pay | Admitting: Student

## 2020-12-14 MED ORDER — ENTRESTO 24-26 MG PO TABS: 1.0000 | ORAL_TABLET | Freq: Two times a day (BID) | ORAL | 1 refills | Status: DC

## 2020-12-14 NOTE — Telephone Encounter (Signed)
Spoke with pt regarding entresto prescription. Pt was started on the medication at his last office visit with Marjie Skiff, PA-C on 10/25/20. Per note pt was to stop taking losartan and replace with entresto 24-26mg . Per documentation it appears that this order was placed as a clinic administered medication and not actually sent to the pharmacy.  Prescription sent to pharmacy. Pt verbalizes understanding.

## 2020-12-14 NOTE — Telephone Encounter (Signed)
Pt c/o medication issue:  1. Name of Medication: Entresto 24/26 mg  2. How are you currently taking this medication (dosage and times per day)? Not started   3. Are you having a reaction (difficulty breathing--STAT)? No   4. What is your medication issue? [facility] is calling stating this medication was never sent to the pharmacy.

## 2021-01-21 ENCOUNTER — Telehealth: Payer: Self-pay | Admitting: Family Medicine

## 2021-01-21 NOTE — Telephone Encounter (Signed)
I attempted to leave  message for patient to call back and schedule Medicare Annual Wellness Visit (AWV) in office. No voice mail.  If not able to come in office, please offer to do virtually or by telephone.  Left office number and my jabber [phone].  Last AWV:10/08/2015  Please schedule at anytime with Nurse Health Advisor.

## 2021-02-28 IMAGING — DX DG CHEST 1V PORT
1 series · 1 of 1 positions shown · non-contrast
Comparison: Chest x-ray 05/09/2019, 04/24/2019.  CT 11/28/2016.

CLINICAL DATA: Cough.

EXAM:
PORTABLE CHEST 1 VIEW

[chest ap]
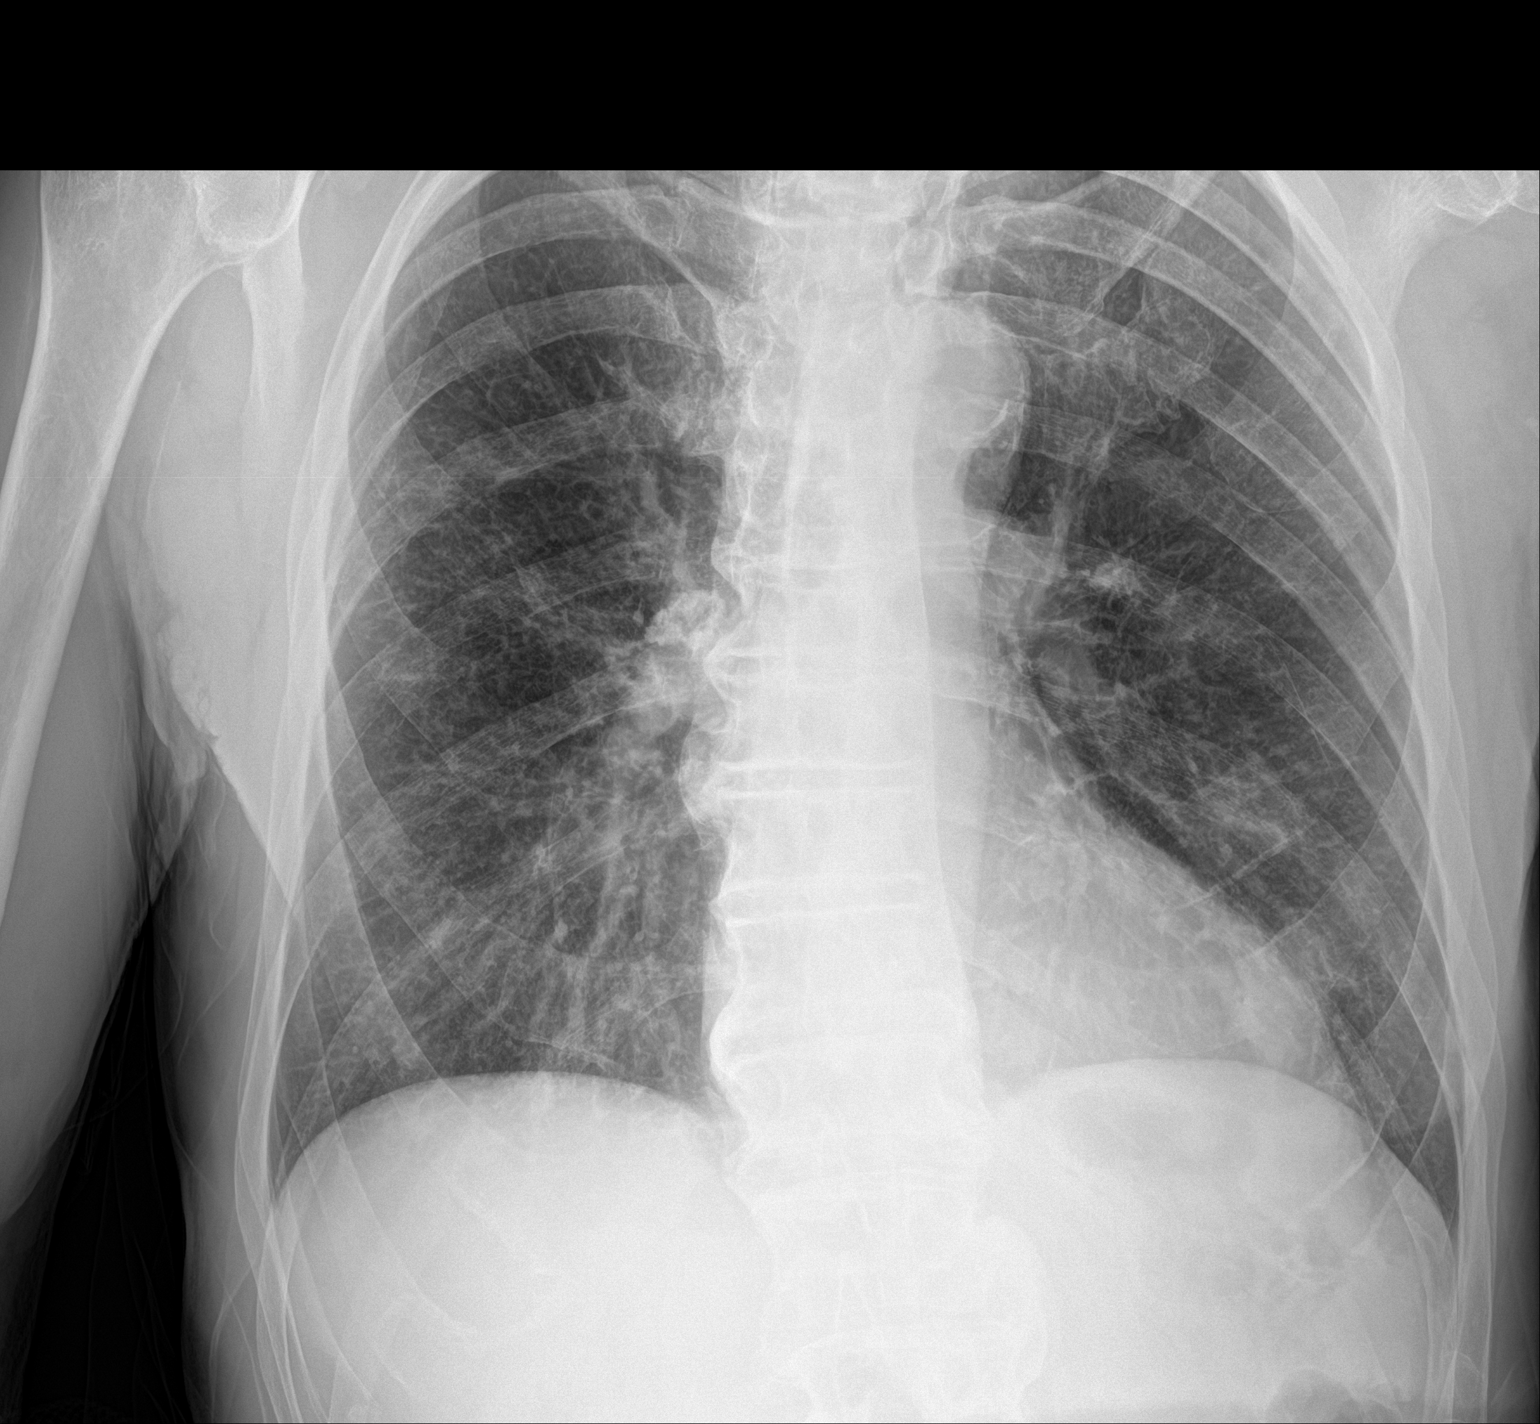

[1 of 1 positions shown; findings below may reference images not displayed]

FINDINGS: Mediastinum and hilar structures normal. Heart size stable. No
pulmonary venous congestion. Mild bilateral interstitial prominence
a component of which may be chronic. Active pneumonitis cannot be
excluded. Mild left base atelectasis and or infiltrate. No pleural
effusion or pneumothorax. Degenerative change and scoliosis thoracic
spine.
IMPRESSION: Mild bilateral interstitial prominence a component of which may be
chronic. Active pneumonitis cannot be excluded. Mild left base
atelectasis and or infiltrate.

## 2021-03-01 DIAGNOSIS — Z1152 Encounter for screening for COVID-19: Secondary | ICD-10-CM | POA: Diagnosis not present

## 2021-03-06 DIAGNOSIS — Z1152 Encounter for screening for COVID-19: Secondary | ICD-10-CM | POA: Diagnosis not present

## 2021-03-14 ENCOUNTER — Telehealth: Payer: Self-pay | Admitting: Family Medicine

## 2021-03-14 NOTE — Telephone Encounter (Signed)
I attempted to leave  message for patient to call back and schedule Medicare Annual Wellness Visit (AWV) in office. No voice mail.  If not able to come in office, please offer to do virtually or by telephone.  Left office number and my jabber #[phone].  Last AWV:10/08/2015  Please schedule at anytime with Nurse Health Advisor.   

## 2021-03-17 DIAGNOSIS — Z1152 Encounter for screening for COVID-19: Secondary | ICD-10-CM | POA: Diagnosis not present

## 2021-04-30 ENCOUNTER — Telehealth: Payer: Self-pay | Admitting: Family Medicine

## 2021-04-30 NOTE — Telephone Encounter (Signed)
I attempted to leave  message for patient to call back and schedule Medicare Annual Wellness Visit (AWV) in office. No voice mail.  If not able to come in office, please offer to do virtually or by telephone.  Left office number and my jabber #[phone].  Last AWV:10/08/2015  Please schedule at anytime with Nurse Health Advisor.   

## 2021-05-02 DIAGNOSIS — Z1152 Encounter for screening for COVID-19: Secondary | ICD-10-CM | POA: Diagnosis not present

## 2021-05-08 DIAGNOSIS — Z125 Encounter for screening for malignant neoplasm of prostate: Secondary | ICD-10-CM | POA: Diagnosis not present

## 2021-05-08 DIAGNOSIS — I5032 Chronic diastolic (congestive) heart failure: Secondary | ICD-10-CM | POA: Diagnosis not present

## 2021-05-08 DIAGNOSIS — F1410 Cocaine abuse, uncomplicated: Secondary | ICD-10-CM | POA: Diagnosis not present

## 2021-05-08 DIAGNOSIS — I2510 Atherosclerotic heart disease of native coronary artery without angina pectoris: Secondary | ICD-10-CM | POA: Diagnosis not present

## 2021-05-08 DIAGNOSIS — F1020 Alcohol dependence, uncomplicated: Secondary | ICD-10-CM | POA: Diagnosis not present

## 2021-05-08 DIAGNOSIS — I10 Essential (primary) hypertension: Secondary | ICD-10-CM | POA: Diagnosis not present

## 2021-05-08 DIAGNOSIS — I700 Atherosclerosis of aorta: Secondary | ICD-10-CM | POA: Diagnosis not present

## 2021-05-08 DIAGNOSIS — F1290 Cannabis use, unspecified, uncomplicated: Secondary | ICD-10-CM | POA: Diagnosis not present

## 2021-05-08 DIAGNOSIS — Z79899 Other long term (current) drug therapy: Secondary | ICD-10-CM | POA: Diagnosis not present

## 2021-05-09 DIAGNOSIS — Z1152 Encounter for screening for COVID-19: Secondary | ICD-10-CM | POA: Diagnosis not present

## 2021-05-11 DIAGNOSIS — Z1152 Encounter for screening for COVID-19: Secondary | ICD-10-CM | POA: Diagnosis not present

## 2021-05-14 DIAGNOSIS — Z125 Encounter for screening for malignant neoplasm of prostate: Secondary | ICD-10-CM | POA: Diagnosis not present

## 2021-05-14 DIAGNOSIS — Z79899 Other long term (current) drug therapy: Secondary | ICD-10-CM | POA: Diagnosis not present

## 2021-05-14 DIAGNOSIS — I5022 Chronic systolic (congestive) heart failure: Secondary | ICD-10-CM | POA: Diagnosis not present

## 2021-05-14 DIAGNOSIS — I2510 Atherosclerotic heart disease of native coronary artery without angina pectoris: Secondary | ICD-10-CM | POA: Diagnosis not present

## 2021-06-03 ENCOUNTER — Other Ambulatory Visit: Payer: Self-pay | Admitting: Student

## 2021-06-03 DIAGNOSIS — J439 Emphysema, unspecified: Secondary | ICD-10-CM

## 2021-06-03 DIAGNOSIS — F17219 Nicotine dependence, cigarettes, with unspecified nicotine-induced disorders: Secondary | ICD-10-CM

## 2021-06-25 ENCOUNTER — Inpatient Hospital Stay: Admission: RE | Admit: 2021-06-25 | Payer: Medicare PPO | Source: Ambulatory Visit

## 2022-04-29 ENCOUNTER — Inpatient Hospital Stay (HOSPITAL_COMMUNITY)
Admission: EM | Admit: 2022-04-29 | Discharge: 2022-05-02 | DRG: 190 | Disposition: A | Discharge status: Home / Self Care | Payer: Medicare HMO | Attending: Internal Medicine | Admitting: Internal Medicine

## 2022-04-29 ENCOUNTER — Encounter (HOSPITAL_COMMUNITY): Payer: Self-pay | Admitting: Internal Medicine

## 2022-04-29 ENCOUNTER — Other Ambulatory Visit: Payer: Self-pay

## 2022-04-29 ENCOUNTER — Emergency Department (HOSPITAL_COMMUNITY): Payer: Medicare HMO

## 2022-04-29 DIAGNOSIS — Z7982 Long term (current) use of aspirin: Secondary | ICD-10-CM

## 2022-04-29 DIAGNOSIS — Z79899 Other long term (current) drug therapy: Secondary | ICD-10-CM | POA: Diagnosis not present

## 2022-04-29 DIAGNOSIS — I252 Old myocardial infarction: Secondary | ICD-10-CM

## 2022-04-29 DIAGNOSIS — R0902 Hypoxemia: Principal | ICD-10-CM

## 2022-04-29 DIAGNOSIS — E872 Acidosis, unspecified: Secondary | ICD-10-CM | POA: Diagnosis present

## 2022-04-29 DIAGNOSIS — Z8673 Personal history of transient ischemic attack (TIA), and cerebral infarction without residual deficits: Secondary | ICD-10-CM

## 2022-04-29 DIAGNOSIS — J441 Chronic obstructive pulmonary disease with (acute) exacerbation: Principal | ICD-10-CM | POA: Diagnosis present

## 2022-04-29 DIAGNOSIS — Z1152 Encounter for screening for COVID-19: Secondary | ICD-10-CM

## 2022-04-29 DIAGNOSIS — J9601 Acute respiratory failure with hypoxia: Secondary | ICD-10-CM | POA: Diagnosis present

## 2022-04-29 DIAGNOSIS — J439 Emphysema, unspecified: Secondary | ICD-10-CM | POA: Diagnosis present

## 2022-04-29 DIAGNOSIS — Z8249 Family history of ischemic heart disease and other diseases of the circulatory system: Secondary | ICD-10-CM | POA: Diagnosis not present

## 2022-04-29 DIAGNOSIS — E785 Hyperlipidemia, unspecified: Secondary | ICD-10-CM | POA: Diagnosis present

## 2022-04-29 DIAGNOSIS — K7460 Unspecified cirrhosis of liver: Secondary | ICD-10-CM | POA: Diagnosis present

## 2022-04-29 DIAGNOSIS — F17210 Nicotine dependence, cigarettes, uncomplicated: Secondary | ICD-10-CM | POA: Diagnosis present

## 2022-04-29 DIAGNOSIS — Z23 Encounter for immunization: Secondary | ICD-10-CM | POA: Diagnosis present

## 2022-04-29 DIAGNOSIS — Z955 Presence of coronary angioplasty implant and graft: Secondary | ICD-10-CM | POA: Diagnosis not present

## 2022-04-29 DIAGNOSIS — F1010 Alcohol abuse, uncomplicated: Secondary | ICD-10-CM | POA: Diagnosis present

## 2022-04-29 DIAGNOSIS — Z818 Family history of other mental and behavioral disorders: Secondary | ICD-10-CM

## 2022-04-29 DIAGNOSIS — I110 Hypertensive heart disease with heart failure: Secondary | ICD-10-CM | POA: Diagnosis present

## 2022-04-29 DIAGNOSIS — Z83438 Family history of other disorder of lipoprotein metabolism and other lipidemia: Secondary | ICD-10-CM

## 2022-04-29 DIAGNOSIS — K760 Fatty (change of) liver, not elsewhere classified: Secondary | ICD-10-CM | POA: Diagnosis present

## 2022-04-29 DIAGNOSIS — D696 Thrombocytopenia, unspecified: Secondary | ICD-10-CM | POA: Diagnosis present

## 2022-04-29 DIAGNOSIS — B348 Other viral infections of unspecified site: Secondary | ICD-10-CM | POA: Diagnosis present

## 2022-04-29 DIAGNOSIS — I2510 Atherosclerotic heart disease of native coronary artery without angina pectoris: Secondary | ICD-10-CM | POA: Diagnosis present

## 2022-04-29 DIAGNOSIS — I5042 Chronic combined systolic (congestive) and diastolic (congestive) heart failure: Secondary | ICD-10-CM | POA: Diagnosis present

## 2022-04-29 DIAGNOSIS — Z833 Family history of diabetes mellitus: Secondary | ICD-10-CM

## 2022-04-29 LAB — COMPREHENSIVE METABOLIC PANEL
ALT: 20 U/L (ref 0–44)
AST: 36 U/L (ref 15–41)
Albumin: 4.3 g/dL (ref 3.5–5.0)
Alkaline Phosphatase: 78 U/L (ref 38–126)
Anion gap: 9 (ref 5–15)
BUN: 19 mg/dL (ref 8–23)
CO2: 26 mmol/L (ref 22–32)
Calcium: 9.1 mg/dL (ref 8.9–10.3)
Chloride: 102 mmol/L (ref 98–111)
Creatinine, Ser: 1.24 mg/dL (ref 0.61–1.24)
GFR, Estimated: >60 mL/min (ref >60)
Glucose, Bld: 114 mg/dL — ABNORMAL HIGH (ref 70–99)
Potassium: 4.5 mmol/L (ref 3.5–5.1)
Sodium: 137 mmol/L (ref 135–145)
Total Bilirubin: 0.9 mg/dL (ref 0.3–1.2)
Total Protein: 8.3 g/dL — ABNORMAL HIGH (ref 6.5–8.1)

## 2022-04-29 LAB — PROTIME-INR
INR: 1.1 (ref 0.8–1.2)
Prothrombin Time: 13.8 seconds (ref 11.4–15.2)

## 2022-04-29 LAB — CBC
HCT: 43.4 % (ref 39.0–52.0)
Hemoglobin: 14.3 g/dL (ref 13.0–17.0)
MCH: 33.7 pg (ref 26.0–34.0)
MCHC: 32.9 g/dL (ref 30.0–36.0)
MCV: 102.4 fL — ABNORMAL HIGH (ref 80.0–100.0)
Platelets: 112 10*3/uL — ABNORMAL LOW (ref 150–400)
RBC: 4.24 MIL/uL (ref 4.22–5.81)
RDW: 13.5 % (ref 11.5–15.5)
WBC: 3.5 10*3/uL — ABNORMAL LOW (ref 4.0–10.5)
nRBC: 0 % (ref 0.0–0.2)

## 2022-04-29 LAB — CBC WITH DIFFERENTIAL/PLATELET
Abs Immature Granulocytes: 0.01 10*3/uL (ref 0.00–0.07)
Basophils Absolute: 0 10*3/uL (ref 0.0–0.1)
Basophils Relative: 1 %
Eosinophils Absolute: 0.2 10*3/uL (ref 0.0–0.5)
Eosinophils Relative: 4 %
HCT: 48.5 % (ref 39.0–52.0)
Hemoglobin: 16.1 g/dL (ref 13.0–17.0)
Immature Granulocytes: 0 %
Lymphocytes Relative: 25 %
Lymphs Abs: 1 10*3/uL (ref 0.7–4.0)
MCH: 34.2 pg — ABNORMAL HIGH (ref 26.0–34.0)
MCHC: 33.2 g/dL (ref 30.0–36.0)
MCV: 103 fL — ABNORMAL HIGH (ref 80.0–100.0)
Monocytes Absolute: 0.5 10*3/uL (ref 0.1–1.0)
Monocytes Relative: 13 %
Neutro Abs: 2.2 10*3/uL (ref 1.7–7.7)
Neutrophils Relative %: 57 %
Platelets: 140 10*3/uL — ABNORMAL LOW (ref 150–400)
RBC: 4.71 MIL/uL (ref 4.22–5.81)
RDW: 13.5 % (ref 11.5–15.5)
WBC: 4 10*3/uL (ref 4.0–10.5)
nRBC: 0 % (ref 0.0–0.2)

## 2022-04-29 LAB — LACTIC ACID, PLASMA: Lactic Acid, Venous: 1.7 mmol/L (ref 0.5–1.9)

## 2022-04-29 LAB — TROPONIN I (HIGH SENSITIVITY)
Troponin I (High Sensitivity): 11 ng/L (ref <18)
Troponin I (High Sensitivity): 9 ng/L (ref <18)

## 2022-04-29 LAB — URINALYSIS, ROUTINE W REFLEX MICROSCOPIC
Bilirubin Urine: NEGATIVE
Glucose, UA: NEGATIVE mg/dL
Hgb urine dipstick: NEGATIVE
Ketones, ur: 5 mg/dL — AB
Leukocytes,Ua: NEGATIVE
Nitrite: NEGATIVE
Protein, ur: NEGATIVE mg/dL
Specific Gravity, Urine: 1.02 (ref 1.005–1.030)
pH: 5 (ref 5.0–8.0)

## 2022-04-29 LAB — APTT: aPTT: 32 seconds (ref 24–36)

## 2022-04-29 LAB — RESP PANEL BY RT-PCR (RSV, FLU A&B, COVID)  RVPGX2
Influenza A by PCR: NEGATIVE
Influenza B by PCR: NEGATIVE
Resp Syncytial Virus by PCR: NEGATIVE
SARS Coronavirus 2 by RT PCR: NEGATIVE

## 2022-04-29 LAB — CREATININE, SERUM
Creatinine, Ser: 1.15 mg/dL (ref 0.61–1.24)
GFR, Estimated: >60 mL/min (ref >60)

## 2022-04-29 MED ORDER — LACTATED RINGERS IV BOLUS (SEPSIS)
1000.0000 mL | Freq: Once | INTRAVENOUS | Status: AC
  Administered 2022-04-29: 1000 mL via INTRAVENOUS

## 2022-04-29 MED ORDER — METHYLPREDNISOLONE SODIUM SUCC 40 MG IJ SOLR
40.0000 mg | Freq: Two times a day (BID) | INTRAMUSCULAR | Status: AC
  Administered 2022-04-29 – 2022-04-30 (×2): 40 mg via INTRAVENOUS
  Filled 2022-04-29 (×2): qty 1

## 2022-04-29 MED ORDER — IPRATROPIUM-ALBUTEROL 0.5-2.5 (3) MG/3ML IN SOLN
3.0000 mL | Freq: Four times a day (QID) | RESPIRATORY_TRACT | Status: DC
  Administered 2022-04-29: 3 mL via RESPIRATORY_TRACT
  Filled 2022-04-29: qty 3

## 2022-04-29 MED ORDER — ALBUTEROL SULFATE (2.5 MG/3ML) 0.083% IN NEBU: 2.5000 mg | INHALATION_SOLUTION | RESPIRATORY_TRACT | Status: DC | PRN

## 2022-04-29 MED ORDER — METHYLPREDNISOLONE SODIUM SUCC 125 MG IJ SOLR
125.0000 mg | Freq: Once | INTRAMUSCULAR | Status: AC
  Administered 2022-04-29: 125 mg via INTRAVENOUS
  Filled 2022-04-29: qty 2

## 2022-04-29 MED ORDER — IPRATROPIUM-ALBUTEROL 0.5-2.5 (3) MG/3ML IN SOLN
3.0000 mL | Freq: Three times a day (TID) | RESPIRATORY_TRACT | Status: DC
  Administered 2022-04-30 – 2022-05-01 (×6): 3 mL via RESPIRATORY_TRACT
  Filled 2022-04-29 (×6): qty 3

## 2022-04-29 MED ORDER — SODIUM CHLORIDE 0.9 % IV SOLN
500.0000 mg | INTRAVENOUS | Status: AC
  Administered 2022-04-29: 500 mg via INTRAVENOUS
  Filled 2022-04-29: qty 5

## 2022-04-29 MED ORDER — IPRATROPIUM-ALBUTEROL 0.5-2.5 (3) MG/3ML IN SOLN
3.0000 mL | Freq: Once | RESPIRATORY_TRACT | Status: AC
  Administered 2022-04-29: 3 mL via RESPIRATORY_TRACT
  Filled 2022-04-29: qty 3

## 2022-04-29 MED ORDER — PREDNISONE 20 MG PO TABS
40.0000 mg | ORAL_TABLET | Freq: Every day | ORAL | Status: DC
  Administered 2022-05-01 – 2022-05-02 (×2): 40 mg via ORAL
  Filled 2022-04-29 (×2): qty 2

## 2022-04-29 MED ORDER — AZITHROMYCIN 250 MG PO TABS
500.0000 mg | ORAL_TABLET | Freq: Every day | ORAL | Status: DC
  Administered 2022-04-30 – 2022-05-02 (×3): 500 mg via ORAL
  Filled 2022-04-29 (×3): qty 2

## 2022-04-29 MED ORDER — INFLUENZA VAC A&B SA ADJ QUAD 0.5 ML IM PRSY
0.5000 mL | PREFILLED_SYRINGE | INTRAMUSCULAR | Status: AC
  Administered 2022-05-01: 0.5 mL via INTRAMUSCULAR
  Filled 2022-04-29: qty 0.5

## 2022-04-29 MED ORDER — IOHEXOL 350 MG/ML SOLN
100.0000 mL | Freq: Once | INTRAVENOUS | Status: AC | PRN
  Administered 2022-04-29: 100 mL via INTRAVENOUS

## 2022-04-29 MED ORDER — ENOXAPARIN SODIUM 40 MG/0.4ML IJ SOSY
40.0000 mg | PREFILLED_SYRINGE | INTRAMUSCULAR | Status: DC
  Administered 2022-04-29 – 2022-05-01 (×3): 40 mg via SUBCUTANEOUS
  Filled 2022-04-29 (×3): qty 0.4

## 2022-04-29 NOTE — H&P (Addendum)
History and Physical    Hunter Mcmillan [id number] DOB: 1950-06-19 DOA: 04/29/2022  PCP: [name], MD  Patient coming from: home  I have personally briefly reviewed patient's old medical records in [place]  Chief Complaint: 3 days cough ,fever, sob   HPI: Hunter Mcmillan is a 72 y.o. male with medical history significant of ETOH abuse, Hepatitis Cs/p treatment with Harvoni, Hyperlipidemia, CHFref 35% Hypertension,CVA,thrombocytopenia tobacco abuse, CAD,s/p NSTEMI s/p DES,COPD not on home O2, who presents to ED with sob, cough fever  x 3 days. In the field patient was noted by EMS to be 89% on ra  and was placed on 3L with improvement to 96%. Patient was also noted to have fever of 99.9 and was given  tylenol 650 prior to arrival in ED.  ED Course:  In ED vitals: afeb, bp 144/77, HR93, rrr 27 , sat 96% on ra on 3L  Labs: Wbc 4, hgb 16.1, MCV 103, plt 140 , irn 1.1 Latic 1.7  Na 137,K 4.5, Glu 114 cr 1.24  Resp panel neg   Cxr:  IMPRESSION: Emphysematous changes but no acute pulmonary findings.  CT PE IMPRESSION: 1. No evidence for pulmonary embolism. 2. No acute cardiopulmonary process. 3. Mediastinal and right hilar lymphadenopathy of uncertain etiology.   Tx : LR 1000 CE 11 [place]  Review of Systems: As per HPI otherwise 10 point review of systems negative.   Past Medical History:  Diagnosis Date   Abdominal pain 05/02/2019   Alcoholism ([place])    CAD (coronary artery disease)    a. 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 s/p [name] ~ 2003 in [place], [place] => stopped after 6 mos due to neutropenia - viral load noted to be low at that time;  no follow up since;   b. abdominal u/s 11/13:  diffuse hepatic steatosis and/or hepatocellular disease     Hx of echocardiogram    a. Echo 01/06/12: Mild focal basal hypertrophy the septum, vigorous LV, EF 65-70%, Gr 2 diast dysfn, trivial AI, MAC, trivial MR, trivial TR, PASP 36   Hyperlipidemia    Hypertension    Non-STEMI (non-ST elevated myocardial infarction) ([place]) 2019   Stroke ([place]) 2019   "minor" pt reported   Thrombocytopenia ([place])    Tobacco abuse     Past Surgical History:  Procedure Laterality Date   CARPAL TUNNEL RELEASE Right 10/30/2020   Procedure: CARPAL TUNNEL RELEASE;  Surgeon: Renette Butters, MD;  Location: [facility];  Service: Orthopedics;  Laterality: Right;   CORONARY ANGIOGRAPHY N/A 04/24/2019   Procedure: CORONARY ANGIOGRAPHY;  Surgeon: Martinique, Peter M, MD;  Location: [facility];  Service: Cardiovascular;  Laterality: N/A;   CORONARY/GRAFT ACUTE MI REVASCULARIZATION N/A 04/24/2019   Procedure: Coronary/Graft Acute MI Revascularization;  Surgeon: Martinique, Peter M, MD;  Location: [facility];  Service: Cardiovascular;  Laterality: N/A;  ERCP N/A 05/13/2019   Procedure: ENDOSCOPIC RETROGRADE CHOLANGIOPANCREATOGRAPHY (ERCP);  Surgeon: Ronnette Juniper, MD;  Location: [facility];  Service: Gastroenterology;  Laterality: N/A;   EYE SURGERY  1954   corrective surgery for crossed eyes   EYE SURGERY  1959   LEFT HEART CATH AND CORONARY ANGIOGRAPHY N/A 04/24/2019   Procedure: LEFT HEART CATH AND CORONARY ANGIOGRAPHY;  Surgeon: Martinique, Peter M, MD;  Location: [facility];  Service: Cardiovascular;  Laterality: N/A;   LEFT HEART CATH AND CORONARY ANGIOGRAPHY N/A 05/28/2020   Procedure: LEFT HEART CATH AND CORONARY ANGIOGRAPHY;  Surgeon: Burnell Blanks, MD;  Location: [facility];  Service: Cardiovascular;  Laterality: N/A;   LEFT HEART CATHETERIZATION WITH CORONARY ANGIOGRAM N/A 01/05/2012   Procedure: LEFT HEART CATHETERIZATION WITH CORONARY ANGIOGRAM;  Surgeon: Larey [facility], MD;  Location: [facility] CATH LAB;  Service: Cardiovascular;  Laterality: N/A;   ORIF WRIST FRACTURE Right 10/30/2020   Procedure: OPEN REDUCTION INTERNAL FIXATION (ORIF) WRIST FRACTURE;  Surgeon: Renette Butters, MD;  Location: [facility];  Service: Orthopedics;  Laterality: Right;   REMOVAL OF STONES  05/13/2019   Procedure: REMOVAL OF STONES;  Surgeon: Ronnette Juniper, MD;  Location: [facility] ENDOSCOPY;  Service: Gastroenterology;;   [name]  05/13/2019   Procedure: [name];  Surgeon: Ronnette Juniper, MD;  Location: [facility];  Service: Gastroenterology;;     reports that he has been smoking cigarettes. He has a 5.50 pack-year smoking history. He has never used smokeless tobacco. He reports current alcohol use of about 1.0 standard drink of alcohol per week. He reports that he does not currently use drugs after having used the following drugs: Cocaine.  No Known Allergies  Family History  Problem Relation Age of Onset   Heart attack Mother 79   Diabetes Mother    Heart disease Mother    Hyperlipidemia Father    Hypertension Father    Diabetes Maternal Grandfather    Heart disease Maternal Grandfather    Early death Maternal Grandfather    Heart attack Maternal Grandfather    Hearing loss Sister    Hyperlipidemia Sister    Depression Brother    Diabetes Brother    Hyperlipidemia Brother    Early death Maternal Grandmother    Heart disease Maternal Grandmother    Heart attack Maternal Grandmother    Hearing loss Sister     Prior to Admission medications   Medication Sig Start Date End Date Taking? Authorizing Provider  acetaminophen (TYLENOL) 500 MG tablet Take 1 tablet (500 mg total) by mouth every 6 (six) hours as needed for mild pain, fever or headache. 05/13/19    Arrien, Jimmy Picket, MD  albuterol (VENTOLIN HFA) 108 (90 Base) MCG/ACT inhaler Inhale 2 puffs into the lungs every 6 (six) hours as needed for wheezing or shortness of breath. 05/19/19   Copland, Gay Filler, MD  aspirin EC 81 MG tablet Take 81 mg by mouth daily at 6 PM.    [provider]  atorvastatin (LIPITOR) 80 MG tablet Take 1 tablet (80 mg total) by mouth daily at 6 PM. 05/21/20   Copland, Gay Filler, MD  carvedilol (COREG) 6.25 MG tablet Take 1 tablet (6.25 mg total) by mouth 2 (two) times daily. 05/28/20   Leanor Kail, PA  diphenhydrAMINE (BENADRYL) 25 mg capsule Take 25 mg by mouth as needed for allergies.    [provider]  doxycycline (VIBRAMYCIN) 100 MG capsule Take 1 capsule (100 mg total) by mouth 2 (  two) times daily. 12/10/20   Fatima Blank, MD  ezetimibe (ZETIA) 10 MG tablet Take 1 tablet (10 mg total) by mouth daily. Patient taking differently: Take 10 mg by mouth every evening. 05/21/20 10/27/22  Copland, Gay Filler, MD  furosemide (LASIX) 20 MG tablet Take 1 tablet (20 mg total) by mouth daily. Patient taking differently: Take 20 mg by mouth daily at 6 PM. 05/28/20   Bhagat, Bhavinkumar, PA  HYDROcodone-acetaminophen (NORCO) 10-325 MG tablet Take 1 tablet by mouth every 6 (six) hours as needed for severe pain. 10/30/20   Britt Bottom, PA-C  meloxicam (MOBIC) 15 MG tablet Take 1 tablet (15 mg total) by mouth daily as needed for pain (and inflammation). 10/30/20   Britt Bottom, PA-C  Multiple Vitamin (MULTIVITAMIN WITH MINERALS) TABS tablet Take 1 tablet by mouth daily at 6 PM.    [provider]  nitroGLYCERIN (NITROSTAT) 0.4 MG SL tablet Place 1 tablet (0.4 mg total) under the tongue every 5 (five) minutes as needed for chest pain. 05/21/20   Copland, Gay Filler, MD  ondansetron (ZOFRAN ODT) 4 MG disintegrating tablet Take 1 tablet (4 mg total) by mouth 2 (two) times daily as needed for nausea or vomiting. 10/30/20   Aggie Moats M, PA-C   sacubitril-valsartan (ENTRESTO) 24-26 MG Take 1 tablet by mouth 2 (two) times daily. 12/14/20   [name], PA-C  tamsulosin (FLOMAX) 0.4 MG CAPS capsule Take 1 capsule (0.4 mg total) by mouth daily. Patient taking differently: Take 0.4 mg by mouth every evening. 05/21/20   Copland, Gay Filler, MD    Physical Exam: Vitals:   04/29/22 1639 04/29/22 1640 04/29/22 1645 04/29/22 1923  BP: 120/79  138/83 (!) 154/74  Pulse:  74 75 79  Resp:   (!) 22 18  Temp:      TempSrc:      SpO2:  96% 96% 99%  Weight:      Height:        Constitutional: NAD, calm, comfortable Vitals:   04/29/22 1639 04/29/22 1640 04/29/22 1645 04/29/22 1923  BP: 120/79  138/83 (!) 154/74  Pulse:  74 75 79  Resp:   (!) 22 18  Temp:      TempSrc:      SpO2:  96% 96% 99%  Weight:      Height:       Eyes: PERRL, lids and conjunctivae normal ENMT: Mucous membranes are moist. Posterior pharynx clear of any exudate or lesions.Normal dentition.  Neck: normal, supple, no masses, no thyromegaly Respiratory: occasional expiratory wheezing heart on left, vesicular breath sounds,no crackles. Normal respiratory effort. No accessory muscle use.  Cardiovascular: Regular rate and rhythm, no murmurs / rubs / gallops. No extremity edema. 2+ pedal pulses.  Abdomen: no tenderness, no masses palpated. No hepatosplenomegaly. Bowel sounds positive.  Musculoskeletal: no clubbing / cyanosis. No joint deformity upper and lower extremities. Good ROM, no contractures. Normal muscle tone.  Skin: no rashes, lesions, ulcers. No induration Neurologic: CN 2-12 grossly intact. Sensation intact, DTR normal. Strength 5/5 in all 4.  Psychiatric: Normal judgment and insight. Alert and oriented x 3. Normal mood.    Labs on Admission: I have personally reviewed following labs and imaging studies  CBC: Recent Labs  Lab 04/29/22 1543  WBC 4.0  NEUTROABS 2.2  HGB 16.1  HCT 48.5  MCV 103.0*  PLT [id number]*   Basic Metabolic Panel: Recent  Labs  Lab 04/29/22 1543  NA 137  K 4.5  CL 102  CO2 26  GLUCOSE 114*  BUN 19  CREATININE 1.24  CALCIUM 9.1   GFR: Estimated Creatinine Clearance: 55.3 mL/min (by C-G formula based on SCr of 1.24 mg/dL). Liver Function Tests: Recent Labs  Lab 04/29/22 1543  AST 36  ALT 20  ALKPHOS 78  BILITOT 0.9  PROT 8.3*  ALBUMIN 4.3   No results for input(s): "LIPASE", "AMYLASE" in the last 168 hours. No results for input(s): "AMMONIA" in the last 168 hours. Coagulation Profile: Recent Labs  Lab 04/29/22 1543  INR 1.1   Cardiac Enzymes: No results for input(s): "CKTOTAL", "CKMB", "CKMBINDEX", "TROPONINI" in the last 168 hours. BNP (last 3 results) No results for input(s): "PROBNP" in the last 8760 hours. HbA1C: No results for input(s): "HGBA1C" in the last 72 hours. CBG: No results for input(s): "GLUCAP" in the last 168 hours. Lipid Profile: No results for input(s): "CHOL", "HDL", "LDLCALC", "TRIG", "CHOLHDL", "LDLDIRECT" in the last 72 hours. Thyroid Function Tests: No results for input(s): "TSH", "T4TOTAL", "FREET4", "T3FREE", "THYROIDAB" in the last 72 hours. Anemia Panel: No results for input(s): "VITAMINB12", "FOLATE", "FERRITIN", "TIBC", "IRON", "RETICCTPCT" in the last 72 hours. Urine analysis:    Component Value Date/Time   COLORURINE YELLOW 04/29/2022 [place] 04/29/2022 1605   LABSPEC 1.020 04/29/2022 1605   PHURINE 5.0 04/29/2022 1605   GLUCOSEU NEGATIVE 04/29/2022 1605   HGBUR NEGATIVE 04/29/2022 1605   BILIRUBINUR NEGATIVE 04/29/2022 1605   KETONESUR 5 (A) 04/29/2022 1605   PROTEINUR NEGATIVE 04/29/2022 1605   UROBILINOGEN 1.0 01/05/2012 1400   NITRITE NEGATIVE 04/29/2022 1605   LEUKOCYTESUR NEGATIVE 04/29/2022 1605    Radiological Exams on Admission: CT Angio Chest PE W and/or Wo Contrast  Result Date: 04/29/2022 CLINICAL DATA:  Shortness of breath and cough EXAM: CT ANGIOGRAPHY CHEST WITH CONTRAST TECHNIQUE: Multidetector CT imaging  of the chest was performed using the standard protocol during bolus administration of intravenous contrast. Multiplanar CT image reconstructions and MIPs were obtained to evaluate the vascular anatomy. RADIATION DOSE REDUCTION: This exam was performed according to the departmental dose-optimization program which includes automated exposure control, adjustment of the mA and/or kV according to patient size and/or use of iterative reconstruction technique. CONTRAST:  169m OMNIPAQUE IOHEXOL 350 MG/ML SOLN COMPARISON:  CT angiogram chest 05/27/2020 FINDINGS: Cardiovascular: Satisfactory opacification of the pulmonary arteries to the segmental level. No evidence of pulmonary embolism. Normal heart size. No pericardial effusion. There are atherosclerotic calcifications of the aorta and coronary arteries Mediastinum/Nodes: There is an enlarged subcarinal lymph node measuring 12 mm short axis. There is an enlarged right hilar lymph node measuring 13 mm short axis. Visualized esophagus and thyroid gland are within normal limits. Lungs/Pleura: Mild emphysematous changes are present. There is no focal lung infiltrate, pleural effusion or pneumothorax. There is minimal atelectasis in the lung bases. Trachea and central airways are patent. Upper Abdomen: No acute abnormality. Musculoskeletal: Degenerative changes affect the spine Review of the MIP images confirms the above findings. IMPRESSION: 1. No evidence for pulmonary embolism. 2. No acute cardiopulmonary process. 3. Mediastinal and right hilar lymphadenopathy of uncertain etiology. Aortic Atherosclerosis (ICD10-I70.0) and Emphysema (ICD10-J43.9). Electronically Signed   By: ARonney AstersM.D.   On: 04/29/2022 18:54   DG Chest 2 View  Result Date: 04/29/2022 CLINICAL DATA:  Sepsis. Shortness of breath, fever and productive cough for 3 days. EXAM: CHEST - 2 VIEW COMPARISON:  Chest x-ray 12/11/2020 FINDINGS: The cardiac silhouette, mediastinal and hilar contours are  within normal limits  and stable. The lungs are clear of an acute process. No infiltrates, edema or effusions. Stable underlying emphysematous changes and hyperinflation. No pulmonary lesions or pneumothorax. The bony thorax is intact. IMPRESSION: Emphysematous changes but no acute pulmonary findings. Electronically Signed   By: Marijo Sanes M.D.   On: 04/29/2022 16:42    EKG: Independently reviewed.   Assessment/Plan Acute COPD exacerbation with acute hypoxic respiratory failure  -admit to progressive care  - solumedrol iv , bid  with transition to oral prednisone - azithromycin daily x 5 days -f/u on sputum cultures  -- nebs standing and prn  -resume chronic inhalers  -pulmonary toilet  -wean O2 as able      CAD,s/p NSTEMI s/p DES -stable no current symptoms -resume carvedilol, and  Entresto   CHF rEF -EF 35% -currently well compensated  -continue on Entresto, lasix , carvediolol   Tobacco abuse  - states he currently smokes one cigarette per day -continue to encourage patient attempts at cessation   ETOH  -per patient he drink daily but small amount -will place on ciwa due to prior history   Hepatitis C -s/p treatment with Harvoni   Hyperlipidemia -continue statin     Hypertension Resume Entreso/carvedilol   CVA -continue on secondary ppx with asa  Thrombocytopenia mild not active issues    DVT prophylaxis: heparin plt `140 Code Status: full/ as discussed per patient wishes in event of cardiac arrest  Family Communication: none at bedside Disposition Plan: patient  expected to be admitted greater than 2 midnights  Consults called: n/a Admission status: progressive   Clance Boll MD [facility]   If 7PM-7AM, please contact night-coverage [email or web address] Password [facility]  04/29/2022, 8:02 PM

## 2022-04-29 NOTE — ED Triage Notes (Signed)
Patient brought in from home by EMS with c/o SOB. Patient states he has had SOB, fever and productive cough x3 days and he believes it is from specimens in his home. He has Hx of emphysema. He was 89% RA and EMS place patient on 3L O2 via [place] and gave '650mg'$  of Tylenol due to temp of 99.9. He has 20g in L hand. 150/90 100 99.9 89% RA 95% O2

## 2022-04-29 NOTE — ED Provider Notes (Signed)
[facility] Provider Note   CSN: [id number] Arrival date & time: 04/29/22  1529     History  Chief Complaint  Patient presents with   Shortness of Breath    Hunter Mcmillan is a 72 y.o. male with a past medical history significant for CAD, hypertension, hyperlipidemia, history of NSTEMI, chronic hepatitis C, tobacco abuse, COPD, and cirrhosis who presents to the ED due to productive cough, fever, and shortness of breath x 3 days.  Patient admits to clear sputum.  Notes his temperature has been around 99 at home.  Denies wheezing.  No associated chest pain.  Denies lower extremity edema.  Upon EMS arrival, patient found to be hypoxic at 89%.  Patient not on chronic oxygen.  Denies abdominal pain, nausea, vomiting, diarrhea. No sick contacts.   Patient concerned about a possible "allergic reaction" to his new living environment. Recently moved to a new placed and just started having difficulties breathing a few days ago.  History obtained from patient and past medical records. No interpreter used during encounter.       Home Medications Prior to Admission medications   Medication Sig Start Date End Date Taking? Authorizing Provider  acetaminophen (TYLENOL) 500 MG tablet Take 1 tablet (500 mg total) by mouth every 6 (six) hours as needed for mild pain, fever or headache. 05/13/19   Arrien, Jimmy Picket, MD  albuterol (VENTOLIN HFA) 108 (90 Base) MCG/ACT inhaler Inhale 2 puffs into the lungs every 6 (six) hours as needed for wheezing or shortness of breath. 05/19/19   Copland, Gay Filler, MD  aspirin EC 81 MG tablet Take 81 mg by mouth daily at 6 PM.    [provider]  atorvastatin (LIPITOR) 80 MG tablet Take 1 tablet (80 mg total) by mouth daily at 6 PM. 05/21/20   Copland, Gay Filler, MD  carvedilol (COREG) 6.25 MG tablet Take 1 tablet (6.25 mg total) by mouth 2 (two) times daily. 05/28/20   Leanor Kail, PA  diphenhydrAMINE  (BENADRYL) 25 mg capsule Take 25 mg by mouth as needed for allergies.    [provider]  doxycycline (VIBRAMYCIN) 100 MG capsule Take 1 capsule (100 mg total) by mouth 2 (two) times daily. 12/10/20   Fatima Blank, MD  ezetimibe (ZETIA) 10 MG tablet Take 1 tablet (10 mg total) by mouth daily. Patient taking differently: Take 10 mg by mouth every evening. 05/21/20 10/27/22  Copland, Gay Filler, MD  furosemide (LASIX) 20 MG tablet Take 1 tablet (20 mg total) by mouth daily. Patient taking differently: Take 20 mg by mouth daily at 6 PM. 05/28/20   Bhagat, Bhavinkumar, PA  HYDROcodone-acetaminophen (NORCO) 10-325 MG tablet Take 1 tablet by mouth every 6 (six) hours as needed for severe pain. 10/30/20   Britt Bottom, PA-C  meloxicam (MOBIC) 15 MG tablet Take 1 tablet (15 mg total) by mouth daily as needed for pain (and inflammation). 10/30/20   Britt Bottom, PA-C  Multiple Vitamin (MULTIVITAMIN WITH MINERALS) TABS tablet Take 1 tablet by mouth daily at 6 PM.    [provider]  nitroGLYCERIN (NITROSTAT) 0.4 MG SL tablet Place 1 tablet (0.4 mg total) under the tongue every 5 (five) minutes as needed for chest pain. 05/21/20   Copland, Gay Filler, MD  ondansetron (ZOFRAN ODT) 4 MG disintegrating tablet Take 1 tablet (4 mg total) by mouth 2 (two) times daily as needed for nausea or vomiting. 10/30/20   Britt Bottom,  PA-C  sacubitril-valsartan (ENTRESTO) 24-26 MG Take 1 tablet by mouth 2 (two) times daily. 12/14/20   [name], PA-C  tamsulosin (FLOMAX) 0.4 MG CAPS capsule Take 1 capsule (0.4 mg total) by mouth daily. Patient taking differently: Take 0.4 mg by mouth every evening. 05/21/20   Copland, Gay Filler, MD      Allergies    Patient has no known allergies.    Review of Systems   Review of Systems  Constitutional:  Positive for chills and fever.  Respiratory:  Positive for cough and shortness of breath.   Cardiovascular:  Negative for chest pain and leg  swelling.  All other systems reviewed and are negative.   Physical Exam Updated Vital Signs BP (!) 154/74   Pulse 79   Temp 98.6 F (37 C) (Oral)   Resp 18   Ht '5\' 11"'$  (1.803 m)   Wt 72.6 kg   SpO2 99%   BMI 22.32 kg/m  Physical Exam Vitals and nursing note reviewed.  Constitutional:      General: He is not in acute distress.    Appearance: He is not ill-appearing.  HENT:     Head: Normocephalic.  Eyes:     Pupils: Pupils are equal, round, and reactive to light.  Cardiovascular:     Rate and Rhythm: Normal rate and regular rhythm.     Pulses: Normal pulses.     Heart sounds: Normal heart sounds. No murmur heard.    No friction rub. No gallop.  Pulmonary:     Effort: Pulmonary effort is normal.     Breath sounds: Rhonchi and rales present.     Comments: On 3L [place] Abdominal:     General: Abdomen is flat. There is no distension.     Palpations: Abdomen is soft.     Tenderness: There is no abdominal tenderness. There is no guarding or rebound.  Musculoskeletal:        General: Normal range of motion.     Cervical back: Neck supple.     Comments: No lower extremity edema  Skin:    General: Skin is warm and dry.  Neurological:     General: No focal deficit present.     Mental Status: He is alert.  Psychiatric:        Mood and Affect: Mood normal.        Behavior: Behavior normal.     ED Results / Procedures / Treatments   Labs (all labs ordered are listed, but only abnormal results are displayed) Labs Reviewed  COMPREHENSIVE METABOLIC PANEL - Abnormal; Notable for the following components:      Result Value   Glucose, Bld 114 (*)    Total Protein 8.3 (*)    All other components within normal limits  CBC WITH DIFFERENTIAL/PLATELET - Abnormal; Notable for the following components:   MCV 103.0 (*)    MCH 34.2 (*)    Platelets 140 (*)    All other components within normal limits  URINALYSIS, ROUTINE W REFLEX MICROSCOPIC - Abnormal; Notable for the following  components:   Ketones, ur 5 (*)    All other components within normal limits  RESP PANEL BY RT-PCR (RSV, FLU A&B, COVID)  RVPGX2  CULTURE, BLOOD (ROUTINE X 2)  CULTURE, BLOOD (ROUTINE X 2)  LACTIC ACID, PLASMA  PROTIME-INR  APTT  LACTIC ACID, PLASMA  TROPONIN I (HIGH SENSITIVITY)  TROPONIN I (HIGH SENSITIVITY)    EKG EKG Interpretation  Date/Time:  Tuesday April 29 2022 15:41:31 EST Ventricular Rate:  92 PR Interval:  143 QRS Duration: 109 QT Interval:  391 QTC Calculation: 484 R Axis:   69 Text Interpretation: Sinus rhythm Probable inferior infarct, old No significant change since last tracing Confirmed by Fredia Sorrow [phone] on 04/29/2022 4:23:24 PM  Radiology CT Angio Chest PE W and/or Wo Contrast  Result Date: 04/29/2022 CLINICAL DATA:  Shortness of breath and cough EXAM: CT ANGIOGRAPHY CHEST WITH CONTRAST TECHNIQUE: Multidetector CT imaging of the chest was performed using the standard protocol during bolus administration of intravenous contrast. Multiplanar CT image reconstructions and MIPs were obtained to evaluate the vascular anatomy. RADIATION DOSE REDUCTION: This exam was performed according to the departmental dose-optimization program which includes automated exposure control, adjustment of the mA and/or kV according to patient size and/or use of iterative reconstruction technique. CONTRAST:  163m OMNIPAQUE IOHEXOL 350 MG/ML SOLN COMPARISON:  CT angiogram chest 05/27/2020 FINDINGS: Cardiovascular: Satisfactory opacification of the pulmonary arteries to the segmental level. No evidence of pulmonary embolism. Normal heart size. No pericardial effusion. There are atherosclerotic calcifications of the aorta and coronary arteries Mediastinum/Nodes: There is an enlarged subcarinal lymph node measuring 12 mm short axis. There is an enlarged right hilar lymph node measuring 13 mm short axis. Visualized esophagus and thyroid gland are within normal limits. Lungs/Pleura: Mild  emphysematous changes are present. There is no focal lung infiltrate, pleural effusion or pneumothorax. There is minimal atelectasis in the lung bases. Trachea and central airways are patent. Upper Abdomen: No acute abnormality. Musculoskeletal: Degenerative changes affect the spine Review of the MIP images confirms the above findings. IMPRESSION: 1. No evidence for pulmonary embolism. 2. No acute cardiopulmonary process. 3. Mediastinal and right hilar lymphadenopathy of uncertain etiology. Aortic Atherosclerosis (ICD10-I70.0) and Emphysema (ICD10-J43.9). Electronically Signed   By: ARonney AstersM.D.   On: 04/29/2022 18:54   DG Chest 2 View  Result Date: 04/29/2022 CLINICAL DATA:  Sepsis. Shortness of breath, fever and productive cough for 3 days. EXAM: CHEST - 2 VIEW COMPARISON:  Chest x-ray 12/11/2020 FINDINGS: The cardiac silhouette, mediastinal and hilar contours are within normal limits and stable. The lungs are clear of an acute process. No infiltrates, edema or effusions. Stable underlying emphysematous changes and hyperinflation. No pulmonary lesions or pneumothorax. The bony thorax is intact. IMPRESSION: Emphysematous changes but no acute pulmonary findings. Electronically Signed   By: PMarijo SanesM.D.   On: 04/29/2022 16:42    Procedures .Critical Care  Performed by: ASuzy Bouchard PA-C Authorized by: ASuzy Bouchard PA-C   Critical care provider statement:    Critical care time (minutes):  33   Critical care was necessary to treat or prevent imminent or life-threatening deterioration of the following conditions:  Respiratory failure   Critical care was time spent personally by me on the following activities:  Development of treatment plan with patient or surrogate, discussions with consultants, evaluation of patient's response to treatment, examination of patient, ordering and review of laboratory studies, ordering and review of radiographic studies, ordering and performing  treatments and interventions, pulse oximetry, re-evaluation of patient's condition and review of old charts   I assumed direction of critical care for this patient from another provider in my specialty: no     Care discussed with: admitting provider       Medications Ordered in ED Medications  lactated ringers bolus 1,000 mL (0 mLs Intravenous Stopped 04/29/22 1736)  ipratropium-albuterol (DUONEB) 0.5-2.5 (3) MG/3ML nebulizer solution 3 mL (3 mLs Nebulization Given  04/29/22 1736)  iohexol (OMNIPAQUE) 350 MG/ML injection 100 mL (100 mLs Intravenous Contrast Given 04/29/22 1833)  methylPREDNISolone sodium succinate (SOLU-MEDROL) 125 mg/2 mL injection 125 mg (125 mg Intravenous Given 04/29/22 1911)    ED Course/ Medical Decision Making/ A&P Clinical Course as of 04/29/22 1945  Tue Apr 29, 2022  1557 SpO2(!): 89 % [CA]    Clinical Course User Index [CA] Suzy Bouchard, PA-C                             Medical Decision Making Amount and/or Complexity of Data Reviewed Independent Historian: EMS Labs: ordered. Decision-making details documented in ED Course. Radiology: ordered and independent interpretation performed. Decision-making details documented in ED Course. ECG/medicine tests: ordered and independent interpretation performed. Decision-making details documented in ED Course.  Risk Prescription drug management. Decision regarding hospitalization.   This patient presents to the ED for concern of SOB, this involves an extensive number of treatment options, and is a complaint that carries with it a high risk of complications and morbidity.  The differential diagnosis includes PNA, viral process, PE, ACS, etc  72 year old male presents the ED due to shortness of breath, productive cough, and fever x 3 days.  History of COPD.  Upon arrival patient afebrile, tachypneic and hypoxic at 89%.  Patient not on chronic oxygen at home.  Physical exam significant for rales and rhonchi.  No  wheeze.  No lower extremity edema.  Sepsis labs ordered.  Troponin to rule out ACS. CXR to rule out pneumonia. RPV. IV fluids given.  Patient given Tylenol by EMS prior to arrival.  CBC reassuring.  No leukocytosis.  Normal hemoglobin.  Mild thrombocytopenia at 140.  CMP reassuring.  Normal renal function.  No major electrolyte derangements.  RVP negative.  UA negative for signs of infection.  Troponin normal.  EKG demonstrates normal sinus rhythm.  No signs of acute ischemia.  Low suspicion for ACS.  Chest x-ray personally reviewed and interpreted which is negative for signs of pneumonia.  Does demonstrate changes consistent with emphysema.  Given hypoxia, CTA ordered to rule out evidence of PE and to rule out evidence of pneumonia.  DuoNeb given.  CTA negative for PE or other cardiopulmonary process.  Does demonstrate mediastinal and right hilar lymphadenopathy of unknown etiology.  7:08 PM reassessed patient at bedside.  Lungs clear to auscultation bilaterally without wheeze.  Patient notes some improvement in shortness of breath after DuoNeb.  Patient still requiring oxygen.  Will consult hospitalist for admission.  Solu-Medrol given.  Possible URI causing symptoms.  Discussed with Dr. Rogene Houston who agrees with assessment and plan.   7:44 PM Discussed with Dr. Marcello Moores with [facility] who agrees to admit patient for further treatment.         Final Clinical Impression(s) / ED Diagnoses Final diagnoses:  Hypoxia    Rx / DC Orders ED Discharge Orders     None         Karie Kirks 04/29/22 1946    Fredia Sorrow, MD 04/30/22 [phone]

## 2022-04-29 NOTE — ED Notes (Signed)
ED TO INPATIENT HANDOFF REPORT  Name/Age/Gender Hunter Mcmillan 72 y.o. male  Code Status Code Status History     Date Active Date Inactive Code Status Order ID Comments User Context   05/26/2020 1226 05/28/2020 2309 Full Code [id number]  Cheryln Manly, NP Inpatient   05/09/2019 0403 05/13/2019 2143 Full Code [id number]  Phillips Grout, MD ED   04/30/2019 1348 05/03/2019 2036 Full Code [id number]  Lequita Halt, MD ED   04/24/2019 1027 04/26/2019 1722 Full Code [id number]  [place], Peter M, MD Inpatient   04/24/2019 1027 04/24/2019 1027 Full Code [id number]  [place], Peter M, MD Inpatient   07/16/2017 2142 07/17/2017 1742 Full Code [id number]  Sharlett Iles, MD ED   06/15/2014 2202 06/17/2014 1815 Full Code [id number]  Dahlia Bailiff, PA-C ED   11/17/2013 2331 11/18/2013 1640 Full Code [id number]  Mackie Pai., MD ED       Home/SNF/Other Home  Chief Complaint Acute exacerbation of chronic obstructive pulmonary disease (COPD) ([place]) [J44.1]  Level of Care/Admitting Diagnosis ED Disposition     ED Disposition  Admit   Condition  --   Comment  Hospital Area: [facility] [[id number]]  Level of Care: Progressive [102]  Admit to Progressive based on following criteria: RESPIRATORY PROBLEMS hypoxemic/hypercapnic respiratory failure that is responsive to NIPPV (BiPAP) or High Flow Nasal Cannula (6-80 lpm). Frequent assessment/intervention, no > Q2 hrs < Q4 hrs, to maintain oxygenation and pulmonary hygiene.  May admit patient to [facility] or [facility] if equivalent level of care is available:: No  Covid Evaluation: Confirmed COVID Negative  Diagnosis: Acute exacerbation of chronic obstructive pulmonary disease (COPD) [facility]) [id number]  Admitting Physician: Clance Boll [id number]  Attending Physician: Clance Boll [id number]  Certification:: I certify this patient will need inpatient services for at least 2 midnights  Estimated Length of Stay: 3           Medical History Past Medical History:  Diagnosis Date   Abdominal pain 05/02/2019   Alcoholism ([place])    CAD (coronary artery disease)    a. 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 s/p [name] ~ 2003 in [place], [place] => stopped after 6 mos due to neutropenia - viral load noted to be low at that time;  no follow up since;   b. abdominal u/s 11/13:  diffuse hepatic steatosis and/or hepatocellular disease     Hx of echocardiogram    a. Echo 01/06/12: Mild focal basal hypertrophy the septum, vigorous LV, EF 65-70%, Gr 2 diast dysfn, trivial AI, MAC, trivial MR, trivial TR, PASP 36   Hyperlipidemia    Hypertension    Non-STEMI (non-ST elevated myocardial infarction) ([place]) 2019   Stroke ([place]) 2019   "minor" pt reported   Thrombocytopenia ([place])    Tobacco abuse     Allergies No Known Allergies  IV Location/Drains/Wounds Patient Lines/Drains/Airways Status  Active Line/Drains/Airways     Name Placement date Placement time Site Days   Peripheral IV 04/29/22 20 G Posterior;Left Hand 04/29/22  1530  Hand  less than 1   Peripheral IV 04/29/22 20 G 1" Anterior;Distal;Right;Upper Arm 04/29/22  1540  Arm  less than 1   Incision (Closed) 10/30/20 Arm Right 10/30/20  1102  -- 546            Labs/Imaging Results for orders placed or performed during the hospital encounter of 04/29/22 (from the past 48 hour(s))  Comprehensive metabolic panel     Status: Abnormal   Collection Time: 04/29/22  3:43 PM   Result Value Ref Range   Sodium 137 135 - 145 mmol/L   Potassium 4.5 3.5 - 5.1 mmol/L   Chloride 102 98 - 111 mmol/L   CO2 26 22 - 32 mmol/L   Glucose, Bld 114 (H) 70 - 99 mg/dL    Comment: Glucose reference range applies only to samples taken after fasting for at least 8 hours.   BUN 19 8 - 23 mg/dL   Creatinine, Ser 1.24 0.61 - 1.24 mg/dL   Calcium 9.1 8.9 - 10.3 mg/dL   Total Protein 8.3 (H) 6.5 - 8.1 g/dL   Albumin 4.3 3.5 - 5.0 g/dL   AST 36 15 - 41 U/L   ALT 20 0 - 44 U/L   Alkaline Phosphatase 78 38 - 126 U/L   Total Bilirubin 0.9 0.3 - 1.2 mg/dL   GFR, Estimated >60 >60 mL/min    Comment: (NOTE) Calculated using the CKD-EPI Creatinine Equation (2021)    Anion gap 9 5 - 15    Comment: Performed at [facility], [place] [address], [place], [place] [address]  Lactic acid, plasma     Status: None   Collection Time: 04/29/22  3:43 PM  Result Value Ref Range   Lactic Acid, Venous 1.7 0.5 - 1.9 mmol/L    Comment: Performed at [facility], [place] [address] [place], [place] [address]  CBC with Differential     Status: Abnormal   Collection Time: 04/29/22  3:43 PM  Result Value Ref Range   WBC 4.0 4.0 - 10.5 K/uL   RBC 4.71 4.22 - 5.81 MIL/uL   Hemoglobin 16.1 13.0 - 17.0 g/dL   HCT 48.5 39.0 - 52.0 %   MCV 103.0 (H) 80.0 - 100.0 fL   MCH 34.2 (H) 26.0 - 34.0 pg   MCHC 33.2 30.0 - 36.0 g/dL   RDW 13.5 11.5 - 15.5 %   Platelets 140 (L) 150 - 400 K/uL   nRBC 0.0 0.0 - 0.2 %   Neutrophils Relative % 57 %   Neutro Abs 2.2 1.7 - 7.7 K/uL   Lymphocytes Relative 25 %   Lymphs Abs 1.0 0.7 - 4.0 K/uL   Monocytes Relative 13 %   Monocytes Absolute 0.5 0.1 - 1.0 K/uL   Eosinophils Relative 4 %   Eosinophils Absolute 0.2 0.0 - 0.5 K/uL   Basophils Relative 1 %   Basophils Absolute 0.0 0.0 - 0.1 K/uL   Immature Granulocytes 0 %   Abs Immature Granulocytes 0.01 0.00 - 0.07 K/uL    Comment: Performed at [facility], [place]  [address] [place], [place] [address]  Protime-INR     Status: None   Collection Time: 04/29/22  3:43 PM  Result Value Ref Range   Prothrombin Time 13.8 11.4 - 15.2 seconds   INR 1.1 0.8 -  1.2    Comment: (NOTE) INR goal varies based on device and disease states. Performed at [facility], [place] [address], [place], [place] [address]   Resp panel by RT-PCR (RSV, Flu A&B, Covid) Anterior Nasal Swab     Status: None   Collection Time: 04/29/22  3:43 PM   Specimen: Anterior Nasal Swab  Result Value Ref Range   SARS Coronavirus 2 by RT PCR NEGATIVE NEGATIVE    Comment: (NOTE) SARS-CoV-2 target nucleic acids are NOT DETECTED.  The SARS-CoV-2 RNA is generally detectable in upper respiratory specimens during the acute phase of infection. The lowest concentration of SARS-CoV-2 viral copies this assay can detect is 138 copies/mL. A negative result does not preclude SARS-Cov-2 infection and should not be used as the sole basis for treatment or other patient management decisions. A negative result may occur with  improper specimen collection/handling, submission of specimen other than nasopharyngeal swab, presence of viral mutation(s) within the areas targeted by this assay, and inadequate number of viral copies(<138 copies/mL). A negative result must be combined with clinical observations, patient history, and epidemiological information. The expected result is Negative.  Fact Sheet for Patients:  EntrepreneurPulse.com.au  Fact Sheet for Healthcare Providers:  IncredibleEmployment.be  This test is no t yet approved or cleared by the [place] FDA and  has been authorized for detection and/or diagnosis of SARS-CoV-2 by FDA under an Emergency Use Authorization (EUA). This EUA will remain  in effect (meaning this test can be used) for the duration of the COVID-19 declaration under Section 564(b)(1) of the Act, 21 U.S.C.section  360bbb-3(b)(1), unless the authorization is terminated  or revoked sooner.       Influenza A by PCR NEGATIVE NEGATIVE   Influenza B by PCR NEGATIVE NEGATIVE    Comment: (NOTE) The Xpert Xpress SARS-CoV-2/FLU/RSV plus assay is intended as an aid in the diagnosis of influenza from Nasopharyngeal swab specimens and should not be used as a sole basis for treatment. Nasal washings and aspirates are unacceptable for Xpert Xpress SARS-CoV-2/FLU/RSV testing.  Fact Sheet for Patients: EntrepreneurPulse.com.au  Fact Sheet for Healthcare Providers: IncredibleEmployment.be  This test is not yet approved or cleared by the [place] FDA and has been authorized for detection and/or diagnosis of SARS-CoV-2 by FDA under an Emergency Use Authorization (EUA). This EUA will remain in effect (meaning this test can be used) for the duration of the COVID-19 declaration under Section 564(b)(1) of the Act, 21 U.S.C. section 360bbb-3(b)(1), unless the authorization is terminated or revoked.     Resp Syncytial Virus by PCR NEGATIVE NEGATIVE    Comment: (NOTE) Fact Sheet for Patients: EntrepreneurPulse.com.au  Fact Sheet for Healthcare Providers: IncredibleEmployment.be  This test is not yet approved or cleared by the [place] FDA and has been authorized for detection and/or diagnosis of SARS-CoV-2 by FDA under an Emergency Use Authorization (EUA). This EUA will remain in effect (meaning this test can be used) for the duration of the COVID-19 declaration under Section 564(b)(1) of the Act, 21 U.S.C. section 360bbb-3(b)(1), unless the authorization is terminated or revoked.  Performed at [facility], [place] [address] [place], [place] [address]   Culture, blood (Routine x 2)     Status: None (Preliminary result)   Collection Time: 04/29/22  3:48 PM   Specimen: BLOOD LEFT HAND  Result Value Ref Range    Specimen Description      BLOOD LEFT HAND Performed at [facility], [place] [address]  [place], [place] [address]    Special Requests      BOTTLES DRAWN AEROBIC AND ANAEROBIC Blood Culture adequate volume Performed at [place] [address] [place], [place] [address]    Culture PENDING    Report Status PENDING   Urinalysis, Routine w reflex microscopic -Urine, Clean Catch     Status: Abnormal   Collection Time: 04/29/22  4:05 PM  Result Value Ref Range   Color, Urine YELLOW YELLOW   APPearance CLEAR CLEAR   Specific Gravity, Urine 1.020 1.005 - 1.030   pH 5.0 5.0 - 8.0   Glucose, UA NEGATIVE NEGATIVE mg/dL   Hgb urine dipstick NEGATIVE NEGATIVE   Bilirubin Urine NEGATIVE NEGATIVE   Ketones, ur 5 (A) NEGATIVE mg/dL   Protein, ur NEGATIVE NEGATIVE mg/dL   Nitrite NEGATIVE NEGATIVE   Leukocytes,Ua NEGATIVE NEGATIVE    Comment: Performed at [facility], [place] [address], [place], [place] [address]  APTT     Status: None   Collection Time: 04/29/22  4:42 PM  Result Value Ref Range   aPTT 32 24 - 36 seconds    Comment: Performed at [facility], [place] [address], [place], [place] [address]  Troponin I (High Sensitivity)     Status: None   Collection Time: 04/29/22  4:42 PM  Result Value Ref Range   Troponin I (High Sensitivity) 11 <18 ng/L    Comment: (NOTE) Elevated high sensitivity troponin I (hsTnI) values and significant  changes across serial measurements may suggest ACS but many other  chronic and acute conditions are known to elevate hsTnI results.  Refer to the "Links" section for chest pain algorithms and additional  guidance. Performed at [facility], [place] [address], [place], [place] [address]   Troponin I (High Sensitivity)     Status: None   Collection Time: 04/29/22  6:52 PM  Result Value Ref Range   Troponin I (High Sensitivity) 9 <18 ng/L    Comment: (NOTE) Elevated high  sensitivity troponin I (hsTnI) values and significant  changes across serial measurements may suggest ACS but many other  chronic and acute conditions are known to elevate hsTnI results.  Refer to the "Links" section for chest pain algorithms and additional  guidance. Performed at [facility], [place] [address] [place], [place] [address]    CT Angio Chest PE W and/or Wo Contrast  Result Date: 04/29/2022 CLINICAL DATA:  Shortness of breath and cough EXAM: CT ANGIOGRAPHY CHEST WITH CONTRAST TECHNIQUE: Multidetector CT imaging of the chest was performed using the standard protocol during bolus administration of intravenous contrast. Multiplanar CT image reconstructions and MIPs were obtained to evaluate the vascular anatomy. RADIATION DOSE REDUCTION: This exam was performed according to the departmental dose-optimization program which includes automated exposure control, adjustment of the mA and/or kV according to patient size and/or use of iterative reconstruction technique. CONTRAST:  123m OMNIPAQUE IOHEXOL 350 MG/ML SOLN COMPARISON:  CT angiogram chest 05/27/2020 FINDINGS: Cardiovascular: Satisfactory opacification of the pulmonary arteries to the segmental level. No evidence of pulmonary embolism. Normal heart size. No pericardial effusion. There are atherosclerotic calcifications of the aorta and coronary arteries Mediastinum/Nodes: There is an enlarged subcarinal lymph node measuring 12 mm short axis. There is an enlarged right hilar lymph node measuring 13 mm short axis. Visualized esophagus and thyroid gland are within normal limits. Lungs/Pleura: Mild emphysematous changes are present. There is no focal lung infiltrate, pleural effusion or pneumothorax. There is minimal  atelectasis in the lung bases. Trachea and central airways are patent. Upper Abdomen: No acute abnormality. Musculoskeletal: Degenerative changes affect the spine Review of the MIP images confirms the above  findings. IMPRESSION: 1. No evidence for pulmonary embolism. 2. No acute cardiopulmonary process. 3. Mediastinal and right hilar lymphadenopathy of uncertain etiology. Aortic Atherosclerosis (ICD10-I70.0) and Emphysema (ICD10-J43.9). Electronically Signed   By: Ronney Asters M.D.   On: 04/29/2022 18:54   DG Chest 2 View  Result Date: 04/29/2022 CLINICAL DATA:  Sepsis. Shortness of breath, fever and productive cough for 3 days. EXAM: CHEST - 2 VIEW COMPARISON:  Chest x-ray 12/11/2020 FINDINGS: The cardiac silhouette, mediastinal and hilar contours are within normal limits and stable. The lungs are clear of an acute process. No infiltrates, edema or effusions. Stable underlying emphysematous changes and hyperinflation. No pulmonary lesions or pneumothorax. The bony thorax is intact. IMPRESSION: Emphysematous changes but no acute pulmonary findings. Electronically Signed   By: Marijo Sanes M.D.   On: 04/29/2022 16:42    Pending Labs Unresulted Labs (From admission, onward)     Start     Ordered   04/29/22 1605  Lactic acid, plasma  Now then every 2 hours,   R (with STAT occurrences)      04/29/22 1604   04/29/22 1605  Culture, blood (Routine x 2)  BLOOD CULTURE X 2,   R (with STAT occurrences)      04/29/22 1604            Vitals/Pain Today's Vitals   04/29/22 1639 04/29/22 1640 04/29/22 1645 04/29/22 1923  BP: 120/79  138/83 (!) 154/74  Pulse:  74 75 79  Resp:   (!) 22 18  Temp:      TempSrc:      SpO2:  96% 96% 99%  Weight:      Height:      PainSc:        Isolation Precautions No active isolations  Medications Medications  lactated ringers bolus 1,000 mL (0 mLs Intravenous Stopped 04/29/22 1736)  ipratropium-albuterol (DUONEB) 0.5-2.5 (3) MG/3ML nebulizer solution 3 mL (3 mLs Nebulization Given 04/29/22 1736)  iohexol (OMNIPAQUE) 350 MG/ML injection 100 mL (100 mLs Intravenous Contrast Given 04/29/22 1833)  methylPREDNISolone sodium succinate (SOLU-MEDROL) 125 mg/2 mL  injection 125 mg (125 mg Intravenous Given 04/29/22 1911)    Mobility walks with person assist

## 2022-04-29 NOTE — ED Notes (Signed)
Patient transported to CT 

## 2022-04-30 DIAGNOSIS — J441 Chronic obstructive pulmonary disease with (acute) exacerbation: Secondary | ICD-10-CM | POA: Diagnosis not present

## 2022-04-30 LAB — BLOOD CULTURE ID PANEL (REFLEXED) - BCID2

## 2022-04-30 LAB — RESPIRATORY PANEL BY PCR

## 2022-04-30 LAB — CBC
HCT: 44.5 % (ref 39.0–52.0)
Hemoglobin: 15 g/dL (ref 13.0–17.0)
MCH: 34.2 pg — ABNORMAL HIGH (ref 26.0–34.0)
MCHC: 33.7 g/dL (ref 30.0–36.0)
MCV: 101.4 fL — ABNORMAL HIGH (ref 80.0–100.0)
Platelets: 118 10*3/uL — ABNORMAL LOW (ref 150–400)
RBC: 4.39 MIL/uL (ref 4.22–5.81)
RDW: 13.4 % (ref 11.5–15.5)
WBC: 3.1 10*3/uL — ABNORMAL LOW (ref 4.0–10.5)
nRBC: 0 % (ref 0.0–0.2)

## 2022-04-30 LAB — COMPREHENSIVE METABOLIC PANEL
ALT: 18 U/L (ref 0–44)
AST: 31 U/L (ref 15–41)
Albumin: 3.9 g/dL (ref 3.5–5.0)
Alkaline Phosphatase: 68 U/L (ref 38–126)
Anion gap: 10 (ref 5–15)
BUN: 18 mg/dL (ref 8–23)
CO2: 23 mmol/L (ref 22–32)
Calcium: 8.5 mg/dL — ABNORMAL LOW (ref 8.9–10.3)
Chloride: 101 mmol/L (ref 98–111)
Creatinine, Ser: 1.12 mg/dL (ref 0.61–1.24)
GFR, Estimated: >60 mL/min (ref >60)
Glucose, Bld: 188 mg/dL — ABNORMAL HIGH (ref 70–99)
Potassium: 3.8 mmol/L (ref 3.5–5.1)
Sodium: 134 mmol/L — ABNORMAL LOW (ref 135–145)
Total Bilirubin: 0.8 mg/dL (ref 0.3–1.2)
Total Protein: 7 g/dL (ref 6.5–8.1)

## 2022-04-30 LAB — EXPECTORATED SPUTUM ASSESSMENT W GRAM STAIN, RFLX TO RESP C

## 2022-04-30 LAB — BRAIN NATRIURETIC PEPTIDE: B Natriuretic Peptide: 140.1 pg/mL — ABNORMAL HIGH (ref 0.0–100.0)

## 2022-04-30 LAB — LACTIC ACID, PLASMA: Lactic Acid, Venous: 2.1 mmol/L (ref 0.5–1.9)

## 2022-04-30 MED ORDER — GUAIFENESIN-DM 100-10 MG/5ML PO SYRP
5.0000 mL | ORAL_SOLUTION | ORAL | Status: DC | PRN
  Administered 2022-04-30: 5 mL via ORAL
  Filled 2022-04-30: qty 10

## 2022-04-30 MED ORDER — SACUBITRIL-VALSARTAN 24-26 MG PO TABS
1.0000 | ORAL_TABLET | Freq: Two times a day (BID) | ORAL | Status: DC
  Administered 2022-04-30 – 2022-05-02 (×5): 1 via ORAL
  Filled 2022-04-30 (×6): qty 1

## 2022-04-30 MED ORDER — ATORVASTATIN CALCIUM 40 MG PO TABS
80.0000 mg | ORAL_TABLET | Freq: Every day | ORAL | Status: DC
  Administered 2022-04-30 – 2022-05-01 (×2): 80 mg via ORAL
  Filled 2022-04-30 (×2): qty 2

## 2022-04-30 MED ORDER — TAMSULOSIN HCL 0.4 MG PO CAPS
0.4000 mg | ORAL_CAPSULE | Freq: Every evening | ORAL | Status: DC
  Administered 2022-04-30 – 2022-05-01 (×2): 0.4 mg via ORAL
  Filled 2022-04-30 (×2): qty 1

## 2022-04-30 MED ORDER — DM-GUAIFENESIN ER 30-600 MG PO TB12
1.0000 | ORAL_TABLET | Freq: Two times a day (BID) | ORAL | Status: DC
  Administered 2022-04-30 – 2022-05-02 (×5): 1 via ORAL
  Filled 2022-04-30 (×5): qty 1

## 2022-04-30 MED ORDER — CARVEDILOL 6.25 MG PO TABS
6.2500 mg | ORAL_TABLET | Freq: Two times a day (BID) | ORAL | Status: DC
  Administered 2022-04-30 – 2022-05-02 (×5): 6.25 mg via ORAL
  Filled 2022-04-30 (×5): qty 1

## 2022-04-30 MED ORDER — ASPIRIN 81 MG PO TBEC
81.0000 mg | DELAYED_RELEASE_TABLET | Freq: Every day | ORAL | Status: DC
  Administered 2022-04-30 – 2022-05-01 (×2): 81 mg via ORAL
  Filled 2022-04-30 (×2): qty 1

## 2022-04-30 MED ORDER — LACTATED RINGERS IV BOLUS
250.0000 mL | Freq: Once | INTRAVENOUS | Status: AC
  Administered 2022-04-30: 250 mL via INTRAVENOUS

## 2022-04-30 NOTE — Progress Notes (Signed)
PHARMACY - PHYSICIAN COMMUNICATION CRITICAL VALUE ALERT - BLOOD CULTURE IDENTIFICATION (BCID)  Hunter Mcmillan is an 72 y.o. male who presented to [facility] on 04/29/2022 with COPD exacerbation  Assessment: BCID + for staph epidermidis (no methicillin resistance) in 1 out of 4 bottles.  Afebrile.  WBC 3.1.  Likely contaminant  Name of physician (or Provider) Contacted: Raenette Rover, FNP  Current antibiotics: Azithromycin  Changes to prescribed antibiotics recommended:  Patient is on recommended antibiotics - No changes needed  Results for orders placed or performed during the hospital encounter of 04/29/22  Blood Culture ID Panel (Reflexed) (Collected: 04/29/2022  3:48 PM)  Result Value Ref Range   Enterococcus faecalis NOT DETECTED NOT DETECTED   Enterococcus Faecium NOT DETECTED NOT DETECTED   Listeria monocytogenes NOT DETECTED NOT DETECTED   Staphylococcus species DETECTED (A) NOT DETECTED   Staphylococcus aureus (BCID) NOT DETECTED NOT DETECTED   Staphylococcus epidermidis DETECTED (A) NOT DETECTED   Staphylococcus lugdunensis NOT DETECTED NOT DETECTED   Streptococcus species NOT DETECTED NOT DETECTED   Streptococcus agalactiae NOT DETECTED NOT DETECTED   Streptococcus pneumoniae NOT DETECTED NOT DETECTED   Streptococcus pyogenes NOT DETECTED NOT DETECTED   A.calcoaceticus-baumannii NOT DETECTED NOT DETECTED   Bacteroides fragilis NOT DETECTED NOT DETECTED   Enterobacterales NOT DETECTED NOT DETECTED   Enterobacter cloacae complex NOT DETECTED NOT DETECTED   Escherichia coli NOT DETECTED NOT DETECTED   Klebsiella aerogenes NOT DETECTED NOT DETECTED   Klebsiella oxytoca NOT DETECTED NOT DETECTED   Klebsiella pneumoniae NOT DETECTED NOT DETECTED   Proteus species NOT DETECTED NOT DETECTED   Salmonella species NOT DETECTED NOT DETECTED   Serratia marcescens NOT DETECTED NOT DETECTED   Haemophilus influenzae NOT DETECTED NOT DETECTED   Neisseria meningitidis NOT DETECTED  NOT DETECTED   Pseudomonas aeruginosa NOT DETECTED NOT DETECTED   Stenotrophomonas maltophilia NOT DETECTED NOT DETECTED   Candida albicans NOT DETECTED NOT DETECTED   Candida auris NOT DETECTED NOT DETECTED   Candida glabrata NOT DETECTED NOT DETECTED   Candida krusei NOT DETECTED NOT DETECTED   Candida parapsilosis NOT DETECTED NOT DETECTED   Candida tropicalis NOT DETECTED NOT DETECTED   Cryptococcus neoformans/gattii NOT DETECTED NOT DETECTED   Methicillin resistance mecA/C NOT DETECTED NOT DETECTED    Everette Rank, PharmD 04/30/2022  10:57 PM

## 2022-04-30 NOTE — TOC Initial Note (Signed)
Transition of Care [facility]) - Initial/Assessment Note    Patient Details  Name: Hunter Mcmillan MRN: [id number] Date of Birth: [birth year]  Transition of Care [facility]) CM/SW Contact:    Dessa Phi, RN Phone Number: 04/30/2022, 11:47 AM  Clinical Narrative:d/c plan home. HFNC-monitor on 02.                  Expected Discharge Plan: Home/Self Care Barriers to Discharge: Continued Medical Work up   Patient Goals and CMS Choice Patient states their goals for this hospitalization and ongoing recovery are::  (Home)          Expected Discharge Plan and Services   Discharge Planning Services: CM Consult   Living arrangements for the past 2 months: Single Family Home                                      Prior Living Arrangements/Services Living arrangements for the past 2 months: Single Family Home Lives with:: Friends                   Activities of Daily Living Home Assistive Devices/Equipment: None ADL Screening (condition at time of admission) Patient's cognitive ability adequate to safely complete daily activities?: Yes Is the patient deaf or have difficulty hearing?: No Does the patient have difficulty seeing, even when wearing glasses/contacts?: No Does the patient have difficulty concentrating, remembering, or making decisions?: No Patient able to express need for assistance with ADLs?: Yes Does the patient have difficulty dressing or bathing?: No Independently performs ADLs?: Yes (appropriate for developmental age) Does the patient have difficulty walking or climbing stairs?: No Weakness of Legs: None Weakness of Arms/Hands: None  Permission Sought/Granted                  Emotional Assessment              Admission diagnosis:  Acute exacerbation of chronic obstructive pulmonary disease (COPD) ([place]) [J44.1] Hypoxia [R09.02] Patient Active Problem List   Diagnosis Date Noted   Acute exacerbation of chronic obstructive pulmonary disease  (COPD) ([place]) 04/29/2022   Precordial chest pain    Unstable angina ([employer]) 05/26/2020   Prediabetes 05/22/2020   Acute cholecystitis 05/09/2019   Cholelithiasis 04/30/2019   RUQ abdominal pain    Presence of drug-eluting stent in right coronary artery 04/25/2019   Hyperlipidemia with target LDL less than 70 04/25/2019   Acute inferior STEMI 04/25/2019   STEMI involving right coronary artery ([place]) 04/24/2019   Abdominal aortic aneurysm (AAA) 3.0 cm to 5.5 cm in diameter in male ([place]) 01/06/2017   Essential hypertension 04/09/2015   Hyperpigmentation of skin 10/05/2014   COPD (chronic obstructive pulmonary disease) ([place]) 06/26/2014   Sacroiliac joint dysfunction of right side 06/26/2014   Alcoholism ([place])    Cocaine abuse ([place]) 11/18/2013   Scrotal mass 07/21/2013   Cirrhosis ([place]) 05/28/2013   Routine general medical examination at a health care facility 07/19/2012   Abnormal TSH 04/09/2012   Coronary artery disease involving native heart with unstable angina pectoris ([place]) 01/14/2012   Hx of non-ST elevation myocardial infarction (NSTEMI) 01/05/2012   Tobacco abuse 01/05/2012   Chronic hepatitis C without hepatic coma ([place]) 01/05/2012   Depression, recurrent ([place]) 01/05/2012   Thrombocytopenia ([place]) 01/05/2012   PCP:  [name], MD Pharmacy:   [place], [place] - [address]  [place] [address] Phone: [phone] Fax: [phone]  [id number] [id number]: [phone]: [phone]    Social Determinants of Health (SDOH) Social History: SDOH Screenings   Food Insecurity: No Food Insecurity (04/29/2022)  Housing: Medium Risk (04/29/2022)  Transportation Needs: No Transportation Needs (04/29/2022)  Utilities: Not At Risk (04/29/2022)  Tobacco Use: High Risk (04/29/2022)   SDOH Interventions:      Readmission Risk Interventions     No data to display

## 2022-04-30 NOTE — Progress Notes (Signed)
PROGRESS NOTE  Hunter Mcmillan [id number] DOB: [birth year] DOA: 04/29/2022 PCP: [name], MD  HPI/Recap of past 24 hours: Hunter Mcmillan is a 71 y.o. male with medical history significant of ETOH abuse, Hepatitis C s/p treatment with Harvoni, Hyperlipidemia, CHFref35%, HTN, CVA, thrombocytopenia tobacco abuse, CAD s/p NSTEMI s/p DES, COPD not on home O2, who presents to ED with sob, cough fever  x 3 days. Patient was noted by EMS to be 89% on ra and was placed on 3L with improvement to 96%. Resp panel showed parainfluenza virus. CXR with emphysematous changes but no acute pulmonary findings. CT PE with no evidence for PE. Noted mediastinal and right hilar lymphadenopathy of uncertain etiology.  Patient admitted for further management.    Today, patient noted with non-productive coughing spells, reported some shortness of breath, noted to be requiring oxygen earlier this a.m.    Assessment/Plan: Principal Problem:   Acute exacerbation of chronic obstructive pulmonary disease (COPD) (HCC)   Parainfluenza virus infection Acute COPD exacerbation with acute hypoxic respiratory failure  Required about 3 L of O2 on presentation, weaning off CXR with emphysematous changes but no acute pulmonary findings CTA chest with no evidence for PE. Noted mediastinal and right hilar lymphadenopathy of uncertain etiology Continue steroid, azithromycin Duoneb, inhalers, cough suppressants Supplemental O2 as needed  Chronic systolic and diastolic HF Hypertension BNP 140.1 Appears compensated Last echo done in 2022 showed EF of 30 to 35%, regional wall motion abnormalities, grade 2 diastolic dysfunction Resume home Entresto, carvediolol, hold Lasix for now  Lactic acidosis Likely 2/2 infection, s/p IV fluids Repeat in a.m.   CAD s/p NSTEMI s/p DES Hyperlipidemia Currently chest pain-free Troponin negative, EKG with no acute ST changes Resume aspirin, Lipitor, carvedilol    CVA Continue on secondary ppx with asa  History of Hepatitis C s/p treatment with Harvoni  Chronic thrombocytopenia Ongoing, mild   Tobacco abuse  States he currently smokes one cigarette per day Advised to quit smoking   ETOH abuse ??per patient he drink daily but small amount CIWA protocol Advised to quit             Estimated body mass index is 21.7 kg/m as calculated from the following:   Height as of this encounter: '5\' 11"'$  (1.803 m).   Weight as of this encounter: 70.6 kg.     Code Status: Full  Family Communication: None at bedside  Disposition Plan: Status is: Inpatient Remains inpatient appropriate because: Level of care      Consultants: None  Procedures: None  Antimicrobials: Azithromycin  DVT prophylaxis: Lovenox   Objective: Vitals:   04/30/22 0321 04/30/22 0749 04/30/22 0817 04/30/22 1231  BP: 118/60  109/76 136/66  Pulse: 71  74 71  Resp: 18  18   Temp: 98 F (36.7 C)  97.8 F (36.6 C) 97.7 F (36.5 C)  TempSrc: Oral  Oral Oral  SpO2: 97% 94% 96% 95%  Weight:      Height:        Intake/Output Summary (Last 24 hours) at 04/30/2022 1301 Last data filed at 04/30/2022 0600 Gross per 24 hour  Intake 1609.33 ml  Output --  Net 1609.33 ml   Filed Weights   04/29/22 1601 04/29/22 2330  Weight: 72.6 kg 70.6 kg    Exam: General: NAD  Cardiovascular: S1, S2 present Respiratory: Diminished breath sounds bilaterally Abdomen: Soft, nontender, nondistended, bowel sounds present Musculoskeletal: No bilateral pedal edema noted Skin: Normal Psychiatry: Normal mood  Data Reviewed: CBC: Recent Labs  Lab 04/29/22 1543 04/29/22 2014 04/30/22 0050  WBC 4.0 3.5* 3.1*  NEUTROABS 2.2  --   --   HGB 16.1 14.3 15.0  HCT 48.5 43.4 44.5  MCV 103.0* 102.4* 101.4*  PLT 140* 112* 123456*   Basic Metabolic Panel: Recent Labs  Lab 04/29/22 1543 04/29/22 2014 04/30/22 0050  NA 137  --  134*  K 4.5  --  3.8  CL 102  --  101   CO2 26  --  23  GLUCOSE 114*  --  188*  BUN 19  --  18  CREATININE 1.24 1.15 1.12  CALCIUM 9.1  --  8.5*   GFR: Estimated Creatinine Clearance: 59.5 mL/min (by C-G formula based on SCr of 1.12 mg/dL). Liver Function Tests: Recent Labs  Lab 04/29/22 1543 04/30/22 0050  AST 36 31  ALT 20 18  ALKPHOS 78 68  BILITOT 0.9 0.8  PROT 8.3* 7.0  ALBUMIN 4.3 3.9   No results for input(s): "LIPASE", "AMYLASE" in the last 168 hours. No results for input(s): "AMMONIA" in the last 168 hours. Coagulation Profile: Recent Labs  Lab 04/29/22 1543  INR 1.1   Cardiac Enzymes: No results for input(s): "CKTOTAL", "CKMB", "CKMBINDEX", "TROPONINI" in the last 168 hours. BNP (last 3 results) No results for input(s): "PROBNP" in the last 8760 hours. HbA1C: No results for input(s): "HGBA1C" in the last 72 hours. CBG: No results for input(s): "GLUCAP" in the last 168 hours. Lipid Profile: No results for input(s): "CHOL", "HDL", "LDLCALC", "TRIG", "CHOLHDL", "LDLDIRECT" in the last 72 hours. Thyroid Function Tests: No results for input(s): "TSH", "T4TOTAL", "FREET4", "T3FREE", "THYROIDAB" in the last 72 hours. Anemia Panel: No results for input(s): "VITAMINB12", "FOLATE", "FERRITIN", "TIBC", "IRON", "RETICCTPCT" in the last 72 hours. Urine analysis:    Component Value Date/Time   COLORURINE YELLOW 04/29/2022 [place] 04/29/2022 1605   LABSPEC 1.020 04/29/2022 1605   PHURINE 5.0 04/29/2022 1605   GLUCOSEU NEGATIVE 04/29/2022 1605   HGBUR NEGATIVE 04/29/2022 1605   BILIRUBINUR NEGATIVE 04/29/2022 1605   KETONESUR 5 (A) 04/29/2022 1605   PROTEINUR NEGATIVE 04/29/2022 1605   UROBILINOGEN 1.0 01/05/2012 1400   NITRITE NEGATIVE 04/29/2022 1605   LEUKOCYTESUR NEGATIVE 04/29/2022 1605   Sepsis Labs: '@LABRCNTIP'$ (procalcitonin:4,lacticidven:4)  ) Recent Results (from the past 240 hour(s))  Resp panel by RT-PCR (RSV, Flu A&B, Covid) Anterior Nasal Swab     Status: None    Collection Time: 04/29/22  3:43 PM   Specimen: Anterior Nasal Swab  Result Value Ref Range Status   SARS Coronavirus 2 by RT PCR NEGATIVE NEGATIVE Final    Comment: (NOTE) SARS-CoV-2 target nucleic acids are NOT DETECTED.  The SARS-CoV-2 RNA is generally detectable in upper respiratory specimens during the acute phase of infection. The lowest concentration of SARS-CoV-2 viral copies this assay can detect is 138 copies/mL. A negative result does not preclude SARS-Cov-2 infection and should not be used as the sole basis for treatment or other patient management decisions. A negative result may occur with  improper specimen collection/handling, submission of specimen other than nasopharyngeal swab, presence of viral mutation(s) within the areas targeted by this assay, and inadequate number of viral copies(<138 copies/mL). A negative result must be combined with clinical observations, patient history, and epidemiological information. The expected result is Negative.  Fact Sheet for Patients:  EntrepreneurPulse.com.au  Fact Sheet for Healthcare Providers:  IncredibleEmployment.be  This test is no t yet approved or cleared by the  [place] [employer]ural [employer] and  has been authorized for detection and/or diagnosis of SARS-CoV-2 by FDA under an Print production planner (EUA). This EUA will remain  in effect (meaning this test can be used) for the duration of the COVID-19 declaration under Section 564(b)(1) of the Act, 21 U.S.C.section 360bbb-3(b)(1), unless the authorization is terminated  or revoked sooner.       Influenza A by PCR NEGATIVE NEGATIVE Final   Influenza B by PCR NEGATIVE NEGATIVE Final    Comment: (NOTE) The Xpert Xpress SARS-CoV-2/FLU/RSV plus assay is intended as an aid in the diagnosis of influenza from Nasopharyngeal swab specimens and should not be used as a sole basis for treatment. Nasal washings and aspirates are unacceptable for  Xpert Xpress SARS-CoV-2/FLU/RSV testing.  Fact Sheet for Patients: EntrepreneurPulse.com.au  Fact Sheet for Healthcare Providers: IncredibleEmployment.be  This test is not yet approved or cleared by the [place] FDA and has been authorized for detection and/or diagnosis of SARS-CoV-2 by FDA under an Emergency Use Authorization (EUA). This EUA will remain in effect (meaning this test can be used) for the duration of the COVID-19 declaration under Section 564(b)(1) of the Act, 21 U.S.C. section 360bbb-3(b)(1), unless the authorization is terminated or revoked.     Resp Syncytial Virus by PCR NEGATIVE NEGATIVE Final    Comment: (NOTE) Fact Sheet for Patients: EntrepreneurPulse.com.au  Fact Sheet for Healthcare Providers: IncredibleEmployment.be  This test is not yet approved or cleared by the [place] FDA and has been authorized for detection and/or diagnosis of SARS-CoV-2 by FDA under an Emergency Use Authorization (EUA). This EUA will remain in effect (meaning this test can be used) for the duration of the COVID-19 declaration under Section 564(b)(1) of the Act, 21 U.S.C. section 360bbb-3(b)(1), unless the authorization is terminated or revoked.  Performed at [facility], [place] [address], [place], [place] [address]   Culture, blood (Routine x 2)     Status: None (Preliminary result)   Collection Time: 04/29/22  3:48 PM   Specimen: BLOOD LEFT HAND  Result Value Ref Range Status   Specimen Description   Final    BLOOD LEFT HAND Performed at [facility], [place] [address], [place], [place] [address]    Special Requests   Final    BOTTLES DRAWN AEROBIC AND ANAEROBIC Blood Culture adequate volume Performed at [place] [address] [place], [place] [address]    Culture PENDING  Incomplete   Report Status PENDING  Incomplete  Respiratory (~20  pathogens) panel by PCR     Status: Abnormal   Collection Time: 04/29/22  9:08 PM   Specimen: Nasopharyngeal Swab; Respiratory  Result Value Ref Range Status   Adenovirus NOT DETECTED NOT DETECTED Final   Coronavirus 229E NOT DETECTED NOT DETECTED Final    Comment: (NOTE) The Coronavirus on the Respiratory Panel, DOES NOT test for the novel  Coronavirus (2019 nCoV)    Coronavirus HKU1 NOT DETECTED NOT DETECTED Final   Coronavirus NL63 NOT DETECTED NOT DETECTED Final   Coronavirus OC43 NOT DETECTED NOT DETECTED Final   Metapneumovirus NOT DETECTED NOT DETECTED Final   Rhinovirus / Enterovirus NOT DETECTED NOT DETECTED Final   Influenza A NOT DETECTED NOT DETECTED Final   Influenza B NOT DETECTED NOT DETECTED Final   Parainfluenza Virus 1 NOT DETECTED NOT DETECTED Final   Parainfluenza Virus 2 NOT DETECTED NOT DETECTED Final   Parainfluenza Virus 3 DETECTED (A) NOT DETECTED Final   Parainfluenza Virus  4 NOT DETECTED NOT DETECTED Final   Respiratory Syncytial Virus NOT DETECTED NOT DETECTED Final   Bordetella pertussis NOT DETECTED NOT DETECTED Final   Bordetella Parapertussis NOT DETECTED NOT DETECTED Final   Chlamydophila pneumoniae NOT DETECTED NOT DETECTED Final   Mycoplasma pneumoniae NOT DETECTED NOT DETECTED Final    Comment: Performed at [facility], [place] [address], [place], [place] [address]  Expectorated Sputum Assessment w Gram Stain, Rflx to Resp Cult     Status: None   Collection Time: 04/30/22  8:37 AM   Specimen: Sputum  Result Value Ref Range Status   Specimen Description SPUTUM  Final   Special Requests NONE  Final   Sputum evaluation   Final    Sputum specimen not acceptable for testing.  Please recollect.   Performed at [facility], [place] [address], [place], [place] [address]    Report Status 04/30/2022 FINAL  Final      Studies: CT Angio Chest PE W and/or Wo Contrast  Result Date: 04/29/2022 CLINICAL DATA:  Shortness of breath and  cough EXAM: CT ANGIOGRAPHY CHEST WITH CONTRAST TECHNIQUE: Multidetector CT imaging of the chest was performed using the standard protocol during bolus administration of intravenous contrast. Multiplanar CT image reconstructions and MIPs were obtained to evaluate the vascular anatomy. RADIATION DOSE REDUCTION: This exam was performed according to the departmental dose-optimization program which includes automated exposure control, adjustment of the mA and/or kV according to patient size and/or use of iterative reconstruction technique. CONTRAST:  131m OMNIPAQUE IOHEXOL 350 MG/ML SOLN COMPARISON:  CT angiogram chest 05/27/2020 FINDINGS: Cardiovascular: Satisfactory opacification of the pulmonary arteries to the segmental level. No evidence of pulmonary embolism. Normal heart size. No pericardial effusion. There are atherosclerotic calcifications of the aorta and coronary arteries Mediastinum/Nodes: There is an enlarged subcarinal lymph node measuring 12 mm short axis. There is an enlarged right hilar lymph node measuring 13 mm short axis. Visualized esophagus and thyroid gland are within normal limits. Lungs/Pleura: Mild emphysematous changes are present. There is no focal lung infiltrate, pleural effusion or pneumothorax. There is minimal atelectasis in the lung bases. Trachea and central airways are patent. Upper Abdomen: No acute abnormality. Musculoskeletal: Degenerative changes affect the spine Review of the MIP images confirms the above findings. IMPRESSION: 1. No evidence for pulmonary embolism. 2. No acute cardiopulmonary process. 3. Mediastinal and right hilar lymphadenopathy of uncertain etiology. Aortic Atherosclerosis (ICD10-I70.0) and Emphysema (ICD10-J43.9). Electronically Signed   By: ARonney AstersM.D.   On: 04/29/2022 18:54   DG Chest 2 View  Result Date: 04/29/2022 CLINICAL DATA:  Sepsis. Shortness of breath, fever and productive cough for 3 days. EXAM: CHEST - 2 VIEW COMPARISON:  Chest x-ray  12/11/2020 FINDINGS: The cardiac silhouette, mediastinal and hilar contours are within normal limits and stable. The lungs are clear of an acute process. No infiltrates, edema or effusions. Stable underlying emphysematous changes and hyperinflation. No pulmonary lesions or pneumothorax. The bony thorax is intact. IMPRESSION: Emphysematous changes but no acute pulmonary findings. Electronically Signed   By: PMarijo SanesM.D.   On: 04/29/2022 16:42    Scheduled Meds:  aspirin EC  81 mg Oral q1800   atorvastatin  80 mg Oral q1800   azithromycin  500 mg Oral Daily   carvedilol  6.25 mg Oral BID WC   dextromethorphan-guaiFENesin  1 tablet Oral BID   enoxaparin (LOVENOX) injection  40 mg Subcutaneous Q24H   influenza vaccine adjuvanted  0.5 mL Intramuscular Tomorrow-1000  ipratropium-albuterol  3 mL Nebulization TID   [START ON 05/01/2022] predniSONE  40 mg Oral Q breakfast   tamsulosin  0.4 mg Oral QPM    Continuous Infusions:   LOS: 1 day     Alma Friendly, MD [facility]  If 7PM-7AM, please contact night-coverage [email or web address] 04/30/2022, 1:01 PM

## 2022-04-30 NOTE — Plan of Care (Signed)
  Problem: Education: Goal: Knowledge of disease or condition will improve Outcome: Progressing   Problem: Activity: Goal: Risk for activity intolerance will decrease Outcome: Progressing   Problem: Elimination: Goal: Will not experience complications related to bowel motility Outcome: Progressing Goal: Will not experience complications related to urinary retention Outcome: Progressing

## 2022-04-30 NOTE — Progress Notes (Signed)
   04/30/22 0244  Provider Notification  Provider Name/Title Gershon Cull NP  Date Provider Notified 04/30/22  Time Provider Notified [phone]  Method of Notification  (secure chat)  Notification Reason Critical Result  Test performed and critical result lactic acid 2.1  Date Critical Result Received 04/30/22  Time Critical Result Received [id number]  Provider response See new orders  Date of Provider Response 04/30/22  Time of Provider Response [phone]

## 2022-05-01 LAB — CBC WITH DIFFERENTIAL/PLATELET
Abs Immature Granulocytes: 0.04 10*3/uL (ref 0.00–0.07)
Basophils Absolute: 0 10*3/uL (ref 0.0–0.1)
Basophils Relative: 0 %
Eosinophils Absolute: 0 10*3/uL (ref 0.0–0.5)
Eosinophils Relative: 0 %
HCT: 43.6 % (ref 39.0–52.0)
Hemoglobin: 14.5 g/dL (ref 13.0–17.0)
Immature Granulocytes: 0 %
Lymphocytes Relative: 11 %
Lymphs Abs: 1.1 10*3/uL (ref 0.7–4.0)
MCH: 33.5 pg (ref 26.0–34.0)
MCHC: 33.3 g/dL (ref 30.0–36.0)
MCV: 100.7 fL — ABNORMAL HIGH (ref 80.0–100.0)
Monocytes Absolute: 0.9 10*3/uL (ref 0.1–1.0)
Monocytes Relative: 9 %
Neutro Abs: 8.1 10*3/uL — ABNORMAL HIGH (ref 1.7–7.7)
Neutrophils Relative %: 80 %
Platelets: 115 10*3/uL — ABNORMAL LOW (ref 150–400)
RBC: 4.33 MIL/uL (ref 4.22–5.81)
RDW: 13.3 % (ref 11.5–15.5)
WBC: 10.2 10*3/uL (ref 4.0–10.5)
nRBC: 0 % (ref 0.0–0.2)

## 2022-05-01 LAB — BASIC METABOLIC PANEL
Anion gap: 7 (ref 5–15)
BUN: 25 mg/dL — ABNORMAL HIGH (ref 8–23)
CO2: 23 mmol/L (ref 22–32)
Calcium: 8.6 mg/dL — ABNORMAL LOW (ref 8.9–10.3)
Chloride: 102 mmol/L (ref 98–111)
Creatinine, Ser: 0.93 mg/dL (ref 0.61–1.24)
GFR, Estimated: >60 mL/min (ref >60)
Glucose, Bld: 137 mg/dL — ABNORMAL HIGH (ref 70–99)
Potassium: 4.6 mmol/L (ref 3.5–5.1)
Sodium: 132 mmol/L — ABNORMAL LOW (ref 135–145)

## 2022-05-01 LAB — LACTIC ACID, PLASMA: Lactic Acid, Venous: 1.5 mmol/L (ref 0.5–1.9)

## 2022-05-01 MED ORDER — IPRATROPIUM-ALBUTEROL 0.5-2.5 (3) MG/3ML IN SOLN
3.0000 mL | Freq: Two times a day (BID) | RESPIRATORY_TRACT | Status: DC
  Administered 2022-05-02: 3 mL via RESPIRATORY_TRACT
  Filled 2022-05-01: qty 3

## 2022-05-01 NOTE — Progress Notes (Signed)
Pt ambulated on RA oxygen saturation 91% and above. Pt tolerated well, however later stated that he is SOB.  Will continue to monitor.

## 2022-05-01 NOTE — Progress Notes (Signed)
PROGRESS NOTE  Hunter Mcmillan [id number] DOB: 1950/04/11 DOA: 04/29/2022 PCP: Hunter Mclean, MD  HPI/Recap of past 24 hours: Hunter Mcmillan is a 72 y.o. male with medical history significant of ETOH abuse, Hepatitis C s/p treatment with Harvoni, Hyperlipidemia, CHFref35%, HTN, CVA, thrombocytopenia tobacco abuse, CAD s/p NSTEMI s/p DES, COPD not on home O2, who presents to ED with sob, cough fever  x 3 days. Patient was noted by EMS to be 89% on ra and was placed on 3L with improvement to 96%. Resp panel showed parainfluenza virus. CXR with emphysematous changes but no acute pulmonary findings. CT PE with no evidence for PE. Noted mediastinal and right hilar lymphadenopathy of uncertain etiology.  Patient admitted for further management.    Today, patient denies any worsening shortness of breath, still with coughing spells.    Assessment/Plan: Principal Problem:   Acute exacerbation of chronic obstructive pulmonary disease (COPD) (HCC)   Parainfluenza virus infection Acute COPD exacerbation with acute hypoxic respiratory failure  Required about 3 L of O2 on presentation, weaning off CXR with emphysematous changes but no acute pulmonary findings CTA chest with no evidence for PE. Noted mediastinal and right hilar lymphadenopathy of uncertain etiology Continue steroid, azithromycin Duoneb, inhalers, cough suppressants Supplemental O2 as needed  Chronic systolic and diastolic HF Hypertension BNP 140.1 Appears compensated Last echo done in 2022 showed EF of 30 to 35%, regional wall motion abnormalities, grade 2 diastolic dysfunction Resume home Entresto, carvediolol, hold Lasix for now  Lactic acidosis Resolved Likely 2/2 infection, s/p IV fluids Repeat in a.m.   CAD s/p NSTEMI s/p DES Hyperlipidemia Currently chest pain-free Troponin negative, EKG with no acute ST changes Resume aspirin, Lipitor, carvedilol   CVA Continue on secondary ppx with asa  History  of Hepatitis C s/p treatment with Harvoni  Chronic thrombocytopenia Ongoing, mild   Tobacco abuse  States he currently smokes one cigarette per day Advised to quit smoking   ETOH abuse ??per patient he drink daily but small amount CIWA protocol Advised to quit       Estimated body mass index is 21.7 kg/m as calculated from the following:   Height as of this encounter: '5\' 11"'$  (1.803 m).   Weight as of this encounter: 70.6 kg.     Code Status: Full  Family Communication: None at bedside  Disposition Plan: Status is: Inpatient Remains inpatient appropriate because: Level of care      Consultants: None  Procedures: None  Antimicrobials: Azithromycin  DVT prophylaxis: Lovenox   Objective: Vitals:   05/01/22 0558 05/01/22 0833 05/01/22 1152 05/01/22 1324  BP: 105/64  106/74   Pulse: 60  67   Resp:   20   Temp: 98 F (36.7 C)  98.2 F (36.8 C)   TempSrc: Oral  Oral   SpO2: 96% 95% 91% 96%  Weight:      Height:        Intake/Output Summary (Last 24 hours) at 05/01/2022 1713 Last data filed at 05/01/2022 0911 Gross per 24 hour  Intake 360 ml  Output 1350 ml  Net -990 ml   Filed Weights   04/29/22 1601 04/29/22 2330  Weight: 72.6 kg 70.6 kg    Exam: General: NAD  Cardiovascular: S1, S2 present Respiratory: Diminished breath sounds bilaterally Abdomen: Soft, nontender, nondistended, bowel sounds present Musculoskeletal: No bilateral pedal edema noted Skin: Normal Psychiatry: Normal mood    Data Reviewed: CBC: Recent Labs  Lab 04/29/22 1543 04/29/22 2014 04/30/22 0050 05/01/22 0453  WBC 4.0 3.5* 3.1* 10.2  NEUTROABS 2.2  --   --  8.1*  HGB 16.1 14.3 15.0 14.5  HCT 48.5 43.4 44.5 43.6  MCV 103.0* 102.4* 101.4* 100.7*  PLT 140* 112* 118* [id number]*   Basic Metabolic Panel: Recent Labs  Lab 04/29/22 1543 04/29/22 2014 04/30/22 0050 05/01/22 0453  NA 137  --  134* 132*  K 4.5  --  3.8 4.6  CL 102  --  101 102  CO2 26  --  23 23   GLUCOSE 114*  --  188* 137*  BUN 19  --  18 25*  CREATININE 1.24 1.15 1.12 0.93  CALCIUM 9.1  --  8.5* 8.6*   GFR: Estimated Creatinine Clearance: 71.7 mL/min (by C-G formula based on SCr of 0.93 mg/dL). Liver Function Tests: Recent Labs  Lab 04/29/22 1543 04/30/22 0050  AST 36 31  ALT 20 18  ALKPHOS 78 68  BILITOT 0.9 0.8  PROT 8.3* 7.0  ALBUMIN 4.3 3.9   No results for input(s): "LIPASE", "AMYLASE" in the last 168 hours. No results for input(s): "AMMONIA" in the last 168 hours. Coagulation Profile: Recent Labs  Lab 04/29/22 1543  INR 1.1   Cardiac Enzymes: No results for input(s): "CKTOTAL", "CKMB", "CKMBINDEX", "TROPONINI" in the last 168 hours. BNP (last 3 results) No results for input(s): "PROBNP" in the last 8760 hours. HbA1C: No results for input(s): "HGBA1C" in the last 72 hours. CBG: No results for input(s): "GLUCAP" in the last 168 hours. Lipid Profile: No results for input(s): "CHOL", "HDL", "LDLCALC", "TRIG", "CHOLHDL", "LDLDIRECT" in the last 72 hours. Thyroid Function Tests: No results for input(s): "TSH", "T4TOTAL", "FREET4", "T3FREE", "THYROIDAB" in the last 72 hours. Anemia Panel: No results for input(s): "VITAMINB12", "FOLATE", "FERRITIN", "TIBC", "IRON", "RETICCTPCT" in the last 72 hours. Urine analysis:    Component Value Date/Time   COLORURINE YELLOW 04/29/2022 [place] 04/29/2022 1605   LABSPEC 1.020 04/29/2022 1605   PHURINE 5.0 04/29/2022 1605   GLUCOSEU NEGATIVE 04/29/2022 1605   HGBUR NEGATIVE 04/29/2022 1605   BILIRUBINUR NEGATIVE 04/29/2022 1605   KETONESUR 5 (A) 04/29/2022 1605   PROTEINUR NEGATIVE 04/29/2022 1605   UROBILINOGEN 1.0 01/05/2012 1400   NITRITE NEGATIVE 04/29/2022 1605   LEUKOCYTESUR NEGATIVE 04/29/2022 1605   Sepsis Labs: '@LABRCNTIP'$ (procalcitonin:4,lacticidven:4)  ) Recent Results (from the past 240 hour(s))  Resp panel by RT-PCR (RSV, Flu A&B, Covid) Anterior Nasal Swab     Status: None    Collection Time: 04/29/22  3:43 PM   Specimen: Anterior Nasal Swab  Result Value Ref Range Status   SARS Coronavirus 2 by RT PCR NEGATIVE NEGATIVE Final    Comment: (NOTE) SARS-CoV-2 target nucleic acids are NOT DETECTED.  The SARS-CoV-2 RNA is generally detectable in upper respiratory specimens during the acute phase of infection. The lowest concentration of SARS-CoV-2 viral copies this assay can detect is 138 copies/mL. A negative result does not preclude SARS-Cov-2 infection and should not be used as the sole basis for treatment or other patient management decisions. A negative result may occur with  improper specimen collection/handling, submission of specimen other than nasopharyngeal swab, presence of viral mutation(s) within the areas targeted by this assay, and inadequate number of viral copies(<138 copies/mL). A negative result must be combined with clinical observations, patient history, and epidemiological information. The expected result is Negative.  Fact Sheet for Patients:  EntrepreneurPulse.com.au  Fact Sheet for Healthcare Providers:  IncredibleEmployment.be  This test is no t yet approved or cleared  by the [place] and  has been authorized for detection and/or diagnosis of SARS-CoV-2 by FDA under an Emergency Use Authorization (EUA). This EUA will remain  in effect (meaning this test can be used) for the duration of the COVID-19 declaration under Section 564(b)(1) of the Act, 21 U.S.C.section 360bbb-3(b)(1), unless the authorization is terminated  or revoked sooner.       Influenza A by PCR NEGATIVE NEGATIVE Final   Influenza B by PCR NEGATIVE NEGATIVE Final    Comment: (NOTE) The Xpert Xpress SARS-CoV-2/FLU/RSV plus assay is intended as an aid in the diagnosis of influenza from Nasopharyngeal swab specimens and should not be used as a sole basis for treatment. Nasal washings and aspirates are unacceptable for  Xpert Xpress SARS-CoV-2/FLU/RSV testing.  Fact Sheet for Patients: EntrepreneurPulse.com.au  Fact Sheet for Healthcare Providers: IncredibleEmployment.be  This test is not yet approved or cleared by the [place] FDA and has been authorized for detection and/or diagnosis of SARS-CoV-2 by FDA under an Emergency Use Authorization (EUA). This EUA will remain in effect (meaning this test can be used) for the duration of the COVID-19 declaration under Section 564(b)(1) of the Act, 21 U.S.C. section 360bbb-3(b)(1), unless the authorization is terminated or revoked.     Resp Syncytial Virus by PCR NEGATIVE NEGATIVE Final    Comment: (NOTE) Fact Sheet for Patients: EntrepreneurPulse.com.au  Fact Sheet for Healthcare Providers: IncredibleEmployment.be  This test is not yet approved or cleared by the [place] FDA and has been authorized for detection and/or diagnosis of SARS-CoV-2 by FDA under an Emergency Use Authorization (EUA). This EUA will remain in effect (meaning this test can be used) for the duration of the COVID-19 declaration under Section 564(b)(1) of the Act, 21 U.S.C. section 360bbb-3(b)(1), unless the authorization is terminated or revoked.  Performed at [facility], [place] [address] [place], [place] [address]   Culture, blood (Routine x 2)     Status: Abnormal (Preliminary result)   Collection Time: 04/29/22  3:48 PM   Specimen: BLOOD LEFT HAND  Result Value Ref Range Status   Specimen Description   Final    BLOOD LEFT HAND Performed at [facility], [place] [address], [place], [place] [address]    Special Requests   Final    BOTTLES DRAWN AEROBIC AND ANAEROBIC Blood Culture adequate volume Performed at [place] [address] [place], [place] [address]    Culture  Setup Time   Final    GRAM POSITIVE COCCI IN CLUSTERS ANAEROBIC BOTTLE  ONLY CRITICAL RESULT CALLED TO, READ BACK BY AND VERIFIED WITH: PHARMD LEANN POINDEXTER ON 04/30/22 @ 2236 BY DRT    Culture (A)  Final    STAPHYLOCOCCUS EPIDERMIDIS THE SIGNIFICANCE OF ISOLATING THIS ORGANISM FROM A SINGLE SET OF BLOOD CULTURES WHEN MULTIPLE SETS ARE DRAWN IS UNCERTAIN. PLEASE NOTIFY THE MICROBIOLOGY DEPARTMENT WITHIN ONE WEEK IF SPECIATION AND SENSITIVITIES ARE REQUIRED. Performed at [facility], [place] [address], [place], [place] [address]    Report Status PENDING  Incomplete  Blood Culture ID Panel (Reflexed)     Status: Abnormal   Collection Time: 04/29/22  3:48 PM  Result Value Ref Range Status   Enterococcus faecalis NOT DETECTED NOT DETECTED Final   Enterococcus Faecium NOT DETECTED NOT DETECTED Final   Listeria monocytogenes NOT DETECTED NOT DETECTED Final   Staphylococcus species DETECTED (A) NOT DETECTED Final    Comment: CRITICAL RESULT CALLED TO, READ BACK BY AND  VERIFIED WITH: PHARMD LEANN POINDEXTER ON 04/30/22 @ 2236 BY DRT    Staphylococcus aureus (BCID) NOT DETECTED NOT DETECTED Final   Staphylococcus epidermidis DETECTED (A) NOT DETECTED Final    Comment: CRITICAL RESULT CALLED TO, READ BACK BY AND VERIFIED WITH: PHARMD LEANN POINDEXTER ON 04/30/22 @ 2236 BY DRT    Staphylococcus lugdunensis NOT DETECTED NOT DETECTED Final   Streptococcus species NOT DETECTED NOT DETECTED Final   Streptococcus agalactiae NOT DETECTED NOT DETECTED Final   Streptococcus pneumoniae NOT DETECTED NOT DETECTED Final   Streptococcus pyogenes NOT DETECTED NOT DETECTED Final   A.calcoaceticus-baumannii NOT DETECTED NOT DETECTED Final   Bacteroides fragilis NOT DETECTED NOT DETECTED Final   Enterobacterales NOT DETECTED NOT DETECTED Final   Enterobacter cloacae complex NOT DETECTED NOT DETECTED Final   Escherichia coli NOT DETECTED NOT DETECTED Final   Klebsiella aerogenes NOT DETECTED NOT DETECTED Final   Klebsiella oxytoca NOT DETECTED NOT DETECTED Final   Klebsiella  pneumoniae NOT DETECTED NOT DETECTED Final   Proteus species NOT DETECTED NOT DETECTED Final   Salmonella species NOT DETECTED NOT DETECTED Final   Serratia marcescens NOT DETECTED NOT DETECTED Final   Haemophilus influenzae NOT DETECTED NOT DETECTED Final   Neisseria meningitidis NOT DETECTED NOT DETECTED Final   Pseudomonas aeruginosa NOT DETECTED NOT DETECTED Final   Stenotrophomonas maltophilia NOT DETECTED NOT DETECTED Final   Candida albicans NOT DETECTED NOT DETECTED Final   Candida auris NOT DETECTED NOT DETECTED Final   Candida glabrata NOT DETECTED NOT DETECTED Final   Candida krusei NOT DETECTED NOT DETECTED Final   Candida parapsilosis NOT DETECTED NOT DETECTED Final   Candida tropicalis NOT DETECTED NOT DETECTED Final   Cryptococcus neoformans/gattii NOT DETECTED NOT DETECTED Final   Methicillin resistance mecA/C NOT DETECTED NOT DETECTED Final    Comment: Performed at [facility], [place]. [address], [place], [place] [address]  Respiratory (~20 pathogens) panel by PCR     Status: Abnormal   Collection Time: 04/29/22  9:08 PM   Specimen: Nasopharyngeal Swab; Respiratory  Result Value Ref Range Status   Adenovirus NOT DETECTED NOT DETECTED Final   Coronavirus 229E NOT DETECTED NOT DETECTED Final    Comment: (NOTE) The Coronavirus on the Respiratory Panel, DOES NOT test for the novel  Coronavirus (2019 nCoV)    Coronavirus HKU1 NOT DETECTED NOT DETECTED Final   Coronavirus NL63 NOT DETECTED NOT DETECTED Final   Coronavirus OC43 NOT DETECTED NOT DETECTED Final   Metapneumovirus NOT DETECTED NOT DETECTED Final   Rhinovirus / Enterovirus NOT DETECTED NOT DETECTED Final   Influenza A NOT DETECTED NOT DETECTED Final   Influenza B NOT DETECTED NOT DETECTED Final   Parainfluenza Virus 1 NOT DETECTED NOT DETECTED Final   Parainfluenza Virus 2 NOT DETECTED NOT DETECTED Final   Parainfluenza Virus 3 DETECTED (A) NOT DETECTED Final   Parainfluenza Virus 4 NOT DETECTED NOT  DETECTED Final   Respiratory Syncytial Virus NOT DETECTED NOT DETECTED Final   Bordetella pertussis NOT DETECTED NOT DETECTED Final   Bordetella Parapertussis NOT DETECTED NOT DETECTED Final   Chlamydophila pneumoniae NOT DETECTED NOT DETECTED Final   Mycoplasma pneumoniae NOT DETECTED NOT DETECTED Final    Comment: Performed at [facility], [place]. [address] [place], [place] [address]  Expectorated Sputum Assessment w Gram Stain, Rflx to Resp Cult     Status: None   Collection Time: 04/30/22  8:37 AM   Specimen: Sputum  Result Value Ref Range Status  Specimen Description SPUTUM  Final   Special Requests NONE  Final   Sputum evaluation   Final    Sputum specimen not acceptable for testing.  Please recollect.   Performed at [facility], [place] [address], [place], [place] [address]    Report Status 04/30/2022 FINAL  Final      Studies: No results found.  Scheduled Meds:  aspirin EC  81 mg Oral q1800   atorvastatin  80 mg Oral q1800   azithromycin  500 mg Oral Daily   carvedilol  6.25 mg Oral BID WC   dextromethorphan-guaiFENesin  1 tablet Oral BID   enoxaparin (LOVENOX) injection  40 mg Subcutaneous Q24H   influenza vaccine adjuvanted  0.5 mL Intramuscular Tomorrow-1000   ipratropium-albuterol  3 mL Nebulization TID   predniSONE  40 mg Oral Q breakfast   sacubitril-valsartan  1 tablet Oral BID   tamsulosin  0.4 mg Oral QPM    Continuous Infusions:   LOS: 2 days     Alma Friendly, MD [facility]  If 7PM-7AM, please contact night-coverage [email or web address] 05/01/2022, 5:13 PM

## 2022-05-02 LAB — CBC WITH DIFFERENTIAL/PLATELET
Abs Immature Granulocytes: 0.05 10*3/uL (ref 0.00–0.07)
Basophils Absolute: 0 10*3/uL (ref 0.0–0.1)
Basophils Relative: 0 %
Eosinophils Absolute: 0 10*3/uL (ref 0.0–0.5)
Eosinophils Relative: 0 %
HCT: 44.6 % (ref 39.0–52.0)
Hemoglobin: 14.9 g/dL (ref 13.0–17.0)
Immature Granulocytes: 1 %
Lymphocytes Relative: 23 %
Lymphs Abs: 2.1 10*3/uL (ref 0.7–4.0)
MCH: 33.9 pg (ref 26.0–34.0)
MCHC: 33.4 g/dL (ref 30.0–36.0)
MCV: 101.6 fL — ABNORMAL HIGH (ref 80.0–100.0)
Monocytes Absolute: 0.8 10*3/uL (ref 0.1–1.0)
Monocytes Relative: 9 %
Neutro Abs: 6.1 10*3/uL (ref 1.7–7.7)
Neutrophils Relative %: 67 %
Platelets: 111 10*3/uL — ABNORMAL LOW (ref 150–400)
RBC: 4.39 MIL/uL (ref 4.22–5.81)
RDW: 13.3 % (ref 11.5–15.5)
WBC: 9.1 10*3/uL (ref 4.0–10.5)
nRBC: 0 % (ref 0.0–0.2)

## 2022-05-02 LAB — BASIC METABOLIC PANEL
Anion gap: 7 (ref 5–15)
BUN: 27 mg/dL — ABNORMAL HIGH (ref 8–23)
CO2: 25 mmol/L (ref 22–32)
Calcium: 8.8 mg/dL — ABNORMAL LOW (ref 8.9–10.3)
Chloride: 103 mmol/L (ref 98–111)
Creatinine, Ser: 0.93 mg/dL (ref 0.61–1.24)
GFR, Estimated: >60 mL/min (ref >60)
Glucose, Bld: 112 mg/dL — ABNORMAL HIGH (ref 70–99)
Potassium: 4 mmol/L (ref 3.5–5.1)
Sodium: 135 mmol/L (ref 135–145)

## 2022-05-02 LAB — CULTURE, BLOOD (ROUTINE X 2): Special Requests: ADEQUATE

## 2022-05-02 MED ORDER — ALBUTEROL SULFATE HFA 108 (90 BASE) MCG/ACT IN AERS: 2.0000 | INHALATION_SPRAY | Freq: Four times a day (QID) | RESPIRATORY_TRACT | 0 refills | Status: AC | PRN

## 2022-05-02 MED ORDER — ENTRESTO 24-26 MG PO TABS: 1.0000 | ORAL_TABLET | Freq: Two times a day (BID) | ORAL | 0 refills | Status: AC

## 2022-05-02 MED ORDER — AZITHROMYCIN 500 MG PO TABS: 500.0000 mg | ORAL_TABLET | Freq: Every day | ORAL | 0 refills | Status: AC

## 2022-05-02 MED ORDER — EZETIMIBE 10 MG PO TABS: 10.0000 mg | ORAL_TABLET | Freq: Every day | ORAL | 0 refills | Status: DC

## 2022-05-02 MED ORDER — MOMETASONE FURO-FORMOTEROL FUM 100-5 MCG/ACT IN AERO: 2.0000 | INHALATION_SPRAY | Freq: Two times a day (BID) | RESPIRATORY_TRACT | 0 refills | Status: AC

## 2022-05-02 MED ORDER — DM-GUAIFENESIN ER 30-600 MG PO TB12: 1.0000 | ORAL_TABLET | Freq: Two times a day (BID) | ORAL | 0 refills | Status: AC

## 2022-05-02 MED ORDER — NITROGLYCERIN 0.4 MG SL SUBL: 0.4000 mg | SUBLINGUAL_TABLET | SUBLINGUAL | 0 refills | Status: AC | PRN

## 2022-05-02 MED ORDER — PREDNISONE 20 MG PO TABS: 40.0000 mg | ORAL_TABLET | Freq: Every day | ORAL | 0 refills | Status: AC

## 2022-05-02 NOTE — Progress Notes (Signed)
SATURATION QUALIFICATIONS: (This note is used to comply with regulatory documentation for home oxygen)  Patient Saturations on Room Air at Rest = 94%  Patient Saturations on Room Air while Ambulating = 98%

## 2022-05-02 NOTE — Discharge Summary (Signed)
Physician Discharge Summary   Patient: Hunter Mcmillan MRN: [id number] DOB: August 23, 1950  Admit date:     04/29/2022  Discharge date: 05/02/22  Discharge Physician: Alma Friendly   PCP: [name], MD   Recommendations at discharge:   Follow-up with PCP in 1 week  Discharge Diagnoses: Principal Problem:   Acute exacerbation of chronic obstructive pulmonary disease (COPD) [facility])    Hospital Course: Hunter Mcmillan is a 72 y.o. male with medical history significant of ETOH abuse, Hepatitis C s/p treatment with Harvoni, Hyperlipidemia, CHFref35%, HTN, CVA, thrombocytopenia tobacco abuse, CAD s/p NSTEMI s/p DES, COPD not on home O2, who presents to ED with sob, cough fever  x 3 days. Patient was noted by EMS to be 89% on ra and was placed on 3L with improvement to 96%. Resp panel showed parainfluenza virus. CXR with emphysematous changes but no acute pulmonary findings. CT PE with no evidence for PE. Noted mediastinal and right hilar lymphadenopathy of uncertain etiology.  Patient admitted for further management.     Today, patient denies any new complaints.  Reports feeling better overall.  Advised patient to quit smoking and be compliant to the medications and follow-up appointments.  Patient able to ambulate the hallway, with O2 sats slightly above 90%.   Assessment and Plan:  Parainfluenza virus infection Acute COPD exacerbation with acute hypoxic respiratory failure  Required about 3 L of O2 on presentation, weaned off CXR with emphysematous changes but no acute pulmonary findings CTA chest with no evidence for PE. Noted mediastinal and right hilar lymphadenopathy of uncertain etiology Continue steroid, azithromycin, cough suppressant till completed Started patient on Dulera inhaler, continue home albuterol as needed Follow-up with PCP   Chronic systolic and diastolic HF Hypertension BNP 140.1 Appears compensated Last echo done in 2022 showed EF of 30 to 35%,  regional wall motion abnormalities, grade 2 diastolic dysfunction Continue home Entresto, carvediolol, Lasix   Lactic acidosis Resolved Likely 2/2 infection, s/p IV fluids   CAD s/p NSTEMI s/p DES Hyperlipidemia Currently chest pain-free Troponin negative, EKG with no acute ST changes Continue aspirin, Lipitor, carvedilol   CVA Continue on secondary ppx with asa   History of Hepatitis C s/p treatment with Harvoni   Chronic thrombocytopenia Ongoing, mild   Tobacco abuse  States he currently smokes 1-2 cigarette per day Advised to quit smoking   ETOH abuse ??per patient he drink daily but small amount Advised to quit         Consultants: None Procedures performed: None Disposition: Home Diet recommendation:  Cardiac diet   DISCHARGE MEDICATION: Allergies as of 05/02/2022   No Known Allergies      Medication List     STOP taking these medications    doxycycline 100 MG capsule Commonly known as: VIBRAMYCIN   HYDROcodone-acetaminophen 10-325 MG tablet Commonly known as: Norco   meloxicam 15 MG tablet Commonly known as: MOBIC       TAKE these medications    acetaminophen 500 MG tablet Commonly known as: TYLENOL Take 1 tablet (500 mg total) by mouth every 6 (six) hours as needed for mild pain, fever or headache.   albuterol 108 (90 Base) MCG/ACT inhaler Commonly known as: VENTOLIN HFA Inhale 2 puffs into the lungs every 6 (six) hours as needed for wheezing or shortness of breath.   aspirin EC 81 MG tablet Take 81 mg by mouth daily at 6 PM.   atorvastatin 80 MG tablet Commonly known as: LIPITOR Take 1 tablet (80 mg  total) by mouth daily at 6 PM.   azithromycin 500 MG tablet Commonly known as: Zithromax Take 1 tablet (500 mg total) by mouth daily for 1 day. Start taking on: May 03, 2022   carvedilol 6.25 MG tablet Commonly known as: COREG Take 1 tablet (6.25 mg total) by mouth 2 (two) times daily.   dextromethorphan-guaiFENesin 30-600  MG 12hr tablet Commonly known as: MUCINEX DM Take 1 tablet by mouth 2 (two) times daily for 3 days.   diphenhydrAMINE 25 mg capsule Commonly known as: BENADRYL Take 25 mg by mouth as needed for allergies.   Entresto 24-26 MG Generic drug: sacubitril-valsartan Take 1 tablet by mouth 2 (two) times daily.   ezetimibe 10 MG tablet Commonly known as: ZETIA Take 1 tablet (10 mg total) by mouth daily.   furosemide 20 MG tablet Commonly known as: LASIX Take 1 tablet (20 mg total) by mouth daily. What changed: when to take this   mometasone-formoterol 100-5 MCG/ACT Aero Commonly known as: DULERA Inhale 2 puffs into the lungs 2 (two) times daily.   multivitamin with minerals Tabs tablet Take 1 tablet by mouth daily at 6 PM.   nitroGLYCERIN 0.4 MG SL tablet Commonly known as: NITROSTAT Place 1 tablet (0.4 mg total) under the tongue every 5 (five) minutes as needed for chest pain.   ondansetron 4 MG disintegrating tablet Commonly known as: Zofran ODT Take 1 tablet (4 mg total) by mouth 2 (two) times daily as needed for nausea or vomiting.   predniSONE 20 MG tablet Commonly known as: DELTASONE Take 2 tablets (40 mg total) by mouth daily with breakfast for 2 days. Start taking on: May 03, 2022   tamsulosin 0.4 MG Caps capsule Commonly known as: FLOMAX Take 1 capsule (0.4 mg total) by mouth daily. What changed: when to take this        Follow-up Information     Copland, Gay Filler, MD. Schedule an appointment as soon as possible for a visit in 1 week(s).   Specialty: Family Medicine Contact information: [place] [address] [phone]                Discharge Exam: Danley Danker Weights   04/29/22 1601 04/29/22 2330  Weight: 72.6 kg 70.6 kg   General: NAD  Cardiovascular: S1, S2 present Respiratory: CTAB Abdomen: Soft, nontender, nondistended, bowel sounds present Musculoskeletal: No bilateral pedal edema noted Skin:  Normal Psychiatry: Normal mood   Condition at discharge: stable  The results of significant diagnostics from this hospitalization (including imaging, microbiology, ancillary and laboratory) are listed below for reference.   Imaging Studies: CT Angio Chest PE W and/or Wo Contrast  Result Date: 04/29/2022 CLINICAL DATA:  Shortness of breath and cough EXAM: CT ANGIOGRAPHY CHEST WITH CONTRAST TECHNIQUE: Multidetector CT imaging of the chest was performed using the standard protocol during bolus administration of intravenous contrast. Multiplanar CT image reconstructions and MIPs were obtained to evaluate the vascular anatomy. RADIATION DOSE REDUCTION: This exam was performed according to the departmental dose-optimization program which includes automated exposure control, adjustment of the mA and/or kV according to patient size and/or use of iterative reconstruction technique. CONTRAST:  159m OMNIPAQUE IOHEXOL 350 MG/ML SOLN COMPARISON:  CT angiogram chest 05/27/2020 FINDINGS: Cardiovascular: Satisfactory opacification of the pulmonary arteries to the segmental level. No evidence of pulmonary embolism. Normal heart size. No pericardial effusion. There are atherosclerotic calcifications of the aorta and coronary arteries Mediastinum/Nodes: There is an enlarged subcarinal lymph node measuring  12 mm short axis. There is an enlarged right hilar lymph node measuring 13 mm short axis. Visualized esophagus and thyroid gland are within normal limits. Lungs/Pleura: Mild emphysematous changes are present. There is no focal lung infiltrate, pleural effusion or pneumothorax. There is minimal atelectasis in the lung bases. Trachea and central airways are patent. Upper Abdomen: No acute abnormality. Musculoskeletal: Degenerative changes affect the spine Review of the MIP images confirms the above findings. IMPRESSION: 1. No evidence for pulmonary embolism. 2. No acute cardiopulmonary process. 3. Mediastinal and right hilar  lymphadenopathy of uncertain etiology. Aortic Atherosclerosis (ICD10-I70.0) and Emphysema (ICD10-J43.9). Electronically Signed   By: Ronney Asters M.D.   On: 04/29/2022 18:54   DG Chest 2 View  Result Date: 04/29/2022 CLINICAL DATA:  Sepsis. Shortness of breath, fever and productive cough for 3 days. EXAM: CHEST - 2 VIEW COMPARISON:  Chest x-ray 12/11/2020 FINDINGS: The cardiac silhouette, mediastinal and hilar contours are within normal limits and stable. The lungs are clear of an acute process. No infiltrates, edema or effusions. Stable underlying emphysematous changes and hyperinflation. No pulmonary lesions or pneumothorax. The bony thorax is intact. IMPRESSION: Emphysematous changes but no acute pulmonary findings. Electronically Signed   By: Marijo Sanes M.D.   On: 04/29/2022 16:42    Microbiology: Results for orders placed or performed during the hospital encounter of 04/29/22  Resp panel by RT-PCR (RSV, Flu A&B, Covid) Anterior Nasal Swab     Status: None   Collection Time: 04/29/22  3:43 PM   Specimen: Anterior Nasal Swab  Result Value Ref Range Status   SARS Coronavirus 2 by RT PCR NEGATIVE NEGATIVE Final    Comment: (NOTE) SARS-CoV-2 target nucleic acids are NOT DETECTED.  The SARS-CoV-2 RNA is generally detectable in upper respiratory specimens during the acute phase of infection. The lowest concentration of SARS-CoV-2 viral copies this assay can detect is 138 copies/mL. A negative result does not preclude SARS-Cov-2 infection and should not be used as the sole basis for treatment or other patient management decisions. A negative result may occur with  improper specimen collection/handling, submission of specimen other than nasopharyngeal swab, presence of viral mutation(s) within the areas targeted by this assay, and inadequate number of viral copies(<138 copies/mL). A negative result must be combined with clinical observations, patient history, and  epidemiological information. The expected result is Negative.  Fact Sheet for Patients:  EntrepreneurPulse.com.au  Fact Sheet for Healthcare Providers:  IncredibleEmployment.be  This test is no t yet approved or cleared by the [place] FDA and  has been authorized for detection and/or diagnosis of SARS-CoV-2 by FDA under an Emergency Use Authorization (EUA). This EUA will remain  in effect (meaning this test can be used) for the duration of the COVID-19 declaration under Section 564(b)(1) of the Act, 21 U.S.C.section 360bbb-3(b)(1), unless the authorization is terminated  or revoked sooner.       Influenza A by PCR NEGATIVE NEGATIVE Final   Influenza B by PCR NEGATIVE NEGATIVE Final    Comment: (NOTE) The Xpert Xpress SARS-CoV-2/FLU/RSV plus assay is intended as an aid in the diagnosis of influenza from Nasopharyngeal swab specimens and should not be used as a sole basis for treatment. Nasal washings and aspirates are unacceptable for Xpert Xpress SARS-CoV-2/FLU/RSV testing.  Fact Sheet for Patients: EntrepreneurPulse.com.au  Fact Sheet for Healthcare Providers: IncredibleEmployment.be  This test is not yet approved or cleared by the [place] FDA and has been authorized for detection and/or diagnosis of SARS-CoV-2 by FDA under  an Emergency Use Authorization (EUA). This EUA will remain in effect (meaning this test can be used) for the duration of the COVID-19 declaration under Section 564(b)(1) of the Act, 21 U.S.C. section 360bbb-3(b)(1), unless the authorization is terminated or revoked.     Resp Syncytial Virus by PCR NEGATIVE NEGATIVE Final    Comment: (NOTE) Fact Sheet for Patients: EntrepreneurPulse.com.au  Fact Sheet for Healthcare Providers: IncredibleEmployment.be  This test is not yet approved or cleared by the [place] FDA and has been  authorized for detection and/or diagnosis of SARS-CoV-2 by FDA under an Emergency Use Authorization (EUA). This EUA will remain in effect (meaning this test can be used) for the duration of the COVID-19 declaration under Section 564(b)(1) of the Act, 21 U.S.C. section 360bbb-3(b)(1), unless the authorization is terminated or revoked.  Performed at [facility], [place] [address] [place], [place] [address]   Culture, blood (Routine x 2)     Status: Abnormal   Collection Time: 04/29/22  3:48 PM   Specimen: BLOOD LEFT HAND  Result Value Ref Range Status   Specimen Description   Final    BLOOD LEFT HAND Performed at [facility], [place] [address] [place], [place] [address]    Special Requests   Final    BOTTLES DRAWN AEROBIC AND ANAEROBIC Blood Culture adequate volume Performed at [place] [address], [place], [place] [address]    Culture  Setup Time   Final    GRAM POSITIVE COCCI IN CLUSTERS ANAEROBIC BOTTLE ONLY CRITICAL RESULT CALLED TO, READ BACK BY AND VERIFIED WITH: PHARMD LEANN POINDEXTER ON 04/30/22 @ 2236 BY DRT    Culture (A)  Final    STAPHYLOCOCCUS EPIDERMIDIS THE SIGNIFICANCE OF ISOLATING THIS ORGANISM FROM A SINGLE SET OF BLOOD CULTURES WHEN MULTIPLE SETS ARE DRAWN IS UNCERTAIN. PLEASE NOTIFY THE MICROBIOLOGY DEPARTMENT WITHIN ONE WEEK IF SPECIATION AND SENSITIVITIES ARE REQUIRED. Performed at [facility], [place] [address], [place], [place] [address]    Report Status 05/02/2022 FINAL  Final  Blood Culture ID Panel (Reflexed)     Status: Abnormal   Collection Time: 04/29/22  3:48 PM  Result Value Ref Range Status   Enterococcus faecalis NOT DETECTED NOT DETECTED Final   Enterococcus Faecium NOT DETECTED NOT DETECTED Final   Listeria monocytogenes NOT DETECTED NOT DETECTED Final   Staphylococcus species DETECTED (A) NOT DETECTED Final    Comment: CRITICAL RESULT CALLED TO, READ BACK BY AND VERIFIED WITH: PHARMD  LEANN POINDEXTER ON 04/30/22 @ 2236 BY DRT    Staphylococcus aureus (BCID) NOT DETECTED NOT DETECTED Final   Staphylococcus epidermidis DETECTED (A) NOT DETECTED Final    Comment: CRITICAL RESULT CALLED TO, READ BACK BY AND VERIFIED WITH: PHARMD LEANN POINDEXTER ON 04/30/22 @ 2236 BY DRT    Staphylococcus lugdunensis NOT DETECTED NOT DETECTED Final   Streptococcus species NOT DETECTED NOT DETECTED Final   Streptococcus agalactiae NOT DETECTED NOT DETECTED Final   Streptococcus pneumoniae NOT DETECTED NOT DETECTED Final   Streptococcus pyogenes NOT DETECTED NOT DETECTED Final   A.calcoaceticus-baumannii NOT DETECTED NOT DETECTED Final   Bacteroides fragilis NOT DETECTED NOT DETECTED Final   Enterobacterales NOT DETECTED NOT DETECTED Final   Enterobacter cloacae complex NOT DETECTED NOT DETECTED Final   Escherichia coli NOT DETECTED NOT DETECTED Final   Klebsiella aerogenes NOT DETECTED NOT DETECTED Final   Klebsiella oxytoca NOT DETECTED NOT DETECTED Final   Klebsiella pneumoniae NOT DETECTED NOT DETECTED Final  Proteus species NOT DETECTED NOT DETECTED Final   Salmonella species NOT DETECTED NOT DETECTED Final   Serratia marcescens NOT DETECTED NOT DETECTED Final   Haemophilus influenzae NOT DETECTED NOT DETECTED Final   Neisseria meningitidis NOT DETECTED NOT DETECTED Final   Pseudomonas aeruginosa NOT DETECTED NOT DETECTED Final   Stenotrophomonas maltophilia NOT DETECTED NOT DETECTED Final   Candida albicans NOT DETECTED NOT DETECTED Final   Candida auris NOT DETECTED NOT DETECTED Final   Candida glabrata NOT DETECTED NOT DETECTED Final   Candida krusei NOT DETECTED NOT DETECTED Final   Candida parapsilosis NOT DETECTED NOT DETECTED Final   Candida tropicalis NOT DETECTED NOT DETECTED Final   Cryptococcus neoformans/gattii NOT DETECTED NOT DETECTED Final   Methicillin resistance mecA/C NOT DETECTED NOT DETECTED Final    Comment: Performed at [facility], [address] [place], [place] [address]  Respiratory (~20 pathogens) panel by PCR     Status: Abnormal   Collection Time: 04/29/22  9:08 PM   Specimen: Nasopharyngeal Swab; Respiratory  Result Value Ref Range Status   Adenovirus NOT DETECTED NOT DETECTED Final   Coronavirus 229E NOT DETECTED NOT DETECTED Final    Comment: (NOTE) The Coronavirus on the Respiratory Panel, DOES NOT test for the novel  Coronavirus (2019 nCoV)    Coronavirus HKU1 NOT DETECTED NOT DETECTED Final   Coronavirus NL63 NOT DETECTED NOT DETECTED Final   Coronavirus OC43 NOT DETECTED NOT DETECTED Final   Metapneumovirus NOT DETECTED NOT DETECTED Final   Rhinovirus / Enterovirus NOT DETECTED NOT DETECTED Final   Influenza A NOT DETECTED NOT DETECTED Final   Influenza B NOT DETECTED NOT DETECTED Final   Parainfluenza Virus 1 NOT DETECTED NOT DETECTED Final   Parainfluenza Virus 2 NOT DETECTED NOT DETECTED Final   Parainfluenza Virus 3 DETECTED (A) NOT DETECTED Final   Parainfluenza Virus 4 NOT DETECTED NOT DETECTED Final   Respiratory Syncytial Virus NOT DETECTED NOT DETECTED Final   Bordetella pertussis NOT DETECTED NOT DETECTED Final   Bordetella Parapertussis NOT DETECTED NOT DETECTED Final   Chlamydophila pneumoniae NOT DETECTED NOT DETECTED Final   Mycoplasma pneumoniae NOT DETECTED NOT DETECTED Final    Comment: Performed at [facility], [place]. [address] [place], [place] [address]  Expectorated Sputum Assessment w Gram Stain, Rflx to Resp Cult     Status: None   Collection Time: 04/30/22  8:37 AM   Specimen: Sputum  Result Value Ref Range Status   Specimen Description SPUTUM  Final   Special Requests NONE  Final   Sputum evaluation   Final    Sputum specimen not acceptable for testing.  Please recollect.   Performed at [facility], [place] [address] [place], [place] [address]    Report Status 04/30/2022 FINAL  Final    Labs: CBC: Recent Labs  Lab 04/29/22 1543 04/29/22 2014  04/30/22 0050 05/01/22 0453 05/02/22 0546  WBC 4.0 3.5* 3.1* 10.2 9.1  NEUTROABS 2.2  --   --  8.1* 6.1  HGB 16.1 14.3 15.0 14.5 14.9  HCT 48.5 43.4 44.5 43.6 44.6  MCV 103.0* 102.4* 101.4* 100.7* 101.6*  PLT 140* 112* 118* 115* [id number]*   Basic Metabolic Panel: Recent Labs  Lab 04/29/22 1543 04/29/22 2014 04/30/22 0050 05/01/22 0453 05/02/22 0546  NA 137  --  134* 132* 135  K 4.5  --  3.8 4.6 4.0  CL 102  --  101 102 103  CO2 26  --  $'23 23 25  'v$ GLUCOSE 114*  --  188* 137* 112*  BUN 19  --  18 25* 27*  CREATININE 1.24 1.15 1.12 0.93 0.93  CALCIUM 9.1  --  8.5* 8.6* 8.8*   Liver Function Tests: Recent Labs  Lab 04/29/22 1543 04/30/22 0050  AST 36 31  ALT 20 18  ALKPHOS 78 68  BILITOT 0.9 0.8  PROT 8.3* 7.0  ALBUMIN 4.3 3.9   CBG: No results for input(s): "GLUCAP" in the last 168 hours.  Discharge time spent: greater than 30 minutes.  Signed: Alma Friendly, MD [facility] 05/02/2022

## 2022-05-02 NOTE — TOC Progression Note (Addendum)
Transition of Care [facility]) - Progression Note    Patient Details  Name: Halston Parro MRN: [id number] Date of Birth: December 02, 1950  [facility] CM/SW Contact  Raquelle Pietro, Juliann Pulse, RN Phone Number: 05/02/2022, 12:35 PM  Clinical Narrative: Substance abuse resources added to d/c instructions. Bus pass in shadow chart-Nsg aware. No further CM needs.    Expected Discharge Plan: Home/Self Care Barriers to Discharge: Continued Medical Work up  Expected Discharge Plan and Services   Discharge Planning Services: CM Consult   Living arrangements for the past 2 months: Single Family Home                                       Social Determinants of Health (SDOH) Interventions SDOH Screenings   Food Insecurity: No Food Insecurity (04/29/2022)  Housing: Medium Risk (04/29/2022)  Transportation Needs: No Transportation Needs (04/29/2022)  Utilities: Not At Risk (04/29/2022)  Tobacco Use: High Risk (04/29/2022)    Readmission Risk Interventions     No data to display

## 2022-05-02 NOTE — Plan of Care (Signed)

## 2022-05-04 LAB — CULTURE, BLOOD (ROUTINE X 2)
Culture: NO GROWTH
Special Requests: ADEQUATE

## 2022-05-05 ENCOUNTER — Telehealth: Payer: Self-pay

## 2022-05-05 NOTE — Transitions of Care (Post Inpatient/ED Visit) (Unsigned)
   05/05/2022  Name: Hunter Mcmillan MRN: [id number] DOB: Sep 14, 1950  Today's TOC FU Call Status: Today's TOC FU Call Status:: Unsuccessul Call (1st Attempt) Unsuccessful Call (1st Attempt) Date: 05/05/22  Attempted to reach the patient regarding the most recent Inpatient/ED visit.  Follow Up Plan: Additional outreach attempts will be made to reach the patient to complete the Transitions of Care (Post Inpatient/ED visit) call.   Signature Juanda Crumble, [place] Direct Dial [phone]

## 2022-05-06 NOTE — Transitions of Care (Post Inpatient/ED Visit) (Unsigned)
   05/06/2022  Name: Hunter Mcmillan MRN: [id number] DOB: 1950-11-21  Today's TOC FU Call Status: Today's TOC FU Call Status:: Unsuccessful Call (2nd Attempt) Unsuccessful Call (1st Attempt) Date: 05/05/22 Unsuccessful Call (2nd Attempt) Date: 05/06/22  Attempted to reach the patient regarding the most recent Inpatient/ED visit.  Follow Up Plan: Additional outreach attempts will be made to reach the patient to complete the Transitions of Care (Post Inpatient/ED visit) call.   Signature Juanda Crumble, [place] Direct Dial [phone]

## 2022-05-07 ENCOUNTER — Emergency Department (HOSPITAL_COMMUNITY): Payer: PRIVATE HEALTH INSURANCE

## 2022-05-07 ENCOUNTER — Emergency Department (HOSPITAL_COMMUNITY)
Admission: EM | Admit: 2022-05-07 | Discharge: 2022-05-07 | Disposition: A | Discharge status: Home / Self Care | Payer: PRIVATE HEALTH INSURANCE | Attending: Emergency Medicine | Admitting: Emergency Medicine

## 2022-05-07 ENCOUNTER — Encounter (HOSPITAL_COMMUNITY): Payer: Self-pay

## 2022-05-07 ENCOUNTER — Other Ambulatory Visit: Payer: Self-pay

## 2022-05-07 DIAGNOSIS — J441 Chronic obstructive pulmonary disease with (acute) exacerbation: Secondary | ICD-10-CM | POA: Insufficient documentation

## 2022-05-07 DIAGNOSIS — Z7982 Long term (current) use of aspirin: Secondary | ICD-10-CM | POA: Insufficient documentation

## 2022-05-07 DIAGNOSIS — R059 Cough, unspecified: Secondary | ICD-10-CM | POA: Diagnosis present

## 2022-05-07 DIAGNOSIS — R051 Acute cough: Secondary | ICD-10-CM | POA: Insufficient documentation

## 2022-05-07 MED ORDER — PREDNISONE 20 MG PO TABS
60.0000 mg | ORAL_TABLET | Freq: Once | ORAL | Status: AC
  Administered 2022-05-07: 60 mg via ORAL
  Filled 2022-05-07: qty 3

## 2022-05-07 MED ORDER — PREDNISONE 10 MG PO TABS: ORAL_TABLET | ORAL | 0 refills | Status: AC

## 2022-05-07 MED ORDER — ALBUTEROL SULFATE HFA 108 (90 BASE) MCG/ACT IN AERS: 1.0000 | INHALATION_SPRAY | Freq: Four times a day (QID) | RESPIRATORY_TRACT | 1 refills | Status: DC | PRN

## 2022-05-07 MED ORDER — IPRATROPIUM-ALBUTEROL 0.5-2.5 (3) MG/3ML IN SOLN
3.0000 mL | Freq: Once | RESPIRATORY_TRACT | Status: AC
  Administered 2022-05-07: 3 mL via RESPIRATORY_TRACT
  Filled 2022-05-07: qty 3

## 2022-05-07 MED ORDER — PREDNISONE 10 MG PO TABS: ORAL_TABLET | ORAL | 0 refills | Status: DC

## 2022-05-07 NOTE — ED Provider Notes (Signed)
[place] EMERGENCY DEPARTMENT AT [facility] Provider Note   CSN: [id number] Arrival date & time: 05/07/22  1026     History  Chief Complaint  Patient presents with   Cough    Hunter Mcmillan is a 72 y.o. male.  72 year old male with prior medical history as detailed below presents for evaluation.  Patient reports treatment for COPD exacerbation last week.  Patient was on steroids and azithromycin.  Patient reports that the majority of his symptoms improved.  However, he stopped taking his prednisone approximately 2 days ago.  Since then he has noticed increased cough and mildly increased wheezing.  He denies fever.  He denies significant shortness of breath.  He denies chest pain.  He reports that his symptoms are transiently improved with use of albuterol MDI.  The history is provided by the patient and medical records.       Home Medications Prior to Admission medications   Medication Sig Start Date End Date Taking? Authorizing Provider  albuterol (VENTOLIN HFA) 108 (90 Base) MCG/ACT inhaler Inhale 1-2 puffs into the lungs every 6 (six) hours as needed for wheezing or shortness of breath. 05/07/22  Yes Valarie Merino, MD  predniSONE (DELTASONE) 10 MG tablet Take 4 tablets (40 mg total) by mouth daily for 2 days, THEN 3 tablets (30 mg total) daily for 2 days, THEN 2 tablets (20 mg total) daily for 2 days, THEN 1 tablet (10 mg total) daily for 2 days, THEN 0.5 tablets (5 mg total) daily for 2 days. 05/07/22 05/17/22 Yes MessickWallis Bamberg, MD  acetaminophen (TYLENOL) 500 MG tablet Take 1 tablet (500 mg total) by mouth every 6 (six) hours as needed for mild pain, fever or headache. 05/13/19   Arrien, Jimmy Picket, MD  albuterol (VENTOLIN HFA) 108 (90 Base) MCG/ACT inhaler Inhale 2 puffs into the lungs every 6 (six) hours as needed for wheezing or shortness of breath. 05/02/22   Alma Friendly, MD  aspirin EC 81 MG tablet Take 81 mg by mouth daily at 6 PM.    [provider]  atorvastatin (LIPITOR) 80 MG tablet Take 1 tablet (80 mg total) by mouth daily at 6 PM. 05/21/20   Copland, Gay Filler, MD  carvedilol (COREG) 6.25 MG tablet Take 1 tablet (6.25 mg total) by mouth 2 (two) times daily. 05/28/20   Leanor Kail, PA  diphenhydrAMINE (BENADRYL) 25 mg capsule Take 25 mg by mouth as needed for allergies.    [provider]  ezetimibe (ZETIA) 10 MG tablet Take 1 tablet (10 mg total) by mouth daily. 05/02/22 06/01/22  Alma Friendly, MD  furosemide (LASIX) 20 MG tablet Take 1 tablet (20 mg total) by mouth daily. Patient taking differently: Take 20 mg by mouth daily at 6 PM. 05/28/20   Bhagat, Bhavinkumar, PA  mometasone-formoterol (DULERA) 100-5 MCG/ACT AERO Inhale 2 puffs into the lungs 2 (two) times daily. 05/02/22   Alma Friendly, MD  Multiple Vitamin (MULTIVITAMIN WITH MINERALS) TABS tablet Take 1 tablet by mouth daily at 6 PM.    [provider]  nitroGLYCERIN (NITROSTAT) 0.4 MG SL tablet Place 1 tablet (0.4 mg total) under the tongue every 5 (five) minutes as needed for chest pain. 05/02/22   Alma Friendly, MD  ondansetron (ZOFRAN ODT) 4 MG disintegrating tablet Take 1 tablet (4 mg total) by mouth 2 (two) times daily as needed for nausea or vomiting. 10/30/20   Britt Bottom, PA-C  sacubitril-valsartan (ENTRESTO) 24-26  MG Take 1 tablet by mouth 2 (two) times daily. 05/02/22 06/01/22  Alma Friendly, MD  tamsulosin (FLOMAX) 0.4 MG CAPS capsule Take 1 capsule (0.4 mg total) by mouth daily. Patient taking differently: Take 0.4 mg by mouth every evening. 05/21/20   Copland, Gay Filler, MD      Allergies    Patient has no known allergies.    Review of Systems   Review of Systems  All other systems reviewed and are negative.   Physical Exam Updated Vital Signs BP 130/87 (BP Location: Right Arm)   Pulse 80   Temp 97.8 F (36.6 C) (Oral)   Resp 20   Ht '5\' 11"'$  (1.803 m)   Wt 72.6 kg   SpO2 97%   BMI 22.32  kg/m  Physical Exam Vitals and nursing note reviewed.  Constitutional:      General: He is not in acute distress.    Appearance: Normal appearance. He is well-developed.  HENT:     Head: Normocephalic and atraumatic.  Eyes:     Conjunctiva/sclera: Conjunctivae normal.     Pupils: Pupils are equal, round, and reactive to light.  Cardiovascular:     Rate and Rhythm: Normal rate and regular rhythm.     Heart sounds: Normal heart sounds.  Pulmonary:     Effort: Pulmonary effort is normal. No respiratory distress.     Breath sounds: Normal breath sounds.  Abdominal:     General: There is no distension.     Palpations: Abdomen is soft.     Tenderness: There is no abdominal tenderness.  Musculoskeletal:        General: No deformity. Normal range of motion.     Cervical back: Normal range of motion and neck supple.  Skin:    General: Skin is warm and dry.  Neurological:     General: No focal deficit present.     Mental Status: He is alert and oriented to person, place, and time.     ED Results / Procedures / Treatments   Labs (all labs ordered are listed, but only abnormal results are displayed) Labs Reviewed - No data to display  EKG None  Radiology DG Chest 2 View  Result Date: 05/07/2022 CLINICAL DATA:  Ongoing cough.  Diagnosed with flu about a week ago. EXAM: CHEST - 2 VIEW COMPARISON:  Chest radiographs 04/29/2022 and 12/11/2020;; CT chest 04/29/2022 FINDINGS: Cardiac silhouette and hilar contours are within normal limits. Moderate calcification within the aortic arch. Chronic bilateral cystic emphysematous changes. No acute airspace opacity. No pleural effusion pneumothorax. Moderate multilevel degenerative disc changes of the thoracic spine. IMPRESSION: No active cardiopulmonary disease. Electronically Signed   By: Yvonne Kendall M.D.   On: 05/07/2022 12:35    Procedures Procedures    Medications Ordered in ED Medications  ipratropium-albuterol (DUONEB) 0.5-2.5 (3)  MG/3ML nebulizer solution 3 mL (3 mLs Nebulization Given 05/07/22 1051)  predniSONE (DELTASONE) tablet 60 mg (60 mg Oral Given 05/07/22 1050)    ED Course/ Medical Decision Making/ A&P                             Medical Decision Making Amount and/or Complexity of Data Reviewed Radiology: ordered.  Risk Prescription drug management.    Medical Screen Complete  This patient presented to the ED with complaint of bronchospasm, cough.  This complaint involves an extensive number of treatment options. The initial differential diagnosis includes, but is not limited  to, bronchospasm, pneumonia, etc.  This presentation is: Chronic, Self-Limited, Previously Undiagnosed, Uncertain Prognosis, Complicated, Systemic Symptoms, and Threat to Life/Bodily Function  Patient with recurrent bronchospasm after recent treatment for COPD exacerbation.  Patient reports symptoms became recurrent after completing course of prednisone 2 days prior.  Patient without fever.  Chest x-ray is without evidence of acute pathology.  Patient feels improved with DuoNeb treatment.    Patient or stands need for close outpatient follow-up.  Strict return precautions given and understood.  Additional history obtained:  External records from outside sources obtained and reviewed including prior ED visits and prior Inpatient records.    Imaging Studies ordered:  I ordered imaging studies including chest x-ray I independently visualized and interpreted obtained imaging which showed NAD I agree with the radiologist interpretation.   Medicines ordered:  I ordered medication including DuoNeb for bronchospasm Reevaluation of the patient after these medicines showed that the patient: improved  Problem List / ED Course:  Bronchospasm   Reevaluation:  After the interventions noted above, I reevaluated the patient and found that they have: improved  Disposition:  After consideration of the diagnostic results  and the patients response to treatment, I feel that the patent would benefit from close outpatient follow-up.          Final Clinical Impression(s) / ED Diagnoses Final diagnoses:  Acute cough    Rx / DC Orders ED Discharge Orders          Ordered    albuterol (VENTOLIN HFA) 108 (90 Base) MCG/ACT inhaler  Every 6 hours PRN        05/07/22 1252    predniSONE (DELTASONE) 10 MG tablet  Daily        05/07/22 1252              Valarie Merino, MD 05/07/22 1332

## 2022-05-07 NOTE — Transitions of Care (Post Inpatient/ED Visit) (Signed)
   05/07/2022  Name: Hunter Mcmillan MRN: [id number] DOB: 1950/03/26  Today's TOC FU Call Status: Today's TOC FU Call Status:: Unsuccessful Call (3rd Attempt) Unsuccessful Call (1st Attempt) Date: 05/05/22 Unsuccessful Call (2nd Attempt) Date: 05/06/22 Unsuccessful Call (3rd Attempt) Date: 05/07/22  Attempted to reach the patient regarding the most recent Inpatient/ED visit.  Follow Up Plan: No further outreach attempts will be made at this time. We have been unable to contact the patient.  Signature Juanda Crumble, [place] Direct Dial [phone]

## 2022-05-07 NOTE — Discharge Instructions (Addendum)
Return for any problem.  ?

## 2022-05-07 NOTE — ED Notes (Signed)
AVS with prescriptions provided to and discussed with patient. Pt verbalizes understanding of discharge instructions and denies any questions or concerns at this time. Pt provided with bus pass per his request. Pt ambulated out of department independently with steady gait.

## 2022-05-07 NOTE — ED Triage Notes (Signed)
Pt BIB [facility] from home C/O ongoing cough since being diagnosed with flu about a week ago. Denies CP, SOB.

## 2022-10-24 ENCOUNTER — Telehealth: Payer: Self-pay | Admitting: Family Medicine

## 2022-10-24 NOTE — Telephone Encounter (Signed)
Patient last seen in 2022- called patient to schedule annual physical

## 2022-12-04 DIAGNOSIS — K8020 Calculus of gallbladder without cholecystitis without obstruction: Secondary | ICD-10-CM | POA: Diagnosis not present

## 2022-12-04 DIAGNOSIS — I714 Abdominal aortic aneurysm, without rupture, unspecified: Secondary | ICD-10-CM | POA: Diagnosis not present

## 2023-07-22 ENCOUNTER — Emergency Department (HOSPITAL_COMMUNITY)

## 2023-07-22 ENCOUNTER — Encounter (HOSPITAL_COMMUNITY): Payer: Self-pay

## 2023-07-22 ENCOUNTER — Emergency Department (HOSPITAL_COMMUNITY)
Admission: EM | Admit: 2023-07-22 | Discharge: 2023-07-23 | Disposition: A | Discharge status: Home / Self Care | Attending: Emergency Medicine | Admitting: Emergency Medicine

## 2023-07-22 ENCOUNTER — Other Ambulatory Visit: Payer: Self-pay

## 2023-07-22 DIAGNOSIS — I719 Aortic aneurysm of unspecified site, without rupture: Secondary | ICD-10-CM | POA: Insufficient documentation

## 2023-07-22 DIAGNOSIS — Z720 Tobacco use: Secondary | ICD-10-CM | POA: Diagnosis not present

## 2023-07-22 DIAGNOSIS — R0602 Shortness of breath: Secondary | ICD-10-CM | POA: Diagnosis present

## 2023-07-22 DIAGNOSIS — Z7982 Long term (current) use of aspirin: Secondary | ICD-10-CM | POA: Insufficient documentation

## 2023-07-22 DIAGNOSIS — Z7951 Long term (current) use of inhaled steroids: Secondary | ICD-10-CM | POA: Insufficient documentation

## 2023-07-22 DIAGNOSIS — J441 Chronic obstructive pulmonary disease with (acute) exacerbation: Secondary | ICD-10-CM | POA: Insufficient documentation

## 2023-07-22 LAB — CBC WITH DIFFERENTIAL/PLATELET
Abs Immature Granulocytes: 0.02 10*3/uL (ref 0.00–0.07)
Basophils Absolute: 0.1 10*3/uL (ref 0.0–0.1)
Basophils Relative: 1 %
Eosinophils Absolute: 0.1 10*3/uL (ref 0.0–0.5)
Eosinophils Relative: 1 %
HCT: 53.5 % — ABNORMAL HIGH (ref 39.0–52.0)
Hemoglobin: 17.8 g/dL — ABNORMAL HIGH (ref 13.0–17.0)
Immature Granulocytes: 0 %
Lymphocytes Relative: 22 %
Lymphs Abs: 1.4 10*3/uL (ref 0.7–4.0)
MCH: 33.1 pg (ref 26.0–34.0)
MCHC: 33.3 g/dL (ref 30.0–36.0)
MCV: 99.6 fL (ref 80.0–100.0)
Monocytes Absolute: 0.5 10*3/uL (ref 0.1–1.0)
Monocytes Relative: 8 %
Neutro Abs: 4.4 10*3/uL (ref 1.7–7.7)
Neutrophils Relative %: 68 %
Platelets: 165 10*3/uL (ref 150–400)
RBC: 5.37 MIL/uL (ref 4.22–5.81)
RDW: 14.6 % (ref 11.5–15.5)
WBC: 6.5 10*3/uL (ref 4.0–10.5)
nRBC: 0 % (ref 0.0–0.2)

## 2023-07-22 LAB — COMPREHENSIVE METABOLIC PANEL WITH GFR
ALT: 15 U/L (ref 0–44)
AST: 24 U/L (ref 15–41)
Albumin: 4.3 g/dL (ref 3.5–5.0)
Alkaline Phosphatase: 72 U/L (ref 38–126)
Anion gap: 9 (ref 5–15)
BUN: 15 mg/dL (ref 8–23)
CO2: 27 mmol/L (ref 22–32)
Calcium: 9.6 mg/dL (ref 8.9–10.3)
Chloride: 101 mmol/L (ref 98–111)
Creatinine, Ser: 1.05 mg/dL (ref 0.61–1.24)
GFR, Estimated: >60 mL/min (ref >60)
Glucose, Bld: 100 mg/dL — ABNORMAL HIGH (ref 70–99)
Potassium: 4.6 mmol/L (ref 3.5–5.1)
Sodium: 137 mmol/L (ref 135–145)
Total Bilirubin: 1.2 mg/dL (ref 0.0–1.2)
Total Protein: 8 g/dL (ref 6.5–8.1)

## 2023-07-22 LAB — TROPONIN I (HIGH SENSITIVITY)
Troponin I (High Sensitivity): 13 ng/L (ref <18)
Troponin I (High Sensitivity): 11 ng/L (ref <18)

## 2023-07-22 LAB — BRAIN NATRIURETIC PEPTIDE: B Natriuretic Peptide: 127.2 pg/mL — ABNORMAL HIGH (ref 0.0–100.0)

## 2023-07-22 MED ORDER — ALBUTEROL SULFATE (2.5 MG/3ML) 0.083% IN NEBU
5.0000 mg | INHALATION_SOLUTION | Freq: Once | RESPIRATORY_TRACT | Status: AC
  Administered 2023-07-22: 5 mg via RESPIRATORY_TRACT
  Filled 2023-07-22: qty 6

## 2023-07-22 MED ORDER — IOHEXOL 350 MG/ML SOLN
75.0000 mL | Freq: Once | INTRAVENOUS | Status: AC | PRN
  Administered 2023-07-22: 75 mL via INTRAVENOUS

## 2023-07-22 MED ORDER — PREDNISONE 50 MG PO TABS: ORAL_TABLET | ORAL | 0 refills | Status: DC

## 2023-07-22 MED ORDER — ALBUTEROL SULFATE HFA 108 (90 BASE) MCG/ACT IN AERS
2.0000 | INHALATION_SPRAY | RESPIRATORY_TRACT | Status: DC
  Administered 2023-07-22: 2 via RESPIRATORY_TRACT
  Filled 2023-07-22: qty 6.7

## 2023-07-22 MED ORDER — METHYLPREDNISOLONE SODIUM SUCC 125 MG IJ SOLR
125.0000 mg | Freq: Once | INTRAMUSCULAR | Status: AC
  Administered 2023-07-22: 125 mg via INTRAVENOUS
  Filled 2023-07-22: qty 2

## 2023-07-22 MED ORDER — IPRATROPIUM BROMIDE 0.02 % IN SOLN
0.5000 mg | Freq: Once | RESPIRATORY_TRACT | Status: AC
  Administered 2023-07-22: 0.5 mg via RESPIRATORY_TRACT
  Filled 2023-07-22: qty 2.5

## 2023-07-22 NOTE — Discharge Instructions (Addendum)
 You have a 4 mm dilated aorta which needs to be monitored every year to make sure it does not get bigger as this can lead to an aortic aneurysm rupture..  Follow-up with your doctor for this to schedule annual testing.

## 2023-07-22 NOTE — ED Provider Notes (Signed)
 [place] EMERGENCY DEPARTMENT AT [facility] Provider Note   CSN: [id number] Arrival date & time: 07/22/23  1534     History  Chief Complaint  Patient presents with   Shortness of Breath    Hunter Mcmillan is a 73 y.o. male.  73 year old male presents with shortness of breath.  Patient has history of COPD.  He continues to use tobacco products.  States that since yesterday he has had increasing dyspnea exertion.  No anginal type symptoms.  Denies any chest pain.  No fever.  Has had some cough.  Has used his inhaler with limited relief.  History of DVT.  No leg pain or swelling.  Called EMS and was transported here       Home Medications Prior to Admission medications   Medication Sig Start Date End Date Taking? Authorizing Provider  acetaminophen  (TYLENOL ) 500 MG tablet Take 1 tablet (500 mg total) by mouth every 6 (six) hours as needed for mild pain, fever or headache. 05/13/19   Arrien, Curlee Doss, MD  albuterol  (VENTOLIN  HFA) 108 [phone] Base) MCG/ACT inhaler Inhale 2 puffs into the lungs every 6 (six) hours as needed for wheezing or shortness of breath. 05/02/22   Ezenduka, Nkeiruka J, MD  albuterol  (VENTOLIN  HFA) [phone] Base) MCG/ACT inhaler Inhale 1-2 puffs into the lungs every 6 (six) hours as needed for wheezing or shortness of breath. 05/07/22   Burnette Carte, MD  aspirin  EC 81 MG tablet Take 81 mg by mouth daily at 6 PM.    [provider]  atorvastatin  (LIPITOR ) 80 MG tablet Take 1 tablet (80 mg total) by mouth daily at 6 PM. 05/21/20   Copland, Skipper Dumas, MD  carvedilol  (COREG ) 6.25 MG tablet Take 1 tablet (6.25 mg total) by mouth 2 (two) times daily. 05/28/20   Bhagat, Bhavinkumar, PA  diphenhydrAMINE (BENADRYL) 25 mg capsule Take 25 mg by mouth as needed for allergies.    [provider]  ezetimibe  (ZETIA ) 10 MG tablet Take 1 tablet (10 mg total) by mouth daily. 05/02/22 06/01/22  Ezenduka, Nkeiruka J, MD  furosemide  (LASIX ) 20 MG  tablet Take 1 tablet (20 mg total) by mouth daily. Patient taking differently: Take 20 mg by mouth daily at 6 PM. 05/28/20   Bhagat, [place], PA  mometasone -formoterol  (DULERA) 100-5 MCG/ACT AERO Inhale 2 puffs into the lungs 2 (two) times daily. 05/02/22   Ezenduka, Nkeiruka J, MD  Multiple Vitamin (MULTIVITAMIN WITH MINERALS) TABS tablet Take 1 tablet by mouth daily at 6 PM.    [provider]  nitroGLYCERIN  (NITROSTAT ) 0.4 MG SL tablet Place 1 tablet (0.4 mg total) under the tongue every 5 (five) minutes as needed for chest pain. 05/02/22   Ezenduka, Nkeiruka J, MD  ondansetron  (ZOFRAN  ODT) 4 MG disintegrating tablet Take 1 tablet (4 mg total) by mouth 2 (two) times daily as needed for nausea or vomiting. 10/30/20   Gawne, Meghan M, PA-C  tamsulosin  (FLOMAX ) 0.4 MG CAPS capsule Take 1 capsule (0.4 mg total) by mouth daily. Patient taking differently: Take 0.4 mg by mouth every evening. 05/21/20   Copland, Jessica C, MD      Allergies    Patient has no known allergies.    Review of Systems   Review of Systems  All other systems reviewed and are negative.   Physical Exam Updated Vital Signs BP 123/77   Pulse 69   Temp 97.7 F (36.5 C) (Oral)   Resp 18   Ht  1.803 m (5\' 11" )   Wt 79.4 kg   SpO2 99%   BMI 24.41 kg/m  Physical Exam Vitals and nursing note reviewed.  Constitutional:      General: He is not in acute distress.    Appearance: Normal appearance. He is well-developed. He is not toxic-appearing.  HENT:     Head: Normocephalic and atraumatic.  Eyes:     General: Lids are normal.     Conjunctiva/sclera: Conjunctivae normal.     Pupils: Pupils are equal, round, and reactive to light.  Neck:     Thyroid : No thyroid  mass.     Trachea: No tracheal deviation.  Cardiovascular:     Rate and Rhythm: Normal rate and regular rhythm.     Heart sounds: Normal heart sounds. No murmur heard.    No gallop.  Pulmonary:     Effort: Pulmonary effort is normal. Tachypnea  present. No respiratory distress.     Breath sounds: No stridor. Examination of the right-upper field reveals decreased breath sounds. Examination of the left-upper field reveals decreased breath sounds. Decreased breath sounds present. No wheezing, rhonchi or rales.  Abdominal:     General: There is no distension.     Palpations: Abdomen is soft.     Tenderness: There is no abdominal tenderness. There is no rebound.  Musculoskeletal:        General: No tenderness. Normal range of motion.     Cervical back: Normal range of motion and neck supple.  Skin:    General: Skin is warm and dry.     Findings: No abrasion or rash.  Neurological:     Mental Status: He is alert and oriented to person, place, and time. Mental status is at baseline.     GCS: GCS eye subscore is 4. GCS verbal subscore is 5. GCS motor subscore is 6.     Cranial Nerves: No cranial nerve deficit.     Sensory: No sensory deficit.     Motor: Motor function is intact.  Psychiatric:        Attention and Perception: Attention normal.        Speech: Speech normal.        Behavior: Behavior normal.     ED Results / Procedures / Treatments   Labs (all labs ordered are listed, but only abnormal results are displayed) Labs Reviewed  COMPREHENSIVE METABOLIC PANEL WITH GFR - Abnormal; Notable for the following components:      Result Value   Glucose, Bld 100 (*)    All other components within normal limits  CBC WITH DIFFERENTIAL/PLATELET - Abnormal; Notable for the following components:   Hemoglobin 17.8 (*)    HCT 53.5 (*)    All other components within normal limits  BRAIN NATRIURETIC PEPTIDE - Abnormal; Notable for the following components:   B Natriuretic Peptide 127.2 (*)    All other components within normal limits  TROPONIN I (HIGH SENSITIVITY)    EKG EKG Interpretation Date/Time:  Wednesday Jul 22 2023 15:44:16 EDT Ventricular Rate:  72 PR Interval:  132 QRS Duration:  113 QT Interval:  420 QTC  Calculation: 460 R Axis:   64  Text Interpretation: Sinus rhythm Probable inferior infarct, old No significant change since last tracing Confirmed by Lind Repine ([id number]) on 07/22/2023 5:06:57 PM  Radiology DG Chest 2 View Result Date: 07/22/2023 CLINICAL DATA:  Shortness of breath. EXAM: CHEST - 2 VIEW COMPARISON:  05/07/2022. FINDINGS: Bilateral lungs appear hyperexpanded and hyperlucent with coarse bronchovascular  markings, in keeping with COPD. Bilateral lungs otherwise appear clear. No dense consolidation or lung collapse. Bilateral costophrenic angles are clear. Normal cardio-mediastinal silhouette. No acute osseous abnormalities. The soft tissues are within normal limits. IMPRESSION: *No active cardiopulmonary disease. COPD. Electronically Signed   By: Beula Brunswick M.D.   On: 07/22/2023 16:40    Procedures Procedures    Medications Ordered in ED Medications  albuterol  (PROVENTIL ) (2.5 MG/3ML) 0.083% nebulizer solution 5 mg (has no administration in time range)  ipratropium (ATROVENT ) nebulizer solution 0.5 mg (has no administration in time range)  methylPREDNISolone  sodium succinate (SOLU-MEDROL ) 125 mg/2 mL injection 125 mg (has no administration in time range)    ED Course/ Medical Decision Making/ A&P                                 Medical Decision Making Risk Prescription drug management.  Chest x-ray shows no acute findings.  COPD noted.  Opponents are flat.  Low suspicion for ACS. Patient is EKG shows normal sinus rhythm.  Butyryl and Atrovent  along with Solu-Medrol  patient feels much better.  Had a CT angio chest was negative for PE but does show a dilated ascending aorta.  Will inform patient he will need routine monitoring of this.  Patient will be placed on prednisone  and will be given a butyryl inhaler to go home with.        Final Clinical Impression(s) / ED Diagnoses Final diagnoses:  None    Rx / DC Orders ED Discharge Orders     None          Lind Repine, MD 07/22/23 2238

## 2023-07-22 NOTE — ED Triage Notes (Signed)
 BIB EMS for shortness of breath for months. Pt states he feels like he cannot get a good breath and c/o chest tightness

## 2023-07-22 NOTE — ED Provider Triage Note (Signed)
 Emergency Medicine Provider Triage Evaluation Note  Carston Riedl , a 73 y.o. male  was evaluated in triage.  Pt complains of SOB. Endorses history of orthopnea that has worsened over the past 2 days. Has history of COPD. Traveled across country in March.  Review of Systems  Positive: SOB, palpitations Negative: Fevers  Physical Exam  BP 123/77   Pulse 69   Temp 97.7 F (36.5 C) (Oral)   Resp 18   Ht 5\' 11"  (1.803 m)   Wt 79.4 kg   SpO2 99%   BMI 24.41 kg/m  Gen:   Awake, no distress   Resp:  Normal effort  MSK:   Moves extremities without difficulty  Other:    Medical Decision Making  Medically screening exam initiated at 3:48 PM.  Appropriate orders placed.  Tiyon Sanor was informed that the remainder of the evaluation will be completed by another provider, this initial triage assessment does not replace that evaluation, and the importance of remaining in the ED until their evaluation is complete.   Sonnie Dusky, PA-C 07/22/23 1552

## 2023-08-10 ENCOUNTER — Other Ambulatory Visit: Payer: Self-pay | Admitting: Family Medicine

## 2023-08-10 DIAGNOSIS — Z8619 Personal history of other infectious and parasitic diseases: Secondary | ICD-10-CM

## 2023-08-11 ENCOUNTER — Telehealth: Payer: Self-pay | Admitting: Cardiology

## 2023-08-11 NOTE — Telephone Encounter (Signed)
 Patient says he is interested in joining a drug and alcohol rehabilitation facility ASAP. He says they need him cleared by cardiology first and he scheduled for tomorrow, 6/11 with Katlyn West, NP.

## 2023-08-11 NOTE — Progress Notes (Unsigned)
 Cardiology Office Note    Date:  08/12/2023  ID:  [name], Hunter Mcmillan 16, 1952, MRN [id number] PCP:  Maryellen Snare, NP  Cardiologist:  Peter Swaziland, MD  Electrophysiologist:  None   Chief Complaint: Shortness of breath   History of Present Illness: .    Hunter Mcmillan is a 73 y.o. male with visit-pertinent history of COPD, tobacco use, CAD s/p DES x 2 of the RCA in 04/2019, ischemic cardiomyopathy with a EF of 30 to 35% in 05/2020, CVA, COPD, hypertension, hyperlipidemia and tobacco abuse.  Patient had a NSTEMI in 2013.  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  Decision was made to manage medically.  In 04/2019 he was admitted with a STEMI and was found to have a 100% occluded proximal RCA and heavy thrombus in the LCx.  He underwent successful PCI with DES x 2 to the RCA and the LCx lesion was treated medically.  Echo at that time showed LVEF of 40 to 45%, he was started on DAPT with aspirin  and Brilinta .  Postprocedure he had recurrent chest pain but relook cath showed patent stents.  He was started on colchicine  for possible pericarditis.  Patient was seen by Dr. Swaziland in 10/2019, at which time he was doing well from a cardiac standpoint.  In 05/2018 patient was admitted with unstable angina.  High sensitive troponin was minimally elevated and flat at 23>>23>>17>>17.  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  Also showed 40% stenosis of ostial left main and 30% stenosis of mid LAD.  No significant change from prior cath.  Continued medical therapy was recommended.  Echo at that time showed LVEF of 30 to 35% with akinesis of the basal to mid inferior and inferolateral walls and grade 2 diastolic dysfunction.  He was restarted on Coreg  as patient had run out prior  to admission and was also started on losartan  and low-dose Lasix  with plan to continue titrating CHF medications as an outpatient.  Unfortunately patient was lost to follow-up.  Patient was last seen on 10/25/2020 for preoperative evaluation.  Patient reported he remained stable from a cardiac standpoint at that time, he denied any chest pain however did have shortness of breath with more vigorous activity, he felt this was stable.  It was noted that he continued to smoke 3 to 4 cigarettes/day.  On chart review patient presented to the emergency department on 07/22/2023 with a COPD exacerbation.  He used his at home albuterol  with limited improvement.  In the ED patient's troponins were flat, EKG showed normal sinus rhythm.  He was given butyryl and Atrovent  along with Solu-Medrol  with significant improvement in breathing.  CT angio of the chest was negative for PE however did show a dilated ascending aorta.  Patient was discharged on a prednisone  taper and follow-up with his PCP.  Today he presents for follow-up.  He reports that he is having worsening shortness of breath with significant mucus production, he notes difficulty laying flat. Patient reports he was started on Trelegy earlier this week without much improvement, was continued on carvedilol  by his PCP.  He does not follow with pulmonology. His PCP placed a cardiac monitor for possible arrhythmia, although patient denies any palpitations. Patient notes significant shortness of breath  when walking to office room as well as dyspnea when talking. He denies any palpitations, lower extremity edema, presyncope or syncope. Patient unsure what medications he is currently taking.  Patient reports that he is trying to enter a rehab facility for drug and alcohol use.  ROS: .   Today he denies chest pain, lower extremity edema, fatigue, palpitations, melena, hematuria, hemoptysis, diaphoresis, weakness, presyncope, syncope, orthopnea, and PND.  All other  systems are reviewed and otherwise negative. Studies Reviewed: Aaron Aas    EKG:  EKG is ordered today, personally reviewed, demonstrating  EKG Interpretation Date/Time:  Wednesday August 12 2023 13:45:46 EDT Ventricular Rate:  78 PR Interval:  128 QRS Duration:  110 QT Interval:  396 QTC Calculation: 451 R Axis:   39  Text Interpretation: Normal sinus rhythm Inferior infarct , age undetermined Confirmed by Jahziel Sinn [id number] on 08/12/2023 5:17:14 PM   CV Studies: Cardiac studies reviewed are outlined and summarized above. Otherwise please see EMR for full report. Cardiac Studies & Procedures   ______________________________________________________________________________________________ CARDIAC CATHETERIZATION  CARDIAC CATHETERIZATION 05/28/2020  Conclusion  Ost LM lesion is 40% stenosed.  Mid LAD lesion is 30% stenosed.  Prox Cx lesion is 90% stenosed.  Prox RCA to Mid RCA lesion is 10% stenosed.  1. Large, dominant RCA with patent overlapping stents in the mid vessel with minimal restenosis 2. 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 The Circumflex is small in caliber and has an unchanged severe stenosis. Vessel is too small for PCI 3. There is an eccentric, mild to moderate mid left main stenosis. This lesion appears to be eccentric and is unchanged from his last cath in 2021.  Recommendations: Continue medical management of CAD  Findings Coronary Findings Diagnostic  Dominance: Right  Left Main Ost LM lesion is 40% stenosed. The lesion is eccentric.  Left Anterior Descending Vessel is large. Mid LAD lesion is 30% stenosed.  Left Circumflex Vessel is small. Prox Cx lesion is 90% stenosed.  Right Coronary Artery Prox RCA to Mid RCA lesion is 10% stenosed. The lesion was previously treated using a drug eluting stent between 1-2 years ago.  Right Posterior Descending Artery Collaterals RPDA filled by collaterals from Dist  LAD.  Intervention  No interventions have been documented.   CARDIAC CATHETERIZATION  CARDIAC CATHETERIZATION 04/24/2019  Conclusion  Prox Cx lesion is 90% stenosed.  Previously placed Prox RCA to Mid RCA stent (unknown type) is widely patent.  1. Continued excellent patency of the RCA stents. The LCx stenosis is chronic and this is a small vessel.  Plan: continue medical management. Will remove femoral sheath manually. Will add colchicine  for possible pericardial component to pain. Prn analgesics.  Findings Coronary Findings Diagnostic  Dominance: Right  Left Circumflex Vessel is small. Prox Cx lesion is 90% stenosed.  Right Coronary Artery Previously placed Prox RCA to Mid RCA stent (unknown type) is widely patent.  Intervention  No interventions have been documented.     ECHOCARDIOGRAM  ECHOCARDIOGRAM COMPLETE 05/28/2020  Narrative ECHOCARDIOGRAM REPORT    Patient Name:   DAMARIA STOFKO Date of Exam: 05/28/2020 Medical Rec #:  [id number]        Height:       71.0 in Accession #:    [id number]       Weight:       144.4 lb Date of Birth:  January 16, 1951        BSA:          1.836 m Patient  Age:    70 years         BP:           108/75 mmHg Patient Gender: M                HR:           69 bpm. Exam Location:  Inpatient  Procedure: 2D Echo, Cardiac Doppler and Color Doppler  Indications:    R07.9* Chest pain, unspecified  History:        Patient has prior history of Echocardiogram examinations, most recent 04/24/2019. Previous Myocardial Infarction and CAD, Stroke; Risk Factors:Current Smoker, Hypertension, Dyslipidemia and ETOH.  Sonographer:    Ruta Cousins RDCS Referring Phys: [id number] Narvis Bad  IMPRESSIONS   1. Left ventricular ejection fraction, by estimation, is 30 to 35%. The left ventricle has moderately decreased function. The left ventricle demonstrates regional wall motion abnormalities with basal to mid inferior and inferolateral  akinesis. Left ventricular diastolic parameters are consistent with Grade II diastolic dysfunction (pseudonormalization). 2. Right ventricular systolic function is mildly reduced. The right ventricular size is normal. Tricuspid regurgitation signal is inadequate for assessing PA pressure. 3. The mitral valve is normal in structure. Trivial mitral valve regurgitation. No evidence of mitral stenosis. 4. The aortic valve is tricuspid. Aortic valve regurgitation is not visualized. Mild to moderate aortic valve sclerosis/calcification is present, without any evidence of aortic stenosis. 5. The inferior vena cava is normal in size with <50% respiratory variability, suggesting right atrial pressure of 8 mmHg.  FINDINGS Left Ventricle: Left ventricular ejection fraction, by estimation, is 30 to 35%. The left ventricle has moderately decreased function. The left ventricle demonstrates regional wall motion abnormalities. The left ventricular internal cavity size was normal in size. There is no left ventricular hypertrophy. Left ventricular diastolic parameters are consistent with Grade II diastolic dysfunction (pseudonormalization).  Right Ventricle: The right ventricular size is normal. No increase in right ventricular wall thickness. Right ventricular systolic function is mildly reduced. Tricuspid regurgitation signal is inadequate for assessing PA pressure.  Left Atrium: Left atrial size was normal in size.  Right Atrium: Right atrial size was normal in size.  Pericardium: There is no evidence of pericardial effusion.  Mitral Valve: The mitral valve is normal in structure. Mild mitral annular calcification. Trivial mitral valve regurgitation. No evidence of mitral valve stenosis.  Tricuspid Valve: The tricuspid valve is normal in structure. Tricuspid valve regurgitation is trivial.  Aortic Valve: The aortic valve is tricuspid. Aortic valve regurgitation is not visualized. Mild to moderate aortic  valve sclerosis/calcification is present, without any evidence of aortic stenosis.  Pulmonic Valve: The pulmonic valve was normal in structure. Pulmonic valve regurgitation is not visualized.  Aorta: The aortic root is normal in size and structure.  Venous: The inferior vena cava is normal in size with less than 50% respiratory variability, suggesting right atrial pressure of 8 mmHg.  IAS/Shunts: No atrial level shunt detected by color flow Doppler.   LEFT VENTRICLE PLAX 2D LVIDd:         5.30 cm      Diastology LVIDs:         4.20 cm      LV e' medial:    6.00 cm/s LV PW:         1.00 cm      LV E/e' medial:  14.4 LV IVS:        1.00 cm      LV e' lateral:   8.43  cm/s LVOT diam:     2.20 cm      LV E/e' lateral: 10.2 LV SV:         47 LV SV Index:   26 LVOT Area:     3.80 cm  LV Volumes (MOD) LV vol d, MOD A2C: 170.0 ml LV vol d, MOD A4C: 101.0 ml LV vol s, MOD A2C: 104.0 ml LV vol s, MOD A4C: 62.2 ml LV SV MOD A2C:     66.0 ml LV SV MOD A4C:     101.0 ml LV SV MOD BP:      52.2 ml  RIGHT VENTRICLE RV S prime:     7.68 cm/s TAPSE (M-mode): 0.9 cm  LEFT ATRIUM             Index       RIGHT ATRIUM           Index LA diam:        3.00 cm 1.63 cm/m  RA Area:     13.30 cm LA Vol (A2C):   39.7 ml 21.62 ml/m RA Volume:   31.10 ml  16.94 ml/m LA Vol (A4C):   33.1 ml 18.03 ml/m LA Biplane Vol: 35.9 ml 19.55 ml/m AORTIC VALVE LVOT Vmax:   62.50 cm/s LVOT Vmean:  50.600 cm/s LVOT VTI:    0.124 m  AORTA Ao Root diam: 3.50 cm  MITRAL VALVE MV Area (PHT): 3.74 cm    SHUNTS MV Decel Time: 203 msec    Systemic VTI:  0.12 m MV E velocity: 86.10 cm/s  Systemic Diam: 2.20 cm MV A velocity: 42.40 cm/s MV E/A ratio:  2.03  Peder Bourdon MD Electronically signed by Peder Bourdon MD Signature Date/Time: 05/28/2020/3:29:56 PM    Final          ______________________________________________________________________________________________       Current Reported  Medications:.    Current Meds  Medication Sig   acetaminophen  (TYLENOL ) 500 MG tablet Take 1 tablet (500 mg total) by mouth every 6 (six) hours as needed for mild pain, fever or headache.   albuterol  (VENTOLIN  HFA) 108 (90 Base) MCG/ACT inhaler Inhale 2 puffs into the lungs every 6 (six) hours as needed for wheezing or shortness of breath.   aspirin  EC 81 MG tablet Take 81 mg by mouth daily at 6 PM.   atorvastatin  (LIPITOR ) 80 MG tablet Take 1 tablet (80 mg total) by mouth daily at 6 PM.   carvedilol  (COREG ) 6.25 MG tablet Take 1 tablet (6.25 mg total) by mouth 2 (two) times daily.   citalopram  (CELEXA ) 20 MG tablet Take 20 mg by mouth daily.   fluticasone  (FLONASE ) 50 MCG/ACT nasal spray Place 2 sprays into both nostrils daily.   furosemide  (LASIX ) 20 MG tablet Take 1 tablet (20 mg total) by mouth daily. (Patient taking differently: Take 20 mg by mouth daily at 6 PM.)   mometasone -formoterol  (DULERA) 100-5 MCG/ACT AERO Inhale 2 puffs into the lungs 2 (two) times daily.   Multiple Vitamin (MULTIVITAMIN WITH MINERALS) TABS tablet Take 1 tablet by mouth daily at 6 PM.   tamsulosin  (FLOMAX ) 0.4 MG CAPS capsule Take 1 capsule (0.4 mg total) by mouth daily. (Patient taking differently: Take 0.4 mg by mouth every evening.)   TRELEGY ELLIPTA 100-62.5-25 MCG/ACT AEPB Inhale 1 puff into the lungs daily.    Physical Exam:    VS:  BP (!) 112/58 (BP Location: Left Arm, Patient Position: Sitting, Cuff Size: Normal)   Pulse 78  Ht 5' 11 (1.803 m)   Wt 168 lb 6.4 oz (76.4 kg)   SpO2 (!) 88%   BMI 23.49 kg/m    Wt Readings from Last 3 Encounters:  08/12/23 165 lb (74.8 kg)  08/12/23 168 lb 6.4 oz (76.4 kg)  07/22/23 175 lb (79.4 kg)    GEN: Well nourished, well developed in no acute distress NECK: No JVD; No carotid bruits CARDIAC: RRR, no murmurs, rubs, gallops RESPIRATORY:  Tachypnea, diminished breath sounds, fine crackles in bases  ABDOMEN: Soft, non-tender, non-distended EXTREMITIES:  No  edema; No acute deformity     Asessement and Plan:.    CAD/Chronic combined CHF/ischemic cardiomyopathy: History of STEMI in 04/2019 s/p DES to RCA.  Cardiac catheterization in 05/2020 showed patent stents with stable disease.  Continue medical therapy was recommended. Echo in 05/2020 showed LVEF of 30 to 35% with akinesis of the basal to mid inferior and inferolateral walls and grade 2 diastolic dysfunction. Today he reports increased shortness of breath that has been progressively worsening in recent weeks, patient reports that he feels as though he needs a nebulizer treatment.  He denies any chest pain, lower extremity edema or weight fluctuation.  Patient overall appears euvolemic, does have diminished lung sounds as well as fine crackles in the bases.  Patient does not believe he has been taking Lasix  although it was listed on his med sheet.  There is noted confusion regarding which medications he is taking.  He notes that his PCP started him on Trelegy earlier this week, that should not be used with carvedilol , given his COPD history recommend discontinuing carvedilol  and starting bisoprolol.  Reviewed with Dr. Addie Holstein, DOD at [facility], street office, patient to be transported by EMS to [facility] given decreases in oxygen saturation and likely COPD exacerbation.  Patient does have a history of reduced EF, would recommend echocardiogram if admitted.   COPD: Patient with history of COPD, numerous ED visits/admissions in recent months.  Patient reports that his breathing has been progressively worsening since recent ED visit, did not feel significant proved on prednisone .  While sitting discussing with patient his oxygen saturation dropped to 85%, became further dyspneic while talking.  Started coughing with significant mucus production followed by minimal improvement in breathing.  Patient notes that he is unable to walk further distances given increased huffing and puffing.  Patient placed on 4 L  nasal cannula with improvement in oxygen saturation to 93%, able to decrease to [facility].  Patient noted feeling significantly better and clearheaded.  Reviewed with Dr. Addie Holstein, DOD at [facility] street office today, recommended referral to [facility] ED for likely COPD exacerbation.   Hypertension: Blood pressure today 112/58.   Tobacco abuse/ETOH abuse: Patient reports increased alcohol use, he is trying to enter a rehab facility.  He notes that appointment today was to see if he was stable enough to start rehab.  Patient to be evaluated in the emergency department for likely COPD exacerbation.  Ascending aortic aneurysm: CT angio of chest indicated fusiform dilation of the ascending aorta measuring 4.1 cm on 07/22/2023.  Will plan for repeat CTA chest/aorta in 1 year.    Disposition: Patient transported to the emergency room via EMS.  Follow-up with cardiology following discharge.  Signed, Joyia Riehle D Salvadore Valvano, NP

## 2023-08-12 ENCOUNTER — Other Ambulatory Visit: Payer: Self-pay

## 2023-08-12 ENCOUNTER — Encounter: Payer: Self-pay | Admitting: Cardiology

## 2023-08-12 ENCOUNTER — Ambulatory Visit: Admitting: Cardiology

## 2023-08-12 ENCOUNTER — Emergency Department (HOSPITAL_COMMUNITY)

## 2023-08-12 ENCOUNTER — Observation Stay (HOSPITAL_COMMUNITY)
Admission: EM | Admit: 2023-08-12 | Discharge: 2023-08-14 | Disposition: A | Discharge status: Home / Self Care | Attending: Emergency Medicine | Admitting: Emergency Medicine

## 2023-08-12 ENCOUNTER — Encounter (HOSPITAL_COMMUNITY): Payer: Self-pay | Admitting: Emergency Medicine

## 2023-08-12 VITALS — BP 112/58 | HR 78 | Ht 71.0 in | Wt 168.4 lb

## 2023-08-12 DIAGNOSIS — I25110 Atherosclerotic heart disease of native coronary artery with unstable angina pectoris: Secondary | ICD-10-CM

## 2023-08-12 DIAGNOSIS — R0602 Shortness of breath: Secondary | ICD-10-CM | POA: Diagnosis present

## 2023-08-12 DIAGNOSIS — I2510 Atherosclerotic heart disease of native coronary artery without angina pectoris: Secondary | ICD-10-CM

## 2023-08-12 DIAGNOSIS — E785 Hyperlipidemia, unspecified: Secondary | ICD-10-CM | POA: Diagnosis present

## 2023-08-12 DIAGNOSIS — F32A Depression, unspecified: Secondary | ICD-10-CM | POA: Insufficient documentation

## 2023-08-12 DIAGNOSIS — J449 Chronic obstructive pulmonary disease, unspecified: Secondary | ICD-10-CM | POA: Diagnosis not present

## 2023-08-12 DIAGNOSIS — I252 Old myocardial infarction: Secondary | ICD-10-CM

## 2023-08-12 DIAGNOSIS — F17220 Nicotine dependence, chewing tobacco, uncomplicated: Secondary | ICD-10-CM | POA: Insufficient documentation

## 2023-08-12 DIAGNOSIS — Z8679 Personal history of other diseases of the circulatory system: Secondary | ICD-10-CM | POA: Insufficient documentation

## 2023-08-12 DIAGNOSIS — Z7982 Long term (current) use of aspirin: Secondary | ICD-10-CM | POA: Insufficient documentation

## 2023-08-12 DIAGNOSIS — I10 Essential (primary) hypertension: Secondary | ICD-10-CM

## 2023-08-12 DIAGNOSIS — F17210 Nicotine dependence, cigarettes, uncomplicated: Secondary | ICD-10-CM | POA: Diagnosis not present

## 2023-08-12 DIAGNOSIS — J441 Chronic obstructive pulmonary disease with (acute) exacerbation: Secondary | ICD-10-CM | POA: Diagnosis not present

## 2023-08-12 DIAGNOSIS — I7121 Aneurysm of the ascending aorta, without rupture: Secondary | ICD-10-CM

## 2023-08-12 DIAGNOSIS — F14920 Cocaine use, unspecified with intoxication, uncomplicated: Secondary | ICD-10-CM | POA: Insufficient documentation

## 2023-08-12 DIAGNOSIS — Z955 Presence of coronary angioplasty implant and graft: Secondary | ICD-10-CM | POA: Diagnosis not present

## 2023-08-12 DIAGNOSIS — K7150 Toxic liver disease with chronic active hepatitis without ascites: Secondary | ICD-10-CM | POA: Diagnosis not present

## 2023-08-12 DIAGNOSIS — E440 Moderate protein-calorie malnutrition: Secondary | ICD-10-CM | POA: Insufficient documentation

## 2023-08-12 DIAGNOSIS — F339 Major depressive disorder, recurrent, unspecified: Secondary | ICD-10-CM | POA: Diagnosis present

## 2023-08-12 DIAGNOSIS — F419 Anxiety disorder, unspecified: Secondary | ICD-10-CM | POA: Diagnosis not present

## 2023-08-12 DIAGNOSIS — Z1152 Encounter for screening for COVID-19: Secondary | ICD-10-CM | POA: Diagnosis not present

## 2023-08-12 DIAGNOSIS — Z720 Tobacco use: Secondary | ICD-10-CM | POA: Diagnosis present

## 2023-08-12 DIAGNOSIS — I428 Other cardiomyopathies: Secondary | ICD-10-CM | POA: Insufficient documentation

## 2023-08-12 DIAGNOSIS — B182 Chronic viral hepatitis C: Secondary | ICD-10-CM | POA: Diagnosis present

## 2023-08-12 LAB — COMPREHENSIVE METABOLIC PANEL WITH GFR
ALT: 22 U/L (ref 0–44)
AST: 20 U/L (ref 15–41)
Albumin: 3.5 g/dL (ref 3.5–5.0)
Alkaline Phosphatase: 62 U/L (ref 38–126)
Anion gap: 15 (ref 5–15)
BUN: 16 mg/dL (ref 8–23)
CO2: 21 mmol/L — ABNORMAL LOW (ref 22–32)
Calcium: 8.8 mg/dL — ABNORMAL LOW (ref 8.9–10.3)
Chloride: 99 mmol/L (ref 98–111)
Creatinine, Ser: 0.91 mg/dL (ref 0.61–1.24)
GFR, Estimated: >60 mL/min (ref >60)
Glucose, Bld: 90 mg/dL (ref 70–99)
Potassium: 4 mmol/L (ref 3.5–5.1)
Sodium: 135 mmol/L (ref 135–145)
Total Bilirubin: 0.9 mg/dL (ref 0.0–1.2)
Total Protein: 6.8 g/dL (ref 6.5–8.1)

## 2023-08-12 LAB — CBC WITH DIFFERENTIAL/PLATELET
Abs Immature Granulocytes: 0.01 10*3/uL (ref 0.00–0.07)
Basophils Absolute: 0 10*3/uL (ref 0.0–0.1)
Basophils Relative: 0 %
Eosinophils Absolute: 0.1 10*3/uL (ref 0.0–0.5)
Eosinophils Relative: 2 %
HCT: 44.9 % (ref 39.0–52.0)
Hemoglobin: 15 g/dL (ref 13.0–17.0)
Immature Granulocytes: 0 %
Lymphocytes Relative: 15 %
Lymphs Abs: 1.1 10*3/uL (ref 0.7–4.0)
MCH: 33.9 pg (ref 26.0–34.0)
MCHC: 33.4 g/dL (ref 30.0–36.0)
MCV: 101.4 fL — ABNORMAL HIGH (ref 80.0–100.0)
Monocytes Absolute: 0.8 10*3/uL (ref 0.1–1.0)
Monocytes Relative: 11 %
Neutro Abs: 5.2 10*3/uL (ref 1.7–7.7)
Neutrophils Relative %: 72 %
Platelets: 146 10*3/uL — ABNORMAL LOW (ref 150–400)
RBC: 4.43 MIL/uL (ref 4.22–5.81)
RDW: 14.6 % (ref 11.5–15.5)
WBC: 7.2 10*3/uL (ref 4.0–10.5)
nRBC: 0 % (ref 0.0–0.2)

## 2023-08-12 LAB — I-STAT CHEM 8, ED
BUN: 18 mg/dL (ref 8–23)
Calcium, Ion: 1.05 mmol/L — ABNORMAL LOW (ref 1.15–1.40)
Chloride: 98 mmol/L (ref 98–111)
Creatinine, Ser: 0.9 mg/dL (ref 0.61–1.24)
Glucose, Bld: 92 mg/dL (ref 70–99)
HCT: 47 % (ref 39.0–52.0)
Hemoglobin: 16 g/dL (ref 13.0–17.0)
Potassium: 3.9 mmol/L (ref 3.5–5.1)
Sodium: 135 mmol/L (ref 135–145)
TCO2: 25 mmol/L (ref 22–32)

## 2023-08-12 LAB — CBC
HCT: 43.2 % (ref 39.0–52.0)
Hemoglobin: 14.9 g/dL (ref 13.0–17.0)
MCH: 34.3 pg — ABNORMAL HIGH (ref 26.0–34.0)
MCHC: 34.5 g/dL (ref 30.0–36.0)
MCV: 99.3 fL (ref 80.0–100.0)
Platelets: 153 10*3/uL (ref 150–400)
RBC: 4.35 MIL/uL (ref 4.22–5.81)
RDW: 14.2 % (ref 11.5–15.5)
WBC: 6 10*3/uL (ref 4.0–10.5)
nRBC: 0 % (ref 0.0–0.2)

## 2023-08-12 LAB — TROPONIN I (HIGH SENSITIVITY)
Troponin I (High Sensitivity): 14 ng/L (ref <18)
Troponin I (High Sensitivity): 15 ng/L (ref <18)

## 2023-08-12 LAB — CREATININE, SERUM
Creatinine, Ser: 1.06 mg/dL (ref 0.61–1.24)
GFR, Estimated: >60 mL/min (ref >60)

## 2023-08-12 LAB — BRAIN NATRIURETIC PEPTIDE: B Natriuretic Peptide: 382.5 pg/mL — ABNORMAL HIGH (ref 0.0–100.0)

## 2023-08-12 MED ORDER — ALBUTEROL SULFATE (2.5 MG/3ML) 0.083% IN NEBU
2.5000 mg | INHALATION_SOLUTION | RESPIRATORY_TRACT | Status: DC | PRN
  Administered 2023-08-13: 2.5 mg via RESPIRATORY_TRACT
  Filled 2023-08-12: qty 3

## 2023-08-12 MED ORDER — METHYLPREDNISOLONE SODIUM SUCC 40 MG IJ SOLR
40.0000 mg | Freq: Two times a day (BID) | INTRAMUSCULAR | Status: AC
  Administered 2023-08-12 – 2023-08-13 (×2): 40 mg via INTRAVENOUS
  Filled 2023-08-12 (×2): qty 1

## 2023-08-12 MED ORDER — METHYLPREDNISOLONE SODIUM SUCC 125 MG IJ SOLR
125.0000 mg | Freq: Once | INTRAMUSCULAR | Status: AC
  Administered 2023-08-12: 125 mg via INTRAVENOUS
  Filled 2023-08-12: qty 2

## 2023-08-12 MED ORDER — PREDNISONE 20 MG PO TABS
40.0000 mg | ORAL_TABLET | Freq: Every day | ORAL | Status: AC
Start: 2023-08-14 — End: 2023-08-18
  Administered 2023-08-14: 40 mg via ORAL
  Filled 2023-08-12: qty 2

## 2023-08-12 MED ORDER — IPRATROPIUM-ALBUTEROL 0.5-2.5 (3) MG/3ML IN SOLN
3.0000 mL | Freq: Four times a day (QID) | RESPIRATORY_TRACT | Status: DC
  Administered 2023-08-13 – 2023-08-14 (×4): 3 mL via RESPIRATORY_TRACT
  Filled 2023-08-12 (×5): qty 3

## 2023-08-12 MED ORDER — ALBUTEROL SULFATE (2.5 MG/3ML) 0.083% IN NEBU
5.0000 mg | INHALATION_SOLUTION | Freq: Once | RESPIRATORY_TRACT | Status: AC
  Administered 2023-08-12: 5 mg via RESPIRATORY_TRACT
  Filled 2023-08-12: qty 6

## 2023-08-12 MED ORDER — ENOXAPARIN SODIUM 40 MG/0.4ML IJ SOSY
40.0000 mg | PREFILLED_SYRINGE | INTRAMUSCULAR | Status: DC
  Administered 2023-08-12 – 2023-08-13 (×2): 40 mg via SUBCUTANEOUS
  Filled 2023-08-12 (×2): qty 0.4

## 2023-08-12 MED ORDER — DOXYCYCLINE HYCLATE 100 MG PO TABS
100.0000 mg | ORAL_TABLET | Freq: Two times a day (BID) | ORAL | Status: AC
Start: 2023-08-12 — End: 2023-08-17
  Administered 2023-08-12 – 2023-08-14 (×4): 100 mg via ORAL
  Filled 2023-08-12 (×4): qty 1

## 2023-08-12 MED ORDER — IPRATROPIUM-ALBUTEROL 0.5-2.5 (3) MG/3ML IN SOLN
3.0000 mL | Freq: Once | RESPIRATORY_TRACT | Status: AC
  Administered 2023-08-12: 3 mL via RESPIRATORY_TRACT
  Filled 2023-08-12: qty 3

## 2023-08-12 NOTE — ED Notes (Signed)
Patient using urinal at bedside

## 2023-08-12 NOTE — ED Notes (Signed)
 ED Provider at bedside.

## 2023-08-12 NOTE — ED Notes (Signed)
 Patient ambulated to restroom.

## 2023-08-12 NOTE — ED Notes (Signed)
 Transported patient to his room.

## 2023-08-12 NOTE — ED Provider Notes (Signed)
 [place] EMERGENCY DEPARTMENT AT [facility] Provider Note   CSN: [id number] Arrival date & time: 08/12/23  1527     History  No chief complaint on file.   Hunter Mcmillan is a 73 y.o. male.  Patient is a 73 year old male who presents with shortness of breath.  He has a history of COPD with ongoing tobacco use, coronary artery disease status post stent placement in 2021, cardiomyopathy with an EF of 30 to 35% with a last echo in 2022.  He has had some worsening shortness of breath over the last month but increased dyspnea over the last 2 weeks.  He has a cough which is productive of clear and yellow sputum.  No known fevers.  No associated chest pain or discomfort.  No leg swelling.  No calf pain.  He went to cardiology office this morning and was noted to be hypoxic with oxygen saturations of 85% on room air.  He was placed on oxygen and is feeling a bit better after that.  He was seen in the ED on May 21 for COPD exacerbation.  He completed a short prednisone  burst following that visit.  He did have a CTA on that visit which showed no evidence of PE.  There was a dilated ascending aorta which will need outpatient monitoring.       Home Medications Prior to Admission medications   Medication Sig Start Date End Date Taking? Authorizing Provider  acetaminophen  (TYLENOL ) 500 MG tablet Take 1 tablet (500 mg total) by mouth every 6 (six) hours as needed for mild pain, fever or headache. 05/13/19   Arrien, Curlee Doss, MD  albuterol  (VENTOLIN  HFA) 108 (90 Base) MCG/ACT inhaler Inhale 2 puffs into the lungs every 6 (six) hours as needed for wheezing or shortness of breath. 05/02/22   Ezenduka, Nkeiruka J, MD  albuterol  (VENTOLIN  HFA) 108 (90 Base) MCG/ACT inhaler Inhale 1-2 puffs into the lungs every 6 (six) hours as needed for wheezing or shortness of breath. 05/07/22   Burnette Carte, MD  aspirin  EC 81 MG tablet Take 81 mg by mouth daily at 6 PM.    [provider]   atorvastatin  (LIPITOR ) 80 MG tablet Take 1 tablet (80 mg total) by mouth daily at 6 PM. 05/21/20   Copland, Jessica C, MD  carvedilol  (COREG ) 6.25 MG tablet Take 1 tablet (6.25 mg total) by mouth 2 (two) times daily. 05/28/20   Bhagat, Annia Kilts, PA  citalopram  (CELEXA ) 20 MG tablet Take 20 mg by mouth daily. 08/10/23   [provider]  diphenhydrAMINE (BENADRYL) 25 mg capsule Take 25 mg by mouth as needed for allergies. Patient not taking: Reported on 08/12/2023    [provider]  ezetimibe  (ZETIA ) 10 MG tablet Take 1 tablet (10 mg total) by mouth daily. 05/02/22 06/01/22  Ezenduka, Nkeiruka J, MD  fluticasone  (FLONASE ) 50 MCG/ACT nasal spray Place 2 sprays into both nostrils daily. 08/10/23   [provider]  furosemide  (LASIX ) 20 MG tablet Take 1 tablet (20 mg total) by mouth daily. Patient taking differently: Take 20 mg by mouth daily at 6 PM. 05/28/20   Bhagat, [place], PA  mometasone -formoterol  (DULERA) 100-5 MCG/ACT AERO Inhale 2 puffs into the lungs 2 (two) times daily. 05/02/22   Ezenduka, Nkeiruka J, MD  Multiple Vitamin (MULTIVITAMIN WITH MINERALS) TABS tablet Take 1 tablet by mouth daily at 6 PM.    [provider]  nitroGLYCERIN  (NITROSTAT ) 0.4 MG SL tablet Place 1 tablet (0.4 mg  total) under the tongue every 5 (five) minutes as needed for chest pain. Patient not taking: Reported on 08/12/2023 05/02/22   Ezenduka, Nkeiruka J, MD  ondansetron  (ZOFRAN  ODT) 4 MG disintegrating tablet Take 1 tablet (4 mg total) by mouth 2 (two) times daily as needed for nausea or vomiting. Patient not taking: Reported on 08/12/2023 10/30/20   Gawne, Meghan M, PA-C  predniSONE  (DELTASONE ) 50 MG tablet 1 p.o. daily x 5 Patient not taking: Reported on 08/12/2023 07/22/23   Lind Repine, MD  tamsulosin  (FLOMAX ) 0.4 MG CAPS capsule Take 1 capsule (0.4 mg total) by mouth daily. Patient taking differently: Take 0.4 mg by mouth every evening. 05/21/20   Copland, Skipper Dumas, MD  TRELEGY  ELLIPTA 100-62.5-25 MCG/ACT AEPB Inhale 1 puff into the lungs daily. 08/10/23   [provider]      Allergies    Patient has no known allergies.    Review of Systems   Review of Systems  Constitutional:  Positive for fatigue. Negative for chills, diaphoresis and fever.  HENT:  Negative for congestion, rhinorrhea and sneezing.   Eyes: Negative.   Respiratory:  Positive for cough, shortness of breath and wheezing. Negative for chest tightness.   Cardiovascular:  Negative for chest pain and leg swelling.  Gastrointestinal:  Negative for abdominal pain, blood in stool, diarrhea, nausea and vomiting.  Genitourinary:  Negative for difficulty urinating, flank pain, frequency and hematuria.  Musculoskeletal:  Negative for arthralgias and back pain.  Skin:  Negative for rash.  Neurological:  Negative for dizziness, speech difficulty, weakness, numbness and headaches.    Physical Exam Updated Vital Signs BP 123/66   Pulse 83   Temp 98.5 F (36.9 C) (Oral)   Resp (!) 21   Ht 5' 11 (1.803 m)   Wt 74.8 kg   SpO2 93%   BMI 23.01 kg/m  Physical Exam Constitutional:      Appearance: He is well-developed.  HENT:     Head: Normocephalic and atraumatic.  Eyes:     Pupils: Pupils are equal, round, and reactive to light.  Cardiovascular:     Rate and Rhythm: Normal rate and regular rhythm.     Heart sounds: Normal heart sounds.     Comments: Zio patch in place to the anterior chest wall Pulmonary:     Effort: Pulmonary effort is normal. No respiratory distress.     Breath sounds: Wheezing present. No rales.  Chest:     Chest wall: No tenderness.  Abdominal:     General: Bowel sounds are normal.     Palpations: Abdomen is soft.     Tenderness: There is no abdominal tenderness. There is no guarding or rebound.  Musculoskeletal:        General: Normal range of motion.     Cervical back: Normal range of motion and neck supple.     Comments: No edema or calf tenderness   Lymphadenopathy:     Cervical: No cervical adenopathy.  Skin:    General: Skin is warm and dry.     Findings: No rash.  Neurological:     Mental Status: He is alert and oriented to person, place, and time.     ED Results / Procedures / Treatments   Labs (all labs ordered are listed, but only abnormal results are displayed) Labs Reviewed  COMPREHENSIVE METABOLIC PANEL WITH GFR - Abnormal; Notable for the following components:      Result Value   CO2 21 (*)  Calcium  8.8 (*)    All other components within normal limits  BRAIN NATRIURETIC PEPTIDE - Abnormal; Notable for the following components:   B Natriuretic Peptide 382.5 (*)    All other components within normal limits  CBC WITH DIFFERENTIAL/PLATELET - Abnormal; Notable for the following components:   MCV 101.4 (*)    Platelets 146 (*)    All other components within normal limits  I-STAT CHEM 8, ED - Abnormal; Notable for the following components:   Calcium , Ion 1.05 (*)    All other components within normal limits  TROPONIN I (HIGH SENSITIVITY)  TROPONIN I (HIGH SENSITIVITY)    EKG None ED ECG REPORT   Date: 08/12/2023  Rate: 72  Rhythm: normal sinus rhythm  QRS Axis: normal  Intervals: normal  ST/T Wave abnormalities: nonspecific ST/T changes  Conduction Disutrbances:none  Narrative Interpretation:   Old EKG Reviewed: unchanged  I have personally reviewed the EKG tracing and agree with the computerized printout as noted.  Radiology DG Chest Port 1 View Result Date: 08/12/2023 CLINICAL DATA:  Shortness of breath. EXAM: PORTABLE CHEST 1 VIEW COMPARISON:  Jul 22, 2023 FINDINGS: The heart size and mediastinal contours are within normal limits. There is no evidence of an acute infiltrate, pleural effusion or pneumothorax. Multilevel degenerative changes are seen throughout the thoracic spine. IMPRESSION: No active cardiopulmonary disease. Electronically Signed   By: Virgle Grime M.D.   On: 08/12/2023 17:44     Procedures Procedures    Medications Ordered in ED Medications  methylPREDNISolone  sodium succinate (SOLU-MEDROL ) 125 mg/2 mL injection 125 mg (125 mg Intravenous Given 08/12/23 1658)  ipratropium-albuterol  (DUONEB) 0.5-2.5 (3) MG/3ML nebulizer solution 3 mL (3 mLs Nebulization Given 08/12/23 1659)  albuterol  (PROVENTIL ) (2.5 MG/3ML) 0.083% nebulizer solution 5 mg (5 mg Nebulization Given 08/12/23 1906)    ED Course/ Medical Decision Making/ A&P                                 Medical Decision Making Amount and/or Complexity of Data Reviewed Labs: ordered. Radiology: ordered.  Risk Prescription drug management. Decision regarding hospitalization.  [facility] CRITICAL CARE Performed by: Hershel Los Total critical care time: 70 minutes Critical care time was exclusive of separately billable procedures and treating other patients. Critical care was necessary to treat or prevent imminent or life-threatening deterioration. Critical care was time spent personally by me on the following activities: development of treatment plan with patient and/or surrogate as well as nursing, discussions with consultants, evaluation of patient's response to treatment, examination of patient, obtaining history from patient or surrogate, ordering and performing treatments and interventions, ordering and review of laboratory studies, ordering and review of radiographic studies, pulse oximetry and re-evaluation of patient's condition.   MDM:    Patient is a 73 year old who presents with wheezing and shortness of breath.  He has diffuse wheezing in all lung fields.  He was on oxygen by nasal cannula on arrival.  Reportedly he had been hypoxic prior to arrival.  He was given nebulizer treatments and Solu-Medrol .  Chest x-ray does not show any evidence of pneumonia.  He does not have chest pain or other symptoms that we more concerning for ACS.  He had a recent CTA of his chest that did not show any evidence of PE  and he does not have other symptoms that would be more concerning for PE.  He overall was feeling better and his lung exam improved  but he still had hypoxia.  Sitting on the bed, after his oxygen was removed, his O2 sats trended down to 88.  He was placed on nasal cannula.  Will plan admission.  Discussed with Dr. Carlton Chick who will admit the patient.  (Labs, imaging, consults)  Labs: I Ordered, and personally interpreted labs.  The pertinent results include: Normal troponins, his BNP is mildly elevated but no signs of pulmonary edema on chest x-ray  Imaging Studies ordered: I ordered imaging studies including chest x-ray I independently visualized and interpreted imaging. I agree with the radiologist interpretation  Additional history obtained from chart.  External records from outside source obtained and reviewed including prior notes  Cardiac Monitoring: The patient was maintained on a cardiac monitor.  If on the cardiac monitor, I personally viewed and interpreted the cardiac monitored which showed an underlying rhythm of: Sinus rhythm  Reevaluation: After the interventions noted above, I reevaluated the patient and found that they have :improved  Social Determinants of Health:  none  Disposition: Admitted to hospital  Co morbidities that complicate the patient evaluation  Past Medical History:  Diagnosis Date   Abdominal pain 05/02/2019   Alcoholism (HCC)    CAD (coronary artery disease)    a. 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 s/p [name] ~ 2003 in [place], [place] => stopped after 6 mos due to neutropenia - viral load noted to be low at that time;  no follow up since;   b. abdominal u/s 11/13:  diffuse hepatic steatosis and/or hepatocellular disease     Hx of echocardiogram    a. Echo 01/06/12: Mild focal basal hypertrophy the septum, vigorous LV, EF 65-70%, Gr 2 diast dysfn, trivial AI, MAC, trivial MR, trivial TR, PASP 36   Hyperlipidemia    Hypertension    Non-STEMI (non-ST elevated myocardial infarction) (HCC) 2019   Stroke (HCC) 2019   minor pt reported   Thrombocytopenia (HCC)    Tobacco abuse      Medicines Meds ordered this encounter  Medications   methylPREDNISolone  sodium succinate (SOLU-MEDROL ) 125 mg/2 mL injection 125 mg   ipratropium-albuterol  (DUONEB) 0.5-2.5 (3) MG/3ML nebulizer solution 3 mL   albuterol  (PROVENTIL ) (2.5 MG/3ML) 0.083% nebulizer solution 5 mg    I have reviewed the patients home medicines and have made adjustments as needed  Problem List / ED Course: Problem List Items Addressed This Visit       Respiratory   * (Principal) COPD exacerbation (HCC) - Primary          Final Clinical Impression(s) / ED Diagnoses Final diagnoses:  COPD exacerbation (HCC)    Rx / DC Orders ED Discharge Orders     None         Hershel Los, MD 08/12/23 2156

## 2023-08-12 NOTE — ED Notes (Signed)
 Breathing treatment complete, patient eating sandwich, drinking beverage, states no other needs at this time.

## 2023-08-12 NOTE — ED Triage Notes (Signed)
 Pt BIB [facility] from [facility] vascular due to [facility].  Steroids and albuterol  treatments have not been helping that PCP prescribed. Several weeks with cough.  Pt reports oxygen does help. VS BP 140/70, HR 72 85%RA; 95% 2L

## 2023-08-12 NOTE — H&P (Signed)
 History and Physical    Patient: Hunter Mcmillan GUY:[id number] DOB: Nov 28, 1950 DOA: 08/12/2023 DOS: the patient was seen and examined on 08/12/2023 PCP: Zoraida Hirschfeld, MD  Patient coming from: Home  Chief Complaint: No chief complaint on file.  HPI: Hunter Mcmillan is a 73 y.o. male with medical history significant of COPD, tobacco abuse, alcohol abuse, polysubstance abuse, coronary artery disease status post previous stent, ischemic cardiomyopathy with a EF of 30 to 35% depression, history of hepatitis C, who presented to the ER with significant shortness of breath cough and wheezing.  Patient also complained of wheezing and productive cough.  His symptoms started gradually over the last 2 weeks.  He has had exertional dyspnea initially but now at rest.  No chest pain no leg swelling.  Patient went to see his cardiologist this morning and was noted to have oxygen sat 85% on room air.  He was placed on oxygen.  Brought to the ER where he was seen evaluated and diagnosed with COPD exacerbation.  He previously had similar presentation in May and diagnosed with the same thing.  He was treated with initial steroids and nebulizer treatment in the ER but still having symptoms.  Patient being admitted to the hospital for observation and management of acute exacerbation of COPD.  Review of Systems: As mentioned in the history of present illness. All other systems reviewed and are negative. Past Medical History:  Diagnosis Date   Abdominal pain 05/02/2019   Alcoholism (HCC)    CAD (coronary artery disease)    a. 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 s/p [name] ~ 2003 in [place], [place] => stopped after 6 mos due to neutropenia - viral load noted to be low at that time;  no follow up since;   b. abdominal u/s 11/13:  diffuse hepatic steatosis and/or hepatocellular disease     Hx of echocardiogram    a. Echo 01/06/12: Mild focal basal hypertrophy the septum, vigorous LV, EF 65-70%, Gr 2 diast dysfn, trivial AI, MAC, trivial MR, trivial TR, PASP 36   Hyperlipidemia    Hypertension    Non-STEMI (non-ST elevated myocardial infarction) (HCC) 2019   Stroke (HCC) 2019   minor pt reported   Thrombocytopenia (HCC)    Tobacco abuse    Past Surgical History:  Procedure Laterality Date   CARPAL TUNNEL RELEASE Right 10/30/2020   Procedure: CARPAL TUNNEL RELEASE;  Surgeon: Saundra Curl, MD;  Location: [facility] ORS;  Service: Orthopedics;  Laterality: Right;   CORONARY ANGIOGRAPHY N/A 04/24/2019   Procedure: CORONARY ANGIOGRAPHY;  Surgeon: Swaziland, Peter M, MD;  Location: [facility] INVASIVE CV LAB;  Service: Cardiovascular;  Laterality: N/A;   CORONARY/GRAFT ACUTE MI REVASCULARIZATION N/A 04/24/2019   Procedure: Coronary/Graft Acute MI Revascularization;  Surgeon: Swaziland, Peter M, MD;  Location: [facility] INVASIVE CV LAB;  Service: Cardiovascular;  Laterality: N/A;   ERCP N/A 05/13/2019  Procedure: ENDOSCOPIC RETROGRADE CHOLANGIOPANCREATOGRAPHY (ERCP);  Surgeon: Genell Ken, MD;  Location: [facility] ENDOSCOPY;  Service: Gastroenterology;  Laterality: N/A;   EYE SURGERY  1954   corrective surgery for crossed eyes   EYE SURGERY  1959   LEFT HEART CATH AND CORONARY ANGIOGRAPHY N/A 04/24/2019   Procedure: LEFT HEART CATH AND CORONARY ANGIOGRAPHY;  Surgeon: Swaziland, Peter M, MD;  Location: [facility] INVASIVE CV LAB;  Service: Cardiovascular;  Laterality: N/A;   LEFT HEART CATH AND CORONARY ANGIOGRAPHY N/A 05/28/2020   Procedure:  LEFT HEART CATH AND CORONARY ANGIOGRAPHY;  Surgeon: Odie Benne, MD;  Location: [facility] INVASIVE CV LAB;  Service: Cardiovascular;  Laterality: N/A;   LEFT HEART CATHETERIZATION WITH CORONARY ANGIOGRAM N/A 01/05/2012   Procedure: LEFT HEART CATHETERIZATION WITH CORONARY ANGIOGRAM;  Surgeon: Darlis Eisenmenger, MD;  Location: [facility] CATH LAB;  Service: Cardiovascular;  Laterality: N/A;   ORIF WRIST FRACTURE Right 10/30/2020   Procedure: OPEN REDUCTION INTERNAL FIXATION (ORIF) WRIST FRACTURE;  Surgeon: Saundra Curl, MD;  Location: [facility];  Service: Orthopedics;  Laterality: Right;   REMOVAL OF STONES  05/13/2019   Procedure: REMOVAL OF STONES;  Surgeon: Genell Ken, MD;  Location: [facility] ENDOSCOPY;  Service: Gastroenterology;;   [address]  05/13/2019   Procedure: [address];  Surgeon: Genell Ken, MD;  Location: [facility] ENDOSCOPY;  Service: Gastroenterology;;   Social History:  reports that he has been smoking cigarettes. He has a 5.5 pack-year smoking history. He has never used smokeless tobacco. He reports that he does not currently use alcohol after a past usage of about 1.0 standard drink of alcohol per week. He reports that he does not currently use drugs after having used the following drugs: Cocaine.  No Known Allergies  Family History  Problem Relation Age of Onset   Heart attack Mother 50   Diabetes Mother    Heart disease Mother    Hyperlipidemia Father    Hypertension Father    Diabetes Maternal Grandfather    Heart disease Maternal Grandfather    Early death Maternal Grandfather    Heart attack Maternal Grandfather    Hearing loss Sister    Hyperlipidemia Sister    Depression Brother    Diabetes Brother    Hyperlipidemia Brother    Early death Maternal Grandmother    Heart disease Maternal Grandmother    Heart attack Maternal Grandmother    Hearing loss Sister     Prior to Admission medications   Medication Sig Start Date End Date Taking? Authorizing Provider   acetaminophen  (TYLENOL ) 500 MG tablet Take 1 tablet (500 mg total) by mouth every 6 (six) hours as needed for mild pain, fever or headache. 05/13/19   Arrien, Curlee Doss, MD  albuterol  (VENTOLIN  HFA) 108 [phone] Base) MCG/ACT inhaler Inhale 2 puffs into the lungs every 6 (six) hours as needed for wheezing or shortness of breath. 05/02/22   Ezenduka, Nkeiruka J, MD  albuterol  (VENTOLIN  HFA) 108 [phone] Base) MCG/ACT inhaler Inhale 1-2 puffs into the lungs every 6 (six) hours as needed for wheezing or shortness of breath. 05/07/22   Burnette Carte, MD  aspirin  EC 81 MG tablet Take 81 mg by mouth daily at 6 PM.    [provider]  atorvastatin  (LIPITOR ) 80 MG tablet Take 1 tablet (80 mg total) by mouth daily at 6 PM. 05/21/20   Copland, Skipper Dumas, MD  carvedilol  (COREG ) 6.25 MG tablet Take 1 tablet (6.25 mg total) by mouth 2 (two) times daily. 05/28/20   Bhagat,  Bhavinkumar, PA  citalopram  (CELEXA ) 20 MG tablet Take 20 mg by mouth daily. 08/10/23   [provider]  diphenhydrAMINE (BENADRYL) 25 mg capsule Take 25 mg by mouth as needed for allergies. Patient not taking: Reported on 08/12/2023    [provider]  ezetimibe  (ZETIA ) 10 MG tablet Take 1 tablet (10 mg total) by mouth daily. 05/02/22 06/01/22  Ezenduka, Nkeiruka J, MD  fluticasone  (FLONASE ) 50 MCG/ACT nasal spray Place 2 sprays into both nostrils daily. 08/10/23   [provider]  furosemide  (LASIX ) 20 MG tablet Take 1 tablet (20 mg total) by mouth daily. Patient taking differently: Take 20 mg by mouth daily at 6 PM. 05/28/20   Bhagat, Shelby, PA  mometasone -formoterol  (DULERA) 100-5 MCG/ACT AERO Inhale 2 puffs into the lungs 2 (two) times daily. 05/02/22   Ezenduka, Nkeiruka J, MD  Multiple Vitamin (MULTIVITAMIN WITH MINERALS) TABS tablet Take 1 tablet by mouth daily at 6 PM.    [provider]  nitroGLYCERIN  (NITROSTAT ) 0.4 MG SL tablet Place 1 tablet (0.4 mg total) under the tongue every 5 (five) minutes as  needed for chest pain. Patient not taking: Reported on 08/12/2023 05/02/22   Ezenduka, Nkeiruka J, MD  ondansetron  (ZOFRAN  ODT) 4 MG disintegrating tablet Take 1 tablet (4 mg total) by mouth 2 (two) times daily as needed for nausea or vomiting. Patient not taking: Reported on 08/12/2023 10/30/20   Gawne, Meghan M, PA-C  predniSONE  (DELTASONE ) 50 MG tablet 1 p.o. daily x 5 Patient not taking: Reported on 08/12/2023 07/22/23   Lind Repine, MD  tamsulosin  (FLOMAX ) 0.4 MG CAPS capsule Take 1 capsule (0.4 mg total) by mouth daily. Patient taking differently: Take 0.4 mg by mouth every evening. 05/21/20   Copland, Skipper Dumas, MD  TRELEGY ELLIPTA 100-62.5-25 MCG/ACT AEPB Inhale 1 puff into the lungs daily. 08/10/23   [provider]    Physical Exam: Vitals:   08/12/23 2045 08/12/23 2054 08/12/23 2100 08/12/23 2200  BP: 123/66   124/63  Pulse: 99 83 83 71  Resp: (!) 22 (!) 23 (!) 21 20  Temp:      TempSrc:      SpO2: 95% 90% 93% 94%  Weight:      Height:       Constitutional: Acutely ill looking, NAD, calm, comfortable Eyes: PERRL, lids and conjunctivae normal ENMT: Mucous membranes are moist. Posterior pharynx clear of any exudate or lesions.Normal dentition.  Neck: normal, supple, no masses, no thyromegaly Respiratory: Decreased air entry bilaterally with moderate expiratory wheezing, no crackles. Normal respiratory effort. No accessory muscle use.  Cardiovascular: Regular rate and rhythm, no murmurs / rubs / gallops. No extremity edema. 2+ pedal pulses. No carotid bruits.  Abdomen: no tenderness, no masses palpated. No hepatosplenomegaly. Bowel sounds positive.  Musculoskeletal: Good range of motion, no joint swelling or tenderness, Skin: no rashes, lesions, ulcers. No induration Neurologic: CN 2-12 grossly intact. Sensation intact, DTR normal. Strength 5/5 in all 4.  Psychiatric: Normal judgment and insight. Alert and oriented x 3. Normal mood  Data Reviewed:  Temperature 98.5,  blood pressure 115/76, pulse is 99 respirate 24 oxygen sat 88% on room air CBC and chemistry largely within normal.  Chest x-ray showed no active cardiopulmonary disease  Assessment and Plan:  #1 acute exacerbation of COPD with hypoxia: Patient will be admitted for treatment of COPD exacerbation.  Initiate steroids, antibiotics and DuoNebs.  Chest x-ray is clear.  No evidence of CHF exacerbation.  Counseling on tobacco cessation.  #  2 nonischemic cardiomyopathy: Last EF of 30% from 2022.  Does not appear to be in acute exacerbation.  Will continue home cardiac regimen  #3 hyperlipidemia: Home, resume home regimen  #4 tobacco abuse: Counseling provided.  Offered nicotine  patch.  #5 chronic hepatitis C: Reported cirrhosis.  No evidence of ascites.  Continue outpatient management  #6 essential hypertension: Will confirm and resume home regimen  #7 depression with anxiety: Stable but mildly anxious.  Confirm a resume home regimen    Advance Care Planning:   Code Status: Prior full code  Consults: None  Family Communication: No family at bedside  Severity of Illness: The appropriate patient status for this patient is OBSERVATION. Observation status is judged to be reasonable and necessary in order to provide the required intensity of service to ensure the patient's safety. The patient's presenting symptoms, physical exam findings, and initial radiographic and laboratory data in the context of their medical condition is felt to place them at decreased risk for further clinical deterioration. Furthermore, it is anticipated that the patient will be medically stable for discharge from the hospital within 2 midnights of admission.   AuthorCarolin Chyle, MD 08/12/2023 10:16 PM  For on call review [email or web address].

## 2023-08-13 DIAGNOSIS — J441 Chronic obstructive pulmonary disease with (acute) exacerbation: Secondary | ICD-10-CM | POA: Diagnosis not present

## 2023-08-13 DIAGNOSIS — F339 Major depressive disorder, recurrent, unspecified: Secondary | ICD-10-CM | POA: Diagnosis not present

## 2023-08-13 DIAGNOSIS — Z720 Tobacco use: Secondary | ICD-10-CM

## 2023-08-13 DIAGNOSIS — B182 Chronic viral hepatitis C: Secondary | ICD-10-CM

## 2023-08-13 DIAGNOSIS — E785 Hyperlipidemia, unspecified: Secondary | ICD-10-CM

## 2023-08-13 DIAGNOSIS — I10 Essential (primary) hypertension: Secondary | ICD-10-CM

## 2023-08-13 DIAGNOSIS — Z955 Presence of coronary angioplasty implant and graft: Secondary | ICD-10-CM

## 2023-08-13 LAB — COMPREHENSIVE METABOLIC PANEL WITH GFR
ALT: 18 U/L (ref 0–44)
AST: 17 U/L (ref 15–41)
Albumin: 2.8 g/dL — ABNORMAL LOW (ref 3.5–5.0)
Alkaline Phosphatase: 83 U/L (ref 38–126)
Anion gap: 7 (ref 5–15)
BUN: 17 mg/dL (ref 8–23)
CO2: 24 mmol/L (ref 22–32)
Calcium: 8.5 mg/dL — ABNORMAL LOW (ref 8.9–10.3)
Chloride: 99 mmol/L (ref 98–111)
Creatinine, Ser: 0.96 mg/dL (ref 0.61–1.24)
GFR, Estimated: >60 mL/min (ref >60)
Glucose, Bld: 218 mg/dL — ABNORMAL HIGH (ref 70–99)
Potassium: 4.5 mmol/L (ref 3.5–5.1)
Sodium: 130 mmol/L — ABNORMAL LOW (ref 135–145)
Total Bilirubin: 0.6 mg/dL (ref 0.0–1.2)
Total Protein: 6.1 g/dL — ABNORMAL LOW (ref 6.5–8.1)

## 2023-08-13 LAB — CBC
HCT: 41.5 % (ref 39.0–52.0)
Hemoglobin: 14.2 g/dL (ref 13.0–17.0)
MCH: 33.6 pg (ref 26.0–34.0)
MCHC: 34.2 g/dL (ref 30.0–36.0)
MCV: 98.3 fL (ref 80.0–100.0)
Platelets: 157 10*3/uL (ref 150–400)
RBC: 4.22 MIL/uL (ref 4.22–5.81)
RDW: 14 % (ref 11.5–15.5)
WBC: 6.3 10*3/uL (ref 4.0–10.5)
nRBC: 0 % (ref 0.0–0.2)

## 2023-08-13 LAB — HIV ANTIBODY (ROUTINE TESTING W REFLEX): HIV Screen 4th Generation wRfx: NONREACTIVE

## 2023-08-13 MED ORDER — ACETAMINOPHEN 500 MG PO TABS
500.0000 mg | ORAL_TABLET | Freq: Four times a day (QID) | ORAL | Status: DC | PRN
  Administered 2023-08-13: 500 mg via ORAL
  Filled 2023-08-13: qty 1

## 2023-08-13 MED ORDER — FLUTICASONE FUROATE-VILANTEROL 100-25 MCG/ACT IN AEPB
1.0000 | INHALATION_SPRAY | Freq: Every day | RESPIRATORY_TRACT | Status: DC
  Filled 2023-08-13: qty 28

## 2023-08-13 MED ORDER — CARVEDILOL 6.25 MG PO TABS
6.2500 mg | ORAL_TABLET | Freq: Two times a day (BID) | ORAL | Status: DC
  Administered 2023-08-13 – 2023-08-14 (×3): 6.25 mg via ORAL
  Filled 2023-08-13 (×3): qty 1

## 2023-08-13 MED ORDER — TAMSULOSIN HCL 0.4 MG PO CAPS
0.4000 mg | ORAL_CAPSULE | Freq: Every evening | ORAL | Status: DC
  Administered 2023-08-13: 0.4 mg via ORAL
  Filled 2023-08-13: qty 1

## 2023-08-13 MED ORDER — ATORVASTATIN CALCIUM 80 MG PO TABS
80.0000 mg | ORAL_TABLET | Freq: Every day | ORAL | Status: DC
  Administered 2023-08-13: 80 mg via ORAL
  Filled 2023-08-13: qty 1

## 2023-08-13 MED ORDER — ADULT MULTIVITAMIN W/MINERALS CH
1.0000 | ORAL_TABLET | Freq: Every day | ORAL | Status: DC
  Administered 2023-08-13: 1 via ORAL
  Filled 2023-08-13: qty 1

## 2023-08-13 MED ORDER — LORATADINE 10 MG PO TABS
10.0000 mg | ORAL_TABLET | Freq: Every day | ORAL | Status: DC
  Administered 2023-08-13 – 2023-08-14 (×2): 10 mg via ORAL
  Filled 2023-08-13 (×2): qty 1

## 2023-08-13 MED ORDER — CITALOPRAM HYDROBROMIDE 20 MG PO TABS
20.0000 mg | ORAL_TABLET | Freq: Every day | ORAL | Status: DC
  Administered 2023-08-13 – 2023-08-14 (×2): 20 mg via ORAL
  Filled 2023-08-13 (×2): qty 1

## 2023-08-13 MED ORDER — ASPIRIN 81 MG PO TBEC
81.0000 mg | DELAYED_RELEASE_TABLET | Freq: Every day | ORAL | Status: DC
  Administered 2023-08-13: 81 mg via ORAL
  Filled 2023-08-13: qty 1

## 2023-08-13 MED ORDER — GUAIFENESIN 100 MG/5ML PO LIQD
5.0000 mL | ORAL | Status: DC | PRN
  Administered 2023-08-13 (×2): 5 mL via ORAL
  Filled 2023-08-13 (×2): qty 15

## 2023-08-13 NOTE — Progress Notes (Signed)
 OT Cancellation Note  Patient Details Name: Hunter Mcmillan MRN: [id number] DOB: 11-02-1950   Cancelled Treatment:    Reason Eval/Treat Not Completed: OT screened, no needs identified, will sign off, per PT Pt is independent and has no acute therapy needs, signing off.  Scherry Curtis 08/13/2023, 11:05 AM

## 2023-08-13 NOTE — Progress Notes (Signed)
 Progress Note   Patient: Hunter Mcmillan UJW:[id number] DOB: 1951/01/07 DOA: 08/12/2023     0 DOS: the patient was seen and examined on 08/13/2023   Brief hospital course: Kentravious Lipford is a 73 y.o. male with medical history significant of COPD, tobacco abuse, alcohol abuse, polysubstance abuse, coronary artery disease status post previous stent, ischemic cardiomyopathy with a EF of 30 to 35% depression, history of hepatitis C, who presented to the ER with significant shortness of breath cough and wheezing. Admitted to TRH service for management of COPD exacerbation.  Assessment and Plan: Acute exacerbation of COPD with hypoxia:   Continue supplemental oxygen to maintain saturation above 92%. He is on steroid taper.  Dulera resumed. Continue antibiotic doxycycline . Antitussives, bronchodilators as needed. Smoking cessation counseled.   Nonischemic cardiomyopathy:  Last EF of 30% from 2022.  Does not appear to be in acute exacerbation.  Continue aspirin , statin, Coreg .   Hyperlipidemia:  Home dose statin resumed.   Tobacco abuse:  Counseling provided.  Offered nicotine  patch.   Chronic hepatitis C:  Reported cirrhosis.  No evidence of ascites.  Continue outpatient management   Essential hypertension:  BP stable. Resumed Coreg .   Depression and anxiety:  Resumed Celexa .        Out of bed to chair. Incentive spirometry. Nursing supportive care. Fall, aspiration precautions. Diet:  Diet Orders (From admission, onward)     Start     Ordered   08/12/23 2256  Diet Heart Room service appropriate? Yes; Fluid consistency: Thin  Diet effective now       Question Answer Comment  Room service appropriate? Yes   Fluid consistency: Thin      08/12/23 2255           DVT prophylaxis: enoxaparin  (LOVENOX ) injection 40 mg Start: 08/12/23 2345  Level of care: Telemetry Medical   Code Status: Full Code  Subjective: Patient is seen and examined today morning.  He is 1-2L supplemental oxygen. Walked with PT, noted hypoxic. Shortness of breath better.  Physical Exam: Vitals:   08/13/23 0039 08/13/23 0416 08/13/23 0828 08/13/23 0850  BP: 109/87 127/65 (!) 144/70   Pulse: 77 72 79   Resp: 19 19 17    Temp: 98.3 F (36.8 C) 98.2 F (36.8 C) 98.6 F (37 C)   TempSrc:      SpO2: 97% 98% 94% 96%  Weight:      Height:        General - Elderly Caucasian male, mild respiratory distress HEENT - PERRLA, EOMI, atraumatic head, non tender sinuses. Lung - Clear, no rales, rhonchi, diffuse wheezes. Heart - S1, S2 heard, no murmurs, rubs, trace pedal edema. Abdomen - Soft, non tender, bowel sounds good Neuro - Alert, awake and oriented x 3, non focal exam. Skin - Warm and dry.  Data Reviewed:      Latest Ref Rng & Units 08/13/2023    5:00 AM 08/12/2023   11:12 PM 08/12/2023    4:56 PM  CBC  WBC 4.0 - 10.5 K/uL 6.3  6.0    Hemoglobin 13.0 - 17.0 g/dL 56.2  13.0  86.5   Hematocrit 39.0 - 52.0 % 41.5  43.2  47.0   Platelets 150 - 400 K/uL 157  153        Latest Ref Rng & Units 08/13/2023    5:00 AM 08/12/2023   11:12 PM 08/12/2023    4:56 PM  BMP  Glucose 70 - 99 mg/dL 784   92  BUN 8 - 23 mg/dL 17   18   Creatinine 1.61 - 1.24 mg/dL 0.96  0.45  4.09   Sodium 135 - 145 mmol/L 130   135   Potassium 3.5 - 5.1 mmol/L 4.5   3.9   Chloride 98 - 111 mmol/L 99   98   CO2 22 - 32 mmol/L 24     Calcium  8.9 - 10.3 mg/dL 8.5      DG Chest Port 1 View Result Date: 08/12/2023 CLINICAL DATA:  Shortness of breath. EXAM: PORTABLE CHEST 1 VIEW COMPARISON:  Jul 22, 2023 FINDINGS: The heart size and mediastinal contours are within normal limits. There is no evidence of an acute infiltrate, pleural effusion or pneumothorax. Multilevel degenerative changes are seen throughout the thoracic spine. IMPRESSION: No active cardiopulmonary disease. Electronically Signed   By: Virgle Grime M.D.   On: 08/12/2023 17:44    Family Communication: Discussed with  patient, she understand and agree. All questions answered.  Disposition: Status is: Observation The patient remains OBS appropriate and will d/c before 2 midnights.  Planned Discharge Destination: Home     Time spent: 38 minutes  Author: Aisha Hove, MD 08/13/2023 12:52 PM Secure chat 7am to 7pm For on call review [email or web address].

## 2023-08-13 NOTE — Plan of Care (Signed)

## 2023-08-13 NOTE — TOC Progression Note (Signed)
 Transition of Care [facility]) - Progression Note    Patient Details  Name: Rudolfo Brandow MRN: [id number] Date of Birth: 12/19/1950  Transition of Care [facility]) CM/SW Contact  Ronni Colace, RN Phone Number: 08/13/2023, 5:43 PM  Clinical Narrative:    Eldora Greet to Mr Mark regarding OBV. Patient was getting oxygen through [facility] street last year, but then he was incarcerated. He is now on house arrest ( ankle bracelet) He does wish to proceed with obtaining [employer] oxygen. Discussed that we could get him qualified and then discuss a agency.Messaged nursing for walk test. Paitent does not need DME, independent ambulation.   TOC will follow        Expected Discharge Plan and Services                                               Social Determinants of Health (SDOH) Interventions SDOH Screenings   Food Insecurity: No Food Insecurity (08/12/2023)  Housing: High Risk (08/12/2023)  Transportation Needs: No Transportation Needs (08/12/2023)  Utilities: Not At Risk (08/12/2023)  Social Connections: Unknown (08/12/2023)  Tobacco Use: High Risk (08/12/2023)    Readmission Risk Interventions     No data to display

## 2023-08-13 NOTE — Care Management Obs Status (Signed)
 MEDICARE OBSERVATION STATUS NOTIFICATION   Patient Details  Name: Kipper Buch MRN: [id number] Date of Birth: [birth year]   Medicare Observation Status Notification Given:  Yes    Ronni Colace, RN 08/13/2023, 5:11 PM

## 2023-08-13 NOTE — Care Management (Signed)
  Transition of Care [facility]) Screening Note   Patient Details  Name: Hunter Mcmillan Date of Birth: 1951-03-01   Transition of Care [facility]) CM/SW Contact:    Ronni Colace, RN Phone Number: 08/13/2023, 9:27 AM    Transition of Care Department [facility]) has reviewed patient and no TOC needs have been identified at this time. We will continue to monitor patient advancement through interdisciplinary progression rounds. If new patient transition needs arise, please place a TOC consult.

## 2023-08-13 NOTE — Evaluation (Signed)
 Physical Therapy Brief Evaluation and Discharge Note Patient Details Name: Hunter Mcmillan MRN: [id number] DOB: 01/26/1951 Today's Date: 08/13/2023   History of Present Illness  73 y.o. male admitted 08/12/23 with SOB, COPD exacerbation. PMHx:COPD, tobacco abuse, alcohol abuse, polysubstance abuse, CAD, ICM with EF 30-35%, depression, hep C  Clinical Impression  Pt pleasant, reports housing insecurity for the last few weeks and desire to pursue substance abuse rehab. Pt able to perform mobility independently on RA with SPO2 >89% throughout even with long hall ambulation. Pt at baseline function and does not require further therapy at this time. Will sign off with pt aware and agreeable, encouraged daily ambulation.        PT Assessment Patient does not need any further PT services  Assistance Needed at Discharge  None    Equipment Recommendations None recommended by PT  Recommendations for Other Services       Precautions/Restrictions Precautions Precautions: None        Mobility  Bed Mobility Rolling: Independent        Transfers Overall transfer level: Independent                      Ambulation/Gait Ambulation/Gait assistance: Independent Gait Distance (Feet): 500 Feet Assistive device: None Gait Pattern/deviations: WFL(Within Functional Limits) Gait Speed: Pace WFL    Home Activity Instructions    Stairs            Modified Rankin (Stroke Patients Only)        Balance Overall balance assessment: No apparent balance deficits (not formally assessed)                        Pertinent Vitals/Pain PT - Brief Vital Signs All Vital Signs Stable: Yes Pain Assessment Pain Assessment: No/denies pain     Home Living Family/patient expects to be discharged to:: Shelter/Homeless             Additional Comments: has been staying in friend's pick up truck, doing odd jobs and going to [facility] or [employer] to bathe    Prior Function  Level of Independence: Independent      UE/LE Assessment   UE ROM/Strength/Tone/Coordination: WFL    LE ROM/Strength/Tone/Coordination: [facility]      Communication   Communication Communication: No apparent difficulties     Cognition Overall Cognitive Status: Appears within functional limits for tasks assessed/performed       General Comments      Exercises     Assessment/Plan    PT Problem List         PT Visit Diagnosis Other abnormalities of gait and mobility (R26.89)    No Skilled PT All education completed;Patient at baseline level of functioning;Patient is independent with all acitivity/mobility   Co-evaluation                AMPAC 6 Clicks Help needed turning from your back to your side while in a flat bed without using bedrails?: None Help needed moving from lying on your back to sitting on the side of a flat bed without using bedrails?: None Help needed moving to and from a bed to a chair (including a wheelchair)?: None Help needed standing up from a chair using your arms (e.g., wheelchair or bedside chair)?: None Help needed to walk in hospital room?: None Help needed climbing 3-5 steps with a railing? : None 6 Click Score: 24      End of Session  Activity Tolerance: Patient tolerated treatment well Patient left: in chair;with call bell/phone within reach Nurse Communication: Mobility status PT Visit Diagnosis: Other abnormalities of gait and mobility (R26.89)     Time: 5784-6962 PT Time Calculation (min) (ACUTE ONLY): 12 min  Charges:   PT Evaluation $PT Eval Low Complexity: 1 Low      Jamarques Pinedo P, PT Acute Rehabilitation Services Office: [phone]   Jackey Mary Tiaira Arambula  08/13/2023, 9:06 AM

## 2023-08-13 NOTE — Progress Notes (Signed)
 Initial Nutrition Assessment  DOCUMENTATION CODES:   Non-severe (moderate) malnutrition in context of social or environmental circumstances  INTERVENTION:   Encourage PO intake at meals/snacks   Discussed easy, low cost food options that don't require refrigeration or cooking   NUTRITION DIAGNOSIS:   Moderate Malnutrition related to social / environmental circumstances as evidenced by moderate muscle depletion, moderate fat depletion.  GOAL:   Patient will meet greater than or equal to 90% of their needs  MONITOR:   PO intake  REASON FOR ASSESSMENT:   Consult Assessment of nutrition requirement/status  ASSESSMENT:   Pt with PMH of COPD, tobacco abuse, alcohol abuse, polysubstance abuse, CAD s/p stent, ischemic cardiomyopathy with EF of 30-35% depression, hepatitis C, and current smoker now admitted with acute exacerbation of COPD.   Pt requiring supplemental O2 but does not usually use this at home.  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  Pt reports better appetite and some weight gain since he is no longer smoking crack (1 month) or drinking alcohol (3 weeks)  Breakfast is usually a sausage biscuit No lunch Dinner is PB&J, chicken salad, or beanie weenies  Pt was down to 140 lb when he was smoking crack and reports now being up to 165 lb.   Meal Completion: 100% of all meals    Medications reviewed and include: solu-medrol   Labs reviewed    NUTRITION - FOCUSED PHYSICAL EXAM:  Flowsheet Row Most Recent Value  Orbital Region Moderate depletion  Upper Arm Region Severe depletion  Thoracic and Lumbar Region Mild depletion  Buccal Region Mild depletion  Temple Region Moderate depletion  Clavicle Bone Region Moderate depletion  Clavicle and Acromion Bone Region Moderate depletion  Scapular Bone Region Unable to assess  Dorsal Hand Mild depletion  Patellar Region Moderate depletion  Anterior Thigh Region Moderate depletion   Posterior Calf Region Moderate depletion  Edema (RD Assessment) None  Hair Reviewed  Eyes Reviewed  Mouth Reviewed  [missing teeth]  Skin Reviewed  Nails Reviewed    Diet Order:   Diet Order             Diet Heart Room service appropriate? Yes; Fluid consistency: Thin  Diet effective now                   EDUCATION NEEDS:   Education needs have been addressed  Skin:  Skin Assessment: Reviewed RN Assessment  Last BM:  6/11  Height:   Ht Readings from Last 1 Encounters:  08/12/23 5' 11 (1.803 m)    Weight:   Wt Readings from Last 1 Encounters:  08/12/23 74.8 kg    BMI:  Body mass index is 23.01 kg/m.  Estimated Nutritional Needs:   Kcal:  1800-2000  Protein:  85-100 grams  Fluid:  >1.8 L/day  Randine Butcher., RD, LDN, CNSC See AMiON for contact information

## 2023-08-14 ENCOUNTER — Other Ambulatory Visit

## 2023-08-14 DIAGNOSIS — F339 Major depressive disorder, recurrent, unspecified: Secondary | ICD-10-CM | POA: Diagnosis not present

## 2023-08-14 DIAGNOSIS — Z720 Tobacco use: Secondary | ICD-10-CM | POA: Diagnosis not present

## 2023-08-14 DIAGNOSIS — E785 Hyperlipidemia, unspecified: Secondary | ICD-10-CM | POA: Diagnosis not present

## 2023-08-14 DIAGNOSIS — B182 Chronic viral hepatitis C: Secondary | ICD-10-CM | POA: Diagnosis not present

## 2023-08-14 DIAGNOSIS — E440 Moderate protein-calorie malnutrition: Secondary | ICD-10-CM | POA: Diagnosis not present

## 2023-08-14 DIAGNOSIS — J441 Chronic obstructive pulmonary disease with (acute) exacerbation: Secondary | ICD-10-CM | POA: Diagnosis not present

## 2023-08-14 MED ORDER — DOXYCYCLINE HYCLATE 100 MG PO TABS: 100.0000 mg | ORAL_TABLET | Freq: Two times a day (BID) | ORAL | 0 refills | Status: AC

## 2023-08-14 MED ORDER — PREDNISONE 20 MG PO TABS: ORAL_TABLET | ORAL | 0 refills | Status: AC

## 2023-08-14 MED ORDER — GUAIFENESIN 100 MG/5ML PO LIQD: 5.0000 mL | ORAL | 0 refills | Status: AC | PRN

## 2023-08-14 NOTE — Discharge Summary (Signed)
 Physician Discharge Summary   Patient: Hunter Mcmillan MRN: [id number] DOB: [birth year]  Admit date:     08/12/2023  Discharge date: 08/14/23  Discharge Physician: Aisha Hove   PCP: Zoraida Hirschfeld, MD   Recommendations at discharge:    PCP follow up in 1 week.  Discharge Diagnoses: Principal Problem:   COPD exacerbation (HCC) Active Problems:   Presence of drug-eluting stent in right coronary artery   Hyperlipidemia with target LDL less than 70   Hx of non-ST elevation myocardial infarction (NSTEMI)   Tobacco abuse   Chronic hepatitis C without hepatic coma (HCC)   Depression, recurrent (HCC)   Essential hypertension   Malnutrition of moderate degree  Resolved Problems:   * No resolved hospital problems. *  Hospital Course: Hunter Mcmillan is a 73 y.o. male with medical history significant of COPD, tobacco abuse, alcohol abuse, polysubstance abuse, coronary artery disease status post previous stent, ischemic cardiomyopathy with a EF of 30 to 35% depression, history of hepatitis C, who presented to the ER with significant shortness of breath cough and wheezing. Admitted to [facility] service for management of COPD exacerbation.   Assessment and Plan: Acute exacerbation of COPD with hypoxia:   He did improved with duonebs, steroids, doxy therapy. Supplemental oxygen tapered off. He ambulated well. Oral steroid taper prescribed. Dulera resumed. Continue antibiotic doxycycline  for 2 more days. Antitussives, bronchodilators as needed. Smoking cessation counseled.   Nonischemic cardiomyopathy:  Last EF of 30% from 2022.  Does not appear to be in acute exacerbation.  Continue aspirin , statin, Coreg .   Hyperlipidemia:  Home dose statin resumed.   Tobacco abuse:  Smoking cessation counseling provided.     Chronic hepatitis C:  Reported cirrhosis.  No evidence of ascites.  Continue outpatient management   Essential hypertension:  BP stable. Resumed  Coreg .   Depression and anxiety:  Resumed Celexa .        Consultants: none Procedures performed: none  Disposition: Home Diet recommendation:  Discharge Diet Orders (From admission, onward)     Start     Ordered   08/14/23 0000  Diet - low sodium heart healthy        08/14/23 1050           Cardiac diet DISCHARGE MEDICATION: Allergies as of 08/14/2023   No Known Allergies      Medication List     STOP taking these medications    Trelegy Ellipta 100-62.5-25 MCG/ACT Aepb Generic drug: Fluticasone -Umeclidin-Vilant       TAKE these medications    acetaminophen  500 MG tablet Commonly known as: TYLENOL  Take 1 tablet (500 mg total) by mouth every 6 (six) hours as needed for mild pain, fever or headache. What changed: when to take this   albuterol  108 (90 Base) MCG/ACT inhaler Commonly known as: VENTOLIN  HFA Inhale 2 puffs into the lungs every 6 (six) hours as needed for wheezing or shortness of breath.   aspirin  EC 81 MG tablet Take 81 mg by mouth daily at 6 PM.   atorvastatin  80 MG tablet Commonly known as: LIPITOR  Take 1 tablet (80 mg total) by mouth daily at 6 PM.   carvedilol  6.25 MG tablet Commonly known as: COREG  Take 1 tablet (6.25 mg total) by mouth 2 (two) times daily.   citalopram  20 MG tablet Commonly known as: CELEXA  Take 20 mg by mouth daily.   doxycycline  100 MG tablet Commonly known as: VIBRA -TABS Take 1 tablet (100 mg total) by mouth every 12 (twelve) hours  for 2 days.   fluticasone  50 MCG/ACT nasal spray Commonly known as: FLONASE  Place 2 sprays into both nostrils daily.   guaiFENesin  100 MG/5ML liquid Commonly known as: ROBITUSSIN Take 5 mLs by mouth every 4 (four) hours as needed for cough or to loosen phlegm.   loratadine  10 MG tablet Commonly known as: CLARITIN  Take 10 mg by mouth daily.   mometasone -formoterol  100-5 MCG/ACT Aero Commonly known as: DULERA Inhale 2 puffs into the lungs 2 (two) times daily.    multivitamin with minerals Tabs tablet Take 1 tablet by mouth daily at 6 PM.   nitroGLYCERIN  0.4 MG SL tablet Commonly known as: NITROSTAT  Place 1 tablet (0.4 mg total) under the tongue every 5 (five) minutes as needed for chest pain.   predniSONE  20 MG tablet Commonly known as: DELTASONE  Take 2 tablets (40 mg total) by mouth daily with breakfast for 2 days, THEN 1 tablet (20 mg total) daily with breakfast for 2 days. Start taking on: August 14, 2023 What changed:  medication strength See the new instructions.   tamsulosin  0.4 MG Caps capsule Commonly known as: FLOMAX  Take 1 capsule (0.4 mg total) by mouth daily. What changed: when to take this        Discharge Exam: Filed Weights   08/12/23 1531  Weight: 74.8 kg      08/14/2023    8:18 AM 08/14/2023    5:11 AM 08/14/2023   12:12 AM  Vitals with BMI  Systolic 138 115 604  Diastolic 80 58 88  Pulse 62 54 60    General - Elderly Caucasian male, mild respiratory distress HEENT - PERRLA, EOMI, atraumatic head, non tender sinuses. Lung - Clear, no rales, rhonchi, diffuse wheezes. Heart - S1, S2 heard, no murmurs, rubs, trace pedal edema. Abdomen - Soft, non tender, bowel sounds good Neuro - Alert, awake and oriented x 3, non focal exam. Skin - Warm and dry.  Condition at discharge: stable  The results of significant diagnostics from this hospitalization (including imaging, microbiology, ancillary and laboratory) are listed below for reference.   Imaging Studies: DG Chest Port 1 View Result Date: 08/12/2023 CLINICAL DATA:  Shortness of breath. EXAM: PORTABLE CHEST 1 VIEW COMPARISON:  Jul 22, 2023 FINDINGS: The heart size and mediastinal contours are within normal limits. There is no evidence of an acute infiltrate, pleural effusion or pneumothorax. Multilevel degenerative changes are seen throughout the thoracic spine. IMPRESSION: No active cardiopulmonary disease. Electronically Signed   By: Virgle Grime M.D.   On:  08/12/2023 17:44   CT Angio Chest PE W and/or Wo Contrast Result Date: 07/22/2023 CLINICAL DATA:  Pulmonary embolism (PE) suspected, high prob, dyspnea EXAM: CT ANGIOGRAPHY CHEST WITH CONTRAST TECHNIQUE: Multidetector CT imaging of the chest was performed using the standard protocol during bolus administration of intravenous contrast. Multiplanar CT image reconstructions and MIPs were obtained to evaluate the vascular anatomy. RADIATION DOSE REDUCTION: This exam was performed according to the departmental dose-optimization program which includes automated exposure control, adjustment of the mA and/or kV according to patient size and/or use of iterative reconstruction technique. CONTRAST:  75mL OMNIPAQUE  IOHEXOL  350 MG/ML SOLN COMPARISON:  None Available. FINDINGS: Cardiovascular: Adequate opacification of the pulmonary arterial tree. No intraluminal filling defect identified to suggest acute pulmonary embolism. Central pulmonary arteries are of normal caliber. Extensive multi-vessel coronary artery calcification. Cardiac size is within normal limits. No pericardial effusion. Moderate atherosclerotic calcification within the thoracic aorta. There is fusiform dilation of the ascending aorta measuring 4.1 cm  in greatest dimension. Dilation of the descending aorta noted measuring 3.4 cm in diameter proximally and 2.9 cm in diameter distally. Mediastinum/Nodes: No enlarged mediastinal, hilar, or axillary lymph nodes. Thyroid  gland, trachea, and esophagus demonstrate no significant findings. Lungs/Pleura: Mild emphysema. Lungs are clear. No pneumothorax or pleural effusion. No central obstructing lesion. Upper Abdomen: No acute abnormality. Musculoskeletal: No acute bone abnormality. No lytic or blastic bone lesion. Osseous structures are age appropriate. Review of the MIP images confirms the above findings. IMPRESSION: 1. No pulmonary embolism. No acute intrathoracic pathology identified. 2. Extensive multi-vessel  coronary artery calcification. 3. Mild emphysema 4. Fusiform dilation of the ascending aorta measuring 4.1 cm in maximal diameter. Recommend annual imaging followup by CTA or MRA. This recommendation follows 2010 ACCF/AHA/AATS/ACR/ASA/SCA/SCAI/SIR/STS/SVM Guidelines for the Diagnosis and Management of Patients with Thoracic Aortic Disease. Circulation. 2010; 121: V784-O962. Aortic aneurysm NOS (ICD10-I71.9) Aortic Atherosclerosis (ICD10-I70.0) and Emphysema (ICD10-J43.9). Electronically Signed   By: Worthy Heads M.D.   On: 07/22/2023 20:47   DG Chest 2 View Result Date: 07/22/2023 CLINICAL DATA:  Shortness of breath. EXAM: CHEST - 2 VIEW COMPARISON:  05/07/2022. FINDINGS: Bilateral lungs appear hyperexpanded and hyperlucent with coarse bronchovascular markings, in keeping with COPD. Bilateral lungs otherwise appear clear. No dense consolidation or lung collapse. Bilateral costophrenic angles are clear. Normal cardio-mediastinal silhouette. No acute osseous abnormalities. The soft tissues are within normal limits. IMPRESSION: *No active cardiopulmonary disease. COPD. Electronically Signed   By: Beula Brunswick M.D.   On: 07/22/2023 16:40    Microbiology: Results for orders placed or performed during the hospital encounter of 04/29/22  Culture, blood (Routine x 2)     Status: None   Collection Time: 04/29/22  3:43 PM   Specimen: BLOOD  Result Value Ref Range Status   Specimen Description   Final    BLOOD RIGHT ANTECUBITAL Performed at [facility], [address] [place], [place] [address]    Special Requests   Final    BOTTLES DRAWN AEROBIC AND ANAEROBIC Blood Culture adequate volume Performed at [facility], [address], [place], [place] [address]    Culture   Final    NO GROWTH 5 DAYS Performed at [facility], [address], [place], [place] [address]    Report Status 05/04/2022 FINAL  Final  Resp panel by RT-PCR (RSV, Flu A&B, Covid)  Anterior Nasal Swab     Status: None   Collection Time: 04/29/22  3:43 PM   Specimen: Anterior Nasal Swab  Result Value Ref Range Status   SARS Coronavirus 2 by RT PCR NEGATIVE NEGATIVE Final    Comment: (NOTE) SARS-CoV-2 target nucleic acids are NOT DETECTED.  The SARS-CoV-2 RNA is generally detectable in upper respiratory specimens during the acute phase of infection. The lowest concentration of SARS-CoV-2 viral copies this assay can detect is 138 copies/mL. A negative result does not preclude SARS-Cov-2 infection and should not be used as the sole basis for treatment or other patient management decisions. A negative result may occur with  improper specimen collection/handling, submission of specimen other than nasopharyngeal swab, presence of viral mutation(s) within the areas targeted by this assay, and inadequate number of viral copies(<138 copies/mL). A negative result must be combined with clinical observations, patient history, and epidemiological information. The expected result is Negative.  Fact Sheet for Patients:  BloggerCourse.com  Fact Sheet for Healthcare Providers:  SeriousBroker.it  This test is no t yet approved or cleared by the [place]  FDA  and  has been authorized for detection and/or diagnosis of SARS-CoV-2 by FDA under an Emergency Use Authorization (EUA). This EUA will remain  in effect (meaning this test can be used) for the duration of the COVID-19 declaration under Section 564(b)(1) of the Act, 21 U.S.C.section 360bbb-3(b)(1), unless the authorization is terminated  or revoked sooner.       Influenza A by PCR NEGATIVE NEGATIVE Final   Influenza B by PCR NEGATIVE NEGATIVE Final    Comment: (NOTE) The Xpert Xpress SARS-CoV-2/FLU/RSV plus assay is intended as an aid in the diagnosis of influenza from Nasopharyngeal swab specimens and should not be used as a sole basis for treatment. Nasal washings  and aspirates are unacceptable for Xpert Xpress SARS-CoV-2/FLU/RSV testing.  Fact Sheet for Patients: BloggerCourse.com  Fact Sheet for Healthcare Providers: SeriousBroker.it  This test is not yet approved or cleared by the [place]  FDA and has been authorized for detection and/or diagnosis of SARS-CoV-2 by FDA under an Emergency Use Authorization (EUA). This EUA will remain in effect (meaning this test can be used) for the duration of the COVID-19 declaration under Section 564(b)(1) of the Act, 21 U.S.C. section 360bbb-3(b)(1), unless the authorization is terminated or revoked.     Resp Syncytial Virus by PCR NEGATIVE NEGATIVE Final    Comment: (NOTE) Fact Sheet for Patients: BloggerCourse.com  Fact Sheet for Healthcare Providers: SeriousBroker.it  This test is not yet approved or cleared by the [place]  FDA and has been authorized for detection and/or diagnosis of SARS-CoV-2 by FDA under an Emergency Use Authorization (EUA). This EUA will remain in effect (meaning this test can be used) for the duration of the COVID-19 declaration under Section 564(b)(1) of the Act, 21 U.S.C. section 360bbb-3(b)(1), unless the authorization is terminated or revoked.  Performed at [facility], [address] [place], [place] [address]   Culture, blood (Routine x 2)     Status: Abnormal   Collection Time: 04/29/22  3:48 PM   Specimen: BLOOD LEFT HAND  Result Value Ref Range Status   Specimen Description   Final    BLOOD LEFT HAND Performed at [facility], [address], [place], [place] [address]    Special Requests   Final    BOTTLES DRAWN AEROBIC AND ANAEROBIC Blood Culture adequate volume Performed at [facility], [address], [place], [place] [address]    Culture  Setup Time   Final    GRAM POSITIVE COCCI IN  CLUSTERS ANAEROBIC BOTTLE ONLY CRITICAL RESULT CALLED TO, READ BACK BY AND VERIFIED WITH: PHARMD LEANN POINDEXTER ON 04/30/22 @ 2236 BY DRT    Culture (A)  Final    STAPHYLOCOCCUS EPIDERMIDIS THE SIGNIFICANCE OF ISOLATING THIS ORGANISM FROM A SINGLE SET OF BLOOD CULTURES WHEN MULTIPLE SETS ARE DRAWN IS UNCERTAIN. PLEASE NOTIFY THE MICROBIOLOGY DEPARTMENT WITHIN ONE WEEK IF SPECIATION AND SENSITIVITIES ARE REQUIRED. Performed at [facility], [address] [place], [place] [address]    Report Status 05/02/2022 FINAL  Final  Blood Culture ID Panel (Reflexed)     Status: Abnormal   Collection Time: 04/29/22  3:48 PM  Result Value Ref Range Status   Enterococcus faecalis NOT DETECTED NOT DETECTED Final   Enterococcus Faecium NOT DETECTED NOT DETECTED Final   Listeria monocytogenes NOT DETECTED NOT DETECTED Final   Staphylococcus species DETECTED (A) NOT DETECTED Final    Comment: CRITICAL RESULT CALLED TO, READ BACK BY AND VERIFIED WITH: PHARMD LEANN POINDEXTER ON  04/30/22 @ 2236 BY DRT    Staphylococcus aureus (BCID) NOT DETECTED NOT DETECTED Final   Staphylococcus epidermidis DETECTED (A) NOT DETECTED Final    Comment: CRITICAL RESULT CALLED TO, READ BACK BY AND VERIFIED WITH: PHARMD LEANN POINDEXTER ON 04/30/22 @ 2236 BY DRT    Staphylococcus lugdunensis NOT DETECTED NOT DETECTED Final   Streptococcus species NOT DETECTED NOT DETECTED Final   Streptococcus agalactiae NOT DETECTED NOT DETECTED Final   Streptococcus pneumoniae NOT DETECTED NOT DETECTED Final   Streptococcus pyogenes NOT DETECTED NOT DETECTED Final   A.calcoaceticus-baumannii NOT DETECTED NOT DETECTED Final   Bacteroides fragilis NOT DETECTED NOT DETECTED Final   Enterobacterales NOT DETECTED NOT DETECTED Final   Enterobacter cloacae complex NOT DETECTED NOT DETECTED Final   Escherichia coli NOT DETECTED NOT DETECTED Final   Klebsiella aerogenes NOT DETECTED NOT DETECTED Final   Klebsiella oxytoca NOT DETECTED  NOT DETECTED Final   Klebsiella pneumoniae NOT DETECTED NOT DETECTED Final   Proteus species NOT DETECTED NOT DETECTED Final   Salmonella species NOT DETECTED NOT DETECTED Final   Serratia marcescens NOT DETECTED NOT DETECTED Final   Haemophilus influenzae NOT DETECTED NOT DETECTED Final   Neisseria meningitidis NOT DETECTED NOT DETECTED Final   Pseudomonas aeruginosa NOT DETECTED NOT DETECTED Final   Stenotrophomonas maltophilia NOT DETECTED NOT DETECTED Final   Candida albicans NOT DETECTED NOT DETECTED Final   Candida auris NOT DETECTED NOT DETECTED Final   Candida glabrata NOT DETECTED NOT DETECTED Final   Candida krusei NOT DETECTED NOT DETECTED Final   Candida parapsilosis NOT DETECTED NOT DETECTED Final   Candida tropicalis NOT DETECTED NOT DETECTED Final   Cryptococcus neoformans/gattii NOT DETECTED NOT DETECTED Final   Methicillin resistance mecA/C NOT DETECTED NOT DETECTED Final    Comment: Performed at [facility], [address], [place], [place] [address]  Respiratory (~20 pathogens) panel by PCR     Status: Abnormal   Collection Time: 04/29/22  9:08 PM   Specimen: Nasopharyngeal Swab; Respiratory  Result Value Ref Range Status   Adenovirus NOT DETECTED NOT DETECTED Final   Coronavirus 229E NOT DETECTED NOT DETECTED Final    Comment: (NOTE) The Coronavirus on the Respiratory Panel, DOES NOT test for the novel  Coronavirus (2019 nCoV)    Coronavirus HKU1 NOT DETECTED NOT DETECTED Final   Coronavirus NL63 NOT DETECTED NOT DETECTED Final   Coronavirus OC43 NOT DETECTED NOT DETECTED Final   Metapneumovirus NOT DETECTED NOT DETECTED Final   Rhinovirus / Enterovirus NOT DETECTED NOT DETECTED Final   Influenza A NOT DETECTED NOT DETECTED Final   Influenza B NOT DETECTED NOT DETECTED Final   Parainfluenza Virus 1 NOT DETECTED NOT DETECTED Final   Parainfluenza Virus 2 NOT DETECTED NOT DETECTED Final   Parainfluenza Virus 3 DETECTED (A) NOT DETECTED Final    Parainfluenza Virus 4 NOT DETECTED NOT DETECTED Final   Respiratory Syncytial Virus NOT DETECTED NOT DETECTED Final   Bordetella pertussis NOT DETECTED NOT DETECTED Final   Bordetella Parapertussis NOT DETECTED NOT DETECTED Final   Chlamydophila pneumoniae NOT DETECTED NOT DETECTED Final   Mycoplasma pneumoniae NOT DETECTED NOT DETECTED Final    Comment: Performed at [facility], [address], [place], [place] [address]  Expectorated Sputum Assessment w Gram Stain, Rflx to Resp Cult     Status: None   Collection Time: 04/30/22  8:37 AM   Specimen: Sputum  Result Value Ref Range Status   Specimen Description SPUTUM  Final  Special Requests NONE  Final   Sputum evaluation   Final    Sputum specimen not acceptable for testing.  Please recollect.   Performed at [facility], [address] [place], [place] [address]    Report Status 04/30/2022 FINAL  Final    Labs: CBC: Recent Labs  Lab 08/12/23 1538 08/12/23 1656 08/12/23 2312 08/13/23 0500  WBC 7.2  --  6.0 6.3  NEUTROABS 5.2  --   --   --   HGB 15.0 16.0 14.9 14.2  HCT 44.9 47.0 43.2 41.5  MCV 101.4*  --  99.3 98.3  PLT 146*  --  153 157   Basic Metabolic Panel: Recent Labs  Lab 08/12/23 1538 08/12/23 1656 08/12/23 2312 08/13/23 0500  NA 135 135  --  130*  K 4.0 3.9  --  4.5  CL 99 98  --  99  CO2 21*  --   --  24  GLUCOSE 90 92  --  218*  BUN 16 18  --  17  CREATININE 0.91 0.90 1.06 0.96  CALCIUM  8.8*  --   --  8.5*   Liver Function Tests: Recent Labs  Lab 08/12/23 1538 08/13/23 0500  AST 20 17  ALT 22 18  ALKPHOS 62 83  BILITOT 0.9 0.6  PROT 6.8 6.1*  ALBUMIN 3.5 2.8*   CBG: No results for input(s): GLUCAP in the last 168 hours.   Time spent on smoking cessation counseling . Discharge time spent: 37 minutes.  Signed: Aisha Hove, MD [facility] 08/14/2023

## 2023-08-14 NOTE — Plan of Care (Signed)

## 2023-08-14 NOTE — Plan of Care (Signed)

## 2023-08-28 ENCOUNTER — Ambulatory Visit
Admission: RE | Admit: 2023-08-28 | Discharge: 2023-08-28 | Disposition: A | Discharge status: Home / Self Care | Source: Ambulatory Visit | Attending: Family Medicine | Admitting: Family Medicine

## 2023-08-28 DIAGNOSIS — Z8619 Personal history of other infectious and parasitic diseases: Secondary | ICD-10-CM

## 2023-09-18 ENCOUNTER — Ambulatory Visit: Admitting: Student

## 2023-09-23 NOTE — Progress Notes (Unsigned)
 Cardiology Office Note:    Date:  09/24/2023   ID:  Wenzel Backlund, DOB Jul 01, 1950, MRN [id number]  PCP:  Markham Sailor, MD    [facility] Providers Cardiologist:  Peter Swaziland, MD Cardiology APP:  Madie Jon Garre, PA     Referring MD: Markham Sailor, MD   Chief Complaint  Patient presents with   Follow-up    CAD, ICM    History of Present Illness:    Hunter Mcmillan is a 73 y.o. male with a hx of CAD s/p DES x 2 - RCA 04/2019, ischemic cardiomyopathy with LVEF 30-35%, CVA, COPD, hypertension, hyperlipidemia, and tobacco abuse.  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  He was managed medically at that time.  In 04/2019 he was admitted with STEMI found to have 100% occlusion of the proximal RCA and heavy thrombus in the LCx.  He was treated with PCI/DES x 2 to RCA and LCx lesion was treated medically.  Echo at that time showed an LVEF 40-45%.  He underwent relook cath the next day for recurrent chest pain that showed patent stents.  He was started on colchicine  for possible pericarditis.  Repeat heart catheterization 05/2018 with patent overlapping stents in the RCA, 40% ostial left main, and 30% mid LAD stenosis, medical therapy pursued.  Echo at that time showed an LVEF 30-35% with akinesis of the basal to mid inferior and inferolateral walls and grade 2 diastolic dysfunction.  GDMT titrated but he was lost to follow-up.  He was hospitalized 07/2023 with COPD exacerbation.  He was seen in follow-up on 08/12/2023 and was unsure what medications he was taking.  His PCP had placed a heart monitor for possible arrhythmia, patient denied palpitations.  He reported increasing shortness of breath, but appeared euvolemic on exam.  He was hypoxic to 85% on room air.  He was sent to the ER for further evaluation.  He was admitted and treated for COPD exacerbation.  He presents today for cardiology follow-up and clearance for  drug rehab. He has baseline SOB from COPD, breathing is at baseline. No orthopnea, LE swelling, CP, or syncope.   Past Medical History:  Diagnosis Date   Abdominal pain 05/02/2019   Alcoholism (HCC)    CAD (coronary artery disease)    a. 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 s/p [name] ~ 2003 in [place], [place] => stopped after 6 mos due to neutropenia - viral load noted to be low at that time;  no follow up since;   b. abdominal u/s 11/13:  diffuse hepatic steatosis and/or hepatocellular disease     Hx of echocardiogram    a. Echo 01/06/12: Mild focal basal hypertrophy the septum, vigorous LV, EF 65-70%, Gr 2 diast dysfn, trivial AI, MAC, trivial MR, trivial TR, PASP 36   Hyperlipidemia    Hypertension  Non-STEMI (non-ST elevated myocardial infarction) (HCC) 2019   Stroke [facility]) 2019   minor pt reported   Thrombocytopenia (HCC)    Tobacco abuse     Past Surgical History:  Procedure Laterality Date   CARPAL TUNNEL RELEASE Right 10/30/2020   Procedure: CARPAL TUNNEL RELEASE;  Surgeon: Beverley Evalene BIRCH, MD;  Location: [facility] ORS;  Service: Orthopedics;  Laterality: Right;   CORONARY ANGIOGRAPHY N/A 04/24/2019   Procedure: CORONARY ANGIOGRAPHY;  Surgeon: Swaziland, Peter M, MD;  Location: [facility] INVASIVE CV LAB;  Service: Cardiovascular;  Laterality: N/A;   CORONARY/GRAFT ACUTE MI REVASCULARIZATION N/A 04/24/2019   Procedure: Coronary/Graft Acute MI Revascularization;  Surgeon: Swaziland, Peter M, MD;  Location: [facility]  INVASIVE CV LAB;  Service: Cardiovascular;  Laterality: N/A;   ERCP N/A 05/13/2019   Procedure: ENDOSCOPIC RETROGRADE CHOLANGIOPANCREATOGRAPHY (ERCP);  Surgeon: Saintclair Jasper, MD;  Location: [facility] ENDOSCOPY;  Service: Gastroenterology;  Laterality: N/A;   EYE SURGERY  1954   corrective surgery for crossed eyes   EYE SURGERY  1959   LEFT HEART CATH AND CORONARY ANGIOGRAPHY N/A 04/24/2019   Procedure: LEFT HEART CATH AND CORONARY ANGIOGRAPHY;  Surgeon: Swaziland, Peter M, MD;  Location: [facility] INVASIVE CV LAB;  Service: Cardiovascular;  Laterality: N/A;   LEFT HEART CATH AND CORONARY ANGIOGRAPHY N/A 05/28/2020   Procedure: LEFT HEART CATH AND CORONARY ANGIOGRAPHY;  Surgeon: Verlin Lonni BIRCH, MD;  Location: [facility] INVASIVE CV LAB;  Service: Cardiovascular;  Laterality: N/A;   LEFT HEART CATHETERIZATION WITH CORONARY ANGIOGRAM N/A 01/05/2012   Procedure: LEFT HEART CATHETERIZATION WITH CORONARY ANGIOGRAM;  Surgeon: Ezra GORMAN Shuck, MD;  Location: [facility] CATH LAB;  Service: Cardiovascular;  Laterality: N/A;   ORIF WRIST FRACTURE Right 10/30/2020   Procedure: OPEN REDUCTION INTERNAL FIXATION (ORIF) WRIST FRACTURE;  Surgeon: Beverley Evalene BIRCH, MD;  Location: [facility] ORS;  Service: Orthopedics;  Laterality: Right;   REMOVAL OF STONES  05/13/2019   Procedure: REMOVAL OF STONES;  Surgeon: Saintclair Jasper, MD;  Location: [facility] ENDOSCOPY;  Service: Gastroenterology;;   ANNETT  05/13/2019   Procedure: SPHINCTEROTOMY;  Surgeon: Saintclair Jasper, MD;  Location: [facility] ENDOSCOPY;  Service: Gastroenterology;;    Current Medications: Current Meds  Medication Sig   acetaminophen  (TYLENOL ) 500 MG tablet Take 1 tablet (500 mg total) by mouth every 6 (six) hours as needed for mild pain, fever or headache. (Patient taking differently: Take 500 mg by mouth in the morning and at bedtime.)   albuterol  (VENTOLIN  HFA) 108 (90 Base) MCG/ACT inhaler Inhale 2 puffs into the lungs every 6 (six) hours as needed for wheezing or shortness of breath.    aspirin  EC 81 MG tablet Take 81 mg by mouth daily at 6 PM.   atorvastatin  (LIPITOR ) 80 MG tablet Take 1 tablet (80 mg total) by mouth daily at 6 PM.   carvedilol  (COREG ) 6.25 MG tablet Take 1 tablet (6.25 mg total) by mouth 2 (two) times daily.   citalopram  (CELEXA ) 20 MG tablet Take 20 mg by mouth daily.   fluticasone  (FLONASE ) 50 MCG/ACT nasal spray Place 2 sprays into both nostrils daily.   guaiFENesin  (ROBITUSSIN) 100 MG/5ML liquid Take 5 mLs by mouth every 4 (four) hours as needed for cough or to loosen phlegm.   loratadine  (CLARITIN ) 10 MG tablet Take 10 mg by mouth daily.   mometasone -formoterol  (DULERA) 100-5 MCG/ACT AERO Inhale 2 puffs into the lungs 2 (two) times daily.   Multiple Vitamin (MULTIVITAMIN WITH MINERALS) TABS tablet Take 1 tablet by mouth daily at 6 PM.  nitroGLYCERIN  (NITROSTAT ) 0.4 MG SL tablet Place 1 tablet (0.4 mg total) under the tongue every 5 (five) minutes as needed for chest pain.   tamsulosin  (FLOMAX ) 0.4 MG CAPS capsule Take 1 capsule (0.4 mg total) by mouth daily. (Patient taking differently: Take 0.4 mg by mouth every evening.)     Allergies:   Patient has no known allergies.   Social History   Socioeconomic History   Marital status: Single    Spouse name: Not on file   Number of children: 1   Years of education: Not on file   Highest education level: Bachelor's degree (e.g., BA, AB, BS)  Occupational History   Occupation: Sports administrator: [employer]    Comment: retired  Tobacco Use   Smoking status: Every Day    Current packs/day: 0.10    Average packs/day: 0.1 packs/day for 55.0 years (5.5 ttl pk-yrs)    Types: Cigarettes   Smokeless tobacco: Never   Tobacco comments:    08/12/2023 Patient smokes 3-4 cigarettes daily    almost quit, very rare, 1-2 daily  Vaping Use   Vaping status: Never Used  Substance and Sexual Activity   Alcohol use: Not Currently    Alcohol/week: 1.0 standard drink of alcohol    Types: 1 Standard  drinks or equivalent per week   Drug use: Not Currently    Types: Cocaine    Comment: history of cocaine last use 2014   Sexual activity: Yes    Partners: Female    Comment: pt. declined condoms  Other Topics Concern   Not on file  Social History Narrative   Not on file   Social Drivers of Health   Financial Resource Strain: Not on file  Food Insecurity: No Food Insecurity (08/12/2023)   Hunger Vital Sign    Worried About Running Out of Food in the Last Year: Never true    Ran Out of Food in the Last Year: Never true  Transportation Needs: No Transportation Needs (08/12/2023)   PRAPARE - Administrator, Civil Service (Medical): No    Lack of Transportation (Non-Medical): No  Physical Activity: Not on file  Stress: Not on file  Social Connections: Unknown (08/12/2023)   Social Connection and Isolation Panel    Frequency of Communication with Friends and Family: Patient unable to answer    Frequency of Social Gatherings with Friends and Family: Patient declined    Attends Religious Services: Patient declined    Database administrator or Organizations: No    Attends Engineer, structural: Never    Marital Status: Never married     Family History: The patient's family history includes Depression in his brother; Diabetes in his brother, maternal grandfather, and mother; Early death in his maternal grandfather and maternal grandmother; Hearing loss in his sister and sister; Heart attack in his maternal grandfather and maternal grandmother; Heart attack (age of onset: 30) in his mother; Heart disease in his maternal grandfather, maternal grandmother, and mother; Hyperlipidemia in his brother, father, and sister; Hypertension in his father.  ROS:   Please see the history of present illness.     All other systems reviewed and are negative.  EKGs/Labs/Other Studies Reviewed:    The following studies were reviewed today:  Cardiac Studies & Procedures    ______________________________________________________________________________________________ CARDIAC CATHETERIZATION  CARDIAC CATHETERIZATION 05/28/2020  Conclusion  Ost LM lesion is 40% stenosed.  Mid LAD lesion is 30% stenosed.  Prox Cx lesion is 90%  stenosed.  Prox RCA to Mid RCA lesion is 10% stenosed.  1. Large, dominant RCA with patent overlapping stents in the mid vessel with minimal restenosis 2. 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 The Circumflex is small in caliber and has an unchanged severe stenosis. Vessel is too small for PCI 3. There is an eccentric, mild to moderate mid left main stenosis. This lesion appears to be eccentric and is unchanged from his last cath in 2021.  Recommendations: Continue medical management of CAD  Findings Coronary Findings Diagnostic  Dominance: Right  Left Main Ost LM lesion is 40% stenosed. The lesion is eccentric.  Left Anterior Descending Vessel is large. Mid LAD lesion is 30% stenosed.  Left Circumflex Vessel is small. Prox Cx lesion is 90% stenosed.  Right Coronary Artery Prox RCA to Mid RCA lesion is 10% stenosed. The lesion was previously treated using a drug eluting stent between 1-2 years ago.  Right Posterior Descending Artery Collaterals RPDA filled by collaterals from Dist LAD.  Intervention  No interventions have been documented.   CARDIAC CATHETERIZATION  CARDIAC CATHETERIZATION 04/24/2019  Conclusion  Prox Cx lesion is 90% stenosed.  Previously placed Prox RCA to Mid RCA stent (unknown type) is widely patent.  1. Continued excellent patency of the RCA stents. The LCx stenosis is chronic and this is a small vessel.  Plan: continue medical management. Will remove femoral sheath manually. Will add colchicine  for possible pericardial component to pain. Prn analgesics.  Findings Coronary Findings Diagnostic  Dominance: Right  Left Circumflex Vessel is small. Prox Cx  lesion is 90% stenosed.  Right Coronary Artery Previously placed Prox RCA to Mid RCA stent (unknown type) is widely patent.  Intervention  No interventions have been documented.     ECHOCARDIOGRAM  ECHOCARDIOGRAM COMPLETE 05/28/2020  Narrative ECHOCARDIOGRAM REPORT    Patient Name:   JAHIR HALT Date of Exam: 05/28/2020 Medical Rec #:  [id number]        Height:       71.0 in Accession #:    [id number]       Weight:       144.4 lb Date of Birth:  April 14, 1950        BSA:          1.836 m Patient Age:    70 years         BP:           108/75 mmHg Patient Gender: M                HR:           69 bpm. Exam Location:  Inpatient  Procedure: 2D Echo, Cardiac Doppler and Color Doppler  Indications:    R07.9* Chest pain, unspecified  History:        Patient has prior history of Echocardiogram examinations, most recent 04/24/2019. Previous Myocardial Infarction and CAD, Stroke; Risk Factors:Current Smoker, Hypertension, Dyslipidemia and ETOH.  Sonographer:    Lauraine Pilot RDCS Referring Phys: [id number] ALEENE JERNIGAN  IMPRESSIONS   1. Left ventricular ejection fraction, by estimation, is 30 to 35%. The left ventricle has moderately decreased function. The left ventricle demonstrates regional wall motion abnormalities with basal to mid inferior and inferolateral akinesis. Left ventricular diastolic parameters are consistent with Grade II diastolic dysfunction (pseudonormalization). 2. Right ventricular systolic function is mildly reduced. The right ventricular size is normal. Tricuspid regurgitation signal is inadequate for assessing PA pressure. 3. The mitral valve is normal in  structure. Trivial mitral valve regurgitation. No evidence of mitral stenosis. 4. The aortic valve is tricuspid. Aortic valve regurgitation is not visualized. Mild to moderate aortic valve sclerosis/calcification is present, without any evidence of aortic stenosis. 5. The inferior vena cava is normal  in size with <50% respiratory variability, suggesting right atrial pressure of 8 mmHg.  FINDINGS Left Ventricle: Left ventricular ejection fraction, by estimation, is 30 to 35%. The left ventricle has moderately decreased function. The left ventricle demonstrates regional wall motion abnormalities. The left ventricular internal cavity size was normal in size. There is no left ventricular hypertrophy. Left ventricular diastolic parameters are consistent with Grade II diastolic dysfunction (pseudonormalization).  Right Ventricle: The right ventricular size is normal. No increase in right ventricular wall thickness. Right ventricular systolic function is mildly reduced. Tricuspid regurgitation signal is inadequate for assessing PA pressure.  Left Atrium: Left atrial size was normal in size.  Right Atrium: Right atrial size was normal in size.  Pericardium: There is no evidence of pericardial effusion.  Mitral Valve: The mitral valve is normal in structure. Mild mitral annular calcification. Trivial mitral valve regurgitation. No evidence of mitral valve stenosis.  Tricuspid Valve: The tricuspid valve is normal in structure. Tricuspid valve regurgitation is trivial.  Aortic Valve: The aortic valve is tricuspid. Aortic valve regurgitation is not visualized. Mild to moderate aortic valve sclerosis/calcification is present, without any evidence of aortic stenosis.  Pulmonic Valve: The pulmonic valve was normal in structure. Pulmonic valve regurgitation is not visualized.  Aorta: The aortic root is normal in size and structure.  Venous: The inferior vena cava is normal in size with less than 50% respiratory variability, suggesting right atrial pressure of 8 mmHg.  IAS/Shunts: No atrial level shunt detected by color flow Doppler.   LEFT VENTRICLE PLAX 2D LVIDd:         5.30 cm      Diastology LVIDs:         4.20 cm      LV e' medial:    6.00 cm/s LV PW:         1.00 cm      LV E/e' medial:   14.4 LV IVS:        1.00 cm      LV e' lateral:   8.43 cm/s LVOT diam:     2.20 cm      LV E/e' lateral: 10.2 LV SV:         47 LV SV Index:   26 LVOT Area:     3.80 cm  LV Volumes (MOD) LV vol d, MOD A2C: 170.0 ml LV vol d, MOD A4C: 101.0 ml LV vol s, MOD A2C: 104.0 ml LV vol s, MOD A4C: 62.2 ml LV SV MOD A2C:     66.0 ml LV SV MOD A4C:     101.0 ml LV SV MOD BP:      52.2 ml  RIGHT VENTRICLE RV S prime:     7.68 cm/s TAPSE (M-mode): 0.9 cm  LEFT ATRIUM             Index       RIGHT ATRIUM           Index LA diam:        3.00 cm 1.63 cm/m  RA Area:     13.30 cm LA Vol (A2C):   39.7 ml 21.62 ml/m RA Volume:   31.10 ml  16.94 ml/m LA Vol (A4C):   33.1 ml 18.03  ml/m LA Biplane Vol: 35.9 ml 19.55 ml/m AORTIC VALVE LVOT Vmax:   62.50 cm/s LVOT Vmean:  50.600 cm/s LVOT VTI:    0.124 m  AORTA Ao Root diam: 3.50 cm  MITRAL VALVE MV Area (PHT): 3.74 cm    SHUNTS MV Decel Time: 203 msec    Systemic VTI:  0.12 m MV E velocity: 86.10 cm/s  Systemic Diam: 2.20 cm MV A velocity: 42.40 cm/s MV E/A ratio:  2.03  Ezra Shuck MD Electronically signed by Ezra Shuck MD Signature Date/Time: 05/28/2020/3:29:56 PM    Final          ______________________________________________________________________________________________        EKG Interpretation Date/Time:  Thursday September 24 2023 11:38:50 EDT Ventricular Rate:  57 PR Interval:  146 QRS Duration:  112 QT Interval:  476 QTC Calculation: 463 R Axis:   14  Text Interpretation: Sinus bradycardia Inferior infarct , age undetermined When compared with ECG of 12-Aug-2023 15:32, PREVIOUS ECG IS PRESENT Confirmed by Madie Slough ([id number]) on 09/24/2023 11:43:20 AM    Recent Labs: 08/12/2023: B Natriuretic Peptide 382.5 08/13/2023: ALT 18; BUN 17; Creatinine, Ser 0.96; Hemoglobin 14.2; Platelets 157; Potassium 4.5; Sodium 130  Recent Lipid Panel    Component Value Date/Time   CHOL 71 05/27/2020 0339   CHOL 186  10/24/2019 1450   TRIG 36 05/27/2020 0339   HDL 30 (L) 05/27/2020 0339   HDL 48 10/24/2019 1450   CHOLHDL 2.4 05/27/2020 0339   VLDL 7 05/27/2020 0339   LDLCALC 34 05/27/2020 0339   LDLCALC 108 (H) 10/24/2019 1450     Risk Assessment/Calculations:                Physical Exam:    VS:  BP 130/80 (BP Location: Left Arm, Patient Position: Sitting)   Pulse 62   Ht 5' 11 (1.803 m)   Wt 170 lb (77.1 kg)   SpO2 94%   BMI 23.71 kg/m     Wt Readings from Last 3 Encounters:  09/24/23 170 lb (77.1 kg)  08/12/23 165 lb (74.8 kg)  08/12/23 168 lb 6.4 oz (76.4 kg)     GEN:  Well nourished, well developed in no acute distress HEENT: Normal NECK: No JVD; No carotid bruits LYMPHATICS: No lymphadenopathy CARDIAC: RRR, no murmurs, rubs, gallops RESPIRATORY:  Clear to auscultation without rales, wheezing or rhonchi  ABDOMEN: Soft, non-tender, non-distended MUSCULOSKELETAL:  No edema; No deformity  SKIN: Warm and dry NEUROLOGIC:  Alert and oriented x 3 PSYCHIATRIC:  Normal affect   ASSESSMENT:    1. Ischemic cardiomyopathy   2. Chronic combined systolic and diastolic CHF (congestive heart failure) (HCC)   3. CAD S/P percutaneous coronary angioplasty   4. Primary hypertension   5. Hyperlipidemia with target LDL less than 70   6. Chronic obstructive pulmonary disease, unspecified COPD type (HCC)   7. Alcoholism (HCC)   8. Tobacco abuse    PLAN:    In order of problems listed above:   CAD STEMI 04/2019 s/p DES x 2-RCA -Left main disease, nonobstructive -Continue aspirin , 6.25 mg carvedilol  twice daily -- I have restarted ASA EC daily for prior stents   Ischemic cardiomyopathy - Last LVEF 30-35% with WMA, grade 2 DD -Was not discharged on Lasix  after most recent hospitalization - was taking lasix  20 mg lasix  at one time, but not in the last year (was incarcerated) -GDMT currently includes only carvedilol  6.25 mg twice daily -- he has not taken lasix  in over  1 year --  discussed the importance of daily weights and low sodium diet to not exceed 1800 mg sodium daily -- will provide 20 mg lasix  to use PRN for 3 lb weight gain overnight or 5 lb weight gain in 1 week, new orthopnea, LE edema or SOB -- will plan for repeat echo once discharged from rehab   Hyperlipidemia with LDL goal less than 55 -Continue 80 mg Lipitor  - will collect labs at next visit   Tobacco abuse Alcohol use - Cessation recommended for both -- he requests patches - will prescribe 7 mg nicotine  patches - he is only smoking 3-4 cigarettes daily -- he also requests help with drug and alcohol rehab program -- he requests inpatient rehab at [facility] and needs clearance from cardiology   Unhoused On probation from recent incarceration Clearance for drug and alcohol program EKG today stable from prior tracings He has a history of CAD and ICM. He is high risk for readmission, but has no unstable cardiac conditions currently. He may proceed to inpatient drug rehab. He needs to continue compliance with ASA and coreg . He will have PRN lasix  20 mg for edema and weight gain, as above. We have provided a scale for him to use in rehab, he will need a weight log.   He should stay on a sodium restricted diet - less than 1800 mg sodium daily.   Follow up in 6 months.          Medication Adjustments/Labs and Tests Ordered: Current medicines are reviewed at length with the patient today.  Concerns regarding medicines are outlined above.  Orders Placed This Encounter  Procedures   EKG 12-Lead   No orders of the defined types were placed in this encounter.   Patient Instructions  Medication Instructions:  Your physician has recommended you make the following change in your medication:  START ASPIRIN  81 MG EC DAILY   START LASIX  20 MG AS NEEDED FOR WEIGHT GAIN OF 3 LBS IN 1 DAY OR 5 LBS IN 1 WEEK. 3.  START NICOTINE  PATCHES 7 MG DAILY. PLACE 1 PATCH ONTO SKIN DAILY.  *If you need a refill on  your cardiac medications before your next appointment, please call your pharmacy*  Lab Work: NONE If you have labs (blood work) drawn today and your tests are completely normal, you will receive your results only by: MyChart Message (if you have MyChart) OR A paper copy in the mail If you have any lab test that is abnormal or we need to change your treatment, we will call you to review the results.  Testing/Procedures: NONE  Follow-Up: At [facility], you and your health needs are our priority.  As part of our continuing mission to provide you with exceptional heart care, our providers are all part of one team.  This team includes your primary Cardiologist (physician) and Advanced Practice Providers or APPs (Physician Assistants and Nurse Practitioners) who all work together to provide you with the care you need, when you need it.  Your next appointment:   6 month(s)  Provider:   Peter Swaziland, MD or Jon Hails, PA-C  We recommend signing up for the patient portal called MyChart.  Sign up information is provided on this After Visit Summary.  MyChart is used to connect with patients for Virtual Visits (Telemedicine).  Patients are able to view lab/test results, encounter notes, upcoming appointments, etc.  Non-urgent messages can be sent to your provider as well.   To learn  more about what you can do with MyChart, go to [email or web address].          Signed, Jon Nat Hails, [place]  09/24/2023 11:46 AM    [facility]

## 2023-09-24 ENCOUNTER — Encounter: Payer: Self-pay | Admitting: Physician Assistant

## 2023-09-24 ENCOUNTER — Ambulatory Visit: Attending: Physician Assistant | Admitting: Physician Assistant

## 2023-09-24 VITALS — BP 130/80 | HR 62 | Ht 71.0 in | Wt 170.0 lb

## 2023-09-24 DIAGNOSIS — F1020 Alcohol dependence, uncomplicated: Secondary | ICD-10-CM | POA: Diagnosis present

## 2023-09-24 DIAGNOSIS — Z9861 Coronary angioplasty status: Secondary | ICD-10-CM | POA: Insufficient documentation

## 2023-09-24 DIAGNOSIS — E785 Hyperlipidemia, unspecified: Secondary | ICD-10-CM | POA: Diagnosis present

## 2023-09-24 DIAGNOSIS — Z720 Tobacco use: Secondary | ICD-10-CM | POA: Insufficient documentation

## 2023-09-24 DIAGNOSIS — J449 Chronic obstructive pulmonary disease, unspecified: Secondary | ICD-10-CM | POA: Insufficient documentation

## 2023-09-24 DIAGNOSIS — I5042 Chronic combined systolic (congestive) and diastolic (congestive) heart failure: Secondary | ICD-10-CM | POA: Insufficient documentation

## 2023-09-24 DIAGNOSIS — I255 Ischemic cardiomyopathy: Secondary | ICD-10-CM | POA: Diagnosis not present

## 2023-09-24 DIAGNOSIS — I10 Essential (primary) hypertension: Secondary | ICD-10-CM | POA: Insufficient documentation

## 2023-09-24 DIAGNOSIS — I2510 Atherosclerotic heart disease of native coronary artery without angina pectoris: Secondary | ICD-10-CM | POA: Diagnosis present

## 2023-09-24 MED ORDER — NICOTINE 7 MG/24HR TD PT24: 7.0000 mg | MEDICATED_PATCH | Freq: Every day | TRANSDERMAL | 6 refills | Status: AC

## 2023-09-24 MED ORDER — FUROSEMIDE 20 MG PO TABS: 20.0000 mg | ORAL_TABLET | ORAL | 3 refills | Status: AC | PRN

## 2023-09-24 NOTE — Patient Instructions (Addendum)
 Medication Instructions:  Your physician has recommended you make the following change in your medication:  START LASIX  20 MG AS NEEDED FOR WEIGHT GAIN OF 3 LBS IN 1 DAY OR 5 LBS IN 1 WEEK.  2. START NICOTINE  PATCHES 7 MG DAILY. PLACE 1 PATCH ONTO SKIN DAILY .  *If you need a refill on your cardiac medications before your next appointment, please call your pharmacy*  Lab Work: NONE If you have labs (blood work) drawn today and your tests are completely normal, you will receive your results only by: MyChart Message (if you have MyChart) OR A paper copy in the mail If you have any lab test that is abnormal or we need to change your treatment, we will call you to review the results.  Testing/Procedures: NONE  Follow-Up: At [facility], you and your health needs are our priority.  As part of our continuing mission to provide you with exceptional heart care, our providers are all part of one team.  This team includes your primary Cardiologist (physician) and Advanced Practice Providers or APPs (Physician Assistants and Nurse Practitioners) who all work together to provide you with the care you need, when you need it.  Your next appointment:   6 month(s)  Provider:   Peter Swazil[place], MD or Jon Hails, PA-C  We recommend signing up for the patient portal called MyChart.  Sign up information is provided on this After Visit Summary.  MyChart is used to connect with patients for Virtual Visits (Telemedicine).  Patients are able to view lab/test results, encounter notes, upcoming appointments, etc.  Non-urgent messages can be sent to your provider as well.   To learn more about what you can do with MyChart, go to [email or web address].

## 2023-10-01 ENCOUNTER — Telehealth: Payer: Self-pay | Admitting: Licensed Clinical Social Worker

## 2023-10-01 NOTE — Progress Notes (Unsigned)
 Heart and Vascular Care Navigation  10/01/2023  Hunter Mcmillan November 13, 1950 [id number]  Reason for Referral:  Patient is participating in a Managed Medicaid Plan: No, Medicare and Medicaid   Engaged with patient by telephone for initial visit for Heart and Vascular Care Coordination.                                                                                                   Assessment:                                     LCSW received a call back from pt, uses his friend Neal's phone at this time. Introduced self, role, reason for call. Confirmed he would like to use [facility] address, but currently residing with friend in his garage post release from prison. He has access to food, utilities, and has no issues obtaining his medications at this time, or accessing rides. He shares he got his scale to work provided by the office, no additional concerns regarding that. He has plans to enroll with [facility] next week when bed opens and has letter of clearance from the provider. No additional questions/concerns at this time.   HRT/VAS Care Coordination     Patients Home Cardiology Office --  [facility]   Outpatient Care Team Social Worker   Social Worker Name: Marit Lark, [place], [phone]   Living arrangements for the past 2 months Single Family Home; Homeless   Lives with: Friends   Patient Current Insurance Coverage Traditional Medicare; Medicaid   Patient Has Concern With Paying Medical Bills No   Does Patient Have Prescription Coverage? Yes   Home Assistive Devices/Equipment None       Social History:                                                                             SDOH Screenings   Food Insecurity: No Food Insecurity (10/01/2023)  Housing: High Risk (10/01/2023)  Transportation Needs: No Transportation Needs (08/12/2023)  Utilities: Not At Risk (08/12/2023)  Financial Resource Strain: Low Risk  (10/01/2023)  Social Connections: Unknown  (08/12/2023)  Tobacco Use: High Risk (09/24/2023)  Health Literacy: Adequate Health Literacy (10/01/2023)    SDOH Interventions: Financial Resources:  Financial Strain Interventions: Intervention Not Indicated  Food Insecurity:  Food Insecurity Interventions: Intervention Not Indicated  Housing Insecurity:  Housing Interventions: Other (Comment), Intervention Not Indicated (currently residing with friend in garage, shares this is working fine with him for now)  Transportation:    No transportation issues at this time    Other Care Navigation Interventions:     Inpatient/Outpatient Substance Abuse Counseling/Rehab Options Pt plans to go to [facility] for inpatient treatment next week when bed available  Provided Pharmacy assistance resources  Pt denies any issues obtaining medications at this time  Patient expressed Mental Health concerns No.   Follow-up plan:   Confirmed DME working, plan to enroll with treatment program, has essentials thanks to support of friend at this time. I remain available as needed.

## 2023-10-28 ENCOUNTER — Ambulatory Visit: Admitting: Cardiology
